# Patient Record
Sex: Male | Born: 1950
Health system: Southern US, Community
[De-identification: ages and names within clinical notes are randomized; demographics above are authoritative.]

## PROBLEM LIST (undated history)

## (undated) DIAGNOSIS — K922 Gastrointestinal hemorrhage, unspecified: Secondary | ICD-10-CM

## (undated) DIAGNOSIS — I4891 Unspecified atrial fibrillation: Secondary | ICD-10-CM

## (undated) DIAGNOSIS — R42 Dizziness and giddiness: Secondary | ICD-10-CM

## (undated) DIAGNOSIS — D62 Acute posthemorrhagic anemia: Secondary | ICD-10-CM

## (undated) DIAGNOSIS — E7800 Pure hypercholesterolemia, unspecified: Secondary | ICD-10-CM

## (undated) DIAGNOSIS — D239 Other benign neoplasm of skin, unspecified: Secondary | ICD-10-CM

## (undated) DIAGNOSIS — M069 Rheumatoid arthritis, unspecified: Secondary | ICD-10-CM

## (undated) DIAGNOSIS — K219 Gastro-esophageal reflux disease without esophagitis: Secondary | ICD-10-CM

## (undated) DIAGNOSIS — R55 Syncope and collapse: Secondary | ICD-10-CM

## (undated) DIAGNOSIS — K5730 Diverticulosis of large intestine without perforation or abscess without bleeding: Secondary | ICD-10-CM

## (undated) HISTORY — DX: Syncope and collapse: R55

## (undated) HISTORY — DX: Pure hypercholesterolemia, unspecified: E78.00

## (undated) HISTORY — PX: TONSILLECTOMY AND ADENOIDECTOMY: SHX28

## (undated) HISTORY — DX: Acute posthemorrhagic anemia: D62

## (undated) HISTORY — DX: Other benign neoplasm of skin, unspecified: D23.9

## (undated) HISTORY — PX: OTHER SURGICAL HISTORY: SHX169

## (undated) HISTORY — DX: Dizziness and giddiness: R42

## (undated) HISTORY — DX: Diverticulosis of large intestine without perforation or abscess without bleeding: K57.30

---
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
## 1998-04-11 ENCOUNTER — Encounter: Admission: RE | Admit: 1998-04-11 | Discharge: 1998-04-11 | Payer: Self-pay | Admitting: Family Medicine

## 1999-06-01 ENCOUNTER — Encounter: Admission: RE | Admit: 1999-06-01 | Discharge: 1999-06-01 | Payer: Self-pay | Admitting: Family Medicine

## 2000-07-04 ENCOUNTER — Encounter: Admission: RE | Admit: 2000-07-04 | Discharge: 2000-07-04 | Payer: Self-pay | Admitting: Family Medicine

## 2001-05-15 ENCOUNTER — Encounter: Admission: RE | Admit: 2001-05-15 | Discharge: 2001-05-15 | Payer: Self-pay | Admitting: Family Medicine

## 2001-10-09 ENCOUNTER — Encounter: Admission: RE | Admit: 2001-10-09 | Discharge: 2001-10-09 | Payer: Self-pay | Admitting: Family Medicine

## 2001-12-08 ENCOUNTER — Encounter: Admission: RE | Admit: 2001-12-08 | Discharge: 2001-12-08 | Payer: Self-pay | Admitting: Family Medicine

## 2001-12-13 ENCOUNTER — Encounter: Admission: RE | Admit: 2001-12-13 | Discharge: 2001-12-13 | Payer: Self-pay | Admitting: Otolaryngology

## 2001-12-13 ENCOUNTER — Encounter: Payer: Self-pay | Admitting: Otolaryngology

## 2002-12-24 ENCOUNTER — Encounter: Admission: RE | Admit: 2002-12-24 | Discharge: 2002-12-24 | Payer: Self-pay | Admitting: Family Medicine

## 2002-12-31 ENCOUNTER — Encounter: Admission: RE | Admit: 2002-12-31 | Discharge: 2002-12-31 | Payer: Self-pay | Admitting: Family Medicine

## 2004-02-07 ENCOUNTER — Ambulatory Visit: Payer: Self-pay | Admitting: Family Medicine

## 2004-04-27 ENCOUNTER — Ambulatory Visit: Payer: Self-pay | Admitting: Sports Medicine

## 2004-06-08 ENCOUNTER — Encounter (INDEPENDENT_AMBULATORY_CARE_PROVIDER_SITE_OTHER): Payer: Self-pay | Admitting: Specialist

## 2004-06-08 ENCOUNTER — Ambulatory Visit (HOSPITAL_COMMUNITY): Admission: RE | Admit: 2004-06-08 | Discharge: 2004-06-08 | Payer: Self-pay | Admitting: Gastroenterology

## 2007-06-21 ENCOUNTER — Ambulatory Visit: Payer: Self-pay | Admitting: Pulmonary Disease

## 2007-06-21 DIAGNOSIS — B2790 Infectious mononucleosis, unspecified without complication: Secondary | ICD-10-CM | POA: Insufficient documentation

## 2007-06-21 DIAGNOSIS — K5730 Diverticulosis of large intestine without perforation or abscess without bleeding: Secondary | ICD-10-CM | POA: Insufficient documentation

## 2007-06-21 DIAGNOSIS — D239 Other benign neoplasm of skin, unspecified: Secondary | ICD-10-CM | POA: Insufficient documentation

## 2007-06-21 DIAGNOSIS — K219 Gastro-esophageal reflux disease without esophagitis: Secondary | ICD-10-CM | POA: Insufficient documentation

## 2007-06-21 LAB — CONVERTED CEMR LAB
AST: 21 units/L (ref 0–37)
Albumin: 4.2 g/dL (ref 3.5–5.2)
Alkaline Phosphatase: 55 units/L (ref 39–117)
BUN: 12 mg/dL (ref 6–23)
Bacteria, UA: NEGATIVE
Basophils Relative: 0.7 % (ref 0.0–1.0)
Bilirubin Urine: NEGATIVE
CO2: 31 meq/L (ref 19–32)
Chloride: 104 meq/L (ref 96–112)
Creatinine, Ser: 1.1 mg/dL (ref 0.4–1.5)
Crystals: NEGATIVE
Eosinophils Relative: 2 % (ref 0.0–5.0)
HCT: 43.8 % (ref 39.0–52.0)
HDL: 34.4 mg/dL — ABNORMAL LOW (ref >39.0)
Hemoglobin: 14.9 g/dL (ref 13.0–17.0)
LDL Cholesterol: 138 mg/dL — ABNORMAL HIGH (ref 0–99)
Lymphocytes Relative: 21 % (ref 12.0–46.0)
Monocytes Absolute: 0.6 10*3/uL (ref 0.2–0.7)
Mucus, UA: NEGATIVE
Neutro Abs: 4.9 10*3/uL (ref 1.4–7.7)
Neutrophils Relative %: 68.5 % (ref 43.0–77.0)
Nitrite: NEGATIVE
Potassium: 4.2 meq/L (ref 3.5–5.1)
RDW: 14.4 % (ref 11.5–14.6)
Sodium: 140 meq/L (ref 135–145)
Specific Gravity, Urine: 1.02 (ref 1.000–1.03)
Total Bilirubin: 1 mg/dL (ref 0.3–1.2)
Total Protein: 6.4 g/dL (ref 6.0–8.3)
Urine Glucose: NEGATIVE mg/dL
Urobilinogen, UA: 0.2 (ref 0.0–1.0)
VLDL: 25 mg/dL (ref 0–40)
WBC: 7.2 10*3/uL (ref 4.5–10.5)

## 2007-10-10 ENCOUNTER — Telehealth (INDEPENDENT_AMBULATORY_CARE_PROVIDER_SITE_OTHER): Payer: Self-pay | Admitting: *Deleted

## 2007-10-11 ENCOUNTER — Ambulatory Visit: Payer: Self-pay | Admitting: Internal Medicine

## 2007-10-11 DIAGNOSIS — L259 Unspecified contact dermatitis, unspecified cause: Secondary | ICD-10-CM | POA: Insufficient documentation

## 2008-10-04 ENCOUNTER — Telehealth: Payer: Self-pay | Admitting: *Deleted

## 2008-10-04 ENCOUNTER — Telehealth: Payer: Self-pay | Admitting: Pulmonary Disease

## 2008-10-24 ENCOUNTER — Ambulatory Visit: Payer: Self-pay | Admitting: Pulmonary Disease

## 2008-10-24 DIAGNOSIS — E785 Hyperlipidemia, unspecified: Secondary | ICD-10-CM | POA: Insufficient documentation

## 2008-10-24 DIAGNOSIS — E7800 Pure hypercholesterolemia, unspecified: Secondary | ICD-10-CM

## 2008-10-26 LAB — CONVERTED CEMR LAB
ALT: 22 units/L (ref 0–53)
AST: 20 units/L (ref 0–37)
Albumin: 4 g/dL (ref 3.5–5.2)
Alkaline Phosphatase: 56 units/L (ref 39–117)
Basophils Absolute: 0 10*3/uL (ref 0.0–0.1)
Basophils Relative: 0.3 % (ref 0.0–3.0)
Calcium: 9.3 mg/dL (ref 8.4–10.5)
Eosinophils Relative: 2.2 % (ref 0.0–5.0)
GFR calc non Af Amer: 81.77 mL/min (ref >60)
HCT: 40.3 % (ref 39.0–52.0)
HDL: 39.5 mg/dL (ref >39.00)
Hemoglobin, Urine: NEGATIVE
Hemoglobin: 14.2 g/dL (ref 13.0–17.0)
Ketones, ur: NEGATIVE mg/dL
LDL Cholesterol: 111 mg/dL — ABNORMAL HIGH (ref 0–99)
Lymphocytes Relative: 24.9 % (ref 12.0–46.0)
Lymphs Abs: 1.6 10*3/uL (ref 0.7–4.0)
Monocytes Relative: 8 % (ref 3.0–12.0)
Neutro Abs: 4.1 10*3/uL (ref 1.4–7.7)
Potassium: 4.4 meq/L (ref 3.5–5.1)
RBC: 4.84 M/uL (ref 4.22–5.81)
RDW: 14.5 % (ref 11.5–14.6)
Sodium: 142 meq/L (ref 135–145)
Total CHOL/HDL Ratio: 4
Total Protein, Urine: NEGATIVE mg/dL
Urine Glucose: NEGATIVE mg/dL
VLDL: 19.2 mg/dL (ref 0.0–40.0)
WBC: 6.3 10*3/uL (ref 4.5–10.5)

## 2009-04-22 ENCOUNTER — Ambulatory Visit: Payer: Self-pay | Admitting: Pulmonary Disease

## 2009-04-22 DIAGNOSIS — J309 Allergic rhinitis, unspecified: Secondary | ICD-10-CM | POA: Insufficient documentation

## 2009-05-24 HISTORY — PX: LAPAROSCOPIC CHOLECYSTECTOMY: SUR755

## 2009-05-29 ENCOUNTER — Telehealth (INDEPENDENT_AMBULATORY_CARE_PROVIDER_SITE_OTHER): Payer: Self-pay | Admitting: *Deleted

## 2009-09-09 ENCOUNTER — Telehealth (INDEPENDENT_AMBULATORY_CARE_PROVIDER_SITE_OTHER): Payer: Self-pay | Admitting: *Deleted

## 2009-09-10 ENCOUNTER — Telehealth (INDEPENDENT_AMBULATORY_CARE_PROVIDER_SITE_OTHER): Payer: Self-pay | Admitting: *Deleted

## 2009-09-10 ENCOUNTER — Ambulatory Visit: Payer: Self-pay | Admitting: Internal Medicine

## 2009-09-15 LAB — CONVERTED CEMR LAB
Basophils Absolute: 0.1 10*3/uL (ref 0.0–0.1)
Bilirubin, Direct: 0.4 mg/dL — ABNORMAL HIGH (ref 0.0–0.3)
Calcium: 8.9 mg/dL (ref 8.4–10.5)
Creatinine, Ser: 1.1 mg/dL (ref 0.4–1.5)
Eosinophils Absolute: 0 10*3/uL (ref 0.0–0.7)
HCT: 42.2 % (ref 39.0–52.0)
Hemoglobin: 14.8 g/dL (ref 13.0–17.0)
Lymphs Abs: 0.7 10*3/uL (ref 0.7–4.0)
MCHC: 35 g/dL (ref 30.0–36.0)
MCV: 83.3 fL (ref 78.0–100.0)
Monocytes Absolute: 0.7 10*3/uL (ref 0.1–1.0)
Neutro Abs: 8.6 10*3/uL — ABNORMAL HIGH (ref 1.4–7.7)
Nitrite: POSITIVE
RDW: 15.7 % — ABNORMAL HIGH (ref 11.5–14.6)
Specific Gravity, Urine: >=1.03 (ref 1.000–1.030)
Total Bilirubin: 1.4 mg/dL — ABNORMAL HIGH (ref 0.3–1.2)
Total Protein, Urine: 30 mg/dL
Total Protein: 6.3 g/dL (ref 6.0–8.3)
Urine Glucose: NEGATIVE mg/dL
Urobilinogen, UA: 4 (ref 0.0–1.0)

## 2009-11-13 ENCOUNTER — Ambulatory Visit: Payer: Self-pay | Admitting: Pulmonary Disease

## 2009-11-15 LAB — CONVERTED CEMR LAB
ALT: 27 units/L (ref 0–53)
AST: 20 units/L (ref 0–37)
Albumin: 4.2 g/dL (ref 3.5–5.2)
BUN: 14 mg/dL (ref 6–23)
Basophils Relative: 0.8 % (ref 0.0–3.0)
Chloride: 105 meq/L (ref 96–112)
Cholesterol: 184 mg/dL (ref 0–200)
Creatinine, Ser: 1 mg/dL (ref 0.4–1.5)
Eosinophils Relative: 2 % (ref 0.0–5.0)
Glucose, Bld: 89 mg/dL (ref 70–99)
HDL: 43.4 mg/dL (ref >39.00)
LDL Cholesterol: 120 mg/dL — ABNORMAL HIGH (ref 0–99)
Lymphocytes Relative: 23.4 % (ref 12.0–46.0)
MCV: 84.4 fL (ref 78.0–100.0)
Monocytes Relative: 7.2 % (ref 3.0–12.0)
Neutrophils Relative %: 66.6 % (ref 43.0–77.0)
Platelets: 210 10*3/uL (ref 150.0–400.0)
RBC: 5.01 M/uL (ref 4.22–5.81)
Total Bilirubin: 0.9 mg/dL (ref 0.3–1.2)
Total CHOL/HDL Ratio: 4
Total Protein: 6.7 g/dL (ref 6.0–8.3)
Triglycerides: 104 mg/dL (ref 0.0–149.0)
VLDL: 20.8 mg/dL (ref 0.0–40.0)
WBC: 7.1 10*3/uL (ref 4.5–10.5)

## 2009-12-05 ENCOUNTER — Inpatient Hospital Stay (HOSPITAL_COMMUNITY): Admission: EM | Admit: 2009-12-05 | Discharge: 2009-12-09 | Payer: Self-pay | Admitting: Emergency Medicine

## 2009-12-07 ENCOUNTER — Encounter (INDEPENDENT_AMBULATORY_CARE_PROVIDER_SITE_OTHER): Payer: Self-pay

## 2009-12-11 ENCOUNTER — Encounter: Payer: Self-pay | Admitting: Pulmonary Disease

## 2010-02-12 ENCOUNTER — Encounter: Payer: Self-pay | Admitting: Family Medicine

## 2010-03-19 ENCOUNTER — Telehealth: Payer: Self-pay | Admitting: Pulmonary Disease

## 2010-03-20 ENCOUNTER — Ambulatory Visit: Payer: Self-pay | Admitting: Pulmonary Disease

## 2010-03-24 LAB — CONVERTED CEMR LAB
Basophils Relative: 0.6 % (ref 0.0–3.0)
CRP, High Sensitivity: 15.13 — ABNORMAL HIGH (ref 0.00–5.00)
Cyclic Citrullin Peptide Ab: 51.4 units — ABNORMAL HIGH (ref 0.0–5.0)
Eosinophils Absolute: 0.2 10*3/uL (ref 0.0–0.7)
Eosinophils Relative: 3.1 % (ref 0.0–5.0)
HCT: 39.1 % (ref 39.0–52.0)
Lymphs Abs: 1.4 10*3/uL (ref 0.7–4.0)
MCHC: 35 g/dL (ref 30.0–36.0)
MCV: 83.1 fL (ref 78.0–100.0)
Monocytes Absolute: 0.4 10*3/uL (ref 0.1–1.0)
Neutrophils Relative %: 69.5 % (ref 43.0–77.0)
Platelets: 226 10*3/uL (ref 150.0–400.0)
Sed Rate: 8 mm/hr (ref 0–22)
WBC: 6.9 10*3/uL (ref 4.5–10.5)

## 2010-03-25 ENCOUNTER — Encounter: Payer: Self-pay | Admitting: Pulmonary Disease

## 2010-03-30 ENCOUNTER — Encounter: Payer: Self-pay | Admitting: Family Medicine

## 2010-03-31 ENCOUNTER — Encounter: Payer: Self-pay | Admitting: Pulmonary Disease

## 2010-04-06 ENCOUNTER — Encounter: Payer: Self-pay | Admitting: Pulmonary Disease

## 2010-06-25 NOTE — Procedures (Signed)
Summary: Colonoscopy/[facility]  Colonoscopy/[facility]   Imported By: Sherian Rein 04/04/2010 11:06:16  _____________________________________________________________________  External Attachment:    Type:   Image     Comment:   External Document

## 2010-06-25 NOTE — Assessment & Plan Note (Signed)
Summary: add on for jaw and hand pain x 5 days/la   Primary Care Provider:  Dr. Alroy Dust  CC:  8month ROV & add-on for jaw & hand pain....  History of Present Illness: 60 y/o WM, husb of [employer], here for an add-on visit due to acute pain in jaw & hands...   ~  Jun10:  last seen for a new pt CPX in Jan09- EMR note reviewed... doing well overall & only recent prob was suprapubic discomfort ? UTI Rx'd w/ Cipro called in and resolved, but he didn't have dysuria, fever, hematuria, etc (we will check UA)... we discussed risk factors of +FamHx, Smoking, Hyperchol and the need to quit smoking and consider Statin Rx...   ~  November 13, 2009:  he has had a good year & is on no perscription meds... unfortunately he continues to smoke  ~1/2ppd and is not motivated to quit... we discussed options & offered Chantix Rx which he says he will try... CXR, EKG, & labs all look good...   ~  March 20, 2010:  Add-on after experiencing acute jaw pain 4d ago- hurt to chew etc... resolved spont & "moved to my right hand" w/ pain (the whole hand he says) and decr grip, then moved to left hand... notes swelling in MCPs & tried Tylenol & 4Ibuprofen- pain wax & waned... note> mother had RA, but he hasn't had much in the way of joint complaints prev... we discussed further eval w/ XRays of hands (neg), & blood work (Sed=8, CRP hi at 15, RA +low liter 24, Anti-CCP=51, ANA=neg  )... Rx w/ Dosepak & Mobic pending results & prob Rheum consultation...   He had acute cholecystitis 7/11 & [facility]> s/p lap chole by [name] w/ gallstones & purulent fluid around GB... saw DrNatt-Mann 9/11 for GI f/u and colonoscopy is sched 11/11... he says he quit smoking 6/11...    Current Problems:   CIGARETTE SMOKER (ICD-305.1) - started as teen, up to 1/2 ppd, quit for up to 50yrs, recently 1/2 ppd but he says he quit 6/11... no hx resp tract diseases...   ~  baseline CXRs showed clear, NAD.[name].  ~  CXR 6/11 remains clear,  WNL, NAD...  HYPERCHOLESTEROLEMIA, BORDERLINE (ICD-272.4) - currently on diet Rx alone.  ~  FLP 1/09 showed TChol 198, TG 127, HDL 34, LDL 138  ~  FLP 6/10 showed TChol 170, TG 96, HDL 40, LDL 111  ~  FLP 6/11 showed TChol 184, TG 104, HDL 43, LDL 120... rec low chol/ low fat diet.  DYSPEPSIA (ICD-536.8) - takes PRILOSEC OTC daily... no dysphagia, N/V, abd pain, etc... no prev endo etc... states he really needs it daily & has pain/ difficulty if he stops the Rx.  DIVERTICULOSIS OF COLON (ICD-562.10) - had colonoscopy 1/06 from DrMann= divertics, tiny polyp (path=norm mucosa), hems... f/u planned 11/11...  Hx of MONONUCLEOSIS (ICD-075) - in high school... had mono hepatitis... no known sequellae... he also had RMSF at age 74- pretty sick, recoved w/o sequellae.  ~  labs 1/09 showed normal LFTs  ~  labs 6/10 showed normal LFTs  PROBABLE RHEUMATOID ARTHRITIS >> see above  Hx of DYSPLASTIC NEVUS (ICD-216.9) - removed from base of neck/ upper back by DrHensel/ DrCrowe in 1993...  HEALTH MAINTENANCE:  on ASA 81mg  daily...  ~  GI:  colonoscopy 1/06 by [facility] w/ f/u due later this yr.  ~  GU: neg DRE & PSA here 1/09 & 6/11  ~  Immunizations:  age 17 now> smoker- given PNEUMOVAX 6/10;  given TETANUS shot 6/10; discussed yearly FLU vaccines.   Preventive Screening-Counseling & Management  Alcohol-Tobacco     Smoking Status: quit     Year Quit: 07/2009  Allergies: 1)  ! Penicillin  Comments:  Nurse/Medical Assistant: The patient's medications and allergies were reviewed with the patient and were updated in the Medication and Allergy Lists.  Past History:  Past Medical History: ALLERGIC RHINITIS (ICD-477.9) DERMATITIS (ICD-692.9) PHYSICAL EXAMINATION (ICD-V70.0) Hx of CIGARETTE SMOKER (ICD-305.1) HYPERCHOLESTEROLEMIA, BORDERLINE (ICD-272.4) DYSPEPSIA (ICD-536.8) DIVERTICULOSIS OF COLON (ICD-562.10) Hx of MONONUCLEOSIS (ICD-075) ARTHRITIS, ACUTE (ICD-716.90) Hx of DYSPLASTIC  NEVUS (ICD-216.9)  Family History: Reviewed history from 10/24/2008 and no changes required. Father alive age 86 w/ prostate ca, has pacer. Mother died age 79 w/ heart disease, vasc disease No siblings...  Social History: Reviewed history from 10/24/2008 and no changes required. Married, wife Larita Fife, [place] yrs 2 Children: one son w/ HBP, one son w/ IDDM +Smoker-1/2 ppd  Social Etoh Employ: [employer]... may retire soon Smoking Status:  quit  Review of Systems      See HPI  The patient denies anorexia, fever, weight loss, weight gain, vision loss, decreased hearing, hoarseness, chest pain, syncope, dyspnea on exertion, peripheral edema, prolonged cough, headaches, hemoptysis, abdominal pain, melena, hematochezia, severe indigestion/heartburn, hematuria, incontinence, muscle weakness, suspicious skin lesions, transient blindness, difficulty walking, depression, unusual weight change, abnormal bleeding, enlarged lymph nodes, and angioedema.   MS:  Complains of joint pain, joint swelling, loss of strength, and stiffness; denies joint redness, low back pain, mid back pain, muscle aches, cramps, and thoracic pain.  Vital Signs:  Patient profile:   60 year old male Height:      71 inches Weight:      240.25 pounds BMI:     33.63 O2 Sat:      98 % on Room air Temp:     97.6 degrees F oral Pulse rate:   70 / minute BP sitting:   122 / 82  (left arm) Cuff size:   regular  Vitals Entered By: Randell Loop CMA (March 20, 2010 11:17 AM)  O2 Sat at Rest %:  98 O2 Flow:  Room air CC: 51month ROV & add-on for jaw & hand pain... Is Patient Diabetic? No Pain Assessment Patient in pain? yes      Onset of pain  hand pain into wrists Comments meds updated today with pt   Physical Exam  Additional Exam:  WD, Overweight, 60 y/o WM in NAD... GENERAL:  Alert & oriented; pleasant & cooperative... HEENT:  /AT, EOM-wnl, PERRLA, Fundi-benign, EACs-clear, TMs-wnl, NOSE-clear, THROAT-clear &  wnl. NECK:  Supple w/ full ROM; no JVD; normal carotid impulses w/o bruits; no thyromegaly or nodules palpated; no lymphadenopathy. CHEST:  Clear to P & A; without wheezes/ rales/ or rhonchi. HEART:  Regular Rhythm; without murmurs/ rubs/ or gallops. ABDOMEN:  Soft & nontender; s/p lap chole, normal bowel sounds; no organomegaly or masses detected. (RECTAL:  Neg - prostate 2+ & nontender w/o nodules; stool hematest neg) EXT: deformity L great toe, sl swelling & tender MCPs, some IPs, & wrist; no varicose veins/ venous insuffic/ or edema. NEURO:  CN's intact; motor testing normal; sensory testing normal; gait normal & balance OK. DERM:  scar in upper back at base of neck toward R side, no new lesions noted...    X-ray Musculoskeletal  Procedure date:  03/20/2010  Findings:      RIGHT & LEFT HANDs - COMPLETE  Findings: Normal bony mineralization.  Normal alignment.  No acute or healing fracture, bony erosion, joint space abnormality, focal soft tissue swelling or abnormal soft tissue calcification is identified.   IMPRESSION: No acute bony abnormality or joint space abnormality.   Read By:  Oliver Hum,  M.D.   X-ray Musculoskeletal  Procedure date:  03/20/2010  Findings:      CBC Platelet w/Diff (CBCD)   White Cell Count          6.9 K/uL                    4.5-10.5   Red Cell Count            4.70 Mil/uL                 4.22-5.81   Hemoglobin                13.7 g/dL                   30.8-65.7   Hematocrit                39.1 %                      39.0-52.0   MCV                       83.1 fl                     78.0-100.0   Platelet Count            226.0 K/uL                  150.0-400.0   Neutrophil %              69.5 %                      43.0-77.0   Lymphocyte %              20.3 %                      12.0-46.0   Monocyte %                6.5 %                       3.0-12.0   Eosinophils%              3.1 %                       0.0-5.0   Basophils %                0.6 %                       0.0-3.0  Sed Rate (ESR)   Sed Rate                  8 mm/hr                     0-22  Full Range CRP (FCRP)   CRPH                 [H]  15.13 mg/L  0.00-5.00  Comments:      Rheumatoid (RA) Factor (40981)  Rheumatoid (RA) Factor                        [H]  24 IU/mL                    0-20  Anti Nuclear Antibody (ANA) Reflex (23900)  Anti Nuclear Antibody (ANA)                             NEG                         NEGATIVE  RPR Reflex to T.pallidum Ab, Total (23940)   RPR                       NON REAC                    NON REAC  Cyclic Citrullinated Peptide Ab,IgG (82255)  Cyclic Citrul Pep Ab, IgG                        [H]  51.4 U/mL                   0.0-5.0   Impression & Recommendations:  Problem # 1:  ARTHRITIS, ACUTE (ICD-716.90) Fairly sudden acute arthritic pain>  XRays neg, inflamm markers show incr CRP, norm sed, low titer RF, and +Anti-CCP all suggesting RA... rec Rx w/ Pred dosepak, Mobic Prn, and refer to Rheum for dis modifying Rx...  Orders: T-Hand Left 3 Views (73130TC) T-Hand Right 3 views (73130TC) TLB-CBC Platelet - w/Differential (85025-CBCD) TLB-Sedimentation Rate (ESR) (85652-ESR) T-Antinuclear Antib (ANA) [id number]) T-Rheumatoid Factor [phone]) T-RPR (Syphilis) [phone]) T- * Misc. Laboratory test [phone]) TLB-CRP-High Sensitivity (C-Reactive Protein) (86140-FCRP)  Problem # 2:  Hx of CIGARETTE SMOKER (ICD-305.1) Says he quit smoking 6/11>  congrats... The following medications were removed from the medication list:    Chantix Starting Month Pak 0.5 Mg X 11 & 1 Mg X 42 Tabs (Varenicline tartrate) ..... Use as directed...    Chantix Continuing Month Pak 1 Mg Tabs (Varenicline tartrate) ..... Use as directed...  Problem # 3:  HYPERCHOLESTEROLEMIA, BORDERLINE (ICD-272.4) We reviewed low chol, low fat diet...  Problem # 4:  ABDOMINAL PAIN, UNSPECIFIED (ICD-789.00) He is s/p lap  chole by [name] 7/11 & sched for colonoscopy 11/11 DrNat-Mann.  Problem # 5:  OTHER MEDICAL ISSUES AS NOTED>>>  Complete Medication List: 1)  Aspirin 81 Mg Tbec (Aspirin) .... Take 1 tab by mouth once daily.Marland KitchenMarland Kitchen 2)  Prilosec Otc 20 Mg Tbec (Omeprazole magnesium) .... Take  one 30-60 min before first meal of the day 3)  Multivitamins Tabs (Multiple vitamin) .... Take 1 tablet by mouth once a day 4)  Vitamin C 500 Mg Tabs (Ascorbic acid) .... Take 2 tablets by mouth once daily 5)  Ibuprofen 200 Mg Caps (Ibuprofen) .... As needed for pain 6)  Glucosamine 500 Mg Caps (Glucosamine sulfate) .... Take 2 capsules by mouth once daily 7)  Meloxicam 15 Mg Tabs (Meloxicam) .... Take 1 tab by mouth once daily w/ a meal for arthritis pain.Marland KitchenMarland Kitchen 8)  Prednisone (pak) 5 Mg Tabs (Prednisone) .... Take as directed til gone...  Patient Instructions: 1)  Today we updated your med list- see below.... 2)  I want you to take the Sterapred dosepak over the next week for the acute inflammation.Marland KitchenMarland Kitchen  3)  You may also use the Ibuprofen or the new MELOXICAM (Mobic) once dailt for arthritis pain.Marland KitchenMarland Kitchen 4)  You may use hot soaks etc as needed... 5)  Today we did a number of arthitis tests- XRays of your hands and blood work... please call the "phone tree" in a few days for your lab results.Marland KitchenMarland Kitchen 6)  If there is lab proof of rheumatoid disease we will send you to a specialist for disease modifying therapy... Prescriptions: PREDNISONE (PAK) 5 MG TABS (PREDNISONE) take as directed til gone...  #5mg  6d pack x 0   Entered and Authorized by:   Michele Mcalpine MD   Signed by:   Michele Mcalpine MD on 03/20/2010   Method used:   Print then Give to Patient   RxID:   [id number] MELOXICAM 15 MG TABS (MELOXICAM) take 1 tab by mouth once daily w/ a meal for arthritis pain...  #30 x 6   Entered and Authorized by:   Michele Mcalpine MD   Signed by:   Michele Mcalpine MD on 03/20/2010   Method used:   Print then Give to Patient   RxID:    [id number]

## 2010-06-25 NOTE — Miscellaneous (Signed)
Summary: Orders Update  Clinical Lists Changes  Orders: Added new Referral order of Rheumatology Referral (Rheumatology) - Signed 

## 2010-06-25 NOTE — Progress Notes (Signed)
Summary: fever  Phone Note Call from Patient   Caller: Patient Call For: nadel Summary of Call: pt have fever with no congestion or cough. last reading was 101 Initial call taken by: Rickard Patience,  September 09, 2009 3:43 PM  Follow-up for Phone Call        Spoke with pt.  He states that that he is "getting over stomach bug" x several days- had "severe pressure" after eating, denied having any fever, NVD.  He c/o fever today, highest reading was 101. Pt has been taking tylenol, "not sure if fever is down".  No appts available.  Would like recs per SN.  Please advise thanks Follow-up by: Vernie Murders,  September 09, 2009 3:58 PM  Additional Follow-up for Phone Call Additional follow up Details #1::        per TP---ov with TP this week if not improving---advance bland diet--clear liquids to start   small amounts and portions.  increase fluids as tolerated---ER for eval if worse .  thanks Randell Loop CMA  September 09, 2009 5:08 PM     Additional Follow-up for Phone Call Additional follow up Details #2::    Spoke with pt and notified of recs per TP.  Pt verbalized understanding. Follow-up by: Vernie Murders,  September 09, 2009 5:14 PM

## 2010-06-25 NOTE — Progress Notes (Signed)
Summary: cough/ congestion  Phone Note Call from Patient Call back at [phone]   Caller: Spouse Call For: nadel Summary of Call: bodyache fever 102 head congestion nausea pharmacy [facility] [place] Initial call taken by: Rickard Patience,  May 29, 2009 9:06 AM  Follow-up for Phone Call        symptoms started on "sunday, achy all over, no cough, head congestion, head feels tight, sneezing, fever up to 102.0 using tylenol but only helps for a short while and still has some fever with the tylenol.  pls advise Follow-up by: Tammy Davis CMA,  May 29, 2009 9:24 AM  Additional Follow-up for Phone Call Additional follow up Details #1::        per SN---zpak  #1  and use mucinex max 1200mg daily 1 by mouth two times a day with plenty of fluids and use nasal saline spray every 1-2 hours as needed while awake.  thanks Leigh Adkins CMA  May 29, 2009 10:00 AM   pt's wife aware of recs. z-pak sent to pharmacy Additional Follow-up by: Tammy Davis CMA,  May 29, 2009 10:05 AM    New/Updated Medications: ZITHROMAX Z-PAK 250 MG TABS (AZITHROMYCIN) take as directed Prescriptions: ZITHROMAX Z-PAK 250 MG TABS (AZITHROMYCIN) take as directed  #1 x 0   Entered by:   Tammy Davis CMA   Authorized by:   Scott M Nadel MD   Signed by:   Tammy Davis CMA on 05/29/2009   Method used:   Electronically to        [facility]  [address] (retail)       [address]       [place], [place]  [address]       Ph: [phone]       Fax: [phone]   RxID:   [id number]

## 2010-06-25 NOTE — Consult Note (Signed)
Summary: [facility]  [facility]   Imported By: Clydell Hakim 02/18/2010 11:05:29  _____________________________________________________________________  External Attachment:    Type:   Image     Comment:   External Document

## 2010-06-25 NOTE — Letter (Signed)
Summary: [facility]   Imported By: Sherian Rein 04/27/2010 08:27:56  _____________________________________________________________________  External Attachment:    Type:   Image     Comment:   External Document

## 2010-06-25 NOTE — Progress Notes (Signed)
Summary: jaw pain and swollen hands  Phone Note Call from Patient Call back at [phone]   Caller: Spouse-Lynn Call For: nadel Summary of Call: pt having jaw pain. hands hurting and swelling  Initial call taken by: Rickard Patience,  March 19, 2010 2:40 PM  Follow-up for Phone Call        Spoke with pt's spouse Larita Fife.  She states that pt had severe jaw pain 4 days ago, painful to chew or open up mouth x 2 days.  After jaw pain improved he started to have pain in his rt hand,and was unable to grip anything in this hand due to pain.  She states pain stopped a day later and now is in left hand.  She states that all he had tried is aleve with no relief.  No appts available.  Pls advise, thanks! Follow-up by: Vernie Murders,  March 19, 2010 3:38 PM  Additional Follow-up for Phone Call Additional follow up Details #1::        called and spoke with pts wife lynn and she is aware of appt for 11-28 at 11am.   Randell Loop CMA  March 19, 2010 5:16 PM

## 2010-06-25 NOTE — Miscellaneous (Signed)
Summary: [facility]  [facility]   Imported By: Lester Denton 05/14/2010 11:12:59  _____________________________________________________________________  External Attachment:    Type:   Image     Comment:   External Document

## 2010-06-25 NOTE — Assessment & Plan Note (Signed)
Summary: Primary svc/ fever and abd pain ? etiology with elevated lft's   Primary Provider/Referring Provider:  Dr. Alroy Dust  CC:  Acute visit.  Pt c/o fever x 3 days- highest reading was 102.  States that prior to having the fever he was having abdominal pressure x several days- especially after eating.  Pt also c/o dark color urine x 2 days and slight burning on urination.  [name]  History of Present Illness: 19 yowm  with history of GERD, Hyperlipidemia, dyspepsia, smoker.   -- CPX... last seen for a new pt CPX in Jan09- EMR note reviewed... doing well overall & only recent prob was suprapubic discomfort ? UTI Rx'd w/ Cipro called in and resolved, but he didn't have dysuria, fever, hematuria, etc (we will check UA)... we discussed risk factors of +FamHx, Smoking, Hyperchol and the need to quit smoking and consider Statin Rx...  September 10, 2009 cc around 10 days diffuse  bilateral upper abd pain no worse left vs right initially also  lost appetite with  chills then by 7 days  some better except after eat >  recurrent " pressure " type pain then 2 days ago after [employer]  barbecue severe pain same area with shaking chill and temp to 102 assoc with discolored urine and mild dysuria.  Pt denies any significant sore throat, dysphagia, itching, sneezing,  nasal congestion or excess secretions,  sweats, unintended wt loss, pleuritic or exertional cp, hempoptysis, change in activity tolerance  orthopnea pnd or leg swelling.  No diarrhea or change in bm's.  no vomiting.  Current Medications (verified): 1)  Aspirin 81 Mg Tbec (Aspirin) .... Take 1 Tab By Mouth Once Daily.Marland KitchenMarland Kitchen 2)  Prilosec Otc 20 Mg  Tbec (Omeprazole Magnesium) .... Take 1 Tablet By Mouth Once A Day 3)  Multivitamins   Tabs (Multiple Vitamin) .... Take 1 Tablet By Mouth Once A Day 4)  Advil Cold/sinus 30-200 Mg Tabs (Pseudoephedrine-Ibuprofen) .... Per Package 5)  Tylenol Extra Strength 500 Mg Tabs (Acetaminophen) .... Per Bottle Instructions  As Needed  Allergies (verified): 1)  ! Penicillin  Past History:  Past Medical History: CIGARETTE SMOKER (ICD-305.1) - started as teen, up to 1/2 ppd, quit for up to 15yrs, currently 1/4 to 1/2 ppd... no hx resp tract diseases...   ~  CXR 1/09 showed clear, NAD.[name].  ~  CXR 6/10 showed = neg  HYPERCHOLESTEROLEMIA, BORDERLINE (ICD-272.4) - currently on diet Rx alone.  ~  FLP 1/09 showed TChol 198, TG 127, HDL 34, LDL 138   DYSPEPSIA (ICD-536.8) - takes PRILOSEC OTC daily... no dysphagia, N/V, abd pain, etc... no prev endo etc... states he really needs it daily & has pain/ difficulty if he stops the Rx.  DIVERTICULOSIS OF COLON (ICD-562.10) - had colonoscopy 1/06 from DrMann= divertics, tiny polyp (path=norm mucosa), hems... f/u planned 10 yrs.  Hx of MONONUCLEOSIS (ICD-075) - in high school... had mono hepatitis... no known sequellae... he also had RMSF at age 48- pretty sick, recoved w/o sequellae.  ~  labs 1/09 showed normal LFT's...  ~  labs 6/10 showed =  Hx of DYSPLASTIC NEVUS (ICD-216.9) - removed from base of neck/ upper back by DrHensel/ DrCrowe in 1993...  HEALTH MAINTENANCE:  on ASA 81mg  daily...  ~  GI:  colonoscopy 1/06 by Dr Loreta Ave  ~  GU: neg DRE & PSA here 1/09...  ~  Immunizations:  age 2 now> smoker- rec PNEUMOVAX; needs 61yr TETANUS shot; discussed yearly FLU vaccines.  Vital Signs:  Patient profile:   60 year old male Weight:      232.13 pounds O2 Sat:      95 % on Room air Temp:     97.8 degrees F oral Pulse rate:   88 / minute BP sitting:   118 / 70  (left arm)  Vitals Entered By: Vernie Murders (September 10, 2009 9:24 AM)  O2 Flow:  Room air  Physical Exam  Additional Exam:  wt 236 > 232 September 10, 2009 amb anxious wm non toxic  GENERAL:  Alert & oriented; pleasant & cooperative. HEENT:  [place]/AT,   [place]-benign, EACs-clear, TMs-wnl, NOSE-pale w/ clear discharge, THROAT-clear & wnl. NECK:  Supple w/ full ROM; no JVD; normal carotid impulses w/o bruits; no  thyromegaly or nodules palpated; no lymphadenopathy. CHEST:  Clear to P & A; without wheezes/ rales/ or rhonchi. HEART:  Regular Rhythm; without murmurs/ rubs/ or gallops. ABDOMEN:  Soft & nontender; normal bowel sounds; no organomegaly or masses detected. neg cvat  EXT: deformity L great toe, no arthritic changes; no varicose veins/ venous insuffic/ or edema.   White Cell Count          10.1 K/uL                   4.5-10.5   Red Cell Count            5.07 Mil/uL                 4.22-5.81   Hemoglobin                14.8 g/dL                   16.1-09.6   Hematocrit                42.2 %                      39.0-52.0   MCV                       83.3 fl                     78.0-100.0   MCHC                      35.0 g/dL                   04.5-40.9   RDW                  [H]  15.7 %                      11.5-14.6   Platelet Count            204.0 K/uL                  150.0-400.0   Neutrophil %         [H]  84.9 %                      43.0-77.0   Lymphocyte %         [L]  7.0 %                       12.0-46.0   Monocyte %  6.6 %                       3.0-12.0   Eosinophils%              0.2 %                       0.0-5.0   Basophils %               1.3 %                       0.0-3.0   Neutrophill Absolute [H]  8.6 K/uL                    1.4-7.7   Lymphocyte Absolute       0.7 K/uL                    0.7-4.0   Monocyte Absolute         0.7 K/uL                    0.1-1.0  Eosinophils, Absolute                             0.0 K/uL                    0.0-0.7   Basophils Absolute        0.1 K/uL                    0.0-0.1  Tests: (2) BMP (METABOL)   Sodium                    140 mEq/L                   135-145   Potassium                 4.2 mEq/L                   3.5-5.1   Chloride                  102 mEq/L                   96-112   Carbon Dioxide            30 mEq/L                    19-32   Glucose              [H]  111 mg/dL                   11-91   BUN                        8 mg/dL                     4-78   Creatinine                1.1 mg/dL                   2.9-5.6   Calcium  8.9 mg/dL                   5.4-09.8   GFR                       73.03 mL/min                >60  Tests: (3) Hepatic/Liver Function Panel (HEPATIC)   Total Bilirubin      [H]  1.4 mg/dL                   1.1-9.1   Direct Bilirubin     [H]  0.4 mg/dL                   4.7-8.2   Alkaline Phosphatase      105 U/L                     39-117   AST                  [H]  89 U/L                      0-37   ALT                  [H]  220 U/L                     0-53   Total Protein             6.3 g/dL                    9.5-6.2   Albumin                   3.8 g/dL                    1.3-0.8  Tests: (4) UDip w/Micro (URINE)   Color                     DK. ORANGE       RANGE:  Yellow;Lt. Yellow   Clarity                   CLEAR                       Clear   Specific Gravity          >=1.030                     1.000 - 1.030   Urine Ph                  5.5                         5.0-8.0   Protein                   30                          Negative   Urine Glucose             NEGATIVE                    Negative   Ketones  NEGATIVE                    Negative   Urine Bilirubin           SMALL                       Negative   Blood                     NEGATIVE                    Negative   Urobilinogen              4.0                         0.0 - 1.0   Leukocyte Esterace        NEGATIVE                    Negative   Nitrite                   POSITIVE                    Negative   Urine WBC                 0-2/hpf                     0-2/hpf   Urine RBC                 0-2/hpf                     0-2/hpf   Urine Mucus               Presence of                 None   Urine Epith               Rare(0-4/hpf)               Rare(0-4/hpf)   Urine Bacteria            Rare(<10/hpf)               None   Granular Casts            Presence of                  None   CXR  Procedure date:  09/10/2009  Findings:        Comparison: 10/24/2008   Findings: Normal cardiomediastinal silhouette.  Lungs clear. Minimal peribronchial thickening and the peribronchial and lower lung zone regions.  Bony thorax intact.  Minimal dextroscoliosis. Degenerative changes AC joints.   IMPRESSION: No acute chest findings.  Chronic bronchitic markings.    Impression & Recommendations:  Problem # 1:  ABDOMINAL PAIN, UNSPECIFIED (ICD-789.00) Increase lft's  with PT > OT and abn u/a but certainly not dx of uti or infection with nl wbc  - try cipro and close f/u with GI referral vs admit if conditon worsens at all.      in meantime rx pain with tramadol as needed and discouraged from using tylenol  Medications Added to Medication List This Visit: 1)  Prilosec Otc 20 Mg Tbec (Omeprazole magnesium) .... Take  one 30-60 min before first meal  of the day 2)  Tylenol Extra Strength 500 Mg Tabs (Acetaminophen) .... Per bottle instructions as needed 3)  Tramadol Hcl 50 Mg Tabs (Tramadol hcl) .... One to two by mouth every 4-6 hours as needed for pain 4)  Cipro 500 Mg Tabs (Ciprofloxacin hcl) .... Take one tablet po twice a day  Other Orders: T-2 View CXR (71020TC) Est. Patient Level IV (16109) TLB-CBC Platelet - w/Differential (85025-CBCD) TLB-BMP (Basic Metabolic Panel-BMET) (80048-METABOL) TLB-Hepatic/Liver Function Pnl (80076-HEPATIC) TLB-Udip ONLY (81003-UDIP)  Patient Instructions: 1)  Cipro 500 mg twice daily with water x days 2)  for pain tramadol 50 mg 1-2 every 4 hours 3)  lots of soup, avoid dairy products fresh vegetables and salads 4)  if condition worsens go to ER Prescriptions: CIPRO 500 MG  TABS (CIPROFLOXACIN HCL) Take one tablet po twice a day  #14 x 0   Entered and Authorized by:   Nyoka Cowden MD   Signed by:   Nyoka Cowden MD on 09/10/2009   Method used:   Electronically to        [facility] [address] [facility] (retail)       [address]       [place], [place]  [address]       Ph: [phone]       Fax: [phone]   RxID:   [id number] TRAMADOL HCL 50 MG  TABS (TRAMADOL HCL) One to two by mouth every 4-6 hours as needed for pain  #40 x 0   Entered and Authorized by:   Nyoka Cowden MD   Signed by:   Nyoka Cowden MD on 09/10/2009   Method used:   Electronically to        [facility] [address] [facility] (retail)       [address]       [place], [place]  [address]       Ph: [phone]       Fax: [phone]   RxID:   [id number]

## 2010-06-25 NOTE — Progress Notes (Signed)
Summary: sick  Phone Note Call from Patient Call back at [facility] Phone [phone]   Caller: Patient Call For: wert Reason for Call: Talk to Nurse Summary of Call: just fever of 102. he was told yesterday that he could call this morning and they will work him in.  Initial call taken by: Valinda Hoar,  September 10, 2009 8:08 AM  Follow-up for Phone Call        called and spoke with pt.  pt state he woke up this morning with a fever of 102 and requests to be seen.  Pt denied NVD or cough or congestion.  pt scheduled to see MW today at 9:40am.  Arman Filter LPN  September 10, 2009 8:35 AM

## 2010-06-25 NOTE — Assessment & Plan Note (Signed)
Summary: physical ///kp   Primary Care Provider:  Dr. Alroy Dust  CC:  Yearly CPX....  History of Present Illness: 60 y/o WM, husb of [employer], here for CPX...    ~  Jun10:  last seen for a new pt CPX in Jan09- EMR note reviewed... doing well overall & only recent prob was suprapubic discomfort ? UTI Rx'd w/ Cipro called in and resolved, but he didn't have dysuria, fever, hematuria, etc (we will check UA)... we discussed risk factors of +FamHx, Smoking, Hyperchol and the need to quit smoking and consider Statin Rx...   ~  November 13, 2009:  he has had a good year & is on no perscription meds... unfortunately he continues to smoke  ~1/2ppd and is not motivated to quit... we discussed options & offered Chantix Rx which he says he will try... CXR, EKG, & labs all look good...    Current Problems:   CIGARETTE SMOKER (ICD-305.1) - started as teen, up to 1/2 ppd, quit for up to 83yrs, currently 1/2 ppd... no hx resp tract diseases...   ~  baseline CXRs showed clear, NAD.[name].  ~  CXR 6/11 remains clear, WNL, NAD...  HYPERCHOLESTEROLEMIA, BORDERLINE (ICD-272.4) - currently on diet Rx alone.  ~  FLP 1/09 showed TChol 198, TG 127, HDL 34, LDL 138  ~  FLP 6/10 showed TChol 170, TG 96, HDL 40, LDL 111  ~  FLP 6/11 showed TChol 184, TG 104, HDL 43, LDL 120  DYSPEPSIA (ICD-536.8) - takes PRILOSEC OTC daily... no dysphagia, N/V, abd pain, etc... no prev endo etc... states he really needs it daily & has pain/ difficulty if he stops the Rx.  DIVERTICULOSIS OF COLON (ICD-562.10) - had colonoscopy 1/06 from DrMann= divertics, tiny polyp (path=norm mucosa), hems... f/u planned 10 yrs.  Hx of MONONUCLEOSIS (ICD-075) - in high school... had mono hepatitis... no known sequellae... he also had RMSF at age 89- pretty sick, recoved w/o sequellae.  ~  labs 1/09 showed normal LFT's...  ~  labs 6/10 showed =  Hx of DYSPLASTIC NEVUS (ICD-216.9) - removed from base of neck/ upper back by DrHensel/ DrCrowe in  1993...  HEALTH MAINTENANCE:  on ASA 81mg  daily...  ~  GI:  colonoscopy 1/06 by DrMann  ~  GU: neg DRE & PSA here 1/09...  ~  Immunizations:  age 6 now> smoker- given PNEUMOVAX 6/10;  given TETANUS shot 6/10; discussed yearly FLU vaccines.   Preventive Screening-Counseling & Management  Alcohol-Tobacco     Smoking Status: current     Packs/Day: 1/4 ppd  Allergies: 1)  ! Penicillin  Comments:  Nurse/Medical Assistant: The patient's medications and allergies were reviewed with the patient and were updated in the Medication and Allergy Lists.  Past History:  Past Medical History: ALLERGIC RHINITIS (ICD-477.9) DERMATITIS (ICD-692.9) PHYSICAL EXAMINATION (ICD-V70.0) CIGARETTE SMOKER (ICD-305.1) HYPERCHOLESTEROLEMIA, BORDERLINE (ICD-272.4) DYSPEPSIA (ICD-536.8) DIVERTICULOSIS OF COLON (ICD-562.10) Hx of MONONUCLEOSIS (ICD-075) Hx of DYSPLASTIC NEVUS (ICD-216.9)  Past Surgical History: S/P T & A as a child L Foot injury w/ part amput great toe at age 23 Dysplastic Nevus excised from upper back in 03/93  Family History: Reviewed history from 10/24/2008 and no changes required. Father alive age 64 w/ prostate ca, has pacer. Mother died age 7 w/ heart disease, vasc disease No siblings...  Social History: Reviewed history from 10/24/2008 and no changes required. Married, wife Larita Fife, [place] yrs 2 Children: one son w/ HBP, one son w/ IDDM +Smoker-1/2 ppd  Social Etoh Employ: [employer].Marland KitchenMarland Kitchen  may retire soonPacks/Day:  1/4 ppd  Review of Systems  The patient denies fever, chills, sweats, anorexia, fatigue, weakness, malaise, weight loss, sleep disorder, blurring, diplopia, eye irritation, eye discharge, vision loss, eye pain, photophobia, earache, ear discharge, tinnitus, decreased hearing, nasal congestion, nosebleeds, sore throat, hoarseness, chest pain, palpitations, syncope, dyspnea on exertion, orthopnea, PND, peripheral edema, cough, dyspnea at rest, excessive sputum, hemoptysis,  wheezing, pleurisy, nausea, vomiting, diarrhea, constipation, change in bowel habits, abdominal pain, melena, hematochezia, jaundice, gas/bloating, indigestion/heartburn, dysphagia, odynophagia, dysuria, hematuria, urinary frequency, urinary hesitancy, nocturia, incontinence, back pain, joint pain, joint swelling, muscle cramps, muscle weakness, stiffness, arthritis, sciatica, restless legs, leg pain at night, leg pain with exertion, rash, itching, dryness, suspicious lesions, paralysis, paresthesias, seizures, tremors, vertigo, transient blindness, frequent falls, frequent headaches, difficulty walking, depression, anxiety, memory loss, confusion, cold intolerance, heat intolerance, polydipsia, polyphagia, polyuria, unusual weight change, abnormal bruising, bleeding, enlarged lymph nodes, urticaria, allergic rash, hay fever, and recurrent infections.    Vital Signs:  Patient profile:   60 year old male Height:      71 inches Weight:      232 pounds BMI:     32.47 O2 Sat:      94 % on Room air Temp:     97.1 degrees F oral Pulse rate:   62 / minute BP sitting:   110 / 78  (left arm) Cuff size:   large  Vitals Entered By: Randell Loop CMA (November 13, 2009 10:28 AM)  O2 Sat at Rest %:  94 O2 Flow:  Room air CC: Yearly CPX... Is Patient Diabetic? No Pain Assessment Patient in pain? no      Comments meds updated today with pt   Physical Exam  Additional Exam:  WD, Overweight, 60 y/o WM in NAD... GENERAL:  Alert & oriented; pleasant & cooperative... HEENT:  [place]/AT, EOM-wnl, PERRLA, Fundi-benign, EACs-clear, TMs-wnl, NOSE-clear, THROAT-clear & wnl. NECK:  Supple w/ full ROM; no JVD; normal carotid impulses w/o bruits; no thyromegaly or nodules palpated; no lymphadenopathy. CHEST:  Clear to P & A; without wheezes/ rales/ or rhonchi. HEART:  Regular Rhythm; without murmurs/ rubs/ or gallops. ABDOMEN:  Soft & nontender; normal bowel sounds; no organomegaly or masses detected. RECTAL:  Neg -  prostate 2+ & nontender w/o nodules; stool hematest neg. EXT: deformity L great toe, no arthritic changes; no varicose veins/ venous insuffic/ or edema. NEURO:  CN's intact; motor testing normal; sensory testing normal; gait normal & balance OK. DERM:  scar in upper back at base of neck toward R side, no new lesions noted...    CXR  Procedure date:  11/13/2009  Findings:      CHEST - 2 VIEW Comparison: Chest x-ray of 09/10/2009   Findings: The lungs are clear.  Mediastinal contours are stable. The heart is within upper limits of normal.  No acute bony abnormality is seen.   IMPRESSION: Stable chest x-ray.  No active lung disease.   Read By:  Juline Patch,  M.D.   EKG  Procedure date:  11/13/2009  Findings:      Normal sinus rhythm with rate of:  60/min... Tracing is WNL, NAD...  SN   MISC. Report  Procedure date:  11/13/2009  Findings:      BMP (METABOL)   Sodium                    141 mEq/L  135-145   Potassium                 4.6 mEq/L                   3.5-5.1   Chloride                  105 mEq/L                   96-112   Carbon Dioxide            28 mEq/L                    19-32   Glucose                   89 mg/dL                    25-42   BUN                       14 mg/dL                    7-06   Creatinine                1.0 mg/dL                   2.3-7.6   Calcium                   9.4 mg/dL                   2.8-31.5   GFR                       85.40 mL/min                >60  Hepatic/Liver Function Panel (HEPATIC)   Total Bilirubin           0.9 mg/dL                   1.7-6.1   Direct Bilirubin          0.2 mg/dL                   6.0-7.3   Alkaline Phosphatase      56 U/L                      39-117   AST                       20 U/L                      0-37   ALT                       27 U/L                      0-53   Total Protein             6.7 g/dL                    7.1-0.6   Albumin                   4.2 g/dL  3.5-5.2  CBC Platelet w/Diff (CBCD)   White Cell Count          7.1 K/uL                    4.5-10.5   Red Cell Count            5.01 Mil/uL                 4.22-5.81   Hemoglobin                14.8 g/dL                   28.4-13.2   Hematocrit                42.2 %                      39.0-52.0   MCV                       84.4 fl                     78.0-100.0   Platelet Count            210.0 K/uL                  150.0-400.0   Neutrophil %              66.6 %                      43.0-77.0   Lymphocyte %              23.4 %                      12.0-46.0   Monocyte %                7.2 %                       3.0-12.0   Eosinophils%              2.0 %                       0.0-5.0   Basophils %               0.8 %                       0.0-3.0  Comments:      Lipid Panel (LIPID)   Cholesterol               184 mg/dL                   4-401   Triglycerides             104.0 mg/dL                 0.2-725.3   HDL                       66.44 mg/dL                 >03.47   LDL Cholesterol      [H]  425 mg/dL  0-99   TSH (TSH)   FastTSH                   1.78 uIU/mL                 0.35-5.50  Prostate Specific Antigen (PSA)   PSA-Hyb                   0.72 ng/mL                  0.10-4.00   Impression & Recommendations:  Problem # 1:  CIGARETTE SMOKER (ICD-305.1) This is of primary import> must quit smoking... discussed Chanti Rx. His updated medication list for this problem includes:    Chantix Starting Month Pak 0.5 Mg X 11 & 1 Mg X 42 Tabs (Varenicline tartrate) ..... Use as directed...    Chantix Continuing Month Pak 1 Mg Tabs (Varenicline tartrate) ..... Use as directed...  Problem # 2:  HYPERCHOLESTEROLEMIA, BORDERLINE (ICD-272.4) He has sl incr LDL chol & we decided to continue diet + exercise, work on weight reduction... he doesn't want meds.  Problem # 3:  DYSPEPSIA (ICD-536.8) Continue Prilosec OTC daily... we discussed refer to GI id  any symptoms develope...  Problem # 4:  OTHER MEDICAL PROBLEMS AS NOTED>>>  Complete Medication List: 1)  Aspirin 81 Mg Tbec (Aspirin) .... Take 1 tab by mouth once daily.Marland KitchenMarland Kitchen 2)  Prilosec Otc 20 Mg Tbec (Omeprazole magnesium) .... Take  one 30-60 min before first meal of the day 3)  Multivitamins Tabs (Multiple vitamin) .... Take 1 tablet by mouth once a day 4)  Vitamin C 500 Mg Tabs (Ascorbic acid) .... Take 2 tablets by mouth once daily 5)  Chantix Starting Month Pak 0.5 Mg X 11 & 1 Mg X 42 Tabs (Varenicline tartrate) .... Use as directed... 6)  Chantix Continuing Month Pak 1 Mg Tabs (Varenicline tartrate) .... Use as directed...  Other Orders: EKG w/ Interpretation (93000) T-2 View CXR (71020TC) TLB-BMP (Basic Metabolic Panel-BMET) (80048-METABOL) TLB-Hepatic/Liver Function Pnl (80076-HEPATIC) TLB-CBC Platelet - w/Differential (85025-CBCD) TLB-Lipid Panel (80061-LIPID) TLB-TSH (Thyroid Stimulating Hormone) (84443-TSH) TLB-PSA (Prostate Specific Antigen) (84153-PSA)  Patient Instructions: 1)  Today we updated your med list- see below.... 2)  We wrote a new perscription for the CHANTIX to aide in smoking cessation.Marland KitchenMarland Kitchen 3)  Today we did your follow up CXR, EKG, & FASTING blood work... please call the "phone tree" in a few days for your lab results.Marland KitchenMarland Kitchen 4)  Call for any questions.Marland KitchenMarland Kitchen 5)  Let's work on weight reduction.Marland KitchenMarland Kitchen 6)  Please schedule a follow-up appointment in 1 year. Prescriptions: CHANTIX CONTINUING MONTH PAK 1 MG TABS (VARENICLINE TARTRATE) use as directed...  #1 pack x 5   Entered and Authorized by:   Michele Mcalpine MD   Signed by:   Michele Mcalpine MD on 11/13/2009   Method used:   Print then Give to Patient   RxID:   [id number] CHANTIX STARTING MONTH PAK 0.5 MG X 11 & 1 MG X 42 TABS (VARENICLINE TARTRATE) use as directed...  #1 pack x 1   Entered and Authorized by:   Michele Mcalpine MD   Signed by:   Michele Mcalpine MD on 11/13/2009   Method used:   Print then Give to  Patient   RxID:   [id number]      Card[facility]Perfect ECG  ID: [id number] Patient: William West DOB: 03-Oct-1950 Age: 60 Years Old Sex: Male Race: White Physician: Dorothy Polhemus  Technician: Randell Loop CMA Height: 71 Weight: 232 Status: Unconfirmed Past Medical History:  CIGARETTE SMOKER (ICD-305.1) - started as teen, up to 1/2 ppd, quit for up to 17yrs, currently 1/4 to 1/2 ppd... no hx resp tract diseases...   ~  CXR 1/09 showed clear, NAD.[name].  ~  CXR 6/10 showed = neg  HYPERCHOLESTEROLEMIA, BORDERLINE (ICD-272.4) - currently on diet Rx alone.  ~  FLP 1/09 showed TChol 198, TG 127, HDL 34, LDL 138   DYSPEPSIA (ICD-536.8) - takes PRILOSEC OTC daily... no dysphagia, N/V, abd pain, etc... no prev endo etc... states he really needs it daily & has pain/ difficulty if he stops the Rx.  DIVERTICULOSIS OF COLON (ICD-562.10) - had colonoscopy 1/06 from DrMann= divertics, tiny polyp (path=norm mucosa), hems... f/u planned 10 yrs.  Hx of MONONUCLEOSIS (ICD-075) - in high school... had mono hepatitis... no known sequellae... he also had RMSF at age 38- pretty sick, recoved w/o sequellae.  ~  labs 1/09 showed normal LFT's...  ~  labs 6/10 showed =  Hx of DYSPLASTIC NEVUS (ICD-216.9) - removed from base of neck/ upper back by DrHensel/ DrCrowe in 1993...  HEALTH MAINTENANCE:  on ASA 81mg  daily...  ~  GI:  colonoscopy 1/06 by Dr Loreta Ave  ~  GU: neg DRE & PSA here 1/09...  ~  Immunizations:  age 74 now> smoker- rec PNEUMOVAX; needs 27yr TETANUS shot; discussed yearly FLU vaccines. Recorded: 11/13/2009 10:47 AM P/PR: 102 ms / 158 ms - Heart rate (maximum exercise) QRS: 92 QT/QTc/QTd: 390 ms / 388 ms / 42 ms - Heart rate (maximum exercise)  P/QRS/T axis: 23 deg / 54 deg / 22 deg - Heart rate (maximum exercise)  Heartrate: 59 bpm  Interpretation:  Normal sinus rhythm with rate of:  60/min... Tracing is WNL, NAD...  SN

## 2010-06-25 NOTE — Consult Note (Signed)
Summary: Technical sales engineer  [facility]   Imported By: Lennie Odor 04/09/2010 15:14:47  _____________________________________________________________________  External Attachment:    Type:   Image     Comment:   External Document

## 2010-06-25 NOTE — Miscellaneous (Signed)
Summary: biopsy report  Clinical Lists Changes  Observations: Added new observation of COLONOSCOPY: Pathology:  Hyperplastic polyp. Rectum     (03/25/2010 17:32)      Colonoscopy  Procedure date:  03/25/2010  Findings:      Pathology:  Hyperplastic polyp. Rectum       Colonoscopy  Procedure date:  03/25/2010  Findings:      Pathology:  Hyperplastic polyp. Rectum

## 2010-06-25 NOTE — Letter (Signed)
Summary: [facility]   Imported By: Lennie Odor 12/23/2009 12:15:05  _____________________________________________________________________  External Attachment:    Type:   Image     Comment:   External Document

## 2010-08-08 LAB — CBC
HCT: 32.1 % — ABNORMAL LOW (ref 39.0–52.0)
HCT: 33.6 % — ABNORMAL LOW (ref 39.0–52.0)
HCT: 41 % (ref 39.0–52.0)
Hemoglobin: 11.3 g/dL — ABNORMAL LOW (ref 13.0–17.0)
Hemoglobin: 14.3 g/dL (ref 13.0–17.0)
MCH: 29.1 pg (ref 26.0–34.0)
MCV: 84.4 fL (ref 78.0–100.0)
Platelets: 190 10*3/uL (ref 150–400)
RBC: 3.98 MIL/uL — ABNORMAL LOW (ref 4.22–5.81)
RBC: 4.83 MIL/uL (ref 4.22–5.81)
RDW: 15.6 % — ABNORMAL HIGH (ref 11.5–15.5)
WBC: 15.4 10*3/uL — ABNORMAL HIGH (ref 4.0–10.5)

## 2010-08-08 LAB — COMPREHENSIVE METABOLIC PANEL
ALT: 25 U/L (ref 0–53)
AST: 31 U/L (ref 0–37)
Alkaline Phosphatase: 59 U/L (ref 39–117)
CO2: 22 mEq/L (ref 19–32)
Chloride: 106 mEq/L (ref 96–112)
GFR calc Af Amer: >60 mL/min (ref >60)
GFR calc non Af Amer: >60 mL/min (ref >60)
Glucose, Bld: 150 mg/dL — ABNORMAL HIGH (ref 70–99)
Sodium: 139 mEq/L (ref 135–145)
Total Bilirubin: 0.8 mg/dL (ref 0.3–1.2)

## 2010-08-08 LAB — URINALYSIS, ROUTINE W REFLEX MICROSCOPIC
Nitrite: NEGATIVE
Specific Gravity, Urine: 1.031 — ABNORMAL HIGH (ref 1.005–1.030)
pH: 6.5 (ref 5.0–8.0)

## 2010-08-08 LAB — BASIC METABOLIC PANEL
GFR calc non Af Amer: >60 mL/min (ref >60)
Potassium: 3.8 mEq/L (ref 3.5–5.1)
Sodium: 138 mEq/L (ref 135–145)

## 2010-08-08 LAB — HEPATIC FUNCTION PANEL
ALT: 26 U/L (ref 0–53)
AST: 24 U/L (ref 0–37)
Albumin: 2.8 g/dL — ABNORMAL LOW (ref 3.5–5.2)
Alkaline Phosphatase: 52 U/L (ref 39–117)
Bilirubin, Direct: 0.4 mg/dL — ABNORMAL HIGH (ref 0.0–0.3)
Indirect Bilirubin: 0.5 mg/dL (ref 0.3–0.9)
Total Bilirubin: 0.9 mg/dL (ref 0.3–1.2)
Total Bilirubin: 1.3 mg/dL — ABNORMAL HIGH (ref 0.3–1.2)
Total Protein: 5.7 g/dL — ABNORMAL LOW (ref 6.0–8.3)

## 2010-08-08 LAB — LIPASE, BLOOD: Lipase: 32 U/L (ref 11–59)

## 2010-08-08 LAB — DIFFERENTIAL
Lymphocytes Relative: 11 % — ABNORMAL LOW (ref 12–46)
Lymphs Abs: 1.1 10*3/uL (ref 0.7–4.0)
Neutrophils Relative %: 84 % — ABNORMAL HIGH (ref 43–77)

## 2010-10-09 NOTE — Op Note (Signed)
William West, William West              ACCOUNT NO.:  [id number]   MEDICAL RECORD NO.:  [id number]          PATIENT TYPE:  AMB   LOCATION:  ENDO                         FACILITY:  [facility]   PHYSICIAN:  Anselmo Rod, M.D.  DATE OF BIRTH:  July 04, 1950   DATE OF PROCEDURE:  06/08/2004  DATE OF DISCHARGE:                                 OPERATIVE REPORT   PROCEDURE PERFORMED:  Colonoscopy with cold biopsies times one.   ENDOSCOPIST:  Charna Elizabeth, M.D.   INSTRUMENT USED:  Olympus video colonoscope.   INDICATIONS FOR PROCEDURE:  A 60 year old white male undergoing screening  colonoscopy.  The patient has a history of occasional bright red blood per  rectum.   PREPROCEDURE PREPARATION:  Informed consent was procured from the patient.  The patient was fasted for eight hours prior to the procedure and prepped  with a bottle of magnesium citrate and a gallon of GoLYTELY the night prior  to the procedure.   PREPROCEDURE PHYSICAL:  The patient had stable vital signs.  Neck supple.  Chest clear to auscultation.  S1 and S2 regular.  Abdomen soft with normal  bowel sounds.   DESCRIPTION OF PROCEDURE:  The patient was placed in left lateral decubitus  position and sedated with 80 mg of Demerol and 8 mg of Versed in slow  incremental doses.  Once the patient was adequately sedated and maintained  on low flow oxygen and continuous cardiac monitoring, the Olympus video  colonoscope was advanced from the rectum to the cecum.  The appendicular  orifice and ileocecal valve were clearly visualized and photographed.  Small  internal hemorrhoids were seen on retroflexion.  A small sessile polyp was  biopsied from the rectosigmoid colon. There were scattered sigmoid  diverticuli. The rest of the exam was unremarkable.  The patient tolerated  the procedure well without complication.   IMPRESSION:  1.  Small internal hemorrhoid.  2.  Small sessile polyp biopsied from rectosigmoid colon (cold biopsy times     one).  3.  Otherwise normal colonoscopy up to the cecum.  4.  Scattered sigmoid diverticuolsis.   RECOMMENDATIONS:  1.  Await pathology results.  2.  Avoid all nonsteroidals including aspirin for the next two weeks.  3.  Repeat colonoscopy depending on pathology results.  4.  Outpatient followup as need arises in the future.      Jyot   JNM/MEDQ  D:  06/08/2004  T:  06/08/2004  Job:  [id number]   cc:   William A. Leveda Anna, M.D.  Fax: [phone]

## 2010-11-23 ENCOUNTER — Ambulatory Visit (INDEPENDENT_AMBULATORY_CARE_PROVIDER_SITE_OTHER): Payer: BC Managed Care – PPO | Admitting: Adult Health

## 2010-11-23 ENCOUNTER — Encounter: Payer: Self-pay | Admitting: *Deleted

## 2010-11-23 ENCOUNTER — Other Ambulatory Visit: Payer: Self-pay | Admitting: *Deleted

## 2010-11-23 VITALS — BP 108/72 | HR 77 | Temp 97.9°F | Ht 71.5 in | Wt 235.6 lb

## 2010-11-23 DIAGNOSIS — J019 Acute sinusitis, unspecified: Secondary | ICD-10-CM

## 2010-11-23 DIAGNOSIS — J40 Bronchitis, not specified as acute or chronic: Secondary | ICD-10-CM

## 2010-11-23 MED ORDER — AZITHROMYCIN 250 MG PO TABS: ORAL_TABLET | ORAL | Status: AC

## 2010-11-23 MED ORDER — HYDROCODONE-HOMATROPINE 5-1.5 MG/5ML PO SYRP: 5.0000 mL | ORAL_SOLUTION | Freq: Four times a day (QID) | ORAL | Status: AC | PRN

## 2010-11-23 MED ORDER — HYDROCODONE-HOMATROPINE 5-1.5 MG/5ML PO SYRP: 5.0000 mL | ORAL_SOLUTION | Freq: Four times a day (QID) | ORAL | Status: DC | PRN

## 2010-11-23 MED ORDER — ALBUTEROL SULFATE (2.5 MG/3ML) 0.083% IN NEBU
2.5000 mg | INHALATION_SOLUTION | Freq: Once | RESPIRATORY_TRACT | Status: AC
  Administered 2010-11-23: 2.5 mg via RESPIRATORY_TRACT

## 2010-11-23 NOTE — Progress Notes (Signed)
Addended by: Gweneth Dimitri D on: 11/23/2010 04:44 PM   Modules accepted: Orders

## 2010-11-23 NOTE — Progress Notes (Signed)
  Subjective:    Patient ID: William West, male    DOB: 05-21-1951, 60 y.o.   MRN: [id number]  HPI 60 yo male with known hx of AR , Rheumatoid Arthritis  11/23/2010 Acute OV  Pt presents for an acute office visit. Complains wheezing, sinus pressure/congestion and prod cough with yellow mucus, HA, low grade temp x 6 days. OTC not working. Recent trip to [place] 2 weeks ago. Had friends stay over last week that had URI symptoms. Congestion and drainage work at night.    Review of Systems Constitutional:   No  weight loss, night sweats,  Fevers, chills, fatigue, or  lassitude.  HEENT:   No headaches,  Difficulty swallowing,  Tooth/dental problems, or  Sore throat,               + sneezing, itching,   nasal congestion, post nasal drip,   CV:  No chest pain,  Orthopnea, PND, swelling in lower extremities, anasarca, dizziness, palpitations, syncope.   GI  No heartburn, indigestion, abdominal pain, nausea, vomiting, diarrhea, change in bowel habits, loss of appetite, bloody stools.   Resp:  No coughing up of blood.     No chest wall deformity  Skin: no rash or lesions.  GU: no dysuria, change in color of urine, no urgency or frequency.  No flank pain, no hematuria   MS:  No joint pain or swelling.  No decreased range of motion.  No back pain.  Psych:  No change in mood or affect. No depression or anxiety.  No memory loss.         Objective:   Physical Exam GEN: A/Ox3; pleasant , NAD, well nourished   HEENT:  [place]/AT,  EACs-clear, TMs-wnl, NOSE-clear drainage, max tenderness,  THROAT-clear, no lesions, no postnasal drip or exudate noted.   NECK:  Supple w/ fair ROM; no JVD; normal carotid impulses w/o bruits; no thyromegaly or nodules palpated; no lymphadenopathy.  RESP  Clear  P & A; w/o, wheezes/ rales/ or rhonchi.no accessory muscle use, no dullness to percussion  CARD:  RRR, no m/r/g  , no peripheral edema, pulses intact, no cyanosis or clubbing.  GI:   Soft & nt; nml bowel  sounds; no organomegaly or masses detected.  Musco: Warm bil, no deformities or joint swelling noted.   Neuro: alert, no focal deficits noted.    Skin: Warm, no lesions or rashes          Assessment & Plan:

## 2010-11-23 NOTE — Assessment & Plan Note (Addendum)
URI/sinusitis/bronchitis Albuterol neb given in office  Plan:   Zpack take as directed.  Mucinex DM Twice daily  As needed  Cough/congestion  Fluids and rest Saline nasal rinses As needed   Hydromet 1-2 tsp every 4-6 hr As needed  Cough/congestion  Please contact office for sooner follow up if symptoms do not improve or worsen or seek emergency care  Follow up for [facility] for physical -first available.

## 2010-11-23 NOTE — Patient Instructions (Signed)
Zpack take as directed.  Mucinex DM Twice daily  As needed  Cough/congestion  Fluids and rest Saline nasal rinses As needed   Hydromet 1-2 tsp every 4-6 hr As needed  Cough/congestion  Please contact office for sooner follow up if symptoms do not improve or worsen or seek emergency care  Follow up for [facility] for physical -first available.

## 2011-01-06 ENCOUNTER — Encounter: Payer: Self-pay | Admitting: Pulmonary Disease

## 2011-01-06 ENCOUNTER — Ambulatory Visit (INDEPENDENT_AMBULATORY_CARE_PROVIDER_SITE_OTHER): Payer: BC Managed Care – PPO | Admitting: Pulmonary Disease

## 2011-01-06 ENCOUNTER — Other Ambulatory Visit (INDEPENDENT_AMBULATORY_CARE_PROVIDER_SITE_OTHER): Payer: BC Managed Care – PPO

## 2011-01-06 VITALS — BP 124/88 | HR 65 | Temp 97.3°F | Ht 71.0 in | Wt 236.6 lb

## 2011-01-06 DIAGNOSIS — Z0000 Encounter for general adult medical examination without abnormal findings: Secondary | ICD-10-CM

## 2011-01-06 DIAGNOSIS — K5730 Diverticulosis of large intestine without perforation or abscess without bleeding: Secondary | ICD-10-CM

## 2011-01-06 DIAGNOSIS — K3189 Other diseases of stomach and duodenum: Secondary | ICD-10-CM

## 2011-01-06 DIAGNOSIS — R109 Unspecified abdominal pain: Secondary | ICD-10-CM

## 2011-01-06 DIAGNOSIS — E785 Hyperlipidemia, unspecified: Secondary | ICD-10-CM

## 2011-01-06 DIAGNOSIS — M069 Rheumatoid arthritis, unspecified: Secondary | ICD-10-CM | POA: Insufficient documentation

## 2011-01-06 DIAGNOSIS — R1013 Epigastric pain: Secondary | ICD-10-CM

## 2011-01-06 LAB — BASIC METABOLIC PANEL
CO2: 28 mEq/L (ref 19–32)
Calcium: 9.3 mg/dL (ref 8.4–10.5)
Chloride: 107 mEq/L (ref 96–112)
Glucose, Bld: 93 mg/dL (ref 70–99)
Potassium: 4.2 mEq/L (ref 3.5–5.1)
Sodium: 142 mEq/L (ref 135–145)

## 2011-01-06 LAB — LIPID PANEL
HDL: 43 mg/dL (ref >39.00)
Total CHOL/HDL Ratio: 4

## 2011-01-06 LAB — CBC WITH DIFFERENTIAL/PLATELET
Basophils Relative: 0.6 % (ref 0.0–3.0)
Eosinophils Relative: 1.6 % (ref 0.0–5.0)
HCT: 41.5 % (ref 39.0–52.0)
Hemoglobin: 14.1 g/dL (ref 13.0–17.0)
Lymphs Abs: 1.2 10*3/uL (ref 0.7–4.0)
MCV: 87.9 fl (ref 78.0–100.0)
Monocytes Absolute: 0.4 10*3/uL (ref 0.1–1.0)
Neutrophils Relative %: 67.9 % (ref 43.0–77.0)
RBC: 4.72 Mil/uL (ref 4.22–5.81)
WBC: 5.5 10*3/uL (ref 4.5–10.5)

## 2011-01-06 LAB — HEPATIC FUNCTION PANEL: Total Bilirubin: 1.1 mg/dL (ref 0.3–1.2)

## 2011-01-06 LAB — PSA: PSA: 0.7 ng/mL (ref 0.10–4.00)

## 2011-01-06 NOTE — Progress Notes (Signed)
Subjective:    Patient ID: William West, male    DOB: March 16, 1951, 60 y.o.   MRN: [id number]  HPI 60 y/o WM, husb of William West, here for a follow up visit & CPX...  ~  November 13, 2009:  he has had a good year & is on no perscription meds... unfortunately he continues to smoke ~1/2ppd and is not motivated to quit... we discussed options & offered Chantix Rx which he says he will try... CXR, EKG, & labs all look good...  ~  March 20, 2010:  Add-on after experiencing acute jaw pain 4d ago- hurt to chew etc... resolved spont & "moved to my right hand" w/ pain (the whole hand he says) and decr grip, then moved to left hand... notes swelling in MCPs & tried Tylenol & 4Ibuprofen- pain wax & waned... note> mother had RA, but he hasn't had much in the way of joint complaints prev... we discussed further eval w/ XRays of hands (neg), & blood work (Sed=8, CRP hi at 15, RA +low liter 24, Anti-CCP=51, ANA=neg  )... Rx w/ Dosepak & Mobic pending results & prob Rheum consultation...   He had acute cholecystitis 7/11 & presented to [facility]> s/p lap chole by [name] w/ gallstones & purulent fluid around GB... saw DrNatt-Mann 9/11 for GI f/u and colonoscopy is sched 11/11... he says he quit smoking 6/11...  ~  January 06, 2011:  14mo ROV & he is under the care of DrBeeker for Rheum w/ RA on MTX 6tabs/wk & occas needs Pred taper> doing well overall;  He had a bout of sinusitis 7/12 treated w/ ZPak, Mucinex, Hydromet & improved;  Now c/o vague intermittent right flank area discomfort x 2wks (no n/v, no ch in BMs, etc);  He reports f/u colonoscopy by [facility] 11/11 & it was OK by his report, told to f/u in 53yrs;  Meds reviewed, labs OK, see below >>   Current Problems:   CIGARETTE SMOKER (ICD-305.1) - started as teen, up to 1/2 ppd, quit for up to 20yrs, recently 1/2 ppd but he says he quit 6/11 & has remained quit!  No hx resp tract diseases...  ~  baseline CXRs showed clear, NAD.[name]. ~  CXR 7/11 showed  essent clear lungs, +gallstone seen in right abd...  HYPERCHOLESTEROLEMIA, BORDERLINE (ICD-272.4) - currently on diet Rx alone. ~  FLP 1/09 showed TChol 198, TG 127, HDL 34, LDL 138 ~  FLP 6/10 showed TChol 170, TG 96, HDL 40, LDL 111 ~  FLP 6/11 showed TChol 184, TG 104, HDL 43, LDL 120... rec low chol/ low fat diet. ~  FLP 8/12 (wt=237#) showed TChol 183, TG 97, HDL 43, LDL 121... Not really on diet "I just eat"  DYSPEPSIA (ICD-536.8) - takes PRILOSEC OTC daily... no dysphagia, N/V, abd pain, etc... no prev endo etc... states he really needs it daily & has pain/ difficulty if he stops the Rx. ~  S/p lap chole 7/11 by [name] for cholecystitis & stones...  DIVERTICULOSIS OF COLON (ICD-562.10)  ~  He had colonoscopy 1/06 from DrMann= divertics, tiny polyp (path=norm mucosa), hems... ~  F/u colon 11/11 by DrNat-Mann w/ divertics, hems, sm rectal polyps= fragments of benign mucosa only...  Hx of MONONUCLEOSIS (ICD-075) - in high school... had mono hepatitis... no known sequellae... he also had RMSF at age 14- pretty sick, recovered w/o sequellae. ~  labs have consistently shown normal LFTs x during his cholecystitis presentation 7/11...  RHEUMATOID ARTHRITIS >> Diagnosed w/ sero-pos RA  10/11 & referred to Rheum, [name]- on MTX currently 6/wk & occas Pred taper...  Hx of DYSPLASTIC NEVUS (ICD-216.9) - removed from base of neck/ upper back by DrHensel/ DrCrowe in 1993...  HEALTH MAINTENANCE:  on ASA 81mg  daily... ~  GI:  colonoscopy 1/06 by [facility] w/ f/u due later this yr. ~  GU: neg DRE & PSA here 1/09 & 6/11 ~  Immunizations:  age 76 now> smoker- given PNEUMOVAX 6/10;  given TETANUS shot 6/10; discussed yearly FLU vaccines.   Current Medications, Allergies, Past Medical History, Past Surgical History, Family History, and Social History were reviewed in [employer] record.   Past Surgical History  Procedure Date  . Tonsillectomy and adenoidectomy as a child   . L foot injury age 4    w/ part amputation great toe  . Dysplastic nevus 07/1991    excised from upper back    Outpatient Encounter Prescriptions as of 01/06/2011  Medication Sig Dispense Refill  . acetaminophen (TYLENOL ARTHRITIS PAIN) 650 MG CR tablet Take 650 mg by mouth every 8 (eight) hours as needed.        . Ascorbic Acid (VITAMIN C) 500 MG tablet Take 2 tablets by mouth once daily       . aspirin 81 MG tablet Take 81 mg by mouth daily.        . folic acid (FOLVITE) 800 MCG tablet Take 800 mcg by mouth daily.        [name] ibuprofen (ADVIL,MOTRIN) 200 MG tablet Take 800 mg by mouth. As needed for pain      . methotrexate (RHEUMATREX) 2.5 MG tablet 6 tablets by mouth every Monday       . multivitamin (THERAGRAN) per tablet Take 1 tablet by mouth daily.        [name] omega-3 fish oil (MAXEPA) 1000 MG CAPS capsule Take 1 capsule by mouth daily.        [name] omeprazole (PRILOSEC) 20 MG capsule Take one 30-60 minutes before first meal of the day       . Pseudoephedrine-Ibuprofen 30-200 MG TABS As needed       . DISCONTD: Glucosamine Sulfate 500 MG CAPS Take 2 capsules by mouth daily.          Allergies  Allergen Reactions  . Penicillins     REACTION: rash    Review of Systems        See HPI - all other systems neg except as noted...        The patient denies anorexia, fever, weight loss, weight gain, vision loss, decreased hearing, hoarseness, chest pain, syncope, dyspnea on exertion, peripheral edema, prolonged cough, headaches, hemoptysis, abdominal pain, melena, hematochezia, severe indigestion/heartburn, hematuria, incontinence, muscle weakness, suspicious skin lesions, transient blindness, difficulty walking, depression, unusual weight change, abnormal bleeding, enlarged lymph nodes, and angioedema.   MS:  Complains of joint pain, joint swelling, loss of strength, and stiffness (all improved on MTX); denies joint redness, low back pain, mid back pain, muscle aches, cramps, and thoracic  pain.   Objective:   Physical Exam     WD, Overweight, 60 y/o WM in NAD... GENERAL:  Alert & oriented; pleasant & cooperative... HEENT:  [place]/AT, EOM-wnl, PERRLA, Fundi-benign, EACs-clear, TMs-wnl, NOSE-clear, THROAT-clear & wnl. NECK:  Supple w/ full ROM; no JVD; normal carotid impulses w/o bruits; no thyromegaly or nodules palpated; no lymphadenopathy. CHEST:  Clear to P & A; without wheezes/ rales/ or rhonchi. HEART:  Regular Rhythm; without murmurs/ rubs/ or  gallops. ABDOMEN:  Soft & nontender; s/p lap chole, normal bowel sounds; no organomegaly or masses detected. (RECTAL:  Neg - prostate 2+ & nontender w/o nodules; stool hematest neg) EXT: deformity L great toe, sl swelling & tender MCPs, some IPs, & wrist; no varicose veins/ venous insuffic/ or edema. NEURO:  CN's intact; motor testing normal; sensory testing normal; gait normal & balance OK. DERM:  scar in upper back at base of neck toward R side, no new lesions noted...   Assessment & Plan:   CPX>  He quit smoking 6/11;  EKG w/ NSR, NSSTTWA, NAD;  Labs look good x persistent LDL ~120 on diet Rx...  CHOL>  As noted w/ LDL ~120 range needs better diet, wt reduction or consider low dose statin rx, he will decide...  Dyspepsia>  On Prilosec daily...  Divertics/ sm polyps>  follwed by DrNat-Mann & up to date on colon screening etc...  Sero-positive RA>  Followed by [name] & stable on MTX & occas rounds of Pred.,..  Other medical problems as noted.Marland KitchenMarland Kitchen

## 2011-01-06 NOTE — Patient Instructions (Signed)
Today we updated your med list in EPIC...  Today we did your follow up fasting blood work...    Please call the [facility]REE in a few days for your results...    Dial [id number] & when prompted enter your patient number followed by the # symbol...    Your patient number is:  [id number]#  For the discomfort/ pain in your right side>    Try to avoid the aggrevating activities...    Apply heat> icey hot, ben-gay, heating pad, etc...    Let us know if the situation persists or worsens so we can initiate further investigation...  Call for any questions.Marland KitchenMarland Kitchen

## 2011-01-08 ENCOUNTER — Encounter: Payer: Self-pay | Admitting: Pulmonary Disease

## 2011-04-19 ENCOUNTER — Ambulatory Visit (INDEPENDENT_AMBULATORY_CARE_PROVIDER_SITE_OTHER): Payer: BC Managed Care – PPO | Admitting: General Surgery

## 2011-04-19 ENCOUNTER — Encounter (INDEPENDENT_AMBULATORY_CARE_PROVIDER_SITE_OTHER): Payer: Self-pay | Admitting: General Surgery

## 2011-04-19 DIAGNOSIS — R1011 Right upper quadrant pain: Secondary | ICD-10-CM

## 2011-04-19 NOTE — Progress Notes (Signed)
Subjective:     Patient ID: William West, male   DOB: 10/04/1950, 60 y.o.   MRN: [id number]  HPI 89 yom with history of lap chole in 2011 who presents with several months of constant, dull ruq pain. This occurred after doing heavy lifting.  It is worse with activity and better with rest.  It never completely goes away.  He does not notice a bulge at the site.  He has also been diagnosed with RA and is on MTX since our last visit.  He is having no trouble eating or with bowel movements.  He denies nausea or vomiting.  Review of Systems  Constitutional: Negative for fever, chills and unexpected weight change.  HENT: Negative for hearing loss, congestion, sore throat, trouble swallowing and voice change.   Eyes: Negative for visual disturbance.  Respiratory: Negative for cough and wheezing.   Cardiovascular: Positive for palpitations. Negative for chest pain and leg swelling.  Gastrointestinal: Positive for abdominal pain. Negative for nausea, vomiting, diarrhea, constipation, blood in stool, abdominal distention, anal bleeding and rectal pain.  Genitourinary: Negative for hematuria and difficulty urinating.  Musculoskeletal: Positive for arthralgias.  Skin: Negative for rash and wound.  Neurological: Negative for seizures, syncope, weakness and headaches.  Hematological: Negative for adenopathy. Does not bruise/bleed easily.  Psychiatric/Behavioral: Negative for confusion.       Objective:   Physical Exam  Constitutional: He appears well-developed and well-nourished.  Abdominal: Soft. He exhibits no distension and no mass. There is tenderness (mildly tender ruq what feels like his lower costal margin, I do not palpate a definite hernia at his port sites. ). There is no rebound.         Assessment:    s/p lap chole with ruq pain    Plan:     He states lfts were checked recently and I will need to follow this up. This may be musculoskeletal in nature.  I don't think on exam he has  a hernia at his five mm port sites but it is possible. Due to his body habitus, we discussed a ct scan to rule out hernia or other source that we would need to address surgically.  This has not resolved with rest so reasonable to proceed with ct scan.  I will call with his results.

## 2011-04-20 ENCOUNTER — Telehealth (INDEPENDENT_AMBULATORY_CARE_PROVIDER_SITE_OTHER): Payer: Self-pay

## 2011-04-20 NOTE — Telephone Encounter (Signed)
Pt aware of CT appt on 04-21-11 @9 :15/ [facility]

## 2011-04-20 NOTE — Telephone Encounter (Signed)
Called pt with CT appt/ [facility]

## 2011-04-21 ENCOUNTER — Telehealth (INDEPENDENT_AMBULATORY_CARE_PROVIDER_SITE_OTHER): Payer: Self-pay

## 2011-04-21 ENCOUNTER — Ambulatory Visit
Admission: RE | Admit: 2011-04-21 | Discharge: 2011-04-21 | Disposition: A | Discharge status: Home / Self Care | Payer: BC Managed Care – PPO | Source: Ambulatory Visit | Attending: General Surgery | Admitting: General Surgery

## 2011-04-21 MED ORDER — IOHEXOL 300 MG/ML  SOLN
125.0000 mL | Freq: Once | INTRAMUSCULAR | Status: AC | PRN
  Administered 2011-04-21: 125 mL via INTRAVENOUS

## 2011-04-21 NOTE — Telephone Encounter (Signed)
Called pt to notify him that his CT was normal per Dr Dwain Sarna [facility]

## 2011-05-10 ENCOUNTER — Telehealth: Payer: Self-pay | Admitting: Pulmonary Disease

## 2011-05-10 MED ORDER — LEVOFLOXACIN 750 MG PO TABS: 750.0000 mg | ORAL_TABLET | Freq: Every day | ORAL | Status: DC

## 2011-05-10 MED ORDER — LEVOFLOXACIN 750 MG PO TABS: 750.0000 mg | ORAL_TABLET | Freq: Every day | ORAL | Status: AC

## 2011-05-10 NOTE — Telephone Encounter (Signed)
I spoke with pt and he c/o fever of 100.1, cough w/ green phlem, chest and nasal congestion, body aches, sore throat, and feels fatigue x Friday. Pt has been taking mucinex DM BID, saline nasal rinse, liquid tylenol, vitamin c, and tussionex cough syrup. Pt is also currently on a 12 day prednisone taper for his arthritis and is currently on the 5th day. Pt is due to take his methotrexate today and is wanting to know she he not take it since he is sick. Please advise recs for pt Dr. Kriste Basque, thanks  Allergies  Allergen Reactions  . Penicillins     REACTION: rash

## 2011-05-10 NOTE — Telephone Encounter (Signed)
I spoke with spouse and she states pt still has a fever of 102 and took first dose of Levaquin this mornings. I advised spouse it will take a day or 2 for the Levaquin to kick in. I advised her continue with SN recs and if pt is still running a fever tomorrow to call us back. She voiced her understanding

## 2011-05-10 NOTE — Telephone Encounter (Signed)
Per SN---hold the methotrexate until temp is gone---rest, fluids, tylenol, cont the mucinex and call in levaquin 750mg   #7  1 daily until gone.  Called and spoke with pt and he is aware of SN recs.

## 2012-01-12 ENCOUNTER — Encounter: Payer: Self-pay | Admitting: Pulmonary Disease

## 2012-01-12 ENCOUNTER — Ambulatory Visit (INDEPENDENT_AMBULATORY_CARE_PROVIDER_SITE_OTHER): Payer: BC Managed Care – PPO | Admitting: Pulmonary Disease

## 2012-01-12 ENCOUNTER — Ambulatory Visit (INDEPENDENT_AMBULATORY_CARE_PROVIDER_SITE_OTHER)
Admission: RE | Admit: 2012-01-12 | Discharge: 2012-01-12 | Disposition: A | Discharge status: Home / Self Care | Payer: BC Managed Care – PPO | Source: Ambulatory Visit | Attending: Pulmonary Disease | Admitting: Pulmonary Disease

## 2012-01-12 ENCOUNTER — Other Ambulatory Visit (INDEPENDENT_AMBULATORY_CARE_PROVIDER_SITE_OTHER): Payer: BC Managed Care – PPO

## 2012-01-12 VITALS — BP 116/80 | HR 64 | Temp 97.2°F | Ht 71.0 in | Wt 234.8 lb

## 2012-01-12 DIAGNOSIS — Z0000 Encounter for general adult medical examination without abnormal findings: Secondary | ICD-10-CM

## 2012-01-12 DIAGNOSIS — K3189 Other diseases of stomach and duodenum: Secondary | ICD-10-CM

## 2012-01-12 DIAGNOSIS — E785 Hyperlipidemia, unspecified: Secondary | ICD-10-CM

## 2012-01-12 DIAGNOSIS — K5730 Diverticulosis of large intestine without perforation or abscess without bleeding: Secondary | ICD-10-CM

## 2012-01-12 DIAGNOSIS — R1013 Epigastric pain: Secondary | ICD-10-CM

## 2012-01-12 LAB — BASIC METABOLIC PANEL
BUN: 14 mg/dL (ref 6–23)
Chloride: 104 mEq/L (ref 96–112)
Creatinine, Ser: 1 mg/dL (ref 0.4–1.5)
GFR: 81.81 mL/min (ref >60.00)
Glucose, Bld: 90 mg/dL (ref 70–99)
Potassium: 4.2 mEq/L (ref 3.5–5.1)

## 2012-01-12 LAB — CBC WITH DIFFERENTIAL/PLATELET
Basophils Relative: 0.6 % (ref 0.0–3.0)
Eosinophils Relative: 1.4 % (ref 0.0–5.0)
Monocytes Relative: 10.8 % (ref 3.0–12.0)
Neutrophils Relative %: 62 % (ref 43.0–77.0)
Platelets: 224 10*3/uL (ref 150.0–400.0)
RBC: 4.83 Mil/uL (ref 4.22–5.81)
WBC: 5 10*3/uL (ref 4.5–10.5)

## 2012-01-12 LAB — HEPATIC FUNCTION PANEL
ALT: 26 U/L (ref 0–53)
AST: 24 U/L (ref 0–37)
Albumin: 4.3 g/dL (ref 3.5–5.2)
Total Bilirubin: 1.5 mg/dL — ABNORMAL HIGH (ref 0.3–1.2)

## 2012-01-12 LAB — LIPID PANEL
Cholesterol: 176 mg/dL (ref 0–200)
LDL Cholesterol: 120 mg/dL — ABNORMAL HIGH (ref 0–99)
Triglycerides: 78 mg/dL (ref 0.0–149.0)
VLDL: 15.6 mg/dL (ref 0.0–40.0)

## 2012-01-12 LAB — TSH: TSH: 1.81 u[IU]/mL (ref 0.35–5.50)

## 2012-01-12 MED ORDER — LORAZEPAM 1 MG PO TABS: ORAL_TABLET | ORAL | Status: DC

## 2012-01-12 NOTE — Patient Instructions (Addendum)
Today we updated your med list in our EPIC system...    Continue your current medications the same...  We decided to add LORAZEPAM 1mg - take 1/2 to 1 tab as needed up to three times daily for palpitations, racing, or at bedtime for insomnia.  Today we did your f/u CXR, EKG, & FASTING blood work...    We will call you w/ the results when avail...  Stay as active as possible, & work on weight reduction...  Remember: No caffeine, No pseudophed-like meds, etc...  Call for any questions...  Let's continue our routine yearly follow up visits but call any time for [problems.Marland KitchenMarland Kitchen

## 2012-01-12 NOTE — Progress Notes (Signed)
Subjective:    Patient ID: William West, male    DOB: [birth year], 61 y.o.   MRN: [id number]  HPI 61 y/o WM, husb of William West, here for a follow up visit & CPX...  ~  November 13, 2009:  he has had a good year & is on no perscription meds... unfortunately he continues to smoke ~1/2ppd and is not motivated to quit... we discussed options & offered Chantix Rx which he says he will try... CXR, EKG, & labs all look good...  ~  March 20, 2010:  Add-on after experiencing acute jaw pain 4d ago- hurt to chew etc... resolved spont & "moved to my right hand" w/ pain (the whole hand he says) and decr grip, then moved to left hand... notes swelling in MCPs & tried Tylenol & 4Ibuprofen- pain wax & waned... note> mother had RA, but he hasn't had much in the way of joint complaints prev... we discussed further eval w/ XRays of hands (neg), & blood work (Sed=8, CRP hi at 15, RA +low liter 24, Anti-CCP=51, ANA=neg  )... Rx w/ Dosepak & Mobic pending results & prob Rheum consultation...   He had acute cholecystitis 7/11 & presented to [facility]> s/p lap chole by William West w/ gallstones & purulent fluid around GB... saw William West 9/11 for GI f/u and colonoscopy is sched 11/11... he says he quit smoking 6/11...  ~  January 06, 2011:  61mo ROV & he is under the care of William West for Rheum w/ RA on MTX 6tabs/wk & occas needs Pred taper> doing well overall;  He had a bout of sinusitis 7/12 treated w/ ZPak, Mucinex, Hydromet & improved;  Now c/o vague intermittent right flank area discomfort x 2wks (no n/v, no ch in BMs, etc);  He reports f/u colonoscopy by William West 11/11 & it was OK by his report, told to f/u in 37yrs;  Meds reviewed, labs OK, see below >>  ~  January 12, 2012:  Yearly ROV & CPX>  William West's CC is insomnia (hard to fall asleep & stay asleep) but he tried relatives Lorazepam w/ good result & wonders if this would be ok for him to take... He also c/o intermittent palpitations, racing, & this was worse after  recent bee sting; we discussed catecholamine excess 7 avoiding caffeine, nicotine, pseudophed, etc; he used some Benedryl & notes improvement w/ dietary adjustment he says...      Ex-smoker> he quit in 2011 & has remained off cigs; breathing is good he says, no issues; exerc w/ yard work, splits wood, etc...    Chol> on diet alone & wt=235# (unchanged); FLP is fair w/ TChol 176, TG 78, HDL 41, LDL 120; we reviewed diet & wt reduction strategies...    Dyspepsia> on Prilosec 20mg /d & this helps to control his symptoms; he saw William West 11/12 at [facility] for RUQ pain after lifting- CT was neg & the pain was felt to be musculoskeletal pain; s/p lap chole 7/11 for stones...    Divertics> followed by William West for GI; colon 2011 was neg & f/u should be 7-75yrs...    RA> followed by William West & recently started on ENBREL & still on the MTX, Advil, tylenol, etc...    Hx dysplastic nevus, fair skinned/ red hair> advised to get Derm eval at least yearly... We reviewed prob list, meds, xrays and labs> see below>> CXR 8/13 showed normal heart size, clear lungs, low lung vols & sl elev of right hemidiaph, s/p GB... EKG 8/13 showed NSR, rate62, wnl,  NAD.Marland KitchenMarland Kitchen LABS 8/13:  FLP- ok x LDL=120 on diet alone;  Chems- wnl;  CBC- wnl;  TSH=1.81;  PSA=0.62    Problem List:     CIGARETTE SMOKER (ICD-305.1) - started as teen, up to 1/2 ppd, quit for up to 43yrs, recently 1/2 ppd but he says he quit 6/11 & has remained quit!  No hx resp tract diseases...  ~  baseline CXRs showed clear, NAD.[name]. ~  CXR 7/11 showed essent clear lungs, +gallstone seen in right abd... ~  CXR 8/13 showed normal heart size, clear lungs, low lung vols & sl elev of right hemidiaph, s/p GB...  HYPERCHOLESTEROLEMIA, BORDERLINE (ICD-272.4) - currently on diet Rx alone. ~  FLP 1/09 showed TChol 198, TG 127, HDL 34, LDL 138 ~  FLP 6/10 showed TChol 170, TG 96, HDL 40, LDL 111 ~  FLP 6/11 showed TChol 184, TG 104, HDL 43, LDL 120... rec low chol/ low fat  diet. ~  FLP 8/12 (wt=237#) showed TChol 183, TG 97, HDL 43, LDL 121... Not really on diet "I just eat" ~  FLP 8/13 (wt=235#) showed TChol 176, TG 78, HDL 41, LDL 120  DYSPEPSIA (ICD-536.8) - takes PRILOSEC OTC daily... no dysphagia, N/V, abd pain, etc... no prev endo etc... states he really needs it daily & has pain/ difficulty if he stops the Rx. ~  S/p lap chole 7/11 by William West for cholecystitis & stones...  DIVERTICULOSIS OF COLON (ICD-562.10)  ~  He had colonoscopy 1/06 from William West= divertics, tiny polyp (path=norm mucosa), hems... ~  F/u colon 11/11 by William West w/ divertics, hems, sm rectal polyps= fragments of benign mucosa only...  Hx of MONONUCLEOSIS (ICD-075) - in high school... had mono hepatitis... no known sequellae... he also had RMSF at age 70- pretty sick, recovered w/o sequellae. ~  labs have consistently shown normal LFTs x during his cholecystitis presentation 7/11...  RHEUMATOID ARTHRITIS >> Diagnosed w/ sero-pos RA 10/11 & referred to Rheum, William West- on MTX currently 6/wk & occas Pred taper... ~  8/13:  He tells me that William West started ENBREL about 6 weeks ago 7 he is already feeling better (we do not have notes from Rheum).  Hx of DYSPLASTIC NEVUS (ICD-216.9) - removed from base of neck/ upper back by William West/ William West in 1993... ~  8/13:  He is reminded to check w/ Derm at least yearly due to his fair complexion, red hair, sun exposure, etc...  HEALTH MAINTENANCE:  on ASA 81mg  daily... ~  GI:  colonoscopy 1/06 by [facility] w/ f/u due later this yr. ~  GU: neg DRE & PSA here 1/09 & 6/11 ~  Immunizations:  age 67 now> smoker- given PNEUMOVAX 6/10;  given TETANUS shot 6/10; discussed yearly FLU vaccines.   Current Medications, Allergies, Past Medical History, Past Surgical History, Family History, and Social History were reviewed in [employer] record.   Past Surgical History  Procedure Date  . Tonsillectomy and adenoidectomy as a child   . L foot injury age 31    w/ part amputation great toe  . Dysplastic nevus 07/1991    excised from upper back  . Cholecystectomy     laparoscopic    Outpatient Encounter Prescriptions as of 01/12/2012  Medication Sig Dispense Refill  . acetaminophen (TYLENOL ARTHRITIS PAIN) 650 MG CR tablet Take 650 mg by mouth every 8 (eight) hours as needed.        . Ascorbic Acid (VITAMIN C) 500 MG tablet Take 2 tablets by mouth once  daily       . aspirin 81 MG tablet Take 81 mg by mouth daily.        . folic acid (FOLVITE) 800 MCG tablet Take 800 mcg by mouth daily.        [name] ibuprofen (ADVIL,MOTRIN) 200 MG tablet Take 800 mg by mouth. As needed for pain      . methotrexate (RHEUMATREX) 2.5 MG tablet 6 tablets by mouth every Monday       . multivitamin (THERAGRAN) per tablet Take 1 tablet by mouth daily.        [name] omega-3 fish oil (MAXEPA) 1000 MG CAPS capsule Take 1 capsule by mouth daily.        [name] omeprazole (PRILOSEC) 20 MG capsule Take one 30-60 minutes before first meal of the day       . Pseudoephedrine-Ibuprofen 30-200 MG TABS As needed         Allergies  Allergen Reactions  . Penicillins     REACTION: rash    Review of Systems        See HPI - all other systems neg except as noted...        The patient denies anorexia, fever, weight loss, weight gain, vision loss, decreased hearing, hoarseness, chest pain, syncope, dyspnea on exertion, peripheral edema, prolonged cough, headaches, hemoptysis, abdominal pain, melena, hematochezia, severe indigestion/heartburn, hematuria, incontinence, muscle weakness, suspicious skin lesions, transient blindness, difficulty walking, depression, unusual weight change, abnormal bleeding, enlarged lymph nodes, and angioedema.   MS:  Complains of joint pain, joint swelling, loss of strength, and stiffness (all improved on MTX); denies joint redness, low back pain, mid back pain, muscle aches, cramps, and thoracic pain.   Objective:   Physical Exam     WD,  Overweight, 61 y/o WM in NAD... GENERAL:  Alert & oriented; pleasant & cooperative... HEENT:  [place]/AT, EOM-wnl, PERRLA, Fundi-benign, EACs-clear, TMs-wnl, NOSE-clear, THROAT-clear & wnl. NECK:  Supple w/ full ROM; no JVD; normal carotid impulses w/o bruits; no thyromegaly or nodules palpated; no lymphadenopathy. CHEST:  Clear to P & A; without wheezes/ rales/ or rhonchi. HEART:  Regular Rhythm; without murmurs/ rubs/ or gallops. ABDOMEN:  Soft & nontender; s/p lap chole, normal bowel sounds; no organomegaly or masses detected. (RECTAL:  Neg - prostate 2+ & nontender w/o nodules; stool hematest neg) EXT: deformity L great toe, sl swelling & tender MCPs, some IPs, & wrist; no varicose veins/ venous insuffic/ or edema. NEURO:  CN's intact; motor testing normal; sensory testing normal; gait normal & balance OK. DERM:  scar in upper back at base of neck toward R side, no new lesions noted...  RADIOLOGY DATA:  Reviewed in the EPIC EMR & discussed w/ the patient...  LABORATORY DATA:  Reviewed in the EPIC EMR & discussed w/ the patient...   Assessment & Plan:   CPX>  He quit smoking 6/11;  EKG w/ NSR, NSSTTWA, NAD;  Labs look good x persistent LDL ~120 on diet Rx...  CHOL>  As noted w/ LDL ~120 range needs better diet, wt reduction or consider low dose statin rx, he will decide...  Dyspepsia>  On Prilosec daily...  Divertics/ sm polyps>  follwed by William West & up to date on colon screening etc...  Sero-positive RA>  Followed by William West & ENBREL recently started...  Other medical problems as noted...   Patient's Medications  New Prescriptions   LORAZEPAM (ATIVAN) 1 MG TABLET    Take 1/2 to 1 tablet by mouth three times daily  as needed  Previous Medications   ASCORBIC ACID (VITAMIN C) 500 MG TABLET    Take 2 tablets by mouth once daily    ASPIRIN 81 MG TABLET    Take 81 mg by mouth daily.     ETANERCEPT (ENBREL) 50 MG/ML INJECTION    Inject 50 mg into the skin once a week. On mondays    FOLIC ACID (FOLVITE) 800 MCG TABLET    Take 800 mcg by mouth daily.     IBUPROFEN (ADVIL,MOTRIN) 200 MG TABLET    Take 800 mg by mouth. As needed for pain   METHOTREXATE (RHEUMATREX) 2.5 MG TABLET    6 tablets by mouth every Monday    OMEPRAZOLE (PRILOSEC) 20 MG CAPSULE    Take one 30-60 minutes before first meal of the day    PSEUDOEPHEDRINE-IBUPROFEN 30-200 MG TABS    As needed   Modified Medications   No medications on file  Discontinued Medications   ACETAMINOPHEN (TYLENOL ARTHRITIS PAIN) 650 MG CR TABLET    Take 650 mg by mouth every 8 (eight) hours as needed.     MULTIVITAMIN (THERAGRAN) PER TABLET    Take 1 tablet by mouth daily.     OMEGA-3 FISH OIL (MAXEPA) 1000 MG CAPS CAPSULE    Take 1 capsule by mouth daily.

## 2012-01-13 ENCOUNTER — Telehealth: Payer: Self-pay | Admitting: Pulmonary Disease

## 2012-01-13 NOTE — Telephone Encounter (Signed)
Notes Recorded by Michele Mcalpine, MD on 01/13/2012 at 8:45 AM Please notify patient>  FLP not quite at goal on diet alone w/ LDL=120; needs better low chol, low fat diet... Chems, CBC, Thyroid, PSA> All WNL... Notes Recorded by Michele Mcalpine, MD on 01/13/2012 at 8:44 AM Please notify patient>  CXR is essentially neg- NAD...  I spoke with patient about results and he verbalized understanding and had no questions

## 2012-01-14 ENCOUNTER — Encounter: Payer: Self-pay | Admitting: Pulmonary Disease

## 2012-03-26 ENCOUNTER — Emergency Department (HOSPITAL_COMMUNITY)
Admission: EM | Admit: 2012-03-26 | Discharge: 2012-03-26 | Disposition: A | Discharge status: Home / Self Care | Payer: BC Managed Care – PPO | Attending: Emergency Medicine | Admitting: Emergency Medicine

## 2012-03-26 ENCOUNTER — Other Ambulatory Visit: Payer: Self-pay

## 2012-03-26 ENCOUNTER — Encounter (HOSPITAL_COMMUNITY): Payer: Self-pay | Admitting: Emergency Medicine

## 2012-03-26 DIAGNOSIS — Z7982 Long term (current) use of aspirin: Secondary | ICD-10-CM | POA: Insufficient documentation

## 2012-03-26 DIAGNOSIS — Z8739 Personal history of other diseases of the musculoskeletal system and connective tissue: Secondary | ICD-10-CM | POA: Insufficient documentation

## 2012-03-26 DIAGNOSIS — Z87891 Personal history of nicotine dependence: Secondary | ICD-10-CM | POA: Insufficient documentation

## 2012-03-26 DIAGNOSIS — Z79899 Other long term (current) drug therapy: Secondary | ICD-10-CM | POA: Insufficient documentation

## 2012-03-26 DIAGNOSIS — I4891 Unspecified atrial fibrillation: Secondary | ICD-10-CM

## 2012-03-26 DIAGNOSIS — Z8719 Personal history of other diseases of the digestive system: Secondary | ICD-10-CM | POA: Insufficient documentation

## 2012-03-26 DIAGNOSIS — Z8619 Personal history of other infectious and parasitic diseases: Secondary | ICD-10-CM | POA: Insufficient documentation

## 2012-03-26 DIAGNOSIS — E7800 Pure hypercholesterolemia, unspecified: Secondary | ICD-10-CM | POA: Insufficient documentation

## 2012-03-26 DIAGNOSIS — F17200 Nicotine dependence, unspecified, uncomplicated: Secondary | ICD-10-CM | POA: Insufficient documentation

## 2012-03-26 DIAGNOSIS — R002 Palpitations: Secondary | ICD-10-CM | POA: Insufficient documentation

## 2012-03-26 LAB — BASIC METABOLIC PANEL
Calcium: 9.7 mg/dL (ref 8.4–10.5)
GFR calc non Af Amer: 74 mL/min — ABNORMAL LOW (ref >90)
Glucose, Bld: 98 mg/dL (ref 70–99)
Potassium: 3.9 mEq/L (ref 3.5–5.1)
Sodium: 141 mEq/L (ref 135–145)

## 2012-03-26 LAB — TROPONIN I: Troponin I: <0.3 ng/mL (ref <0.30)

## 2012-03-26 LAB — CBC
MCH: 30.3 pg (ref 26.0–34.0)
MCHC: 34.4 g/dL (ref 30.0–36.0)
Platelets: 204 10*3/uL (ref 150–400)
RBC: 5.15 MIL/uL (ref 4.22–5.81)
RDW: 15.1 % (ref 11.5–15.5)

## 2012-03-26 MED ORDER — ONDANSETRON HCL 4 MG/2ML IJ SOLN: 4.0000 mg | Freq: Once | INTRAMUSCULAR | Status: DC

## 2012-03-26 MED ORDER — DILTIAZEM HCL 25 MG/5ML IV SOLN: 20.0000 mg | Freq: Once | INTRAVENOUS | Status: DC

## 2012-03-26 MED ORDER — DILTIAZEM HCL ER COATED BEADS 120 MG PO CP24: 120.0000 mg | ORAL_CAPSULE | Freq: Every day | ORAL | Status: DC

## 2012-03-26 NOTE — ED Provider Notes (Signed)
History     CSN: [id number]  Arrival date & time 03/26/12  0400   First MD Initiated Contact with Patient 03/26/12 0411      Chief Complaint  Patient presents with  . Irregular Heart Beat    (Consider location/radiation/quality/duration/timing/severity/associated sxs/prior treatment) HPI  William West is a 61 y.o. male who presents to the Emergency Department complaining of irregular fast heart beat that began around 0245 this morning. He got up to go to the bathroom x 3,each time voiding a large amount of urine. He noticed his heart rate was faster than usual and irregular. He has had similar episodes over the last two years lasting a few minutes to as long as 4 hours. He has consulted his PCP. He has discontinued all caffeine. He denies shortness of breath, weakness, nausea, chest pain.His last known episodes was in July. He has not seen cardiology. He is not on anticoagulation. He does take an aspirin 81 mg daily.  PCP Dr. Lennon Alstrom   Past Medical History  Diagnosis Date  . Allergic rhinitis   . Dermatitis   . Tobacco use disorder   . Hypercholesterolemia   . Diverticulosis of colon   . Mononucleosis   . Acute arthritis   . Dysplastic nevus     Past Surgical History  Procedure Date  . Tonsillectomy and adenoidectomy as a child  . L foot injury age 50    w/ part amputation great toe  . Dysplastic nevus 07/1991    excised from upper back  . Cholecystectomy     laparoscopic    Family History  Problem Relation Age of Onset  . Prostate cancer Father   . Other Father     pacemaker  . Heart disease Mother   . Other Mother     vascular disease    History  Substance Use Topics  . Smoking status: Former Smoker -- 0.5 packs/day for 45 years    Types: Cigarettes    Quit date: 11/13/2009  . Smokeless tobacco: Not on file  . Alcohol Use: Yes     Comment: social      Review of Systems  Constitutional: Negative for fever.       10 Systems reviewed and are  negative for acute change except as noted in the HPI.  HENT: Negative for congestion.   Eyes: Negative for discharge and redness.  Respiratory: Negative for cough and shortness of breath.   Cardiovascular: Positive for palpitations. Negative for chest pain.  Gastrointestinal: Negative for vomiting and abdominal pain.  Musculoskeletal: Negative for back pain.  Skin: Negative for rash.  Neurological: Negative for syncope, numbness and headaches.  Psychiatric/Behavioral:       No behavior change.    Allergies  Penicillins  Home Medications   Current Outpatient Rx  Name  Route  Sig  Dispense  Refill  . VITAMIN C 500 MG PO TABS      Take 2 tablets by mouth once daily          . ASPIRIN 81 MG PO TABS   Oral   Take 81 mg by mouth daily.           [name] ETANERCEPT 50 MG/ML [place] SOLN   Subcutaneous   Inject 50 mg into the skin once a week. On mondays         . FOLIC ACID 800 MCG PO TABS   Oral   Take 800 mcg by mouth daily.           [name]  IBUPROFEN 200 MG PO TABS   Oral   Take 800 mg by mouth. As needed for pain         . LORAZEPAM 1 MG PO TABS      Take 1/2 to 1 tablet by mouth three times daily as needed   90 tablet   5   . METHOTREXATE 2.5 MG PO TABS      6 tablets by mouth every Monday          . OMEPRAZOLE 20 MG PO CPDR      Take one 30-60 minutes before first meal of the day          . PSEUDOEPHEDRINE-IBUPROFEN 30-200 MG PO TABS      As needed            BP 142/91  Pulse 71  Temp 97.8 F (36.6 C) (Oral)  Resp 16  Ht 6' (1.829 m)  Wt 230 lb (104.327 kg)  BMI 31.19 kg/m2  SpO2 96%  Physical Exam  Nursing note and vitals reviewed. Constitutional: He appears well-developed and well-nourished.       Awake, alert, nontoxic appearance.  HENT:  Head: Atraumatic.  Eyes: Right eye exhibits no discharge. Left eye exhibits no discharge.  Neck: Neck supple.  Cardiovascular:       Rapid irregular rhythm  Pulmonary/Chest: Effort normal and breath  sounds normal. He exhibits no tenderness.  Abdominal: Soft. Bowel sounds are normal. There is no tenderness. There is no rebound.  Musculoskeletal: He exhibits no tenderness.       Baseline ROM, no obvious new focal weakness.  Neurological:       Mental status and motor strength appears baseline for patient and situation.  Skin: No rash noted.  Psychiatric: He has a normal mood and affect.    ED Course  Procedures (including critical care time) Results for orders placed during the hospital encounter of 03/26/12  CBC      Component Value Range   WBC 7.0  4.0 - 10.5 K/uL   RBC 5.15  4.22 - 5.81 MIL/uL   Hemoglobin 15.6  13.0 - 17.0 g/dL   HCT 16.1  09.6 - 04.5 %   MCV 88.0  78.0 - 100.0 fL   MCH 30.3  26.0 - 34.0 pg   MCHC 34.4  30.0 - 36.0 g/dL   RDW 40.9  81.1 - 91.4 %   Platelets 204  150 - 400 K/uL  BASIC METABOLIC PANEL      Component Value Range   Sodium 141  135 - 145 mEq/L   Potassium 3.9  3.5 - 5.1 mEq/L   Chloride 106  96 - 112 mEq/L   CO2 27  19 - 32 mEq/L   Glucose, Bld 98  70 - 99 mg/dL   BUN 16  6 - 23 mg/dL   Creatinine, Ser 7.82  0.50 - 1.35 mg/dL   Calcium 9.7  8.4 - 95.6 mg/dL   GFR calc non Af Amer 74 (*) >90 mL/min   GFR calc Af Amer 86 (*) >90 mL/min  TROPONIN I      Component Value Range   Troponin I <0.30  <0.30 ng/mL     Date: 03/26/2012   0402  Rate: 157  Rhythm: atrial fibrillation and with a rapid ventricular response  QRS Axis: normal  Intervals: atrial fib  ST/T Wave abnormalities: normal  Conduction Disutrbances:atrial fib  Narrative Interpretation:   Old EKG Reviewed: changes noted c/w 01/12/12 atrial fibrillation  is new   #2  Patient converted spontaneously.   Date: 03/26/2012   0418   Rate: 64  Rhythm: normal sinus rhythm  QRS Axis: normal  Intervals: normal  ST/T Wave abnormalities: normal  Conduction Disutrbances:none  Narrative Interpretation:   Old EKG Reviewed: changes noted Now in NSR same as EKG 01/12/2012   MDM    Patient with episodes of atrial fibrillation over the course of the last two years. Converted to NSR without intervention. Will initiate low dose Cardizem. Referral to cardiology.Labs are unremarkable. Dx testing d/w pt and family.  Questions answered.  Verb understanding, agreeable to d/c home with outpt f/u.Pt stable in ED with no significant deterioration in condition.The patient appears reasonably screened and/or stabilized for discharge and I doubt any other medical condition or other [facility] requiring further screening, evaluation, or treatment in the ED at this time prior to discharge.  MDM Reviewed: nursing note and vitals Interpretation: labs and ECG            Nicoletta Dress. Colon Branch, MD 03/26/12 9147

## 2012-03-26 NOTE — ED Notes (Signed)
Patient complaining of irregular heart beat. States he feels as if heart is racing, reports history of this happening before. Reports started at approximately 0245.

## 2012-03-26 NOTE — ED Notes (Signed)
Patient's heart rate initially read 170 on monitor. After obtaining IV access and blood work, patient's heart rate then converted and read from 62-71 on monitor. Dr. Colon Branch notified and second EKG performed.

## 2012-03-27 ENCOUNTER — Telehealth: Payer: Self-pay | Admitting: Pulmonary Disease

## 2012-03-27 DIAGNOSIS — I4891 Unspecified atrial fibrillation: Secondary | ICD-10-CM

## 2012-03-27 NOTE — Telephone Encounter (Signed)
[id number] x1 

## 2012-03-27 NOTE — Telephone Encounter (Signed)
Pt states AP was going to send all of his ED records to [facility] for follow-up. He has never been seen by a cardiologist in the past and wants recs on this from SN. Pls advise.

## 2012-03-27 NOTE — Telephone Encounter (Signed)
Spoke with pt and notified of recs per SN Pt verbalized understanding Referral was sent to [facility]

## 2012-03-27 NOTE — Telephone Encounter (Signed)
Per SN---YES----needs cardiology eval either at [facility] cardiology ( Dr. Ladona Ridgel, Graciela Husbands, Allred) or [facility] --either one is fine.  thanks

## 2012-04-25 ENCOUNTER — Ambulatory Visit (INDEPENDENT_AMBULATORY_CARE_PROVIDER_SITE_OTHER): Payer: BC Managed Care – PPO | Admitting: Internal Medicine

## 2012-04-25 ENCOUNTER — Encounter: Payer: Self-pay | Admitting: Internal Medicine

## 2012-04-25 VITALS — BP 115/72 | HR 78 | Ht 71.5 in | Wt 231.1 lb

## 2012-04-25 DIAGNOSIS — I4891 Unspecified atrial fibrillation: Secondary | ICD-10-CM | POA: Insufficient documentation

## 2012-04-25 MED ORDER — FLECAINIDE ACETATE 100 MG PO TABS: ORAL_TABLET | ORAL | Status: DC

## 2012-04-25 MED ORDER — DILTIAZEM HCL ER COATED BEADS 120 MG PO CP24: 120.0000 mg | ORAL_CAPSULE | Freq: Every day | ORAL | Status: DC

## 2012-04-25 NOTE — Assessment & Plan Note (Signed)
The patient's atrial fibrillation is fairly mild. His symptoms are infrequent. My inclination however is that his spells will increase in frequency and severity. To this end I recommended that he start flecainide in conjunction with his calcium channel blocker. Flecainide will be taken on an as-needed basis and I've instructed him on this.

## 2012-04-25 NOTE — Progress Notes (Signed)
HPI [name] is referred today for evaluation of paroxysmal atrial fibrillation. He was seen in the [facility] emergency room several months ago.  At that time his atrial fibrillation spontaneously terminated. Since then he has one episode approximately once a month. Sometimes a stop on her own, and sometimes it requires medical attention. With his atrial fibrillation he has palpitations and minimal shortness of breath. He also notes pressure his chest which is mild. Allergies  Allergen Reactions  . Caffeine   . Penicillins     REACTION: rash  . Pseudoephedrine-Ibuprofen      Current Outpatient Prescriptions  Medication Sig Dispense Refill  . Ascorbic Acid (VITAMIN C) 500 MG tablet Take 2 tablets by mouth once daily       . aspirin 81 MG tablet Take 81 mg by mouth daily.        [name] diltiazem (CARDIZEM CD) 120 MG 24 hr capsule Take 1 capsule (120 mg total) by mouth daily.  90 capsule  3  . etanercept (ENBREL) 50 MG/ML injection Inject 50 mg into the skin once a week. On mondays      . folic acid (FOLVITE) 800 MCG tablet Take 800 mcg by mouth daily.        [name] ibuprofen (ADVIL,MOTRIN) 200 MG tablet Take 800 mg by mouth. As needed for pain      . LORazepam (ATIVAN) 1 MG tablet Take 1/2 to 1 tablet by mouth three times daily as needed  90 tablet  5  . methotrexate (RHEUMATREX) 2.5 MG tablet Take 2.5 mg by mouth once a week. 4 tablets by mouth every Monday      . Multiple Vitamin (MULTIVITAMIN) tablet Take 1 tablet by mouth daily.      [name] omeprazole (PRILOSEC) 20 MG capsule Take one 30-60 minutes before first meal of the day       . [DISCONTINUED] diltiazem (CARDIZEM CD) 120 MG 24 hr capsule Take 1 capsule (120 mg total) by mouth daily.  30 capsule  3  . [DISCONTINUED] diltiazem (CARDIZEM CD) 120 MG 24 hr capsule Take 1 capsule (120 mg total) by mouth daily.  90 capsule  3  . flecainide (TAMBOCOR) 100 MG tablet Take as directed  60 tablet  1     Past Medical History  Diagnosis Date  .  Allergic rhinitis   . Dermatitis   . Tobacco use disorder   . Hypercholesterolemia   . Diverticulosis of colon   . Mononucleosis   . Acute arthritis   . Dysplastic nevus     ROS:   All systems reviewed and negative except as noted in the HPI.   Past Surgical History  Procedure Date  . Tonsillectomy and adenoidectomy as a child  . L foot injury age 30    w/ part amputation great toe  . Dysplastic nevus 07/1991    excised from upper back  . Cholecystectomy     laparoscopic     Family History  Problem Relation Age of Onset  . Prostate cancer Father   . Other Father     pacemaker  . Heart disease Mother   . Other Mother     vascular disease     History   Social History  . Marital Status: Married    Spouse Name: William West x 38 yrs    Number of Children: 2  . Years of Education: N/A   Occupational History  . [employer]    Social History Main Topics  .  Smoking status: Former Smoker -- 0.5 packs/day for 45 years    Types: Cigarettes    Quit date: 11/13/2009  . Smokeless tobacco: Not on file  . Alcohol Use: Yes     Comment: social  . Drug Use: No  . Sexually Active: Not on file   Other Topics Concern  . Not on file   Social History Narrative   One son with HBPOne son with [facility] return soon     BP 115/72  Pulse 78  Ht 5' 11.5" (1.816 m)  Wt 231 lb 1.9 oz (104.835 kg)  BMI 31.79 kg/m2  Physical Exam:  Well appearing middle-aged man, NAD HEENT: Unremarkable Neck:  No JVD, no thyromegally Lungs:  Clear with no wheezes, rales, or rhonchi. HEART:  Regular rate rhythm, no murmurs, no rubs, no clicks Abd:  soft, positive bowel sounds, no organomegally, no rebound, no guarding Ext:  2 plus pulses, no edema, no cyanosis, no clubbing Skin:  No rashes no nodules Neuro:  CN II through XII intact, motor grossly intact  EKG Normal sinus rhythm with normal axis and intervals  Assess/Plan:

## 2012-04-25 NOTE — Patient Instructions (Signed)
Your physician recommends that you schedule a follow-up appointment in: 4 months with Dr Ladona Ridgel   Your physician has recommended you make the following change in your medication:  1) Take Flecainide 100mg  as directed--- if your heart is out of rhythm for 30 min take 1 100mg  Flecainide and wait 2 hours if still out take an additional tablet, if still out in 2 more hours call you MD

## 2012-07-08 ENCOUNTER — Other Ambulatory Visit: Payer: Self-pay

## 2012-07-25 ENCOUNTER — Ambulatory Visit: Payer: BC Managed Care – PPO | Admitting: Cardiovascular Disease

## 2012-08-17 ENCOUNTER — Telehealth: Payer: Self-pay | Admitting: *Deleted

## 2012-08-17 ENCOUNTER — Emergency Department (HOSPITAL_COMMUNITY)
Admission: EM | Admit: 2012-08-17 | Discharge: 2012-08-17 | Disposition: A | Discharge status: Home / Self Care | Payer: BC Managed Care – PPO | Attending: Emergency Medicine | Admitting: Emergency Medicine

## 2012-08-17 ENCOUNTER — Emergency Department (HOSPITAL_COMMUNITY): Payer: BC Managed Care – PPO

## 2012-08-17 ENCOUNTER — Encounter (HOSPITAL_COMMUNITY): Payer: Self-pay

## 2012-08-17 DIAGNOSIS — Z8709 Personal history of other diseases of the respiratory system: Secondary | ICD-10-CM | POA: Insufficient documentation

## 2012-08-17 DIAGNOSIS — E7800 Pure hypercholesterolemia, unspecified: Secondary | ICD-10-CM | POA: Insufficient documentation

## 2012-08-17 DIAGNOSIS — M069 Rheumatoid arthritis, unspecified: Secondary | ICD-10-CM | POA: Insufficient documentation

## 2012-08-17 DIAGNOSIS — Z7982 Long term (current) use of aspirin: Secondary | ICD-10-CM | POA: Insufficient documentation

## 2012-08-17 DIAGNOSIS — Z79899 Other long term (current) drug therapy: Secondary | ICD-10-CM | POA: Insufficient documentation

## 2012-08-17 DIAGNOSIS — I4892 Unspecified atrial flutter: Secondary | ICD-10-CM

## 2012-08-17 DIAGNOSIS — Z872 Personal history of diseases of the skin and subcutaneous tissue: Secondary | ICD-10-CM | POA: Insufficient documentation

## 2012-08-17 DIAGNOSIS — M129 Arthropathy, unspecified: Secondary | ICD-10-CM | POA: Insufficient documentation

## 2012-08-17 DIAGNOSIS — Z8719 Personal history of other diseases of the digestive system: Secondary | ICD-10-CM | POA: Insufficient documentation

## 2012-08-17 DIAGNOSIS — Z8619 Personal history of other infectious and parasitic diseases: Secondary | ICD-10-CM | POA: Insufficient documentation

## 2012-08-17 DIAGNOSIS — Z87891 Personal history of nicotine dependence: Secondary | ICD-10-CM | POA: Insufficient documentation

## 2012-08-17 DIAGNOSIS — Z8679 Personal history of other diseases of the circulatory system: Secondary | ICD-10-CM | POA: Insufficient documentation

## 2012-08-17 HISTORY — DX: Unspecified atrial fibrillation: I48.91

## 2012-08-17 HISTORY — DX: Rheumatoid arthritis, unspecified: M06.9

## 2012-08-17 LAB — CBC
Platelets: 190 10*3/uL (ref 150–400)
RBC: 5.18 MIL/uL (ref 4.22–5.81)
RDW: 14.9 % (ref 11.5–15.5)
WBC: 6.9 10*3/uL (ref 4.0–10.5)

## 2012-08-17 LAB — URINALYSIS, ROUTINE W REFLEX MICROSCOPIC
Bilirubin Urine: NEGATIVE
Glucose, UA: NEGATIVE mg/dL
Hgb urine dipstick: NEGATIVE
Protein, ur: NEGATIVE mg/dL

## 2012-08-17 LAB — COMPREHENSIVE METABOLIC PANEL
ALT: 24 U/L (ref 0–53)
AST: 22 U/L (ref 0–37)
Albumin: 4.1 g/dL (ref 3.5–5.2)
Alkaline Phosphatase: 62 U/L (ref 39–117)
CO2: 27 mEq/L (ref 19–32)
Chloride: 105 mEq/L (ref 96–112)
GFR calc non Af Amer: 76 mL/min — ABNORMAL LOW (ref >90)
Potassium: 3.8 mEq/L (ref 3.5–5.1)
Sodium: 139 mEq/L (ref 135–145)
Total Bilirubin: 0.7 mg/dL (ref 0.3–1.2)

## 2012-08-17 LAB — TROPONIN I: Troponin I: <0.3 ng/mL (ref <0.30)

## 2012-08-17 MED ORDER — ASPIRIN 81 MG PO CHEW
81.0000 mg | CHEWABLE_TABLET | Freq: Once | ORAL | Status: AC
  Administered 2012-08-17: 81 mg via ORAL

## 2012-08-17 MED ORDER — SODIUM CHLORIDE 0.9 % IV SOLN
20.0000 mL | INTRAVENOUS | Status: DC
  Administered 2012-08-17: 20 mL via INTRAVENOUS

## 2012-08-17 MED ORDER — SODIUM CHLORIDE 0.9 % IV SOLN
Freq: Once | INTRAVENOUS | Status: AC
  Administered 2012-08-17: 500 mL via INTRAVENOUS

## 2012-08-17 MED ORDER — DILTIAZEM HCL 100 MG IV SOLR
5.0000 mg/h | Freq: Once | INTRAVENOUS | Status: AC
  Administered 2012-08-17: 10 mg/h via INTRAVENOUS
  Filled 2012-08-17: qty 100

## 2012-08-17 MED ORDER — ASPIRIN 81 MG PO CHEW
324.0000 mg | CHEWABLE_TABLET | Freq: Once | ORAL | Status: DC
  Filled 2012-08-17: qty 1

## 2012-08-17 MED ORDER — METOPROLOL TARTRATE 1 MG/ML IV SOLN
5.0000 mg | Freq: Once | INTRAVENOUS | Status: AC
  Administered 2012-08-17: 5 mg via INTRAVENOUS
  Filled 2012-08-17: qty 5

## 2012-08-17 NOTE — ED Notes (Signed)
Pt returned to NSR and Effie Shy, MD was notified. Dr. Effie Shy verbally ordered to stop Cardizem drip to see how pt responded.

## 2012-08-17 NOTE — Telephone Encounter (Signed)
Pt walked into office to advise he is not feeling well and has listened to MD Ladona Ridgel advice to take his Flecainide if his heart rate is out of rhythm even after second dose was taken, however pt advised he was on the way to the ER and remembered we are open at 8, reviewed pt chart and noted d/c instructions from MD GT on last OV 04-25-12 as follows:  Your physician recommends that you schedule a follow-up appointment in: 4 months with Dr Ladona Ridgel  Your physician has recommended you make the following change in your medication:  1) Take Flecainide 100mg  as directed--- if your heart is out of rhythm for 30 min take 1 100mg  Flecainide and wait 2 hours if still out take an additional tablet, if still out in 2 more hours call you MD    Pt noted he is weak and pulse is still 136 at this time, based on pt current sxs, and fact that MD Tenny Craw is not currently in the office per clinic does not start until 10am and time pt came in as 7:58am,and the fact that his heart rate has not slowed down with flecainide  this nurse advised for pt to be evaluated in ED, pt agreed, walked pt to ED front desk, gave information collected to staff, was directed to registration desk where pt was checked in and nurse brought out wheelchair to take pt to triage room, pt advised he is ok for me to leave him in the ED nurse care.

## 2012-08-17 NOTE — ED Notes (Signed)
Pt reports that his "heart started beating irregular since 12 midnight", woke him from sleep", denies any sob or chest.  Has taken his flecainide x3 doses with no relief.

## 2012-08-17 NOTE — ED Provider Notes (Signed)
History  This chart was scribed for Flint Melter, MD by Shari Heritage, ED Scribe. The patient was seen in room APA18/APA18. Patient's care was started at 0811.   CSN: [id number]  Arrival date & time 08/17/12  0805   First MD Initiated Contact with Patient 08/17/12 [id number]      Chief Complaint  Patient presents with  . Irregular Heart Beat     The history is provided by the patient. No language interpreter was used.    HPI Comments: MAZEN MARCIN is a 62 y.o. male with history of atrial fibrillation who presents to the Emergency Department complaining of persistent palpitations for the past 12 hours. There is associated urinary frequency. He states that he has taken three doses of flecainide with no relief. Patient denies dizziness, headaches, cough, chest pain, shortness of breath, leg swelling, leg pain or appetite changes. Patient reports that he eliminated caffeine from his diet a few years ago. 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 He hasn't seen his PCP since the fall for his yearly physical. Patient's medical history also includes arthritis, diverticulosis and hypercholesterolemia.  PCP - Alroy Dust Cardiologist - Ladona Ridgel   Past Medical History  Diagnosis Date  . Allergic rhinitis   . Dermatitis   . Tobacco use disorder   . Hypercholesterolemia   . Diverticulosis of colon   . Mononucleosis   . Acute arthritis   . Dysplastic nevus   . A-fib   . RA (rheumatoid arthritis)     Past Surgical History  Procedure Laterality Date  . Tonsillectomy and adenoidectomy  as a child  . L foot injury  age 19    w/ part amputation great toe  . Dysplastic nevus  07/1991    excised from upper back  . Cholecystectomy      laparoscopic    Family History  Problem Relation Age of Onset  . Prostate cancer Father   . Other Father     pacemaker  . Heart disease Mother   . Other Mother     vascular disease    History  Substance  Use Topics  . Smoking status: Former Smoker -- 0.50 packs/day for 45 years    Types: Cigarettes    Quit date: 11/13/2009  . Smokeless tobacco: Not on file  . Alcohol Use: Yes     Comment: social      Review of Systems A complete 10 system review of systems was obtained and all systems are negative except as noted in the HPI and PMH.   Allergies  Caffeine; Penicillins; and Pseudoephedrine-ibuprofen  Home Medications   Current Outpatient Rx  Name  Route  Sig  Dispense  Refill  . Ascorbic Acid (VITAMIN C) 500 MG tablet   Oral   Take 500 mg by mouth daily. Take 2 tablets by mouth once daily         . aspirin 81 MG tablet   Oral   Take 81 mg by mouth daily.           [name] diltiazem (CARDIZEM CD) 120 MG 24 hr capsule   Oral   Take 1 capsule (120 mg total) by mouth daily.   90 capsule   3   . etanercept (ENBREL) 50 MG/ML injection   Subcutaneous   Inject 50 mg into the skin once a week. On mondays         . flecainide (TAMBOCOR)  100 MG tablet      Take as directed   60 tablet   1   . folic acid (FOLVITE) 800 MCG tablet   Oral   Take 800 mcg by mouth daily.           [name] ibuprofen (ADVIL,MOTRIN) 200 MG tablet   Oral   Take 800 mg by mouth. As needed for pain         . LORazepam (ATIVAN) 1 MG tablet      Take 1/2 to 1 tablet by mouth three times daily as needed   90 tablet   5   . methotrexate (RHEUMATREX) 2.5 MG tablet   Oral   Take 2.5 mg by mouth once a week. 4 tablets by mouth every Monday         . Multiple Vitamin (MULTIVITAMIN) tablet   Oral   Take 1 tablet by mouth daily.         [name] omeprazole (PRILOSEC) 20 MG capsule      Take one 30-60 minutes before first meal of the day            Triage Vitals: BP 124/100  Pulse 130  Temp(Src) 97.8 F (36.6 C) (Oral)  Resp 20  SpO2 96%  Physical Exam  Nursing note and vitals reviewed. Constitutional: He is oriented to person, place, and time. He appears well-developed and well-nourished.   HENT:  Head: Normocephalic and atraumatic.  Right Ear: External ear normal.  Left Ear: External ear normal.  Eyes: Conjunctivae and EOM are normal. Pupils are equal, round, and reactive to light.  Neck: Normal range of motion and phonation normal. Neck supple.  Cardiovascular: Normal rate, normal heart sounds and intact distal pulses.  An irregularly irregular rhythm present.  No murmur heard. Pulmonary/Chest: Effort normal and breath sounds normal. He exhibits no bony tenderness.  Abdominal: Soft. Normal appearance. There is no tenderness.  Musculoskeletal: Normal range of motion.  Neurological: He is alert and oriented to person, place, and time. He has normal strength. No cranial nerve deficit or sensory deficit. He exhibits normal muscle tone. Coordination normal.  Skin: Skin is warm, dry and intact.  Psychiatric: He has a normal mood and affect. His behavior is normal. Judgment and thought content normal.    ED Course  Procedures (including critical care time) DIAGNOSTIC STUDIES: Oxygen Saturation is 96% on room air, adequate by my interpretation.    COORDINATION OF CARE: 8:26 AM- Patient informed of current plan for treatment and evaluation and agrees with plan at this time.   8:44 AM- Repeat BP reads 125/90. Have authorized lopressor 5 mg.  10:18 AM- Patient given 500 cc bolus but HR not improved, will give Cardizem bolus, 10 mg, and 10 mg every hour.  10:33 AM- Patient is improved. HR now 88-90.  11:00 AM - Consultation with his cardiologist in [place] to determine the appropriate facility for treatment.  11:44 AM- Spoke with Dr. Ladona Ridgel who will have his office call patient this afternoon to arrange follow up care. Will update patient on current plant and prepare for discharge.   Date: 03/10/2012- 08:13  Rate: 1311  Rhythm: Atrial Flutter with 2:1 block  QRS Axis: normal  PR and QT Intervals: QT prolonged  ST/T Wave abnormalities: nonspecific ST/T changes  PR and  QRS Conduction Disutrbances:QTc prolonged  Narrative Interpretation:   Old EKG Reviewed: rate faster, atrial flutter, new; QT is longer- since11/3/13    Date: 03/10/2012  Rate: 93  Rhythm: atrial  flutter  QRS Axis: normal  PR and QT Intervals: QT and QTC, are normal  ST/T Wave abnormalities: nonspecific ST changes  PR and QRS Conduction Disutrbances:QT, and QTC are normal  Narrative Interpretation:   Old EKG Reviewed: changes noted- rate, from earlier today, is improved    Date: 03/10/2012  Rate: 67  Rhythm: normal sinus rhythm  QRS Axis: normal  PR and QT Intervals: normal  ST/T Wave abnormalities: normal  PR and QRS Conduction Disutrbances:none  Narrative Interpretation:   Old EKG Reviewed: changes noted- resumption of normal sinus rhythm, from atrial flutter with RVR    Filed Vitals:   08/17/12 0857 08/17/12 0929 08/17/12 0956 08/17/12 1028  BP: 114/92 120/84 111/89 119/77  Pulse: 124 122 124 90  Temp:      TempSrc:      Resp: 18 20  18   SpO2: 97% 99% 98% 96%    CRITICAL CARE Performed by: Mancel Bale, MD  Total critical care time:55 minutes  Critical care time was exclusive of separately billable procedures and treating other patients.  Critical care was necessary to treat or prevent imminent or life-threatening deterioration.  Critical care was time spent personally by me on the following activities: development of treatment plan with patient and/or surrogate as well as nursing, discussions with consultants, evaluation of patient's response to treatment, examination of patient, obtaining history from patient or surrogate, ordering and performing treatments and interventions, ordering and review of laboratory studies, ordering and review of radiographic studies, pulse oximetry and re-evaluation of patient's condition.   Medications  0.9 %  sodium chloride infusion (0 mLs Intravenous Stopped 08/17/12 1214)  metoprolol (LOPRESSOR) injection 5 mg (5 mg  Intravenous Given 08/17/12 0849)  aspirin chewable tablet 81 mg (81 mg Oral Given 08/17/12 0846)  0.9 %  sodium chloride infusion ( Intravenous Stopped 08/17/12 0933)  diltiazem (CARDIZEM) 100 mg in dextrose 5 % 100 mL infusion (0 mg/hr Intravenous Stopped 08/17/12 1104)    Labs Reviewed  COMPREHENSIVE METABOLIC PANEL - Abnormal; Notable for the following:    Glucose, Bld 102 (*)    GFR calc non Af Amer 76 (*)    GFR calc Af Amer 89 (*)    All other components within normal limits  CBC  URINALYSIS, ROUTINE W REFLEX MICROSCOPIC  TROPONIN I    Dg Chest Portable 1 View  08/17/2012  *RADIOLOGY REPORT*  Clinical Data: Irregular heart beat  PORTABLE CHEST - 1 VIEW  Comparison: 01/12/2012  Findings: Cardiomediastinal silhouette is stable.  No acute infiltrate or pleural effusion.  No pulmonary edema.  The bony thorax is unremarkable.  IMPRESSION: No active disease.  No significant change.   Original Report Authenticated By: Natasha Mead, M.D.    Nursing Notes Reviewed/ Care Coordinated Applicable Imaging Reviewed Interpretation of Laboratory Data incorporated into ED treatment  1. Atrial flutter with rapid ventricular response       MDM  Atrial flutter with RVR, treated and improved in the ED. He was converted to normal sinus rhythm, chemically. Doubt ACS, PE, or pneumonia. Doubt metabolic instability, serious bacterial infection or impending vascular collapse; the patient is stable for discharge.   Plan: Home Medications- usual; Home Treatments- rest; Recommended follow up- EP cards asap      I personally performed the services described in this documentation, which was scribed in my presence. The recorded information has been reviewed and is accurate.    Flint Melter, MD 08/17/12 1500

## 2012-08-18 ENCOUNTER — Telehealth: Payer: Self-pay | Admitting: Internal Medicine

## 2012-08-18 NOTE — Telephone Encounter (Signed)
William West made patient an appointment for 08/21/12 at 10:30 in [place]

## 2012-08-18 NOTE — Telephone Encounter (Signed)
New problem   Pt was seen in ED yesterday for Afib,medication wasn't working. Pt was told that someone from office would call him to let him know what he need to do. Please call pt concerning this matter.

## 2012-08-21 ENCOUNTER — Ambulatory Visit (INDEPENDENT_AMBULATORY_CARE_PROVIDER_SITE_OTHER): Payer: BC Managed Care – PPO | Admitting: Internal Medicine

## 2012-08-21 ENCOUNTER — Encounter: Payer: Self-pay | Admitting: Internal Medicine

## 2012-08-21 VITALS — BP 128/88 | HR 68 | Ht 72.0 in | Wt 234.0 lb

## 2012-08-21 DIAGNOSIS — I4891 Unspecified atrial fibrillation: Secondary | ICD-10-CM

## 2012-08-21 NOTE — Assessment & Plan Note (Signed)
His paroxysmal atrial fibrillation appears to be increasing slightly in frequency and duration. Today we discussed the treatment options. I've recommended that he take an additional flecainide tablet when he goes into atrial fibrillation for a total of 200 mg. Also I have asked the patient take an additional diltiazem when he was in atrial fibrillation. He is instructed to start taking flecainide 100 mg twice daily if he requires more than 400 mg of flecainide in a week's time. If he does require daily flecainide, he will undergo exercise stress testing. I'll see the patient back in several months.

## 2012-08-21 NOTE — Patient Instructions (Addendum)
Your physician recommends that you schedule a follow-up appointment in: 6 months  Your physician has recommended you make the following change in your medication:    YOU MAY TAKE EXTRA DOSE OF DILTIAZEM AND FLECAINIDE IF YOU EXPERIENCE ATRIAL FIBRILLATION.  IF THIS DOES NOT WORK, YOU MAY TAKE AN ADDITIONAL FLECAINIDE

## 2012-08-21 NOTE — Progress Notes (Signed)
HPI William West returns today for followup. He is a very pleasant middle-age man with paroxysmal atrial fibrillation, and as needed flecainide therapy. He did well until several weeks ago. Since then he has had several episodes of atrial fibrillation. Typically these will last for an hour or 2, but one episode lasted approximately 10 hours. The patient has been on as needed flecainide. He denies chest pain or shortness of breath. When he goes into atrial fibrillation, he feels weak but does not have any of the above symptoms. No peripheral edema. Allergies  Allergen Reactions  . Caffeine   . Penicillins     REACTION: rash  . Pseudoephedrine-Ibuprofen      Current Outpatient Prescriptions  Medication Sig Dispense Refill  . Ascorbic Acid (VITAMIN C) 500 MG tablet Take 500 mg by mouth daily. Take 2 tablets by mouth once daily      . aspirin 81 MG tablet Take 81 mg by mouth daily.        [name] diltiazem (CARDIZEM CD) 120 MG 24 hr capsule Take 1 capsule (120 mg total) by mouth daily.  90 capsule  3  . etanercept (ENBREL) 50 MG/ML injection Inject 50 mg into the skin once a week. On mondays      . flecainide (TAMBOCOR) 100 MG tablet Take as directed  60 tablet  1  . folic acid (FOLVITE) 800 MCG tablet Take 800 mcg by mouth daily.        [name] ibuprofen (ADVIL,MOTRIN) 200 MG tablet Take 800 mg by mouth. As needed for pain      . LORazepam (ATIVAN) 1 MG tablet Take 1/2 to 1 tablet by mouth three times daily as needed  90 tablet  5  . methotrexate (RHEUMATREX) 2.5 MG tablet Take 2.5 mg by mouth once a week. 4 tablets by mouth every Monday      . Multiple Vitamin (MULTIVITAMIN) tablet Take 1 tablet by mouth daily.      [name] omeprazole (PRILOSEC) 20 MG capsule Take one 30-60 minutes before first meal of the day        No current facility-administered medications for this visit.     Past Medical History  Diagnosis Date  . Allergic rhinitis   . Dermatitis   . Tobacco use disorder   . Hypercholesterolemia    . Diverticulosis of colon   . Mononucleosis   . Acute arthritis   . Dysplastic nevus   . A-fib   . RA (rheumatoid arthritis)     ROS:   All systems reviewed and negative except as noted in the HPI.   Past Surgical History  Procedure Laterality Date  . Tonsillectomy and adenoidectomy  as a child  . L foot injury  age 13    w/ part amputation great toe  . Dysplastic nevus  07/1991    excised from upper back  . Cholecystectomy      laparoscopic     Family History  Problem Relation Age of Onset  . Prostate cancer Father   . Other Father     pacemaker  . Heart disease Mother   . Other Mother     vascular disease     History   Social History  . Marital Status: Married    Spouse Name: Larita Fife x 38 yrs    Number of Children: 2  . Years of Education: N/A   Occupational History  . [employer]    Social History Main Topics  . Smoking status: Former  Smoker -- 0.50 packs/day for 45 years    Types: Cigarettes    Quit date: 11/13/2009  . Smokeless tobacco: Not on file  . Alcohol Use: Yes     Comment: social  . Drug Use: No  . Sexually Active: No   Other Topics Concern  . Not on file   Social History Narrative   One son with HBP   One son with IDDM      May return soon     BP 128/88  Pulse 68  Ht 6' (1.829 m)  Wt 234 lb (106.142 kg)  BMI 31.73 kg/m2  SpO2 95%  Physical Exam:  Well appearing 62 year old man,NAD HEENT: Unremarkable Neck:  No JVD, no thyromegally Lungs:  Clear with no wheezes, rales, or rhonchi. HEART:  Regular rate rhythm, no murmurs, no rubs, no clicks Abd:  soft, positive bowel sounds, no organomegally, no rebound, no guarding Ext:  2 plus pulses, no edema, no cyanosis, no clubbing Skin:  No rashes no nodules Neuro:  CN II through XII intact, motor grossly intact  Assess/Plan:

## 2012-09-18 ENCOUNTER — Ambulatory Visit: Payer: BC Managed Care – PPO | Admitting: Internal Medicine

## 2012-10-30 ENCOUNTER — Telehealth: Payer: Self-pay | Admitting: Pulmonary Disease

## 2012-10-30 MED ORDER — LEVOFLOXACIN 500 MG PO TABS: 500.0000 mg | ORAL_TABLET | Freq: Every day | ORAL | Status: DC

## 2012-10-30 MED ORDER — METHYLPREDNISOLONE 4 MG PO KIT: PACK | ORAL | Status: DC

## 2012-10-30 NOTE — Telephone Encounter (Signed)
I spoke with pt. He c/o cough w/ green phlem, nasal congestion, fever up to 100.0, PND, runny nose x wed but worsened x friday night. He is taking mucinex 1 tab BID w. Plenty of fluids and using a cool mist vaporizer at night. He can't take antihistamines d/t his AFIB. Pt is requesting further recs. Please advise SN thanks Last OV 01/12/12 No pending appt.  Allergies  Allergen Reactions  . Caffeine   . Penicillins     REACTION: rash  . Pseudoephedrine-Ibuprofen

## 2012-10-30 NOTE — Telephone Encounter (Signed)
Per SN---  levaquin 500 mg  #7  1 daily Medrol dosepak  #1   Increase the mucinex 2 po bid Increase fluids Nasal saline spray every 1-2 hours while awake.

## 2012-10-30 NOTE — Telephone Encounter (Signed)
Pt is aware of recs. rx has been sent. Nothing further was needed

## 2012-11-15 ENCOUNTER — Telehealth: Payer: Self-pay | Admitting: Internal Medicine

## 2012-11-15 ENCOUNTER — Telehealth: Payer: Self-pay | Admitting: *Deleted

## 2012-11-15 NOTE — Telephone Encounter (Signed)
Discussed with Dr Ladona Ridgel, patient is going to take Diltiazem 120mg  now and if still out of rhythm at 4pm will take an additional Flecainide 100mg .  I have spoken with the patient.  The question is what is this doesn't help and he is still out of rhythm at 6pm tonight.  What should he do then.  I told him I would discuss with Dr Ladona Ridgel and call him back.

## 2012-11-15 NOTE — Telephone Encounter (Signed)
Follow up    Patient is out of town in [place]. Went to [employer] department heart rate is  161 & [facility]  167  Patient purchase a brand new blood pressure machine that shows heart rate 164  Previous blood pressure was  100/80. No er visit.    @ 10 am took two flecainide, and one diltiazem   B/p now 119/89. Heart rate  136.    1. Should additional medication be taken  Or go to nearest emergency room.

## 2012-11-15 NOTE — Telephone Encounter (Signed)
Pt & wife call today b/c pt went out of rhythm around 12:45 this am. At 1 am he took a Diltiazem & aspirin At 8:30am he took another diltiazem  [facility]  ([place]                           Checked  bp  119/89   HR 164 Walked over to fire department                                  bp  110/85   HR  161  At 10:15 pt took 2 flecainide                                    Wife states he is breathing hard. Pt states he is a little short of breath but " I'm feeling pretty good"  Pt would like to know if he should go ahead and take more flecainide?     bp    120/86  HR   134 Will take to Dr. Ladona Ridgel for review  Mylo Red RN

## 2012-11-15 NOTE — Telephone Encounter (Signed)
Patient has converted to NSR and is going to continue his medications as prescribed

## 2012-11-15 NOTE — Telephone Encounter (Signed)
Recieved a phone call from Dennis Bast to advise DR GT the following:   Patient is out of town. Pt took 120 mg of diltiazem and is also taking Flecainide as needed. Needs a plan for patient if he does not go back into rhythm.  Unable to discuss with DR GT due to running late for company meeting. KL made aware via telephone

## 2012-11-15 NOTE — Telephone Encounter (Signed)
New Prob     States pt is currently in afib and would like to speak to nurse regarding this. Please call.

## 2013-01-12 ENCOUNTER — Ambulatory Visit (INDEPENDENT_AMBULATORY_CARE_PROVIDER_SITE_OTHER): Payer: BC Managed Care – PPO | Admitting: Pulmonary Disease

## 2013-01-12 ENCOUNTER — Other Ambulatory Visit (INDEPENDENT_AMBULATORY_CARE_PROVIDER_SITE_OTHER): Payer: BC Managed Care – PPO

## 2013-01-12 ENCOUNTER — Other Ambulatory Visit: Payer: Self-pay | Admitting: Pulmonary Disease

## 2013-01-12 ENCOUNTER — Encounter: Payer: Self-pay | Admitting: Pulmonary Disease

## 2013-01-12 VITALS — BP 120/78 | HR 58 | Temp 98.3°F | Ht 72.0 in | Wt 234.0 lb

## 2013-01-12 DIAGNOSIS — E785 Hyperlipidemia, unspecified: Secondary | ICD-10-CM

## 2013-01-12 DIAGNOSIS — I4891 Unspecified atrial fibrillation: Secondary | ICD-10-CM

## 2013-01-12 DIAGNOSIS — D239 Other benign neoplasm of skin, unspecified: Secondary | ICD-10-CM

## 2013-01-12 DIAGNOSIS — M069 Rheumatoid arthritis, unspecified: Secondary | ICD-10-CM

## 2013-01-12 DIAGNOSIS — Z0000 Encounter for general adult medical examination without abnormal findings: Secondary | ICD-10-CM

## 2013-01-12 DIAGNOSIS — K5730 Diverticulosis of large intestine without perforation or abscess without bleeding: Secondary | ICD-10-CM

## 2013-01-12 DIAGNOSIS — K3189 Other diseases of stomach and duodenum: Secondary | ICD-10-CM

## 2013-01-12 LAB — LIPID PANEL
LDL Cholesterol: 109 mg/dL — ABNORMAL HIGH (ref 0–99)
Total CHOL/HDL Ratio: 4
VLDL: 15.6 mg/dL (ref 0.0–40.0)

## 2013-01-12 LAB — CBC WITH DIFFERENTIAL/PLATELET
Eosinophils Relative: 2.4 % (ref 0.0–5.0)
HCT: 42.5 % (ref 39.0–52.0)
Hemoglobin: 14.6 g/dL (ref 13.0–17.0)
Lymphs Abs: 1.6 10*3/uL (ref 0.7–4.0)
MCV: 87.6 fl (ref 78.0–100.0)
Monocytes Absolute: 0.6 10*3/uL (ref 0.1–1.0)
Neutro Abs: 4 10*3/uL (ref 1.4–7.7)
Platelets: 215 10*3/uL (ref 150.0–400.0)
RDW: 15.9 % — ABNORMAL HIGH (ref 11.5–14.6)
WBC: 6.4 10*3/uL (ref 4.5–10.5)

## 2013-01-12 LAB — TSH: TSH: 1.9 u[IU]/mL (ref 0.35–5.50)

## 2013-01-12 LAB — BASIC METABOLIC PANEL
GFR: 78.78 mL/min (ref >60.00)
Potassium: 4.8 mEq/L (ref 3.5–5.1)
Sodium: 140 mEq/L (ref 135–145)

## 2013-01-12 LAB — HEPATIC FUNCTION PANEL
AST: 20 U/L (ref 0–37)
Alkaline Phosphatase: 53 U/L (ref 39–117)
Total Bilirubin: 0.9 mg/dL (ref 0.3–1.2)

## 2013-01-12 MED ORDER — LORAZEPAM 1 MG PO TABS: ORAL_TABLET | ORAL | Status: DC

## 2013-01-12 NOTE — Patient Instructions (Addendum)
Today we updated your med list in our EPIC system...    Continue your current medications the same...  Today we did your folow up FASTING blood work...    We will contact you w/ the results when available...   Let's work on diet & exercise...    The goal is to lose 15-20 lbs!  Call for any questions...  Let's plan a follow up visit in 96yr, sooner if needed for problems.Marland KitchenMarland Kitchen

## 2013-01-12 NOTE — Progress Notes (Addendum)
Subjective:    Patient ID: William West, male    DOB: 22-Sep-1950, 62 y.o.   MRN: [id number]  HPI 62 y/o WM, husb of William West, here for a follow up visit & CPX...  ~  January 06, 2011:  67mo ROV & he is under the care of [name] for Rheum w/ RA on MTX 6tabs/wk & occas needs Pred taper> doing well overall;  He had a bout of sinusitis 7/12 treated w/ ZPak, Mucinex, Hydromet & improved;  Now c/o vague intermittent right flank area discomfort x 2wks (no n/v, no ch in BMs, etc);  He reports f/u colonoscopy by [facility] 11/11 & it was OK by his report, told to f/u in 53yrs;  Meds reviewed, labs OK, see below >>  ~  January 12, 2012:  Yearly ROV & CPX>  William West's CC is insomnia (hard to fall asleep & stay asleep) but he tried relatives Lorazepam w/ good result & wonders if this would be ok for him to take... He also c/o intermittent palpitations, racing, & this was worse after recent bee sting; we discussed catecholamine excess 7 avoiding caffeine, nicotine, pseudophed, etc; he used some Benedryl & notes improvement w/ dietary adjustment he says...      Ex-smoker> he quit in 2011 & has remained off cigs; breathing is good he says, no issues; exerc w/ yard work, splits wood, etc...    Chol> on diet alone & wt=235# (unchanged); FLP is fair w/ TChol 176, TG 78, HDL 41, LDL 120; we reviewed diet & wt reduction strategies...    Dyspepsia> on Prilosec 20mg /d & this helps to control his symptoms; he saw [name] 11/12 at [facility] for RUQ pain after lifting- CT was neg & the pain was felt to be musculoskeletal pain; s/p lap chole 7/11 for stones...    Divertics> followed by [facility] for GI; colon 2011 was neg & f/u should be 7-12yrs...    RA> followed by [name] & recently started on ENBREL & still on the MTX, Advil, tylenol, etc...    Hx dysplastic nevus, fair skinned/ red hair> advised to get Derm eval at least yearly... We reviewed prob list, meds, xrays and labs> see below>> CXR 8/13 showed normal heart  size, clear lungs, low lung vols & sl elev of right hemidiaph, s/p GB... EKG 8/13 showed NSR, rate62, wnl, NAD... LABS 8/13:  FLP- ok x LDL=120 on diet alone;  Chems- wnl;  CBC- wnl;  TSH=1.81;  PSA=0.62   ~  January 12, 2013:  Yearly ROV & CPX... William West developed palpit & went to the ER 11/13 w/ AFib, converted w/ CCB; established w/ [name] who treated him w/ Cardizem & Flecainide (prn dosing); he has had infreq episodes & last saw [name] 3/14- note reviewed... He is also followed by [name] for RA on Enbrel (50mg  sq weekly) & MTX 2.5mg - 4tabs/wk, Folic 1mg /d; doing very well by all accounts, we discussed diet, exercise, wt reduction... We reviewed the following medical problems during today's office visit >>     Ex-smoker> he quit in 2011 & has remained off cigs; breathing is good he says, no issues; exerc w/ yard work, splits wood, etc...    PAF> on Diltiazem120 & Flecainide prn per [name]; see above...    Chol> on diet alone & wt=234# (unchanged); FLP is improved w/ TChol 166, TG 78, HDL 41, LDL 109; we reviewed diet & wt reduction strategies...    Dyspepsia> on Prilosec 20mg /d & this helps to control his symptoms; he  saw [name] 11/12 at [facility] for RUQ pain after lifting- CT was neg & the pain was felt to be musculoskeletal pain; s/p lap chole 7/11 for stones...    Divertics> followed by [facility] for GI; colon 2011 was neg & f/u should be 7-43yrs...    RA> followed by [name] on Enbrel & MTX (currently 4/wk)- this has really helped & doing very well on Rx...    Hx dysplastic nevus, fair skinned/ red hair> advised to get Derm eval at least yearly & he promises to do so... We reviewed prob list, meds, xrays and labs> see below for updates >>  LABS 8/14:  FLP- at goals on diet x LDL=109;  Chems- wnl;  CBC- wnl;  TSH=1.90;  PSA=0.67...          Problem List:     Ex-CIGARETTE SMOKER (ICD-305.1) - started as teen, up to 1/2 ppd, quit for up to 48yrs, recently 1/2 ppd but he says he quit 6/11  & has remained quit!  No hx resp tract diseases...  ~  baseline CXRs showed clear, NAD.[name]. ~  CXR 7/11 showed essent clear lungs, +gallstone seen in right abd... ~  CXR 8/13 showed normal heart size, clear lungs, low lung vols & sl elev of right hemidiaph, s/p GB...  HYPERCHOLESTEROLEMIA, BORDERLINE (ICD-272.4) - currently on diet Rx alone. ~  FLP 1/09 showed TChol 198, TG 127, HDL 34, LDL 138 ~  FLP 6/10 showed TChol 170, TG 96, HDL 40, LDL 111 ~  FLP 6/11 showed TChol 184, TG 104, HDL 43, LDL 120... rec low chol/ low fat diet. ~  FLP 8/12 (wt=237#) showed TChol 183, TG 97, HDL 43, LDL 121... Not really on diet "I just eat" ~  FLP 8/13 (wt=235#) showed TChol 176, TG 78, HDL 41, LDL 120 ~  FLP 8/14 on diet alone (wt=234#) showed TChol 166, TG 78, HDL 41, LDL 109  DYSPEPSIA (ICD-536.8) - takes PRILOSEC OTC daily... no dysphagia, N/V, abd pain, etc... no prev endo etc... states he really needs it daily & has pain/ difficulty if he stops the Rx. ~  S/p lap chole 7/11 by [name] for cholecystitis & stones...  DIVERTICULOSIS OF COLON (ICD-562.10)  ~  He had colonoscopy 1/06 from DrMann= divertics, tiny polyp (path=norm mucosa), hems... ~  F/u colon 11/11 by DrNat-Mann w/ divertics, hems, sm rectal polyps= fragments of benign mucosa only...  Hx of MONONUCLEOSIS (ICD-075) - in high school... had mono hepatitis... no known sequellae... he also had RMSF at age 89- pretty sick, recovered w/o sequellae. ~  labs have consistently shown normal LFTs x during his cholecystitis presentation 7/11...  RHEUMATOID ARTHRITIS >> Diagnosed w/ sero-pos RA 10/11 & referred to Rheum, [name]- on MTX currently 6/wk & occas Pred taper... ~  8/13:  He tells me that [name] started ENBREL about 6 weeks ago 7 he is already feeling better (we do not have notes from Rheum). ~  8/14:  followed by [name] on Enbrel & MTX (currently 4/wk)- this has really helped & doing very well on Rx...  Hx of DYSPLASTIC NEVUS  (ICD-216.9) - removed from base of neck/ upper back by DrHensel/ DrCrowe in 1993... ~  8/13:  He is reminded to check w/ Derm at least yearly due to his fair complexion, red hair, sun exposure, etc... ~  8/14:  He has not yet followed up w/ derm & promises to do so soon...  HEALTH MAINTENANCE:  on ASA 81mg  daily... ~  GI:  colonoscopy 1/06 &  11/11 by [facility] as above... ~  GU: neg DRE & PSA here remains wnl... ~  Immunizations:  age 32 now> exsmoker- given PNEUMOVAX 6/10;  given TETANUS shot 6/10; discussed yearly FLU vaccines.   Current Medications, Allergies, Past Medical History, Past Surgical History, Family History, and Social History were reviewed in [employer] record.   Past Surgical History  Procedure Laterality Date  . Tonsillectomy and adenoidectomy  as a child  . L foot injury  age 28    w/ part amputation great toe  . Dysplastic nevus  07/1991    excised from upper back  . Cholecystectomy      laparoscopic    Outpatient Encounter Prescriptions as of 01/12/2013  Medication Sig Dispense Refill  . Ascorbic Acid (VITAMIN C) 500 MG tablet Take 500 mg by mouth daily. Take 2 tablets by mouth once daily      . aspirin 81 MG tablet Take 81 mg by mouth daily.        [name] diltiazem (CARDIZEM CD) 120 MG 24 hr capsule Take 1 capsule (120 mg total) by mouth daily.  90 capsule  3  . etanercept (ENBREL) 50 MG/ML injection Inject 50 mg into the skin once a week. On mondays      . flecainide (TAMBOCOR) 100 MG tablet Take as directed  60 tablet  1  . folic acid (FOLVITE) 800 MCG tablet Take 800 mcg by mouth daily.        [name] ibuprofen (ADVIL,MOTRIN) 200 MG tablet Take 800 mg by mouth. As needed for pain      . LORazepam (ATIVAN) 1 MG tablet Take 1/2 to 1 tablet by mouth three times daily as needed  90 tablet  5  . methotrexate (RHEUMATREX) 2.5 MG tablet Take 2.5 mg by mouth once a week. 4 tablets by mouth every Monday      . Multiple Vitamin (MULTIVITAMIN) tablet Take 1  tablet by mouth daily.      [name] omeprazole (PRILOSEC) 20 MG capsule Take one 30-60 minutes before first meal of the day       . [DISCONTINUED] levofloxacin (LEVAQUIN) 500 MG tablet Take 1 tablet (500 mg total) by mouth daily.  7 tablet  0  . [DISCONTINUED] methylPREDNISolone (MEDROL, PAK,) 4 MG tablet Take as directed  21 tablet  0   No facility-administered encounter medications on file as of 01/12/2013.    Allergies  Allergen Reactions  . Caffeine   . Penicillins     REACTION: rash  . Pseudoephedrine-Ibuprofen     Review of Systems        See HPI - all other systems neg except as noted...        The patient denies anorexia, fever, weight loss, weight gain, vision loss, decreased hearing, hoarseness, chest pain, syncope, dyspnea on exertion, peripheral edema, prolonged cough, headaches, hemoptysis, abdominal pain, melena, hematochezia, severe indigestion/heartburn, hematuria, incontinence, muscle weakness, suspicious skin lesions, transient blindness, difficulty walking, depression, unusual weight change, abnormal bleeding, enlarged lymph nodes, and angioedema.   MS:  Complains of joint pain, joint swelling, loss of strength, and stiffness (all improved on MTX); denies joint redness, low back pain, mid back pain, muscle aches, cramps, and thoracic pain.   Objective:   Physical Exam     WD, Overweight, 62 y/o WM in NAD... GENERAL:  Alert & oriented; pleasant & cooperative... HEENT:  [place]/AT, EOM-wnl, PERRLA, Fundi-benign, EACs-clear, TMs-wnl, NOSE-clear, THROAT-clear & wnl. NECK:  Supple w/ full ROM; no JVD;  normal carotid impulses w/o bruits; no thyromegaly or nodules palpated; no lymphadenopathy. CHEST:  Clear to P & A; without wheezes/ rales/ or rhonchi. HEART:  Regular Rhythm; without murmurs/ rubs/ or gallops. ABDOMEN:  Soft & nontender; s/p lap chole, normal bowel sounds; no organomegaly or masses detected. (RECTAL:  Neg - prostate 2+ & nontender w/o nodules; stool hematest  neg) EXT: deformity L great toe, sl swelling & tender MCPs, some IPs, & wrist; no varicose veins/ venous insuffic/ or edema. NEURO:  CN's intact; motor testing normal; sensory testing normal; gait normal & balance OK. DERM:  scar in upper back at base of neck toward R side, no new lesions noted...  RADIOLOGY DATA:  Reviewed in the EPIC EMR & discussed w/ the patient...  LABORATORY DATA:  Reviewed in the EPIC EMR & discussed w/ the patient...   Assessment & Plan:    CPX>  He quit smoking 6/11;  Baseline EKG w/ NSR, NSSTTWA, NAD;  Baseline CXR = NAD; Labs look good as well...  PAF>  New prob 11/13- followed by [name] on Cardizem & Flecainide prn...  CHOL>  As noted w/ LDL down to 109 - needs better diet, wt reduction or consider low dose statin rx, he will decide...  Dyspepsia>  On Prilosec daily...  Divertics/ sm polyps>  follwed by DrNat-Mann & up to date on colon screening etc...  Sero-positive RA>  Followed by [name] on ENBREL & MTX/ Folic...  Other medical problems as noted...   Patient's Medications  New Prescriptions   No medications on file  Previous Medications   ASCORBIC ACID (VITAMIN C) 500 MG TABLET    Take 500 mg by mouth daily. Take 2 tablets by mouth once daily   ASPIRIN 81 MG TABLET    Take 81 mg by mouth daily.     DILTIAZEM (CARDIZEM CD) 120 MG 24 HR CAPSULE    Take 1 capsule (120 mg total) by mouth daily.   ETANERCEPT (ENBREL) 50 MG/ML INJECTION    Inject 50 mg into the skin once a week. On mondays   FLECAINIDE (TAMBOCOR) 100 MG TABLET    Take as directed   FOLIC ACID (FOLVITE) 800 MCG TABLET    Take 800 mcg by mouth daily.     IBUPROFEN (ADVIL,MOTRIN) 200 MG TABLET    Take 800 mg by mouth. As needed for pain   METHOTREXATE (RHEUMATREX) 2.5 MG TABLET    Take 2.5 mg by mouth once a week. 4 tablets by mouth every Monday   MULTIPLE VITAMIN (MULTIVITAMIN) TABLET    Take 1 tablet by mouth daily.   OMEPRAZOLE (PRILOSEC) 20 MG CAPSULE    Take one 30-60 minutes  before first meal of the day   Modified Medications   Modified Medication Previous Medication   LORAZEPAM (ATIVAN) 1 MG TABLET LORazepam (ATIVAN) 1 MG tablet      Take 1/2 to 1 tablet by mouth three times daily as needed    Take 1/2 to 1 tablet by mouth three times daily as needed  Discontinued Medications   LEVOFLOXACIN (LEVAQUIN) 500 MG TABLET    Take 1 tablet (500 mg total) by mouth daily.   METHYLPREDNISOLONE (MEDROL, PAK,) 4 MG TABLET    Take as directed

## 2013-01-29 ENCOUNTER — Telehealth: Payer: Self-pay | Admitting: Pulmonary Disease

## 2013-01-29 DIAGNOSIS — D239 Other benign neoplasm of skin, unspecified: Secondary | ICD-10-CM

## 2013-01-29 NOTE — Telephone Encounter (Signed)
ATC patient, no answer LMOMTCB 

## 2013-01-30 NOTE — Telephone Encounter (Signed)
Per SN---  Refer to [facility] dermatology for eval.  thanks

## 2013-01-30 NOTE — Telephone Encounter (Signed)
Called, spoke with pt - Pt states SN mentioned during cpx that he should think about maybe seeing a dermatologist for follow up d/t having a fair complexion and having a spot removed from neck in the past.  He would like to proceed with seeing a dermatologist now.  He would like to know who SN recs.  He hasn't seen a dermatologist in 25 years.  Dr. Kriste Basque, pls advise.  Thank you.   ** Please leave detailed msg when calling pt back if he doesn't answer per his request.  Thank you.

## 2013-01-30 NOTE — Telephone Encounter (Signed)
ATC patient to inform order has been placed for dermatology referral. No answer, left detailed message on machine as requested. Nothing further at this time

## 2013-02-19 ENCOUNTER — Ambulatory Visit (INDEPENDENT_AMBULATORY_CARE_PROVIDER_SITE_OTHER): Payer: BC Managed Care – PPO | Admitting: Internal Medicine

## 2013-02-19 ENCOUNTER — Encounter: Payer: Self-pay | Admitting: Internal Medicine

## 2013-02-19 VITALS — BP 130/79 | HR 68 | Ht 72.0 in | Wt 236.0 lb

## 2013-02-19 DIAGNOSIS — I4891 Unspecified atrial fibrillation: Secondary | ICD-10-CM

## 2013-02-19 NOTE — Progress Notes (Signed)
HPI [name] returns today for followup. He is a pleasant 62 yo man with PAF, HTN, and arthritis. In the interim he has done well utilizing a strategy of a pill in the pocket for control of his atrial fibrillation. He denies chest pain or sob. He has had rare palpitations and no hospitalizations for atrial fibrillation. He has questions regarding the optimal dosing of his flecainide.  Allergies  Allergen Reactions  . Caffeine   . Penicillins     REACTION: rash  . Pseudoephedrine-Ibuprofen      Current Outpatient Prescriptions  Medication Sig Dispense Refill  . Ascorbic Acid (VITAMIN C) 500 MG tablet Take 500 mg by mouth daily. Take 2 tablets by mouth once daily      . aspirin 81 MG tablet Take 81 mg by mouth daily.        [name] diltiazem (CARDIZEM CD) 120 MG 24 hr capsule Take 1 capsule (120 mg total) by mouth daily.  90 capsule  3  . etanercept (ENBREL) 50 MG/ML injection Inject 50 mg into the skin once a week. On mondays      . flecainide (TAMBOCOR) 100 MG tablet Take as directed  60 tablet  1  . folic acid (FOLVITE) 800 MCG tablet Take 800 mcg by mouth daily.        [name] ibuprofen (ADVIL,MOTRIN) 200 MG tablet Take 800 mg by mouth. As needed for pain      . LORazepam (ATIVAN) 1 MG tablet Take 1/2 to 1 tablet by mouth three times daily as needed  90 tablet  5  . methotrexate (RHEUMATREX) 2.5 MG tablet Take 2.5 mg by mouth once a week. 4 tablets by mouth every Monday      . Multiple Vitamin (MULTIVITAMIN) tablet Take 1 tablet by mouth daily.      [name] omeprazole (PRILOSEC) 20 MG capsule Take one 30-60 minutes before first meal of the day        No current facility-administered medications for this visit.     Past Medical History  Diagnosis Date  . Allergic rhinitis   . Dermatitis   . Tobacco use disorder   . Hypercholesterolemia   . Diverticulosis of colon   . Mononucleosis   . Acute arthritis   . Dysplastic nevus   . A-fib   . RA (rheumatoid arthritis)     ROS:   All systems  reviewed and negative except as noted in the HPI.   Past Surgical History  Procedure Laterality Date  . Tonsillectomy and adenoidectomy  as a child  . L foot injury  age 31    w/ part amputation great toe  . Dysplastic nevus  07/1991    excised from upper back  . Cholecystectomy      laparoscopic     Family History  Problem Relation Age of Onset  . Prostate cancer Father   . Other Father     pacemaker  . Heart disease Mother   . Other Mother     vascular disease     History   Social History  . Marital Status: Married    Spouse Name: William West x 38 yrs    Number of Children: 2  . Years of Education: N/A   Occupational History  . [employer]    Social History Main Topics  . Smoking status: Former Smoker -- 0.50 packs/day for 45 years    Types: Cigarettes    Quit date: 11/13/2009  . Smokeless tobacco: Not on  file  . Alcohol Use: Yes     Comment: social  . Drug Use: No  . Sexual Activity: No   Other Topics Concern  . Not on file   Social History Narrative   One son with HBP   One son with IDDM      May return soon     BP 130/79  Pulse 68  Ht 6' (1.829 m)  Wt 236 lb (107.049 kg)  BMI 32 kg/m2  Physical Exam:  Well appearing middle aged man, NAD HEENT: Unremarkable Neck:  6 cm JVD, no thyromegally Back:  No CVA tenderness Lungs:  Clear with no wheezes, rales, or rhonchi HEART:  Regular rate rhythm, no murmurs, no rubs, no clicks Abd:  soft, positive bowel sounds, no organomegally, no rebound, no guarding Ext:  2 plus pulses, no edema, no cyanosis, no clubbing Skin:  No rashes no nodules Neuro:  CN II through XII intact, motor grossly intact  EKG - nsr   DEVICE  Normal device function.  See PaceArt for details.   Assess/Plan:

## 2013-02-19 NOTE — Patient Instructions (Addendum)
Your physician recommends that you schedule a follow-up appointment in: ONE YEAR WITH DR GT  Your physician has recommended you make the following change in your medication:   1) TAKE 2 FLECAINIDE TABLETS IF YOUR HEART IS NOTED OUT OF RHYTHM, IF YOUR HEART IS STILL OUT OF RHYTHM IN ONE HOUR TAKE ONE DILTIAZEM 120MG  CAPSULE, IF YOUR HEART IS STILL OUT OF RHYTHM IN TWO HOURS TAKE ONE FLECAINIDE TABLET, DO NOT EXCEED 300MG  OF FLECAINIDE IN A 24 HOUR PERIOD

## 2013-02-19 NOTE — Assessment & Plan Note (Signed)
The patient is maintaining NSR using a pill in the pocket approach with flecainide. He may take up to 300 mg daily in divided doses. He may ultimately require daily flecainide but I do not think at this time he has enough atrial fib to warrant this daily dosing.

## 2013-02-26 ENCOUNTER — Other Ambulatory Visit: Payer: Self-pay | Admitting: Dermatology

## 2013-03-29 ENCOUNTER — Other Ambulatory Visit: Payer: Self-pay

## 2013-04-09 ENCOUNTER — Other Ambulatory Visit: Payer: Self-pay | Admitting: Internal Medicine

## 2013-10-31 ENCOUNTER — Emergency Department (HOSPITAL_COMMUNITY)
Admission: EM | Admit: 2013-10-31 | Discharge: 2013-11-01 | Disposition: A | Discharge status: Home / Self Care | Payer: BC Managed Care – PPO | Attending: Emergency Medicine | Admitting: Emergency Medicine

## 2013-10-31 ENCOUNTER — Telehealth: Payer: Self-pay | Admitting: Pulmonary Disease

## 2013-10-31 ENCOUNTER — Encounter (HOSPITAL_COMMUNITY): Payer: Self-pay | Admitting: Emergency Medicine

## 2013-10-31 DIAGNOSIS — R197 Diarrhea, unspecified: Secondary | ICD-10-CM

## 2013-10-31 DIAGNOSIS — Z8709 Personal history of other diseases of the respiratory system: Secondary | ICD-10-CM | POA: Insufficient documentation

## 2013-10-31 DIAGNOSIS — Z8719 Personal history of other diseases of the digestive system: Secondary | ICD-10-CM | POA: Insufficient documentation

## 2013-10-31 DIAGNOSIS — Z862 Personal history of diseases of the blood and blood-forming organs and certain disorders involving the immune mechanism: Secondary | ICD-10-CM | POA: Insufficient documentation

## 2013-10-31 DIAGNOSIS — R109 Unspecified abdominal pain: Secondary | ICD-10-CM | POA: Insufficient documentation

## 2013-10-31 DIAGNOSIS — Z880 Allergy status to penicillin: Secondary | ICD-10-CM | POA: Insufficient documentation

## 2013-10-31 DIAGNOSIS — Z8619 Personal history of other infectious and parasitic diseases: Secondary | ICD-10-CM | POA: Insufficient documentation

## 2013-10-31 DIAGNOSIS — Z79899 Other long term (current) drug therapy: Secondary | ICD-10-CM | POA: Insufficient documentation

## 2013-10-31 DIAGNOSIS — Z8639 Personal history of other endocrine, nutritional and metabolic disease: Secondary | ICD-10-CM | POA: Insufficient documentation

## 2013-10-31 DIAGNOSIS — R631 Polydipsia: Secondary | ICD-10-CM | POA: Insufficient documentation

## 2013-10-31 DIAGNOSIS — Z7982 Long term (current) use of aspirin: Secondary | ICD-10-CM | POA: Insufficient documentation

## 2013-10-31 DIAGNOSIS — I499 Cardiac arrhythmia, unspecified: Secondary | ICD-10-CM | POA: Insufficient documentation

## 2013-10-31 DIAGNOSIS — Z872 Personal history of diseases of the skin and subcutaneous tissue: Secondary | ICD-10-CM | POA: Insufficient documentation

## 2013-10-31 DIAGNOSIS — Z87891 Personal history of nicotine dependence: Secondary | ICD-10-CM | POA: Insufficient documentation

## 2013-10-31 DIAGNOSIS — M069 Rheumatoid arthritis, unspecified: Secondary | ICD-10-CM | POA: Insufficient documentation

## 2013-10-31 DIAGNOSIS — I4891 Unspecified atrial fibrillation: Secondary | ICD-10-CM | POA: Insufficient documentation

## 2013-10-31 DIAGNOSIS — R6883 Chills (without fever): Secondary | ICD-10-CM | POA: Insufficient documentation

## 2013-10-31 LAB — CBC WITH DIFFERENTIAL/PLATELET
BASOS PCT: 0 % (ref 0–1)
Basophils Absolute: 0 10*3/uL (ref 0.0–0.1)
EOS ABS: 0.1 10*3/uL (ref 0.0–0.7)
EOS PCT: 1 % (ref 0–5)
HEMATOCRIT: 43.1 % (ref 39.0–52.0)
HEMOGLOBIN: 15.6 g/dL (ref 13.0–17.0)
Lymphocytes Relative: 8 % — ABNORMAL LOW (ref 12–46)
Lymphs Abs: 0.9 10*3/uL (ref 0.7–4.0)
MCH: 30.9 pg (ref 26.0–34.0)
MCHC: 36.2 g/dL — AB (ref 30.0–36.0)
MCV: 85.3 fL (ref 78.0–100.0)
MONO ABS: 0.8 10*3/uL (ref 0.1–1.0)
MONOS PCT: 7 % (ref 3–12)
Neutro Abs: 9.6 10*3/uL — ABNORMAL HIGH (ref 1.7–7.7)
Neutrophils Relative %: 84 % — ABNORMAL HIGH (ref 43–77)
Platelets: 193 10*3/uL (ref 150–400)
RBC: 5.05 MIL/uL (ref 4.22–5.81)
RDW: 15.4 % (ref 11.5–15.5)
WBC: 11.3 10*3/uL — ABNORMAL HIGH (ref 4.0–10.5)

## 2013-10-31 LAB — COMPREHENSIVE METABOLIC PANEL
ALK PHOS: 57 U/L (ref 39–117)
ALT: 28 U/L (ref 0–53)
AST: 31 U/L (ref 0–37)
Albumin: 3.9 g/dL (ref 3.5–5.2)
BUN: 16 mg/dL (ref 6–23)
CALCIUM: 8.8 mg/dL (ref 8.4–10.5)
CO2: 20 meq/L (ref 19–32)
Chloride: 102 mEq/L (ref 96–112)
Creatinine, Ser: 1.03 mg/dL (ref 0.50–1.35)
GFR calc Af Amer: 88 mL/min — ABNORMAL LOW (ref >90)
GFR calc non Af Amer: 76 mL/min — ABNORMAL LOW (ref >90)
Glucose, Bld: 125 mg/dL — ABNORMAL HIGH (ref 70–99)
POTASSIUM: 3.8 meq/L (ref 3.7–5.3)
SODIUM: 138 meq/L (ref 137–147)
TOTAL PROTEIN: 6.9 g/dL (ref 6.0–8.3)
Total Bilirubin: 0.7 mg/dL (ref 0.3–1.2)

## 2013-10-31 LAB — I-STAT CG4 LACTIC ACID, ED: Lactic Acid, Venous: 1.44 mmol/L (ref 0.5–2.2)

## 2013-10-31 MED ORDER — CIPROFLOXACIN HCL 500 MG PO TABS: 500.0000 mg | ORAL_TABLET | Freq: Two times a day (BID) | ORAL | Status: DC

## 2013-10-31 MED ORDER — SODIUM CHLORIDE 0.9 % IV BOLUS (SEPSIS)
1000.0000 mL | Freq: Once | INTRAVENOUS | Status: AC
  Administered 2013-10-31: 1000 mL via INTRAVENOUS

## 2013-10-31 MED ORDER — LOPERAMIDE HCL 2 MG PO CAPS
2.0000 mg | ORAL_CAPSULE | Freq: Once | ORAL | Status: AC
  Administered 2013-10-31: 2 mg via ORAL
  Filled 2013-10-31: qty 1

## 2013-10-31 MED ORDER — METRONIDAZOLE 500 MG PO TABS: 500.0000 mg | ORAL_TABLET | Freq: Two times a day (BID) | ORAL | Status: DC

## 2013-10-31 NOTE — ED Provider Notes (Signed)
CSN: [id number]     Arrival date & time 10/31/13  2115 History   This chart was scribed for Sharyon Cable, MD by Martinique Peace, ED Scribe. The patient was seen in [place]. The patient's care was started at 10:01 PM.    Chief Complaint  Patient presents with  . Diarrhea      Patient is a 63 y.o. male presenting with diarrhea. The history is provided by the patient. No language interpreter was used.  Diarrhea Associated symptoms: abdominal pain and chills   Associated symptoms: no fever and no vomiting   HPI Comments: William West is a 63 y.o. male who presents to the Emergency Department complaining of acute diarrhea since Monday afternoon and states that he has lost 5-6 lbs in this time. He reports associated polydipsia, chills and an episode of arrhythmia this morning that lasted about 2-3 hours (he has h/o paroxysmal afib).  He also states that he has had no appetite change but everything that he consumes goes right through him. His wife reports him being lethargic since Monday as well. Pt says that he is experiencing bowel movements every 1 or 2 hrs daily and each episode is always diarrhea. His wife goes on to state that he did travel this [place] to the [place] with her and some of his other relatives. He reports drinking only tap water, denies taking any antibiotics recently, any fever or vomiting. [name] He states he has history of food poisoning but states it isn't similar to this current experience. Dr. Leeanne Deed is his PCP but pt states that he is out of town.   Past Medical History  Diagnosis Date  . Allergic rhinitis   . Dermatitis   . Tobacco use disorder   . Hypercholesterolemia   . Diverticulosis of colon   . Mononucleosis   . Acute arthritis   . Dysplastic nevus   . A-fib   . RA (rheumatoid arthritis)    Past Surgical History  Procedure Laterality Date  . Tonsillectomy and adenoidectomy  as a child  . L foot injury  age 32    w/ part amputation great toe  .  Dysplastic nevus  07/1991    excised from upper back  . Cholecystectomy      laparoscopic   Family History  Problem Relation Age of Onset  . Prostate cancer Father   . Other Father     pacemaker  . Heart disease Mother   . Other Mother     vascular disease   History  Substance Use Topics  . Smoking status: Former Smoker -- 0.50 packs/day for 45 years    Types: Cigarettes    Quit date: 11/13/2009  . Smokeless tobacco: Not on file  . Alcohol Use: Yes     Comment: social    Review of Systems  Constitutional: Positive for chills and activity change (less active, lethargic as wife describes). Negative for fever and appetite change.  Cardiovascular:       Arrythmia.    Gastrointestinal: Positive for abdominal pain and diarrhea. Negative for vomiting and blood in stool.  Endocrine: Positive for polydipsia.  All other systems reviewed and are negative.     Allergies  Caffeine; Penicillins; and Pseudoephedrine-ibuprofen  Home Medications   Prior to Admission medications   Medication Sig Start Date End Date Taking? Authorizing Provider  Ascorbic Acid (VITAMIN C) 500 MG tablet Take 500 mg by mouth every morning. once daily with rose hips   Yes Historical Provider,  MD  aspirin EC 81 MG tablet Take 81 mg by mouth every morning.   Yes Historical Provider, MD  diltiazem (DILACOR XR) 120 MG 24 hr capsule Take 120 mg by mouth every morning.   Yes Historical Provider, MD  etanercept (ENBREL) 50 MG/ML injection Inject 50 mg into the skin once a week. On mondays   Yes Historical Provider, MD  flecainide (TAMBOCOR) 100 MG tablet Take 100 mg by mouth daily as needed (for heart rhythm).   Yes Historical Provider, MD  folic acid (FOLVITE) 308 MCG tablet Take 800 mcg by mouth daily.     Yes Historical Provider, MD  LORazepam (ATIVAN) 1 MG tablet Take 1 mg by mouth daily as needed for anxiety.   Yes Historical Provider, MD  methotrexate (RHEUMATREX) 2.5 MG tablet Take 2.5 mg by mouth once a  week. 4 tablets by mouth every Monday   Yes Historical Provider, MD  omeprazole (PRILOSEC) 20 MG capsule Take 20 mg by mouth daily.    Yes Historical Provider, MD  ibuprofen (ADVIL,MOTRIN) 200 MG tablet Take 800 mg by mouth. As needed for pain    Historical Provider, MD   Triage Vitals: BP 119/82  Pulse 91  Temp(Src) 97.7 F (36.5 C) (Oral)  Resp 20  Ht 6' (1.829 m)  Wt 223 lb 4.8 oz (101.288 kg)  BMI 30.28 kg/m2  SpO2 98% Physical Exam CONSTITUTIONAL: Well developed/well nourished HEAD: Normocephalic/atraumatic EYES: EOMI/PERRL, no icterus ENMT: Mucous membranes moist NECK: supple no meningeal signs SPINE:entire spine nontender CV: S1/S2 noted, no murmurs/rubs/gallops noted LUNGS: Lungs are clear to auscultation bilaterally, no apparent distress ABDOMEN: soft, nontender, no rebound or guarding GU:no cva tenderness NEURO: Pt is awake/alert, moves all extremitiesx4 EXTREMITIES: pulses normal, full ROM SKIN: warm, color normal PSYCH: no abnormalities of mood noted   ED Course  Procedures  DIAGNOSTIC STUDIES: Oxygen Saturation is 98% on room air, normal by my interpretation.    COORDINATION OF CARE: 10:09 PM- Treatment plan was discussed with patient who verbalizes understanding and agrees.   11:40 PM Pt improved He is in no distress Denies abd pain He is immunosuppressed due to h/o RA and his meds (methotrexate/enbrel) and has had no sick contacts.  Will treat with cipro/flagyll empirically while cultures are pending.  Pt feels comfortable with this plan as he is unable to see PCP until next week We discussed strict return precautions   Labs Review Labs Reviewed  COMPREHENSIVE METABOLIC PANEL - Abnormal; Notable for the following:    Glucose, Bld 125 (*)    GFR calc non Af Amer 76 (*)    GFR calc Af Amer 88 (*)    All other components within normal limits  CBC WITH DIFFERENTIAL - Abnormal; Notable for the following:    WBC 11.3 (*)    MCHC 36.2 (*)    Neutrophils  Relative % 84 (*)    Neutro Abs 9.6 (*)    Lymphocytes Relative 8 (*)    All other components within normal limits  CLOSTRIDIUM DIFFICILE BY PCR  STOOL CULTURE  I-STAT CG4 LACTIC ACID, ED     Medications  sodium chloride 0.9 % bolus 1,000 mL (1,000 mLs Intravenous New Bag/Given 10/31/13 2240)    MDM   Final diagnoses:  Diarrhea    Nursing notes including past medical history and social history reviewed and considered in documentation Labs/vital reviewed and considered   I personally performed the services described in this documentation, which was scribed in my presence. The recorded  information has been reviewed and is accurate.      Sharyon Cable, MD 10/31/13 [phone]

## 2013-10-31 NOTE — Telephone Encounter (Signed)
Nothing to offer over the phone and needs to go to UC or ER as may need IV fluids

## 2013-10-31 NOTE — Telephone Encounter (Signed)
Called and spoke with pts wife and she stated that the pt has been having diarrhea that started on Monday.  Pt has not taken anything otc to help with this.  She stated that the pt is on enbrel for RA and pt stated that he does not have any other symptoms other than fatigue and she stated that he cannot stand for long periods of time due to being  Weak feeling.  Pt has kept down some water and gatorade and yogurt.  pts wife is aware that SN is out of the office until next week.  Will forward to MW for further recs.  Please advise. Thanks  Allergies  Allergen Reactions  . Caffeine   . Penicillins     REACTION: rash  . Pseudoephedrine-Ibuprofen      Current Outpatient Prescriptions on File Prior to Visit  Medication Sig Dispense Refill  . Ascorbic Acid (VITAMIN C) 500 MG tablet Take 500 mg by mouth daily. Take 2 tablets by mouth once daily      . aspirin 81 MG tablet Take 81 mg by mouth daily.        [name] diltiazem (CARDIZEM CD) 120 MG 24 hr capsule TAKE 1 CAPSULE (120 MG TOTAL) BY MOUTH DAILY.  90 capsule  3  . etanercept (ENBREL) 50 MG/ML injection Inject 50 mg into the skin once a week. On mondays      . flecainide (TAMBOCOR) 100 MG tablet Take as directed  60 tablet  1  . folic acid (FOLVITE) 505 MCG tablet Take 800 mcg by mouth daily.        [name] ibuprofen (ADVIL,MOTRIN) 200 MG tablet Take 800 mg by mouth. As needed for pain      . LORazepam (ATIVAN) 1 MG tablet Take 1/2 to 1 tablet by mouth three times daily as needed  90 tablet  5  . methotrexate (RHEUMATREX) 2.5 MG tablet Take 2.5 mg by mouth once a week. 4 tablets by mouth every Monday      . Multiple Vitamin (MULTIVITAMIN) tablet Take 1 tablet by mouth daily.      [name] omeprazole (PRILOSEC) 20 MG capsule Take one 30-60 minutes before first meal of the day        No current facility-administered medications on file prior to visit.

## 2013-10-31 NOTE — ED Notes (Signed)
Pt c/o severe diarrhea since Monday, states that he has lost 6 lbs since, pt's wife states pt is lethargic at times, pt thinks he may have had some Afib earlier today

## 2013-10-31 NOTE — Telephone Encounter (Signed)
Spoke with patient-he is aware that MW suggest he go to [facility] [address] or ER as he may need IV fluids. Nothing more needed at this time.

## 2013-11-01 LAB — CLOSTRIDIUM DIFFICILE BY PCR: Toxigenic C. Difficile by PCR: NEGATIVE

## 2013-11-04 LAB — STOOL CULTURE

## 2014-01-14 ENCOUNTER — Other Ambulatory Visit (INDEPENDENT_AMBULATORY_CARE_PROVIDER_SITE_OTHER): Payer: BC Managed Care – PPO

## 2014-01-14 ENCOUNTER — Ambulatory Visit (INDEPENDENT_AMBULATORY_CARE_PROVIDER_SITE_OTHER): Payer: BC Managed Care – PPO | Admitting: Pulmonary Disease

## 2014-01-14 ENCOUNTER — Encounter: Payer: Self-pay | Admitting: Pulmonary Disease

## 2014-01-14 ENCOUNTER — Ambulatory Visit (INDEPENDENT_AMBULATORY_CARE_PROVIDER_SITE_OTHER)
Admission: RE | Admit: 2014-01-14 | Discharge: 2014-01-14 | Disposition: A | Discharge status: Home / Self Care | Payer: BC Managed Care – PPO | Source: Ambulatory Visit | Attending: Pulmonary Disease | Admitting: Pulmonary Disease

## 2014-01-14 VITALS — BP 128/74 | HR 64 | Temp 97.8°F | Ht 72.0 in | Wt 230.2 lb

## 2014-01-14 DIAGNOSIS — K3189 Other diseases of stomach and duodenum: Secondary | ICD-10-CM

## 2014-01-14 DIAGNOSIS — Z0000 Encounter for general adult medical examination without abnormal findings: Secondary | ICD-10-CM

## 2014-01-14 DIAGNOSIS — K5730 Diverticulosis of large intestine without perforation or abscess without bleeding: Secondary | ICD-10-CM

## 2014-01-14 DIAGNOSIS — I480 Paroxysmal atrial fibrillation: Secondary | ICD-10-CM

## 2014-01-14 DIAGNOSIS — I4891 Unspecified atrial fibrillation: Secondary | ICD-10-CM

## 2014-01-14 DIAGNOSIS — E785 Hyperlipidemia, unspecified: Secondary | ICD-10-CM

## 2014-01-14 DIAGNOSIS — M069 Rheumatoid arthritis, unspecified: Secondary | ICD-10-CM

## 2014-01-14 DIAGNOSIS — R1013 Epigastric pain: Secondary | ICD-10-CM

## 2014-01-14 DIAGNOSIS — D239 Other benign neoplasm of skin, unspecified: Secondary | ICD-10-CM

## 2014-01-14 LAB — LIPID PANEL
CHOL/HDL RATIO: 4
Cholesterol: 161 mg/dL (ref 0–200)
HDL: 37.2 mg/dL — AB (ref >39.00)
LDL Cholesterol: 100 mg/dL — ABNORMAL HIGH (ref 0–99)
NonHDL: 123.8
TRIGLYCERIDES: 117 mg/dL (ref 0.0–149.0)
VLDL: 23.4 mg/dL (ref 0.0–40.0)

## 2014-01-14 LAB — TSH: TSH: 1.86 u[IU]/mL (ref 0.35–4.50)

## 2014-01-14 LAB — PSA: PSA: 0.87 ng/mL (ref 0.10–4.00)

## 2014-01-14 MED ORDER — LORAZEPAM 1 MG PO TABS: 1.0000 mg | ORAL_TABLET | Freq: Every day | ORAL | Status: DC | PRN

## 2014-01-14 MED ORDER — AZITHROMYCIN 250 MG PO TABS: ORAL_TABLET | ORAL | Status: DC

## 2014-01-14 NOTE — Patient Instructions (Addendum)
Today we updated your med list in our EPIC system...    Continue your current medications the same...  Today we checked your follow up CXR & supplemental FASTING blood work...    We will contact you w/ the results when available...   We wrote for a ZPak to use as needed for resp infections...  Call for any questions...  Let's plan a follow up visit in 10yr, sooner if needed for problems.Marland KitchenMarland Kitchen

## 2014-01-14 NOTE — Progress Notes (Signed)
Subjective:    Patient ID: William West, male    DOB: [birth year], 63 y.o.   MRN: [id number]  HPI 63 y/o WM, husb of William West, here for a follow up visit & CPX... ~  SEE PREV EPIC NOTES FOR OLDER DATA >>   ~  January 12, 2012:  Yearly ROV & CPX>  [name]'s CC is insomnia (hard to fall asleep & stay asleep) but he tried relatives Lorazepam w/ good result & wonders if this would be ok for him to take... He also c/o intermittent palpitations, racing, & this was worse after recent bee sting; we discussed catecholamine excess 7 avoiding caffeine, nicotine, pseudophed, etc; he used some Benedryl & notes improvement w/ dietary adjustment he says...      Ex-smoker> he quit in 2011 & has remained off cigs; breathing is good he says, no issues; exerc w/ yard work, splits wood, etc...    Chol> on diet alone & wt=235# (unchanged); FLP is fair w/ TChol 176, TG 78, HDL 41, LDL 120; we reviewed diet & wt reduction strategies...    Dyspepsia> on Prilosec 20mg /d & this helps to control his symptoms; he saw [name] 11/12 at [facility] for RUQ pain after lifting- CT was neg & the pain was felt to be musculoskeletal pain; s/p lap chole 7/11 for stones...    Divertics> followed by [facility] for GI; colon 2011 was neg & f/u should be 7-79yrs...    RA> followed by [facility] & recently started on ENBREL & still on the MTX, Advil, tylenol, etc...    Hx dysplastic nevus, fair skinned/ red hair> advised to get Derm eval at least yearly... We reviewed prob list, meds, xrays and labs> see below>>  CXR 8/13 showed normal heart size, clear lungs, low lung vols & sl elev of right hemidiaph, s/p GB...  EKG 8/13 showed NSR, rate62, wnl, NAD...  LABS 8/13:  FLP- ok x LDL=120 on diet alone;  Chems- wnl;  CBC- wnl;  TSH=1.81;  PSA=0.62   ~  January 12, 2013:  Yearly ROV & CPX... William West developed palpit & went to the ER 11/13 w/ AFib, converted w/ CCB; established w/ [name] who treated him w/ Cardizem & Flecainide (prn dosing); he  has had infreq episodes & last saw [name] 3/14- note reviewed... He is also followed by [facility] Salen for RA on Enbrel (50mg  sq weekly) & MTX 2.5mg - 4tabs/wk, Folic 1mg /d; doing very well by all accounts, we discussed diet, exercise, wt reduction... We reviewed the following medical problems during today's office visit >>     Ex-smoker> he quit in 2011 & has remained off cigs; breathing is good he says, no issues; exerc w/ yard work, splits wood, etc...    PAF> on Diltiazem120 & Flecainide prn per [name]; see above...    Chol> on diet alone & wt=234# (unchanged); FLP is improved w/ TChol 166, TG 78, HDL 41, LDL 109; we reviewed diet & wt reduction strategies...    Dyspepsia> on Prilosec 20mg /d & this helps to control his symptoms; he saw [name] 11/12 at [facility] for RUQ pain after lifting- CT was neg & the pain was felt to be musculoskeletal pain; s/p lap chole 7/11 for stones...    Divertics> followed by [facility] for GI; colon 2011 was neg & f/u should be 7-51yrs...    RA> followed by [facility] Sal[facility] on Enbrel & MTX (currently 4/wk)- this has really helped & doing very well on Rx...    Hx dysplastic nevus, fair skinned/ red hair>  advised to get Derm eval at least yearly & he promises to do so... We reviewed prob list, meds, xrays and labs> see below for updates >>   LABS 8/14:  FLP- at goals on diet x LDL=109;  Chems- wnl;  CBC- wnl;  TSH=1.90;  PSA=0.67...  ~  January 14, 2014:  Yearly ROV & CPX> William West reports a good yr overall; he continues to see [name] for RA on Enbrel & MTX every 3-53mo for labs and OVs (we do not have recent notes from them);  He notes sl cough w/ greenish sput in the AMs but no f/c/s, chest discomfort, or SOB; we discussed ZPak Mucinex, Fluids for prn use;  He also had a bout of diarrhea 6/15=> went to ER w/ neg stoll studies, treated empirically w/ Cipro/Flagyl & symptoms resolved...     Ex-smoker> he quit in 2011 & has remained off cigs; breathing is good he says, no issues; exerc w/  yard work, splits wood, etc; CXR is clear...    PAF> on Diltiazem120 & Flecainide prn per [name] "pill in the pocket" regimen (see below); he has used this med on only one occas during the last yr he says...    Chol> on diet alone & wt=230# (unchanged); FLP is stable w/ TChol 161, TG 117, HDL 37, LDL 100; we reviewed diet & wt reduction strategies...    Dyspepsia> on Prilosec 20mg /d & this helps to control his symptoms; he saw [name] 11/12 at [facility] for RUQ pain after lifting- CT was neg & the pain was felt to be musculoskeletal pain; s/p lap chole 7/11 for stones...    Divertics> followed by [facility] for GI; colon 2011 was neg & f/u should be 7-93yrs...    RA> followed by [facility] every 3-63mo on Enbrel & MTX (currently 4/wk)- this has really helped & doing very well on Rx; we do not have notes or labs from Rheum...    Hx dysplastic nevus, fair skinned/ red hair> advised to get Derm eval at least yearly & he promises to do so... We reviewed prob list, meds, xrays and labs> see below for updates >>   CXR 8/15 showed norm heart size, clear lungs, NAD...  LABS 6/15 via ER>  Chems- wnl;  CBC- wnl   LABS 8/15:  FLP- ok on diet alone;  TSH= 1.86;  PSA= 0.87           Problem List:     Ex-CIGARETTE SMOKER (ICD-305.1) - started as teen, up to 1/2 ppd, quit for up to 45yrs, recently 1/2 ppd but he says he quit 6/11 & has remained quit!  No hx resp tract diseases...  ~  baseline CXRs showed clear, NAD.[name]. ~  CXR 7/11 showed essent clear lungs, +gallstone seen in right abd... ~  CXR 8/13 showed normal heart size, clear lungs, low lung vols & sl elev of right hemidiaph, s/p GB...  HYPERCHOLESTEROLEMIA, BORDERLINE (ICD-272.4) - currently on diet Rx alone. ~  FLP 1/09 showed TChol 198, TG 127, HDL 34, LDL 138 ~  FLP 6/10 showed TChol 170, TG 96, HDL 40, LDL 111 ~  FLP 6/11 showed TChol 184, TG 104, HDL 43, LDL 120... rec low chol/ low fat diet. ~  FLP 8/12 (wt=237#) showed TChol 183, TG 97, HDL 43,  LDL 121... Not really on diet "I just eat" ~  FLP 8/13 (wt=235#) showed TChol 176, TG 78, HDL 41, LDL 120 ~  FLP 8/14 on diet alone (wt=234#) showed TChol 166, TG  78, HDL 41, LDL 109  DYSPEPSIA (ICD-536.8) - takes PRILOSEC OTC daily... no dysphagia, N/V, abd pain, etc... no prev endo etc... states he really needs it daily & has pain/ difficulty if he stops the Rx. ~  S/p lap chole 7/11 by [name] for cholecystitis & stones...  DIVERTICULOSIS OF COLON (ICD-562.10)  ~  He had colonoscopy 1/06 from DrMann= divertics, tiny polyp (path=norm mucosa), hems... ~  F/u colon 11/11 by DrNat-Mann w/ divertics, hems, sm rectal polyps= fragments of benign mucosa only...  Hx of MONONUCLEOSIS (ICD-075) - in high school... had mono hepatitis... no known sequellae... he also had RMSF at age 75- pretty sick, recovered w/o sequellae. ~  labs have consistently shown normal LFTs x during his cholecystitis presentation 7/11...  RHEUMATOID ARTHRITIS >> Diagnosed w/ sero-pos RA 10/11 & referred to Rheum, [name]- on MTX currently 6/wk & occas Pred taper... ~  8/13:  He tells me that [place] started ENBREL about 6 weeks ago 7 he is already feeling better (we do not have notes from Rheum). ~  8/14:  followed by [facility] on Enbrel & MTX (currently 4/wk)- this has really helped & doing very well on Rx...  Hx of DYSPLASTIC NEVUS (ICD-216.9) - removed from base of neck/ upper back by DrHensel/ DrCrowe in 1993... ~  8/13:  He is reminded to check w/ Derm at least yearly due to his fair complexion, red hair, sun exposure, etc... ~  8/14:  He has not yet followed up w/ derm & promises to do so soon...  HEALTH MAINTENANCE:  on ASA 81mg  daily... ~  GI:  colonoscopy 1/06 & 11/11 by DrMann as above... ~  GU: neg DRE & PSA here remains wnl... ~  Immunizations:  age 56 now> exsmoker- given PNEUMOVAX 6/10;  given TETANUS shot 6/10; discussed yearly FLU vaccines.   Current Medications, Allergies, Past Medical History,  Past Surgical History, Family History, and Social History were reviewed in [employer] record.   Past Surgical History  Procedure Laterality Date  . Tonsillectomy and adenoidectomy  as a child  . L foot injury  age 74    w/ part amputation great toe  . Dysplastic nevus  07/1991    excised from upper back  . Cholecystectomy      laparoscopic    Outpatient Encounter Prescriptions as of 01/14/2014  Medication Sig  . Ascorbic Acid (VITAMIN C) 500 MG tablet Take 500 mg by mouth every morning. once daily with rose hips  . aspirin EC 81 MG tablet Take 81 mg by mouth every morning.  . ciprofloxacin (CIPRO) 500 MG tablet Take 1 tablet (500 mg total) by mouth 2 (two) times daily. One po bid x 7 days  . diltiazem (DILACOR XR) 120 MG 24 hr capsule Take 120 mg by mouth every morning.  . etanercept (ENBREL) 50 MG/ML injection Inject 50 mg into the skin once a week. On mondays  . flecainide (TAMBOCOR) 100 MG tablet Take 100 mg by mouth daily as needed (for heart rhythm).  . folic acid (FOLVITE) 440 MCG tablet Take 800 mcg by mouth daily.    [name] ibuprofen (ADVIL,MOTRIN) 200 MG tablet Take 800 mg by mouth. As needed for pain  . LORazepam (ATIVAN) 1 MG tablet Take 1 mg by mouth daily as needed for anxiety.  . methotrexate (RHEUMATREX) 2.5 MG tablet Take 2.5 mg by mouth once a week. 4 tablets by mouth every Monday  . metroNIDAZOLE (FLAGYL) 500 MG tablet Take 1 tablet (500  mg total) by mouth 2 (two) times daily. One po bid x 7 days  . omeprazole (PRILOSEC) 20 MG capsule Take 20 mg by mouth daily.     Allergies  Allergen Reactions  . Caffeine   . Penicillins     REACTION: rash  . Pseudoephedrine-Ibuprofen     Review of Systems        See HPI - all other systems neg except as noted...        The patient denies anorexia, fever, weight loss, weight gain, vision loss, decreased hearing, hoarseness, chest pain, syncope, dyspnea on exertion, peripheral edema, prolonged cough,  headaches, hemoptysis, abdominal pain, melena, hematochezia, severe indigestion/heartburn, hematuria, incontinence, muscle weakness, suspicious skin lesions, transient blindness, difficulty walking, depression, unusual weight change, abnormal bleeding, enlarged lymph nodes, and angioedema.   MS:  Complains of joint pain, joint swelling, loss of strength, and stiffness (all improved on MTX); denies joint redness, low back pain, mid back pain, muscle aches, cramps, and thoracic pain.   Objective:   Physical Exam     WD, Overweight, 63 y/o WM in NAD... GENERAL:  Alert & oriented; pleasant & cooperative... HEENT:  [place]/AT, EOM-wnl, PERRLA, Fundi-benign, EACs-clear, TMs-wnl, NOSE-clear, THROAT-clear & wnl. NECK:  Supple w/ full ROM; no JVD; normal carotid impulses w/o bruits; no thyromegaly or nodules palpated; no lymphadenopathy. CHEST:  Clear to P & A; without wheezes/ rales/ or rhonchi. HEART:  Regular Rhythm; without murmurs/ rubs/ or gallops. ABDOMEN:  Soft & nontender; s/p lap chole, normal bowel sounds; no organomegaly or masses detected. (RECTAL:  Neg - prostate 2+ & nontender w/o nodules; stool hematest neg) EXT: deformity L great toe, sl swelling & tender MCPs, some IPs, & wrist; no varicose veins/ venous insuffic/ or edema. NEURO:  CN's intact; motor testing normal; sensory testing normal; gait normal & balance OK. DERM:  scar in upper back at base of neck toward R side, no new lesions noted...  RADIOLOGY DATA:  Reviewed in the EPIC EMR & discussed w/ the patient...  LABORATORY DATA:  Reviewed in the EPIC EMR & discussed w/ the patient...   Assessment & Plan:    CPX>  He quit smoking 6/11;  Baseline EKG w/ NSR, NSSTTWA, NAD;  Baseline CXR = NAD; Labs look good as well...  PAF>  New prob 11/13- followed by [name] on Cardizem & Flecainide prn...  CHOL>  As noted w/ LDL down to 109 - needs better diet, wt reduction or consider low dose statin rx, he will decide...  Dyspepsia>  On  Prilosec daily...  Divertics/ sm polyps>  follwed by DrNat-Mann & up to date on colon screening etc...  Sero-positive RA>  Followed by [facility] Salen on ENBREL & MTX/ Folic...  Other medical problems as noted...   Patient's Medications  New Prescriptions   AZITHROMYCIN (ZITHROMAX) 250 MG TABLET    Take as directed  Previous Medications   ASCORBIC ACID (VITAMIN C) 500 MG TABLET    Take 500 mg by mouth every morning. once daily with rose hips   ASPIRIN EC 81 MG TABLET    Take 81 mg by mouth every morning.   DILTIAZEM (DILACOR XR) 120 MG 24 HR CAPSULE    Take 120 mg by mouth every morning.   ETANERCEPT (ENBREL) 50 MG/ML INJECTION    Inject 50 mg into the skin once a week. On mondays   FLECAINIDE (TAMBOCOR) 100 MG TABLET    Take 100 mg by mouth daily as needed (for heart rhythm).   FOLIC  ACID (FOLVITE) 800 MCG TABLET    Take 800 mcg by mouth daily.     IBUPROFEN (ADVIL,MOTRIN) 200 MG TABLET    Take 800 mg by mouth. As needed for pain   METHOTREXATE (RHEUMATREX) 2.5 MG TABLET    Take 2.5 mg by mouth once a week. 4 tablets by mouth every Monday   OMEPRAZOLE (PRILOSEC) 20 MG CAPSULE    Take 20 mg by mouth daily.   Modified Medications   Modified Medication Previous Medication   LORAZEPAM (ATIVAN) 1 MG TABLET LORazepam (ATIVAN) 1 MG tablet      Take 1 tablet (1 mg total) by mouth daily as needed for anxiety.    Take 1 mg by mouth daily as needed for anxiety.  Discontinued Medications   CIPROFLOXACIN (CIPRO) 500 MG TABLET    Take 1 tablet (500 mg total) by mouth 2 (two) times daily. One po bid x 7 days   METRONIDAZOLE (FLAGYL) 500 MG TABLET    Take 1 tablet (500 mg total) by mouth 2 (two) times daily. One po bid x 7 days

## 2014-03-04 ENCOUNTER — Telehealth: Payer: Self-pay | Admitting: Pulmonary Disease

## 2014-03-04 NOTE — Telephone Encounter (Signed)
Called and spoke with pt and he is aware of appt with SN on Tuesday at 2:30.  Pt c/o some abd discomfort since last Thursday. Pt stated that he is not really having painful urination but there is some pressure there and when he coughs he can feel  That pressure as well.

## 2014-03-05 ENCOUNTER — Encounter: Payer: Self-pay | Admitting: Pulmonary Disease

## 2014-03-05 ENCOUNTER — Ambulatory Visit (INDEPENDENT_AMBULATORY_CARE_PROVIDER_SITE_OTHER): Payer: BC Managed Care – PPO | Admitting: Pulmonary Disease

## 2014-03-05 ENCOUNTER — Other Ambulatory Visit (INDEPENDENT_AMBULATORY_CARE_PROVIDER_SITE_OTHER): Payer: BC Managed Care – PPO

## 2014-03-05 VITALS — BP 118/88 | HR 71 | Temp 98.2°F | Ht 72.0 in | Wt 228.8 lb

## 2014-03-05 DIAGNOSIS — M069 Rheumatoid arthritis, unspecified: Secondary | ICD-10-CM

## 2014-03-05 DIAGNOSIS — R399 Unspecified symptoms and signs involving the genitourinary system: Secondary | ICD-10-CM

## 2014-03-05 DIAGNOSIS — K5730 Diverticulosis of large intestine without perforation or abscess without bleeding: Secondary | ICD-10-CM

## 2014-03-05 DIAGNOSIS — K219 Gastro-esophageal reflux disease without esophagitis: Secondary | ICD-10-CM

## 2014-03-05 DIAGNOSIS — I480 Paroxysmal atrial fibrillation: Secondary | ICD-10-CM

## 2014-03-05 LAB — URINALYSIS
Bilirubin Urine: NEGATIVE
HGB URINE DIPSTICK: NEGATIVE
KETONES UR: NEGATIVE
Leukocytes, UA: NEGATIVE
Nitrite: NEGATIVE
Specific Gravity, Urine: >=1.03 — AB (ref 1.000–1.030)
Total Protein, Urine: NEGATIVE
URINE GLUCOSE: NEGATIVE
UROBILINOGEN UA: 0.2 (ref 0.0–1.0)
pH: 5.5 (ref 5.0–8.0)

## 2014-03-05 LAB — BASIC METABOLIC PANEL
BUN: 14 mg/dL (ref 6–23)
CALCIUM: 9.5 mg/dL (ref 8.4–10.5)
CO2: 24 meq/L (ref 19–32)
Chloride: 108 mEq/L (ref 96–112)
Creatinine, Ser: 1.1 mg/dL (ref 0.4–1.5)
GFR: 69.74 mL/min (ref >60.00)
GLUCOSE: 91 mg/dL (ref 70–99)
Potassium: 4.2 mEq/L (ref 3.5–5.1)
SODIUM: 141 meq/L (ref 135–145)

## 2014-03-05 LAB — CBC WITH DIFFERENTIAL/PLATELET
BASOS ABS: 0 10*3/uL (ref 0.0–0.1)
Basophils Relative: 0.4 % (ref 0.0–3.0)
Eosinophils Absolute: 0.1 10*3/uL (ref 0.0–0.7)
Eosinophils Relative: 1.5 % (ref 0.0–5.0)
HCT: 41.5 % (ref 39.0–52.0)
Hemoglobin: 13.9 g/dL (ref 13.0–17.0)
LYMPHS ABS: 1.7 10*3/uL (ref 0.7–4.0)
LYMPHS PCT: 21.5 % (ref 12.0–46.0)
MCHC: 33.6 g/dL (ref 30.0–36.0)
MCV: 88.4 fl (ref 78.0–100.0)
Monocytes Absolute: 0.8 10*3/uL (ref 0.1–1.0)
Monocytes Relative: 10.7 % (ref 3.0–12.0)
NEUTROS ABS: 5.1 10*3/uL (ref 1.4–7.7)
Neutrophils Relative %: 65.9 % (ref 43.0–77.0)
Platelets: 238 10*3/uL (ref 150.0–400.0)
RBC: 4.7 Mil/uL (ref 4.22–5.81)
RDW: 15.1 % (ref 11.5–15.5)
WBC: 7.7 10*3/uL (ref 4.0–10.5)

## 2014-03-05 LAB — SEDIMENTATION RATE: Sed Rate: 17 mm/hr (ref 0–22)

## 2014-03-05 NOTE — Patient Instructions (Signed)
Today we updated your med list in our EPIC system...    Continue your current medications the same...  We decided to check some blood work & a urinalysis to look for signs of infection... In the meanwhile try cranberry juice 1-2 glasses per day 7 lots of water... We will contact you w/ the results when available...   Call for any questions.Marland KitchenMarland Kitchen

## 2014-03-05 NOTE — Progress Notes (Signed)
Subjective:    Patient ID: William West, male    DOB: [birth year], 63 y.o.   MRN: [id number]  HPI 63 y/o WM, husb of William West, here for a follow up visit & CPX... ~  SEE PREV EPIC NOTES FOR OLDER DATA >>   ~  January 12, 2012:  Yearly ROV & CPX>  William West's CC is insomnia (hard to fall asleep & stay asleep) but he tried relatives William West w/ good result & wonders if this would be ok for him to take... He also c/o intermittent palpitations, racing, & this was worse after recent bee sting; we discussed catecholamine excess 7 avoiding caffeine, nicotine, pseudophed, etc; he used some Benedryl & notes improvement w/ dietary adjustment he says...      Ex-smoker> he quit in 2011 & has remained off cigs; breathing is good he says, no issues; exerc w/ yard work, splits wood, etc...    Chol> on diet alone & wt=235# (unchanged); FLP is fair w/ TChol 176, TG 78, HDL 41, LDL 120; we reviewed diet & wt reduction strategies...    Dyspepsia> on Prilosec 20mg /d & this helps to control his symptoms; he saw William West 11/12 at [facility] for RUQ pain after lifting- CT was neg & the pain was felt to be musculoskeletal pain; s/p lap chole 7/11 for stones...    Divertics> followed by [facility] for GI; colon 2011 was neg & f/u should be 7-79yrs...    RA> followed by William West & recently started on ENBREL & still on the MTX, Advil, tylenol, etc...    Hx dysplastic nevus, fair skinned/ red hair> advised to get Derm eval at least yearly... We reviewed prob list, meds, xrays and labs> see below>>  CXR 8/13 showed normal heart size, clear lungs, low lung vols & sl elev of right hemidiaph, s/p GB...  EKG 8/13 showed NSR, rate62, wnl, NAD...  LABS 8/13:  FLP- ok x LDL=120 on diet alone;  Chems- wnl;  CBC- wnl;  TSH=1.81;  PSA=0.62   ~  January 12, 2013:  Yearly ROV & CPX... William West developed palpit & went to the ER 11/13 w/ AFib, converted w/ CCB; established w/ William West who treated him w/ Cardizem & Flecainide (prn dosing); he  has had infreq episodes & last saw William West 3/14- note reviewed... He is also followed by William West for RA on Enbrel (50mg  sq weekly) & MTX 2.5mg - 4tabs/wk, Folic 1mg /d; doing very well by all accounts, we discussed diet, exercise, wt reduction... We reviewed the following medical problems during today's office visit >>     Ex-smoker> he quit in 2011 & has remained off cigs; breathing is good he says, no issues; exerc w/ yard work, splits wood, etc...    PAF> on Diltiazem120 & Flecainide prn per William West; see above...    Chol> on diet alone & wt=234# (unchanged); FLP is improved w/ TChol 166, TG 78, HDL 41, LDL 109; we reviewed diet & wt reduction strategies...    Dyspepsia> on Prilosec 20mg /d & this helps to control his symptoms; he saw William West 11/12 at [facility] for RUQ pain after lifting- CT was neg & the pain was felt to be musculoskeletal pain; s/p lap chole 7/11 for stones...    Divertics> followed by William West for GI; colon 2011 was neg & f/u should be 7-51yrs...    RA> followed by William West on Enbrel & MTX (currently 4/wk)- this has really helped & doing very well on Rx...    Hx dysplastic nevus, fair skinned/ red hair>  advised to get Derm eval at least yearly & he promises to do so... We reviewed prob list, meds, xrays and labs> see below for updates >>   LABS 8/14:  FLP- at goals on diet x LDL=109;  Chems- wnl;  CBC- wnl;  TSH=1.90;  PSA=0.67...  ~  January 14, 2014:  Yearly ROV & CPX> William West reports a good yr overall; he continues to see William West for RA on Enbrel & MTX every 3-33mo for labs and OVs (we do not have recent notes from them);  He notes sl cough w/ greenish sput in the AMs but no f/c/s, chest discomfort, or SOB; we discussed ZPak Mucinex, Fluids for prn use;  He also had a bout of diarrhea 6/15=> went to ER w/ neg stoll studies, treated empirically w/ Cipro/Flagyl & symptoms resolved...     Ex-smoker> he quit in 2011 & has remained off cigs; breathing is good he says, no issues; exerc w/  yard work, splits wood, etc; CXR is clear...    PAF> on Diltiazem120 & Flecainide prn per William West "pill in the pocket" regimen (see below); he has used this med on only one occas during the last yr he says...    Chol> on diet alone & wt=230# (unchanged); FLP is stable w/ TChol 161, TG 117, HDL 37, LDL 100; we reviewed diet & wt reduction strategies...    Dyspepsia> on Prilosec 20mg /d & this helps to control his symptoms; he saw William West 11/12 at [facility] for RUQ pain after lifting- CT was neg & the pain was felt to be musculoskeletal pain; s/p lap chole 7/11 for stones...    Divertics> followed by [facility] for GI; colon 2011 was neg & f/u should be 7-56yrs...    RA> followed by William West every 3-11mo on Enbrel & MTX (currently 4/wk)- this has really helped & doing very well on Rx; we do not have notes or labs from Rheum...    Hx dysplastic nevus, fair skinned/ red hair> advised to get Derm eval at least yearly & he promises to do so... We reviewed prob list, meds, xrays and labs> see below for updates >>   CXR 8/15 showed norm heart size, clear lungs, NAD...  LABS 6/15 via ER>  Chems- wnl;  CBC- wnl   LABS 8/15:  FLP- ok on diet alone;  TSH= 1.86;  PSA= 0.87   ~  March 05, 2014:  6wk ROV & add-on appt requested for 5d hx pain, tenderness, discomfort in suprapubic area but denies dysuria, hematuria, foul smelling urine, etc;  He notes "pressure" but no burning, no flankl or back pain, etc and he notes urine stream appears ok w/ good force;  Initially he felt chilled, shakey, but didn't take temp- he notes that tylenol helped;  States he had similar episode 1-2 yrs ago & symptoms resolved w/ meds called in for him at that time...  Exam is essentially neg- except min tender over bladder, neg CVAT, prostate is 2+ and normal, stool heme neg... We decided to check UA (clear), and Labs (all wnl- chems, CBC, Sed); and cover empirically w/ Fluids and cranberry juice, ok to continue Tylenol, we are holding  antibiotics until C&S returns... We reviewed prob list, meds, xrays and labs> see below for updates >>   LABS 10/15:  Chems- wnl w/ Cr=1.1;  CBC- wnl w/ Hg=13.9 & WBC=7.7;  Sed=17;  UA- clear w/ C&S pending...          Problem List:     Ex-CIGARETTE  SMOKER (ICD-305.1) - started as teen, up to 1/2 ppd, quit for up to 9yrs, recently 1/2 ppd but he says he quit 6/11 & has remained quit!  No hx resp tract diseases...  ~  baseline CXRs showed clear, NAD.[name]. ~  CXR 7/11 showed essent clear lungs, +gallstone seen in right abd... ~  CXR 8/13 showed normal heart size, clear lungs, low lung vols & sl elev of right hemidiaph, s/p GB...  HYPERCHOLESTEROLEMIA, BORDERLINE (ICD-272.4) - currently on diet Rx alone. ~  FLP 1/09 showed TChol 198, TG 127, HDL 34, LDL 138 ~  FLP 6/10 showed TChol 170, TG 96, HDL 40, LDL 111 ~  FLP 6/11 showed TChol 184, TG 104, HDL 43, LDL 120... rec low chol/ low fat diet. ~  FLP 8/12 (wt=237#) showed TChol 183, TG 97, HDL 43, LDL 121... Not really on diet "I just eat" ~  FLP 8/13 (wt=235#) showed TChol 176, TG 78, HDL 41, LDL 120 ~  FLP 8/14 on diet alone (wt=234#) showed TChol 166, TG 78, HDL 41, LDL 109  DYSPEPSIA (ICD-536.8) - takes PRILOSEC OTC daily... no dysphagia, N/V, abd pain, etc... no prev endo etc... states he really needs it daily & has pain/ difficulty if he stops the Rx. ~  S/p lap chole 7/11 by William West for cholecystitis & stones...  DIVERTICULOSIS OF COLON (ICD-562.10)  ~  He had colonoscopy 1/06 from DrMann= divertics, tiny polyp (path=norm mucosa), hems... ~  F/u colon 11/11 by DrNat-Mann w/ divertics, hems, sm rectal polyps= fragments of benign mucosa only...  Hx of MONONUCLEOSIS (ICD-075) - in high school... had mono hepatitis... no known sequellae... he also had RMSF at age 80- pretty sick, recovered w/o sequellae. ~  labs have consistently shown normal LFTs x during his cholecystitis presentation 7/11...  RHEUMATOID ARTHRITIS >> Diagnosed w/  sero-pos RA 10/11 & referred to Rheum, William West- on MTX currently 6/wk & occas Pred taper... ~  8/13:  He tells me that [place] started ENBREL about 6 weeks ago 7 he is already feeling better (we do not have notes from Rheum). ~  8/14:  followed by William West on Enbrel & MTX (currently 4/wk)- this has really helped & doing very well on Rx...  Hx of DYSPLASTIC NEVUS (ICD-216.9) - removed from base of neck/ upper back by DrHensel/ DrCrowe in 1993... ~  8/13:  He is reminded to check w/ Derm at least yearly due to his fair complexion, red hair, sun exposure, etc... ~  8/14:  He has not yet followed up w/ derm & promises to do so soon...  HEALTH MAINTENANCE:  on ASA 81mg  daily... ~  GI:  colonoscopy 1/06 & 11/11 by DrMann as above... ~  GU: neg DRE & PSA here remains wnl... ~  Immunizations:  age 44 now> exsmoker- given PNEUMOVAX 6/10;  given TETANUS shot 6/10; discussed yearly FLU vaccines.   Current Medications, Allergies, Past Medical History, Past Surgical History, Family History, and Social History were reviewed in [employer] record.   Past Surgical History  Procedure Laterality Date  . Tonsillectomy and adenoidectomy  as a child  . L foot injury  age 79    w/ part amputation great toe  . Dysplastic nevus  07/1991    excised from upper back  . Cholecystectomy      laparoscopic    Outpatient Encounter Prescriptions as of 03/05/2014  Medication Sig  . Ascorbic Acid (VITAMIN C) 500 MG tablet Take 500 mg by mouth every morning.  once daily with rose hips  . aspirin EC 81 MG tablet Take 81 mg by mouth every morning.  . diltiazem (DILACOR XR) 120 MG 24 hr capsule Take 120 mg by mouth every morning.  . etanercept (ENBREL) 50 MG/ML injection Inject 50 mg into the skin once a week. On mondays  . flecainide (TAMBOCOR) 100 MG tablet Take 100 mg by mouth daily as needed (for heart rhythm).  . folic acid (FOLVITE) 147 MCG tablet Take 800 mcg by mouth daily.    [name]  ibuprofen (ADVIL,MOTRIN) 200 MG tablet Take 800 mg by mouth. As needed for pain  . William West (ATIVAN) 1 MG tablet Take 1 tablet (1 mg total) by mouth daily as needed for anxiety.  . methotrexate (RHEUMATREX) 2.5 MG tablet Take 2.5 mg by mouth once a week. 4 tablets by mouth every Monday  . omeprazole (PRILOSEC) 20 MG capsule Take 20 mg by mouth daily.   [name] azithromycin (ZITHROMAX) 250 MG tablet Take as directed    Allergies  Allergen Reactions  . Caffeine   . Penicillins     REACTION: rash  . Pseudoephedrine-Ibuprofen     Review of Systems        See HPI - all other systems neg except as noted...        The patient denies anorexia, fever, weight loss, weight gain, vision loss, decreased hearing, hoarseness, chest pain, syncope, dyspnea on exertion, peripheral edema, prolonged cough, headaches, hemoptysis, abdominal pain, melena, hematochezia, severe indigestion/heartburn, hematuria, incontinence, muscle weakness, suspicious skin lesions, transient blindness, difficulty walking, depression, unusual weight change, abnormal bleeding, enlarged lymph nodes, and angioedema.   MS:  Complains of joint pain, joint swelling, loss of strength, and stiffness (all improved on MTX); denies joint redness, low back pain, mid back pain, muscle aches, cramps, and thoracic pain.   Objective:   Physical Exam     WD, Overweight, 63 y/o WM in NAD... GENERAL:  Alert & oriented; pleasant & cooperative... HEENT:  [place]/AT, EOM-wnl, PERRLA, Fundi-benign, EACs-clear, TMs-wnl, NOSE-clear, THROAT-clear & wnl. NECK:  Supple w/ full ROM; no JVD; normal carotid impulses w/o bruits; no thyromegaly or nodules palpated; no lymphadenopathy. CHEST:  Clear to P & A; without wheezes/ rales/ or rhonchi. HEART:  Regular Rhythm; without murmurs/ rubs/ or gallops. ABDOMEN:  Soft & only min tender over bladder; s/p lap chole, normal bowel sounds; no organomegaly or masses detected. RECTAL:  Neg - prostate 2+ & nontender w/o  nodules; stool hematest neg EXT: deformity L great toe, sl swelling & tender MCPs, some IPs, & wrist; no varicose veins/ venous insuffic/ or edema. NEURO:  CN's intact; motor testing normal; sensory testing normal; gait normal & balance OK. DERM:  scar in upper back at base of neck toward R side, no new lesions noted...  RADIOLOGY DATA:  Reviewed in the EPIC EMR & discussed w/ the patient...  LABORATORY DATA:  Reviewed in the EPIC EMR & discussed w/ the patient...   Assessment & Plan:    Urinary symptoms>> hx is somewhat vague & seems minor; min tender over bladder otherw norm exam; Labs & UA are clear/wnl; Rec Fluids, Cranberry juice, tylenol prn, & observe...  PAF>  New prob 11/13- followed by William West on Cardizem & Flecainide prn...  CHOL>  As noted w/ LDL down to 109 - needs better diet, wt reduction or consider low dose statin rx, he will decide...  Dyspepsia>  On Prilosec daily...  Divertics/ sm polyps>  follwed by DrNat-Mann & up to date  on colon screening etc...  Sero-positive RA>  Followed by William West on ENBREL & MTX/ Folic...  Other medical problems as noted... CPX>  He quit smoking 6/11;  Baseline EKG w/ NSR, NSSTTWA, NAD;  Baseline CXR = NAD; Labs look good as well...    Patient's Medications  New Prescriptions   No medications on file  Previous Medications   ASCORBIC ACID (VITAMIN C) 500 MG TABLET    Take 500 mg by mouth every morning. once daily with rose hips   ASPIRIN EC 81 MG TABLET    Take 81 mg by mouth every morning.   AZITHROMYCIN (ZITHROMAX) 250 MG TABLET    Take as directed   DILTIAZEM (DILACOR XR) 120 MG 24 HR CAPSULE    Take 120 mg by mouth every morning.   ETANERCEPT (ENBREL) 50 MG/ML INJECTION    Inject 50 mg into the skin once a week. On mondays   FLECAINIDE (TAMBOCOR) 100 MG TABLET    Take 100 mg by mouth daily as needed (for heart rhythm).   FOLIC ACID (FOLVITE) 562 MCG TABLET    Take 800 mcg by mouth daily.     IBUPROFEN (ADVIL,MOTRIN) 200 MG  TABLET    Take 800 mg by mouth. As needed for pain   William West (ATIVAN) 1 MG TABLET    Take 1 tablet (1 mg total) by mouth daily as needed for anxiety.   METHOTREXATE (RHEUMATREX) 2.5 MG TABLET    Take 2.5 mg by mouth once a week. 4 tablets by mouth every Monday   OMEPRAZOLE (PRILOSEC) 20 MG CAPSULE    Take 20 mg by mouth daily.   Modified Medications   No medications on file  Discontinued Medications   No medications on file

## 2014-03-06 LAB — URINE CULTURE
Colony Count: NO GROWTH
Organism ID, Bacteria: NO GROWTH

## 2014-04-04 ENCOUNTER — Ambulatory Visit (INDEPENDENT_AMBULATORY_CARE_PROVIDER_SITE_OTHER): Payer: BC Managed Care – PPO | Admitting: Internal Medicine

## 2014-04-04 ENCOUNTER — Encounter: Payer: Self-pay | Admitting: Internal Medicine

## 2014-04-04 DIAGNOSIS — I482 Chronic atrial fibrillation, unspecified: Secondary | ICD-10-CM

## 2014-04-04 MED ORDER — FLECAINIDE ACETATE 100 MG PO TABS: 100.0000 mg | ORAL_TABLET | Freq: Every day | ORAL | Status: DC | PRN

## 2014-04-04 NOTE — Patient Instructions (Signed)
Your physician wants you to follow-up in: 1 year with Dr. Knox Saliva will receive a reminder letter in the mail two months in advance. If you don't receive a letter, please call our office to schedule the follow-up appointment.  Your physician recommends that you continue on your current medications as directed. Please refer to the Current Medication list given to you today.  I have refilled your flecainide  Thank you for choosing [facility]!!

## 2014-04-04 NOTE — Assessment & Plan Note (Signed)
He will continue to remain active, eat a low fat diet, but will hold off on any statin therapy.

## 2014-04-04 NOTE — Assessment & Plan Note (Signed)
He is doing very well with only 5-6 episodes of atrial fib in the last year. He does not have HTN and stroke risk is very low. He will continue his current meds. I will see him back in a year.

## 2014-04-04 NOTE — Progress Notes (Signed)
HPI William West returns today for followup. He is a pleasant 63 yo man with PAF, HTN, and arthritis. In the interim he has done well utilizing a strategy of a pill in the pocket for control of his atrial fibrillation. He denies chest pain or sob. He has had rare palpitations and no hospitalizations for atrial fibrillation. He has not taken his flecainide as his atrial fib resolves within and hour or two with diltiazem.  Allergies  Allergen Reactions  . Caffeine   . Penicillins     REACTION: rash  . Pseudoephedrine-Ibuprofen      Current Outpatient Prescriptions  Medication Sig Dispense Refill  . Ascorbic Acid (VITAMIN C) 500 MG tablet Take 500 mg by mouth every morning. once daily with rose hips    . aspirin EC 81 MG tablet Take 81 mg by mouth every morning.    . diltiazem (DILACOR XR) 120 MG 24 hr capsule Take 120 mg by mouth every morning.    . etanercept (ENBREL) 50 MG/ML injection Inject 50 mg into the skin once a week. On mondays    . folic acid (FOLVITE) 826 MCG tablet Take 800 mcg by mouth daily.      [name] ibuprofen (ADVIL,MOTRIN) 200 MG tablet Take 800 mg by mouth. As needed for pain    . LORazepam (ATIVAN) 1 MG tablet Take 1 tablet (1 mg total) by mouth daily as needed for anxiety. 30 tablet 5  . methotrexate (RHEUMATREX) 2.5 MG tablet Take 2.5 mg by mouth once a week. 4 tablets by mouth every Monday    . omeprazole (PRILOSEC) 20 MG capsule Take 20 mg by mouth daily.     . flecainide (TAMBOCOR) 100 MG tablet Take 100 mg by mouth daily as needed (for heart rhythm).     No current facility-administered medications for this visit.     Past Medical History  Diagnosis Date  . Allergic rhinitis   . Dermatitis   . Tobacco use disorder   . Hypercholesterolemia   . Diverticulosis of colon   . Mononucleosis   . Acute arthritis   . Dysplastic nevus   . A-fib   . RA (rheumatoid arthritis)     ROS:   All systems reviewed and negative except as noted in the HPI.   Past  Surgical History  Procedure Laterality Date  . Tonsillectomy and adenoidectomy  as a child  . L foot injury  age 54    w/ part amputation great toe  . Dysplastic nevus  07/1991    excised from upper back  . Cholecystectomy      laparoscopic     Family History  Problem Relation Age of Onset  . Prostate cancer Father   . Other Father     pacemaker  . Heart disease Mother   . Other Mother     vascular disease     History   Social History  . Marital Status: Married    Spouse Name: Jeani Hawking x 38 yrs    Number of Children: 2  . Years of Education: N/A   Occupational History  . [employer]    Social History Main Topics  . Smoking status: Former Smoker -- 0.50 packs/day for 45 years    Types: Cigarettes    Quit date: 11/13/2009  . Smokeless tobacco: Not on file  . Alcohol Use: Yes     Comment: social  . Drug Use: No  . Sexual Activity: No   Other Topics Concern  .  Not on file   Social History Narrative   One son with HBP   One son with IDDM      May return soon     BP 120/82 mmHg  Pulse 75  Ht 6' (1.829 m)  Wt 226 lb (102.513 kg)  BMI 30.64 kg/m2  Physical Exam:  Well appearing middle aged man, NAD HEENT: Unremarkable Neck:  6 cm JVD, no thyromegally Back:  No CVA tenderness Lungs:  Clear with no wheezes, rales, or rhonchi HEART:  Regular rate rhythm, no murmurs, no rubs, no clicks Abd:  soft, positive bowel sounds, no organomegally, no rebound, no guarding Ext:  2 plus pulses, no edema, no cyanosis, no clubbing Skin:  No rashes no nodules Neuro:  CN II through XII intact, motor grossly intact  EKG - nsr    Assess/Plan:

## 2014-04-08 ENCOUNTER — Other Ambulatory Visit: Payer: Self-pay | Admitting: Internal Medicine

## 2014-04-11 ENCOUNTER — Other Ambulatory Visit: Payer: Self-pay | Admitting: Internal Medicine

## 2014-07-02 ENCOUNTER — Ambulatory Visit (INDEPENDENT_AMBULATORY_CARE_PROVIDER_SITE_OTHER): Payer: BLUE CROSS/BLUE SHIELD | Admitting: Pulmonary Disease

## 2014-07-02 ENCOUNTER — Encounter: Payer: Self-pay | Admitting: Pulmonary Disease

## 2014-07-02 ENCOUNTER — Other Ambulatory Visit (INDEPENDENT_AMBULATORY_CARE_PROVIDER_SITE_OTHER): Payer: BLUE CROSS/BLUE SHIELD

## 2014-07-02 VITALS — BP 116/76 | HR 82 | Temp 98.0°F | Ht 72.0 in | Wt 223.1 lb

## 2014-07-02 DIAGNOSIS — R112 Nausea with vomiting, unspecified: Secondary | ICD-10-CM

## 2014-07-02 DIAGNOSIS — R1013 Epigastric pain: Secondary | ICD-10-CM

## 2014-07-02 DIAGNOSIS — K2100 Gastro-esophageal reflux disease with esophagitis, without bleeding: Secondary | ICD-10-CM

## 2014-07-02 DIAGNOSIS — M069 Rheumatoid arthritis, unspecified: Secondary | ICD-10-CM

## 2014-07-02 LAB — CBC WITH DIFFERENTIAL/PLATELET
BASOS PCT: 0.4 % (ref 0.0–3.0)
Basophils Absolute: 0 10*3/uL (ref 0.0–0.1)
EOS PCT: 0.1 % (ref 0.0–5.0)
Eosinophils Absolute: 0 10*3/uL (ref 0.0–0.7)
HEMATOCRIT: 43.8 % (ref 39.0–52.0)
HEMOGLOBIN: 15.4 g/dL (ref 13.0–17.0)
LYMPHS ABS: 0.6 10*3/uL — AB (ref 0.7–4.0)
LYMPHS PCT: 6.2 % — AB (ref 12.0–46.0)
MCHC: 35.1 g/dL (ref 30.0–36.0)
MCV: 86.1 fl (ref 78.0–100.0)
Monocytes Absolute: 0.7 10*3/uL (ref 0.1–1.0)
Monocytes Relative: 7.2 % (ref 3.0–12.0)
NEUTROS ABS: 8.4 10*3/uL — AB (ref 1.4–7.7)
Neutrophils Relative %: 86.1 % — ABNORMAL HIGH (ref 43.0–77.0)
Platelets: 206 10*3/uL (ref 150.0–400.0)
RBC: 5.08 Mil/uL (ref 4.22–5.81)
RDW: 14.7 % (ref 11.5–15.5)
WBC: 9.8 10*3/uL (ref 4.0–10.5)

## 2014-07-02 LAB — BASIC METABOLIC PANEL
BUN: 14 mg/dL (ref 6–23)
CO2: 26 meq/L (ref 19–32)
Calcium: 9.4 mg/dL (ref 8.4–10.5)
Chloride: 105 mEq/L (ref 96–112)
Creatinine, Ser: 1.08 mg/dL (ref 0.40–1.50)
GFR: 73.4 mL/min (ref >60.00)
GLUCOSE: 101 mg/dL — AB (ref 70–99)
POTASSIUM: 4.7 meq/L (ref 3.5–5.1)
Sodium: 137 mEq/L (ref 135–145)

## 2014-07-02 LAB — HEPATIC FUNCTION PANEL
ALBUMIN: 4.2 g/dL (ref 3.5–5.2)
ALT: 248 U/L — ABNORMAL HIGH (ref 0–53)
AST: 120 U/L — AB (ref 0–37)
Alkaline Phosphatase: 103 U/L (ref 39–117)
BILIRUBIN DIRECT: 1 mg/dL — AB (ref 0.0–0.3)
BILIRUBIN TOTAL: 2.6 mg/dL — AB (ref 0.2–1.2)
Total Protein: 6.4 g/dL (ref 6.0–8.3)

## 2014-07-02 LAB — SEDIMENTATION RATE: Sed Rate: 5 mm/hr (ref 0–22)

## 2014-07-02 LAB — AMYLASE: AMYLASE: 50 U/L (ref 27–131)

## 2014-07-02 LAB — LIPASE: LIPASE: 36 U/L (ref 11.0–59.0)

## 2014-07-02 MED ORDER — ESOMEPRAZOLE MAGNESIUM 40 MG PO CPDR: 40.0000 mg | DELAYED_RELEASE_CAPSULE | Freq: Two times a day (BID) | ORAL | Status: DC

## 2014-07-02 NOTE — Patient Instructions (Signed)
Today we updated your med list in our EPIC system...     We decided to change your Prilosec to Latimer 40mg  tabs- take one tab twice daily, 30 min before breakfast & dinner...  Today we did some follow up blood work... We will arrange for a repeat Abd Ultrasound exam...    We will contact you w/ the results when available...   We will also arrange for a GI appt ASAP to consider an Endoscopy (a look inside your esophagus & stomach for diagnostic purposes & to see if you need a dilatation...  Let's plan a follow up visit in about one month- or call me if your symptoms are resolved to let me know how you are doing.Marland KitchenMarland Kitchen

## 2014-07-02 NOTE — Progress Notes (Addendum)
Subjective:    Patient ID: William West, male    DOB: [birth year], 64 y.o.   MRN: [id number]  HPI 64 y/o WM, husb of William West, here for a follow up visit & CPX... ~  SEE PREV EPIC NOTES FOR OLDER DATA >>   ~  January 12, 2012:  Yearly ROV & CPX>  William West's CC is insomnia (hard to fall asleep & stay asleep) but he tried relatives Lorazepam w/ good result & wonders if this would be ok for him to take... He also c/o intermittent palpitations, racing, & this was worse after recent bee sting; we discussed catecholamine excess 7 avoiding caffeine, nicotine, pseudophed, etc; he used some Benedryl & notes improvement w/ dietary adjustment he says...      Ex-smoker> he quit in 2011 & has remained off cigs; breathing is good he says, no issues; exerc w/ yard work, splits wood, etc...    Chol> on diet alone & wt=235# (unchanged); FLP is fair w/ TChol 176, TG 78, HDL 41, LDL 120; we reviewed diet & wt reduction strategies...    Dyspepsia> on Prilosec 20mg /d & this helps to control his symptoms; he saw [name] 11/12 at [facility] for RUQ pain after lifting- CT was neg & the pain was felt to be musculoskeletal pain; s/p lap chole 7/11 for stones...    Divertics> followed by [facility] for GI; colon 2011 was neg & f/u should be 7-79yrs...    RA> followed by [facility] Sal[facility] & recently started on ENBREL & still on the MTX, Advil, tylenol, etc...    Hx dysplastic nevus, fair skinned/ red hair> advised to get Derm eval at least yearly... We reviewed prob list, meds, xrays and labs> see below>>  CXR 8/13 showed normal heart size, clear lungs, low lung vols & sl elev of right hemidiaph, s/p GB...  EKG 8/13 showed NSR, rate62, wnl, NAD...  LABS 8/13:  FLP- ok x LDL=120 on diet alone;  Chems- wnl;  CBC- wnl;  TSH=1.81;  PSA=0.62   ~  January 12, 2013:  Yearly ROV & CPX... William West developed palpit & went to the ER 11/13 w/ AFib, converted w/ CCB; established w/ [name] who treated him w/ Cardizem & Flecainide (prn dosing); he  has had infreq episodes & last saw [name] 3/14- note reviewed... He is also followed by Gle[facility] Salen for RA on Enbrel (50mg  sq weekly) & MTX 2.5mg - 4tabs/wk, Folic 1mg /d; doing very well by all accounts, we discussed diet, exercise, wt reduction... We reviewed the following medical problems during today's office visit >>     Ex-smoker> he quit in 2011 & has remained off cigs; breathing is good he says, no issues; exerc w/ yard work, splits wood, etc...    PAF> on Diltiazem120 & Flecainide prn per [name]; see above...    Chol> on diet alone & wt=234# (unchanged); FLP is improved w/ TChol 166, TG 78, HDL 41, LDL 109; we reviewed diet & wt reduction strategies...    Dyspepsia> on Prilosec 20mg /d & this helps to control his symptoms; he saw [name] 11/12 at [facility] for RUQ pain after lifting- CT was neg & the pain was felt to be musculoskeletal pain; s/p lap chole 7/11 for stones...    Divertics> followed by [facility] for GI; colon 2011 was neg & f/u should be 7-51yrs...    RA> followed by [facility] Salen on Enbrel & MTX (currently 4/wk)- this has really helped & doing very well on Rx...    Hx dysplastic nevus, fair skinned/ red hair>  advised to get Derm eval at least yearly & he promises to do so... We reviewed prob list, meds, xrays and labs> see below for updates >>   LABS 8/14:  FLP- at goals on diet x LDL=109;  Chems- wnl;  CBC- wnl;  TSH=1.90;  PSA=0.67...  ~  January 14, 2014:  Yearly ROV & CPX> William West reports a good yr overall; he continues to see [name] for RA on Enbrel & MTX every 3-61mo for labs and OVs (we do not have recent notes from them);  He notes sl cough w/ greenish sput in the AMs but no f/c/s, chest discomfort, or SOB; we discussed ZPak Mucinex, Fluids for prn use;  He also had a bout of diarrhea 6/15=> went to ER w/ neg stoll studies, treated empirically w/ Cipro/Flagyl & symptoms resolved...     Ex-smoker> he quit in 2011 & has remained off cigs; breathing is good he says, no issues; exerc w/  yard work, splits wood, etc; CXR is clear...    PAF> on Diltiazem120 & Flecainide prn per [name] "pill in the pocket" regimen (see below); he has used this med on only one occas during the last yr he says...    Chol> on diet alone & wt=230# (unchanged); FLP is stable w/ TChol 161, TG 117, HDL 37, LDL 100; we reviewed diet & wt reduction strategies...    Dyspepsia> on Prilosec 20mg /d & this helps to control his symptoms; he saw [name] 11/12 at [facility] for RUQ pain after lifting- CT was neg & the pain was felt to be musculoskeletal pain; s/p lap chole 7/11 for stones...    Divertics> followed by [facility] for GI; colon 2011 was neg & f/u should be 7-75yrs...    RA> followed by [facility] Salen every 3-55mo on Enbrel & MTX (currently 4/wk)- this has really helped & doing very well on Rx; we do not have notes or labs from Rheum...    Hx dysplastic nevus, fair skinned/ red hair> advised to get Derm eval at least yearly & he promises to do so... We reviewed prob list, meds, xrays and labs> see below for updates >>   CXR 8/15 showed norm heart size, clear lungs, NAD...  LABS 6/15 via ER>  Chems- wnl;  CBC- wnl   LABS 8/15:  FLP- ok on diet alone;  TSH= 1.86;  PSA= 0.87   ~  March 05, 2014:  6wk ROV & add-on appt requested for 5d hx pain, tenderness, discomfort in suprapubic area but denies dysuria, hematuria, foul smelling urine, etc;  He notes "pressure" but no burning, no flankl or back pain, etc and he notes urine stream appears ok w/ good force;  Initially he felt chilled, shakey, but didn't take temp- he notes that tylenol helped;  States he had similar episode 1-2 yrs ago & symptoms resolved w/ meds called in for him at that time...  Exam is essentially neg- except min tender over bladder, neg CVAT, prostate is 2+ and normal, stool heme neg... We decided to check UA (clear), and Labs (all wnl- chems, CBC, Sed); and cover empirically w/ Fluids and cranberry juice, ok to continue Tylenol, we are holding  antibiotics until C&S returns... We reviewed prob list, meds, xrays and labs> see below for updates >>   LABS 10/15:  Chems- wnl w/ Cr=1.1;  CBC- wnl w/ Hg=13.9 & WBC=7.7;  Sed=17;  UA- clear w/ C&S pending...   ~  July 02, 2014:  30mo ROV & add-on appt requested for Abd  Pain> states this is going on for several months and seems progressive- now getting "attacks" 3 times per week; states the discomfort is similar to prev GB attacks (he had GB removed by [name] in 2011); c/o lots of indigestion- notes some nausea & vomits on occas then feels better; says appetite is fair, eats ok w/o swallowing issues, then 30 mon later "it sits on my stomach"; he has noted white stool yest & states his urine has been darker; he has been on OTC Omeprazole20 w/o help he says... NOTE> he had recheck by [name] for abd pain 70yr after GB surg- they did CT Abd and it was NEG (prev cholecystectomy, sm hepatic dome cyst, diverticulosis, NAD).Marland KitchenMarland Kitchen    He has hx PAF & is followed by [name]> last seen 11/15 & said to be doing well on the "pill in the pocket"strategy for control; rare palpit, no CP/ SOB; on ASA81, Diltiazem120mg  Qam and Flecainide 100mg  as needed for AFib (hasn't needed)...    He is followed by [facility] Salen for Rheum Q3-66mo> hx RA on Enbrel & MTX => we do not have notes from him... We reviewed prob list, meds, xrays and labs> see below for updates >>   LABS 2/16:  Chems- wnl x GOT=120 GPT=248 (these were prev wnl);  AlkPhos=103, Amylase=50 (27-131) & Lipase=36 (11-59);  CBC- wnl;  Sed=5...   Abd Ultrasound 2/16 showed absent GB, ducts ok, poor vis of liver & pancreas, elarged spleen, kidneys ok, no other signif abn noted... PLAN>> Hx progressive abd discomfort w/ "attacks" of pain & some n/v; Labs w/ abn GOT/GPT but norm AlkPhos, Amylase/Lipase, and Sed=5; Sonar w/ splenomegaly, poor vis of liver & pancreas; we discussed Rx w/ stronger PPI for now- Nexium40 bid and refer to GI for further evaluation (?CT Abd  and EGD?) and rx...  ADDENDUM>> Pt called 07/22/14 w/ update- now on Enbrel & off MTX, LFTs are normal, appetite is good & RA under control, on Nexium40 before dinner 7 this is helping he says...         Problem List:     Ex-CIGARETTE SMOKER (ICD-305.1) - started as teen, up to 1/2 ppd, quit for up to 3yrs, recently 1/2 ppd but he says he quit 6/11 & has remained quit!  No hx resp tract diseases...  ~  baseline CXRs showed clear, NAD.[name]. ~  CXR 7/11 showed essent clear lungs, +gallstone seen in right abd... ~  CXR 8/13 showed normal heart size, clear lungs, low lung vols & sl elev of right hemidiaph, s/p GB... ~  CXR 8/15 showed norm heart size, clear lungs, NAD.  PAF >> managed by [name] for Cards- first diagnosed 11/13 when he presented to ER w/ palpit; placed on CCB and converted to NSR... ~  He remains stable on Diltiazem 120 Qam and Flecainide "pill in the pocket" strategy (100mg  prn palpit)... ~  EKG 11/15 showed NSR, rate75, NAD...   HYPERCHOLESTEROLEMIA, BORDERLINE (ICD-272.4) - currently on diet Rx alone. ~  FLP 1/09 showed TChol 198, TG 127, HDL 34, LDL 138 ~  FLP 6/10 showed TChol 170, TG 96, HDL 40, LDL 111 ~  FLP 6/11 showed TChol 184, TG 104, HDL 43, LDL 120... rec low chol/ low fat diet. ~  FLP 8/12 (wt=237#) showed TChol 183, TG 97, HDL 43, LDL 121... Not really on diet "I just eat" ~  FLP 8/13 (wt=235#) showed TChol 176, TG 78, HDL 41, LDL 120 ~  FLP 8/14 on diet alone (wt=234#) showed TChol 166,  TG 78, HDL 41, LDL 109 ~  FLP 8/15 on diet alone (wt=230#) showed TChol 161, TG 117, HDL 37, LDL 100  DYSPEPSIA (ICD-536.8) - takes PRILOSEC OTC daily... no dysphagia, N/V, abd pain, etc... no prev endo etc... states he really needs it daily & has pain/ difficulty if he stops the Rx. ~  S/p lap chole 7/11 by [name] for cholecystitis & stones... ~  2012: he had recurrent abd pain & saw [name] who did a CT Abd&Pelvis- NEG, NAD (prev cholecystectomy, sm hepatic dome cyst,  diverticulosis, NAD); pain felt to be musculoskeletal...  DIVERTICULOSIS OF COLON (ICD-562.10)  ~  He had colonoscopy 1/06 from DrMann= divertics, tiny polyp (path=norm mucosa), hems... ~  F/u colon 11/11 by DrNat-Mann w/ divertics, hems, sm rectal polyps= fragments of benign mucosa only...  ABDOMINAL PAIN & ELEVATED HEPATOCELLULAR ENZYMES >>  ~  2/16: he presented w/ several month hx abd discomfort and seems progressive- now getting "attacks" 3 times per week; states the discomfort is similar to prev GB attacks (he had GB removed by [name] in 2011); c/o lots of indigestion- notes some nausea & vomits on occas then feels better; says appetite is fair, eats ok w/o swallowing issues, then 30 mon later "it sits on my stomach"; he has noted white stool yest & states his urine has been darker; he has been on OTC Omeprazole20 w/o help he says... NOTE> he had recheck by [name] for abd pain 22yr after GB surg- they did CT Abd and it was NEG (prev cholecystectomy, sm hepatic dome cyst, diverticulosis, NAD).Marland KitchenMarland Kitchen We placed him on Nexium40Bid, and referred him to GI for further eval & rx... ~  LABS 2/16 revealed> Chems- wnl x GOT=120 GPT=248 (these were prev wnl); AlkPhos=103, Amylase=50 (27-131) & Lipase=36 (11-59); CBC- wnl;  Sed=5...  ~  Abd Ultrasound 2/16 showed absent GB, ducts ok, poor vis of liver & pancreas, elarged spleen, kidneys ok, no other signif abn noted.[name]  Hx of MONONUCLEOSIS (ICD-075) - in high school... had mono hepatitis... no known sequellae... he also had RMSF at age 71- pretty sick, recovered w/o sequellae. ~  labs have consistently shown normal LFTs x during his cholecystitis presentation 7/11...  RHEUMATOID ARTHRITIS >> Diagnosed w/ sero-pos RA 10/11 & referred to Rheum, [name]- on MTX currently 6/wk & occas Pred taper... ~  8/13:  He tells me that William West started ENBREL about 6 weeks ago 7 he is already feeling better (we do not have notes from Rheum). ~  8/14:  followed by  [facility] Salen on Enbrel & MTX (currently 4/wk)- this has really helped & doing very well on Rx... ~  He continues to f/u w/ Rheum every 3-107mo for exam & labs; we do not have notes or labs from [facility] office...  Hx of DYSPLASTIC NEVUS (ICD-216.9) - removed from base of neck/ upper back by DrHensel/ DrCrowe in 1993... ~  8/13:  He is reminded to check w/ Derm at least yearly due to his fair complexion, red hair, sun exposure, etc... ~  8/14:  He has not yet followed up w/ derm & promises to do so soon...  HEALTH MAINTENANCE:  on ASA 81mg  daily... ~  GI:  colonoscopy 1/06 & 11/11 by DrMann as above... ~  GU: neg DRE & PSA here remains wnl... ~  Immunizations:  age 60 now> exsmoker- given PNEUMOVAX 6/10;  given TETANUS shot 6/10; discussed yearly FLU vaccines.   Current Medications, Allergies, Past Medical History, Past Surgical History, Family History, and Social History  were reviewed in [place] record.   Past Surgical History  Procedure Laterality Date  . Tonsillectomy and adenoidectomy  as a child  . L foot injury  age 62    w/ part amputation great toe  . Dysplastic nevus  07/1991    excised from upper back  . Cholecystectomy      laparoscopic    Outpatient Encounter Prescriptions as of 07/02/2014  Medication Sig  . Ascorbic Acid (VITAMIN C) 500 MG tablet Take 500 mg by mouth every morning. once daily with rose hips  . aspirin EC 81 MG tablet Take 81 mg by mouth every morning.  . diltiazem (CARDIZEM CD) 120 MG 24 hr capsule TAKE 1 CAPSULE (120 MG TOTAL) BY MOUTH DAILY.  [name] diltiazem (CARDIZEM CD) 120 MG 24 hr capsule TAKE 1 CAPSULE (120 MG TOTAL) BY MOUTH DAILY.  [name] diltiazem (DILACOR XR) 120 MG 24 hr capsule Take 120 mg by mouth every morning.  . etanercept (ENBREL) 50 MG/ML injection Inject 50 mg into the skin once a week. On mondays  . flecainide (TAMBOCOR) 100 MG tablet Take 1 tablet (100 mg total) by mouth daily as needed (for heart rhythm).  . folic  acid (FOLVITE) 616 MCG tablet Take 800 mcg by mouth daily.    [name] ibuprofen (ADVIL,MOTRIN) 200 MG tablet Take 800 mg by mouth. As needed for pain  . LORazepam (ATIVAN) 1 MG tablet Take 1 tablet (1 mg total) by mouth daily as needed for anxiety.  . methotrexate (RHEUMATREX) 2.5 MG tablet Take 2.5 mg by mouth once a week. 4 tablets by mouth every Monday  . omeprazole (PRILOSEC) 20 MG capsule Take 20 mg by mouth daily.     Allergies  Allergen Reactions  . Caffeine     Causes him to go into afib  . Penicillins     REACTION: rash  . Pseudoephedrine-Ibuprofen     Review of Systems        See HPI - all other systems neg except as noted... Presents 2/16 c/o abd discomfort, intermit n/v, white stoools and dark urine...       The patient denies anorexia, fever, weight loss, weight gain, vision loss, decreased hearing, hoarseness, chest pain, syncope, dyspnea on exertion, peripheral edema, prolonged cough, headaches, hemoptysis, melena, hematochezia, hematuria, incontinence, muscle weakness, suspicious skin lesions, transient blindness, difficulty walking, depression, unusual weight change, abnormal bleeding, enlarged lymph nodes, and angioedema.   Prev c/o omplains of joint pain, joint swelling, loss of strength, and stiffness (all improved on MTX); denies joint redness, low back pain, mid back pain, muscle aches, cramps, and thoracic pain.   Objective:   Physical Exam     WD, Overweight, 64 y/o WM in NAD... GENERAL:  Alert & oriented; pleasant & cooperative... HEENT:  [place]/AT, EOM-wnl, PERRLA, Fundi-benign, EACs-clear, TMs-wnl, NOSE-clear, THROAT-clear & wnl. NECK:  Supple w/ full ROM; no JVD; normal carotid impulses w/o bruits; no thyromegaly or nodules palpated; no lymphadenopathy. CHEST:  Clear to P & A; without wheezes/ rales/ or rhonchi. HEART:  Regular Rhythm; without murmurs/ rubs/ or gallops. ABDOMEN:  Soft & essent non-tender; s/p lap chole, normal bowel sounds; no organomegaly or masses  detected. RECTAL:  Neg - prostate 2+ & nontender w/o nodules; stool hematest neg EXT: deformity L great toe, sl swelling & tender MCPs, some IPs, & wrist; no varicose veins/ venous insuffic/ or edema. NEURO:  CN's intact; motor testing normal; sensory testing normal; gait normal & balance OK. DERM:  scar  in upper back at base of neck toward R side, no new lesions noted...  RADIOLOGY DATA:  Reviewed in the EPIC EMR & discussed w/ the patient...  LABORATORY DATA:  Reviewed in the EPIC EMR & discussed w/ the patient...   Assessment & Plan:    Progressive abd discomfort assoc w/ "attacks" of pain, indigestion, & intermit n/v; Labs w/ elev GOT/GPT; Sonar w/ splenomegaly but poor vis of liver/pancreas; placed on NEXIUM40Bid & referred to GI for further eval &rx...   PAF>  New prob 11/13- followed by [name] on Cardizem & Flecainide prn...  CHOL>  As noted w/ LDL down to 109 - needs better diet, wt reduction or consider low dose statin rx, he will decide...  Dyspepsia>  On Prilosec daily...  Divertics/ sm polyps>  follwed by DrNat-Mann & up to date on colon screening etc...  Urinary symptoms 10/15>> hx is somewhat vague & seems minor; min tender over bladder otherw norm exam; Labs & UA are clear/wnl; Rec Fluids, Cranberry juice, tylenol prn, & observe; symptoms resolved...  Sero-positive RA>  Followed by [facility] Salen on ENBREL & MTX/ Folic...  Other medical problems as noted... CPX>  He quit smoking 6/11;  Baseline EKG w/ NSR, NSSTTWA, NAD;  Baseline CXR = NAD; Labs look good as well...    Patient's Medications  New Prescriptions   ESOMEPRAZOLE (NEXIUM) 40 MG CAPSULE    Take 1 capsule (40 mg total) by mouth 2 (two) times daily before a meal.  Previous Medications   ASCORBIC ACID (VITAMIN C) 500 MG TABLET    Take 500 mg by mouth every morning. once daily with rose hips   ASPIRIN EC 81 MG TABLET    Take 81 mg by mouth every morning.   DILTIAZEM (CARDIZEM CD) 120 MG 24 HR CAPSULE    TAKE 1  CAPSULE (120 MG TOTAL) BY MOUTH DAILY.   ETANERCEPT (ENBREL) 50 MG/ML INJECTION    Inject 50 mg into the skin once a week. On mondays   FOLIC ACID (FOLVITE) 703 MCG TABLET    Take 800 mcg by mouth daily.     IBUPROFEN (ADVIL,MOTRIN) 200 MG TABLET    Take 800 mg by mouth. As needed for pain   LORAZEPAM (ATIVAN) 1 MG TABLET    Take 1 tablet (1 mg total) by mouth daily as needed for anxiety.   METHOTREXATE (RHEUMATREX) 2.5 MG TABLET    Take 2.5 mg by mouth once a week. 4 tablets by mouth every Monday  Modified Medications   No medications on file  Discontinued Medications   DILTIAZEM (CARDIZEM CD) 120 MG 24 HR CAPSULE    TAKE 1 CAPSULE (120 MG TOTAL) BY MOUTH DAILY.   DILTIAZEM (DILACOR XR) 120 MG 24 HR CAPSULE    Take 120 mg by mouth every morning.   FLECAINIDE (TAMBOCOR) 100 MG TABLET    Take 1 tablet (100 mg total) by mouth daily as needed (for heart rhythm).   OMEPRAZOLE (PRILOSEC) 20 MG CAPSULE    Take 20 mg by mouth daily.

## 2014-07-03 ENCOUNTER — Ambulatory Visit (HOSPITAL_COMMUNITY)
Admission: RE | Admit: 2014-07-03 | Discharge: 2014-07-03 | Disposition: A | Discharge status: Home / Self Care | Payer: BLUE CROSS/BLUE SHIELD | Source: Ambulatory Visit | Attending: Pulmonary Disease | Admitting: Pulmonary Disease

## 2014-07-03 DIAGNOSIS — R161 Splenomegaly, not elsewhere classified: Secondary | ICD-10-CM | POA: Diagnosis not present

## 2014-07-03 DIAGNOSIS — Z9049 Acquired absence of other specified parts of digestive tract: Secondary | ICD-10-CM | POA: Insufficient documentation

## 2014-07-03 DIAGNOSIS — R1013 Epigastric pain: Secondary | ICD-10-CM | POA: Insufficient documentation

## 2014-07-22 ENCOUNTER — Telehealth: Payer: Self-pay | Admitting: Pulmonary Disease

## 2014-07-22 NOTE — Telephone Encounter (Signed)
Called and spoke to pt. Pt stated he was calling to give update for SN. Pt stated he is now on Enbrel and methotrexate has been D/C. Pt stated his LFT's are normal, his appetite is doing well and his RA is under control. Pt stated he is now taking Nexium once per day before dinner and it has been helping.   Will forward to SN as FYI.

## 2014-08-29 ENCOUNTER — Telehealth: Payer: Self-pay | Admitting: Pulmonary Disease

## 2014-08-29 MED ORDER — ESOMEPRAZOLE MAGNESIUM 40 MG PO CPDR: 40.0000 mg | DELAYED_RELEASE_CAPSULE | Freq: Every day | ORAL | Status: DC

## 2014-08-29 NOTE — Telephone Encounter (Signed)
Per SN >> okay to refill #90.  Rx has been sent in. Pt's wife is aware that this has been done. Nothing further was needed.

## 2014-08-29 NOTE — Telephone Encounter (Signed)
Appt is needed per last OV (06/2014)- pt was supposed to be seen 07/2014 but appt was never made.  Pt states that he is only taking Nexium 1 capsule daily  Pt states that when his MD took him off Methotrexate his digestive issues improved -pt reports starting to take Nexium only once daily and it seems to work well at this dose.  Pt is requesting that we only refill it as a 90 day supply (#90) at 1 capsule daily.   Pt states that he followed up in 4 weeks as advised but did not schedule an OV- he did this through a phone call.   Maurice March, RN at 07/22/2014 12:05 PM     Status: Signed       Expand All Collapse All   Called and spoke to pt. Pt stated he was calling to give update for SN. Pt stated he is now on Enbrel and methotrexate has been D/C. Pt stated his LFT's are normal, his appetite is doing well and his RA is under control. Pt stated he is now taking Nexium once per day before dinner and it has been helping.   Will forward to SN as FYI.        Please advise Dr Lenna Gilford if okay to refill Nexium at this new dosing and also if patient needs an appt sooner than 1 year follow up in August 2016. Thanks.

## 2014-09-05 ENCOUNTER — Telehealth: Payer: Self-pay | Admitting: Pulmonary Disease

## 2014-09-05 MED ORDER — DOXYCYCLINE HYCLATE 100 MG PO TABS: 100.0000 mg | ORAL_TABLET | Freq: Two times a day (BID) | ORAL | Status: DC

## 2014-09-05 NOTE — Telephone Encounter (Signed)
Spoke with pt, found a deer tick on his lower back yesterday, wife removed the entire tick, put alcohol and neosporin on it.  Site has some mild itching, no other issues with this at this time.  Pt is concerned about Lyme Disease especially since he is on Embrel, and wants to know if he needs an abx.   [facility] [place].  Dr. Lenna Gilford please advise.  Thanks!  Allergies  Allergen Reactions  . Caffeine     Causes him to go into afib  . Penicillins     REACTION: rash  . Pseudoephedrine-Ibuprofen    Current Outpatient Prescriptions on File Prior to Visit  Medication Sig Dispense Refill  . Ascorbic Acid (VITAMIN C) 500 MG tablet Take 500 mg by mouth every morning. once daily with rose hips    . aspirin EC 81 MG tablet Take 81 mg by mouth every morning.    . diltiazem (CARDIZEM CD) 120 MG 24 hr capsule TAKE 1 CAPSULE (120 MG TOTAL) BY MOUTH DAILY. 90 capsule 3  . esomeprazole (NEXIUM) 40 MG capsule Take 1 capsule (40 mg total) by mouth daily. 90 capsule 3  . etanercept (ENBREL) 50 MG/ML injection Inject 50 mg into the skin once a week. On mondays    . folic acid (FOLVITE) 165 MCG tablet Take 800 mcg by mouth daily.      [name] ibuprofen (ADVIL,MOTRIN) 200 MG tablet Take 800 mg by mouth. As needed for pain    . LORazepam (ATIVAN) 1 MG tablet Take 1 tablet (1 mg total) by mouth daily as needed for anxiety. 30 tablet 5  . methotrexate (RHEUMATREX) 2.5 MG tablet Take 2.5 mg by mouth once a week. 4 tablets by mouth every Monday     No current facility-administered medications on file prior to visit.

## 2014-09-05 NOTE — Telephone Encounter (Signed)
Per SN >> Doxycycline 100mg  BID #20.  Pt's wife is aware of SN recommendation. Rx will be sent in. Nothing further was needed.

## 2014-11-28 ENCOUNTER — Telehealth: Payer: Self-pay | Admitting: Pulmonary Disease

## 2014-11-28 NOTE — Telephone Encounter (Signed)
Per SN >> ok to refill, #90 with 5 refills  Rx has been refilled per SN.

## 2014-11-28 NOTE — Telephone Encounter (Signed)
ATIVAN last refilled 01/14/14 #30 x 5 refills Please advise SN if okay to refill. thanks

## 2015-01-16 ENCOUNTER — Ambulatory Visit (INDEPENDENT_AMBULATORY_CARE_PROVIDER_SITE_OTHER): Payer: BLUE CROSS/BLUE SHIELD | Admitting: Pulmonary Disease

## 2015-01-16 ENCOUNTER — Ambulatory Visit (INDEPENDENT_AMBULATORY_CARE_PROVIDER_SITE_OTHER)
Admission: RE | Admit: 2015-01-16 | Discharge: 2015-01-16 | Disposition: A | Discharge status: Home / Self Care | Payer: BLUE CROSS/BLUE SHIELD | Source: Ambulatory Visit | Attending: Pulmonary Disease | Admitting: Pulmonary Disease

## 2015-01-16 ENCOUNTER — Other Ambulatory Visit (INDEPENDENT_AMBULATORY_CARE_PROVIDER_SITE_OTHER): Payer: BLUE CROSS/BLUE SHIELD

## 2015-01-16 ENCOUNTER — Encounter: Payer: Self-pay | Admitting: Pulmonary Disease

## 2015-01-16 VITALS — BP 118/82 | HR 62 | Temp 97.3°F | Wt 226.2 lb

## 2015-01-16 DIAGNOSIS — M069 Rheumatoid arthritis, unspecified: Secondary | ICD-10-CM

## 2015-01-16 DIAGNOSIS — K219 Gastro-esophageal reflux disease without esophagitis: Secondary | ICD-10-CM | POA: Diagnosis not present

## 2015-01-16 DIAGNOSIS — K5730 Diverticulosis of large intestine without perforation or abscess without bleeding: Secondary | ICD-10-CM

## 2015-01-16 DIAGNOSIS — I480 Paroxysmal atrial fibrillation: Secondary | ICD-10-CM

## 2015-01-16 DIAGNOSIS — E7800 Pure hypercholesterolemia, unspecified: Secondary | ICD-10-CM

## 2015-01-16 DIAGNOSIS — Z87891 Personal history of nicotine dependence: Secondary | ICD-10-CM

## 2015-01-16 DIAGNOSIS — D239 Other benign neoplasm of skin, unspecified: Secondary | ICD-10-CM

## 2015-01-16 LAB — CBC WITH DIFFERENTIAL/PLATELET
BASOS ABS: 0 10*3/uL (ref 0.0–0.1)
Basophils Relative: 0.5 % (ref 0.0–3.0)
EOS PCT: 2.9 % (ref 0.0–5.0)
Eosinophils Absolute: 0.2 10*3/uL (ref 0.0–0.7)
HCT: 43.4 % (ref 39.0–52.0)
Hemoglobin: 15.1 g/dL (ref 13.0–17.0)
LYMPHS ABS: 1.6 10*3/uL (ref 0.7–4.0)
Lymphocytes Relative: 24.2 % (ref 12.0–46.0)
MCHC: 34.8 g/dL (ref 30.0–36.0)
MCV: 86.4 fl (ref 78.0–100.0)
MONO ABS: 0.6 10*3/uL (ref 0.1–1.0)
MONOS PCT: 9.7 % (ref 3.0–12.0)
NEUTROS ABS: 4.1 10*3/uL (ref 1.4–7.7)
NEUTROS PCT: 62.7 % (ref 43.0–77.0)
PLATELETS: 216 10*3/uL (ref 150.0–400.0)
RBC: 5.02 Mil/uL (ref 4.22–5.81)
RDW: 14.3 % (ref 11.5–15.5)
WBC: 6.5 10*3/uL (ref 4.0–10.5)

## 2015-01-16 LAB — LIPID PANEL
CHOLESTEROL: 152 mg/dL (ref 0–200)
HDL: 41.2 mg/dL (ref >39.00)
LDL Cholesterol: 93 mg/dL (ref 0–99)
NONHDL: 110.42
Total CHOL/HDL Ratio: 4
Triglycerides: 88 mg/dL (ref 0.0–149.0)
VLDL: 17.6 mg/dL (ref 0.0–40.0)

## 2015-01-16 LAB — MAGNESIUM: Magnesium: 2.1 mg/dL (ref 1.5–2.5)

## 2015-01-16 LAB — BASIC METABOLIC PANEL
BUN: 15 mg/dL (ref 6–23)
CO2: 29 meq/L (ref 19–32)
Calcium: 9.4 mg/dL (ref 8.4–10.5)
Chloride: 104 mEq/L (ref 96–112)
Creatinine, Ser: 1.14 mg/dL (ref 0.40–1.50)
GFR: 68.84 mL/min (ref >60.00)
GLUCOSE: 100 mg/dL — AB (ref 70–99)
POTASSIUM: 4.6 meq/L (ref 3.5–5.1)
SODIUM: 139 meq/L (ref 135–145)

## 2015-01-16 LAB — HEPATIC FUNCTION PANEL
ALBUMIN: 4.2 g/dL (ref 3.5–5.2)
ALK PHOS: 61 U/L (ref 39–117)
ALT: 21 U/L (ref 0–53)
AST: 21 U/L (ref 0–37)
BILIRUBIN DIRECT: 0.2 mg/dL (ref 0.0–0.3)
TOTAL PROTEIN: 6.6 g/dL (ref 6.0–8.3)
Total Bilirubin: 0.9 mg/dL (ref 0.2–1.2)

## 2015-01-16 LAB — TSH: TSH: 1.63 u[IU]/mL (ref 0.35–4.50)

## 2015-01-16 LAB — PSA: PSA: 0.8 ng/mL (ref 0.10–4.00)

## 2015-01-16 NOTE — Progress Notes (Addendum)
Subjective:    Patient ID: William West, male    DOB: 1951-03-12, 64 y.o.   MRN: [id number]  HPI 64 y/o WM, husb of William West, here for a follow up visit & CPX... ~  SEE PREV EPIC NOTES FOR OLDER DATA >>    CXR 8/13 showed normal heart size, clear lungs, low lung vols & sl elev of right hemidiaph, s/p GB...  EKG 8/13 showed NSR, rate62, wnl, NAD...  LABS 8/13:  FLP- ok x LDL=120 on diet alone;  Chems- wnl;  CBC- wnl;  TSH=1.81;  PSA=0.62   LABS 8/14:  FLP- at goals on diet x LDL=109;  Chems- wnl;  CBC- wnl;  TSH=1.90;  PSA=0.67...  ~  January 14, 2014:  Yearly ROV & CPX> William West reports a good yr overall; he continues to see William West for RA on Enbrel & MTX every 3-461mofor labs and OVs (we do not have recent notes from them);  He notes sl cough w/ greenish sput in the AMs but no f/c/s, chest discomfort, or SOB; we discussed ZPak Mucinex, Fluids for prn use;  He also had a bout of diarrhea 6/15=> went to ER w/ neg stoll studies, treated empirically w/ Cipro/Flagyl & symptoms resolved...     Ex-smoker> he quit in 2011 & has remained off cigs; breathing is good he says, no issues; exerc w/ yard work, splits wood, etc; CXR is clear...    PAF> on Diltiazem120 & Flecainide prn per William West "pill in the pocket" regimen (see below); he has used this med on only one occas during the last yr he says...    Chol> on diet alone & wt=230# (unchanged); FLP is stable w/ TChol 161, TG 117, HDL 37, LDL 100; we reviewed diet & wt reduction strategies...    Dyspepsia> on Prilosec 281md & this helps to control his symptoms; he saw William [facility] 11/12 at [facility] for RUQ pain after lifting- CT was neg & the pain was felt to be musculoskeletal pain; s/p lap chole 7/11 for stones...    [facility]> [facility]; colon 2011 was neg & f/u should be 7-1086yr.    RA> followed by DrBGlean West 3-61mo27moEnbrel & MTX (currently 4/wk)- this has really helped & doing very well on Rx; we do not have notes or labs  from Rheum...    Hx dysplastic nevus, fair skinned/ red hair> advised to get Derm eval at least yearly & he promises to do so... We reviewed prob list, meds, xrays and labs> see below for updates >>   CXR 8/15 showed norm heart size, clear lungs, NAD...  LABS 6/15 via ER>  Chems- wnl;  CBC- wnl   LABS 8/15:  FLP- ok on diet alone;  TSH= 1.86;  PSA= 0.87   ~  March 05, 2014:  6wk ROV & add-on appt requested for 5d hx pain, tenderness, discomfort in suprapubic area but denies dysuria, hematuria, foul smelling urine, etc;  He notes "pressure" but no burning, no flankl or back pain, etc and he notes urine stream appears ok w/ good force;  Initially he felt chilled, shakey, but didn't take temp- he notes that tylenol helped;  States he had similar episode 1-2 yrs ago & symptoms resolved w/ meds called in for him at that time...  Exam is essentially neg- except min tender over bladder, neg CVAT, prostate is 2+ and normal, stool heme neg... We decided to check UA (clear), and Labs (all wnl- chems, CBC, Sed); and  cover empirically w/ Fluids and cranberry juice, ok to continue Tylenol, we are holding antibiotics until C&S returns... We reviewed prob list, meds, xrays and labs> see below for updates >>   LABS 10/15:  Chems- wnl w/ Cr=1.1;  CBC- wnl w/ Hg=13.9 & WBC=7.7;  Sed=17;  UA- clear w/ C&S pending...   ~  July 02, 2014:  58moROV & add-on appt requested for Abd Pain> states this is going on for several months and seems progressive- now getting "attacks" 3 times per week; states the discomfort is similar to prev GB attacks (he had GB removed by William West in 2011); c/o lots of indigestion- notes some nausea & vomits on occas then feels better; says appetite is fair, eats ok w/o swallowing issues, then 30 mon later "it sits on my stomach"; he has noted white stool yest & states his urine has been darker; he has been on OTC Omeprazole20 w/o help he says... NOTE> he had recheck by William West for abd  pain 145yrfter GB surg- they did CT Abd and it was NEG (prev cholecystectomy, sm hepatic dome cyst, diverticulosis, NAD)...William KitchenMarland West  He has hx PAF & is followed by William West> last seen 11/15 & said to be doing well on the "pill in the pocket"strategy for control; rare palpit, no CP/ SOB; on ASA81, Diltiazem12058mam and Flecainide 100m15m needed for AFib (hasn't needed)...    He is followed by [name] [id number]>[id number] on Enbrel & MTX => we do not have notes from him... We reviewed prob list, meds, xrays and labs> see below for updates >>   LABS 2/16:  Chems- wnl x GOT=120 GPT=248 (these were prev wnl);  AlkPhos=103, Amylase=50 (27-131) & Lipase=36 (11-59);  CBC- wnl;  Sed=5...   Abd Ultrasound 2/16 showed absent GB, ducts ok, poor vis of liver & pancreas, elarged spleen, kidneys ok, no other signif abn noted... PLAN>> Hx progressive abd discomfort w/ "attacks" of pain & some n/v; Labs w/ abn GOT/GPT but norm AlkPhos, Amylase/Lipase, and Sed=5; Sonar w/ splenomegaly, poor vis of liver & pancreas; we discussed Rx w/ stronger PPI for now- Nexium40 bid and refer to GI for further evaluation (?CT Abd and EGD?) and rx... ADDENDUM>> Pt called 07/22/14 w/ update- now on Enbrel & off MTX, LFTs are normal, appetite is good & RA under control, on Nexium40 before dinner & this is helping he says...  ~  January 16, 2015:  19mo R5mo William Leamanre for his CPX> prev symptoms resolved and LFTs ret to normal off MTX;  Now feeling well, breathing is good w/o cough/ sput/ SOB etc... We reviewed the following medical problems during today's office visit >>     Ex-smoker> he quit in 2011 & has remained off cigs; breathing is good he says, no issues; exerc w/ yard work, splits wood, etc; CXR is clear...    PAF> on Diltiazem120 & off Flecainide (prn per William West "pill in the pocket" regimen); he has used this med on only one occas during the last few yrs.    Chol> on diet alone & wt=226#; FLP is stable w/ TChol 152, TG 88,  HDL 41, LDL 93; we reviewed diet & wt reduction strategies...    Dyspepsia> on Nexium 40mg/d58mhis helps to control his symptoms; he saw [facility] 11/12 at [facility] for RUQ pain after lifting- CT was neg & the pain was felt to be musculoskeletal pain; s/p lap chole 7/11 for stones; GI symptoms 2/16  resolved off MTX... ...    Divertics> followed by [facility] for GI; colon 2011 was neg & f/u should be 7-35yr...    RA> followed by DGlean Salenevery 3-679mon Enbrel & off MTX now- doing very well on monotherapy; we do not have notes or labs from Rheum...    Hx dysplastic nevus, fair skinned/ red hair> advised to get Derm eval at least yearly & he promises to do so... We reviewed prob list, meds, xrays and labs> see below for updates >> up to date on Immuniz...  CXR 8/16 showed norm heart size, clear lungs, NAD...   LABS 8/16:  FLP- at goals on diet alone;  Chems- wnl;  CBC- wnl;  TSH=1.63;  PSA=0.80;  Mg=2.1 (wnl)...           Problem List:     Ex-CIGARETTE SMOKER (ICD-305.1) - started as teen, up to 1/2 ppd, quit for up to 5y48yrrecently 1/2 ppd but he says he quit 6/11 & has remained quit!  No hx resp tract diseases...  ~  baseline CXRs showed clear, NAD... William West  CXR 7/11 showed essent clear lungs, +gallstone seen in right abd... ~  CXR 8/13 showed normal heart size, clear lungs, low lung vols & sl elev of right hemidiaph, s/p GB... ~  CXR 8/15 showed norm heart size, clear lungs, NAD. ~  CXR 8/16 showed norm heart size, clear lungs, NAD  PAF >> managed by William West for Cards- first diagnosed 11/13 when he presented to ER w/ palpit; placed on CCB and converted to NSR... ~  He remains stable on Diltiazem 120 Qam and Flecainide "pill in the pocket" strategy (100m61mn palpit)... ~  EKG 11/15 showed NSR, rate75, NAD...   HYPERCHOLESTEROLEMIA, BORDERLINE (ICD-272.4) - currently on diet Rx alone. ~  FLP 1/09 showed TChol 198, TG 127, HDL 34, LDL 138 ~  FLP 6/10 showed TChol 170, TG 96, HDL 40, LDL 111 ~   FLP 6/11 showed TChol 184, TG 104, HDL 43, LDL 120... rec low chol/ low fat diet. ~  FLP 8/12 (wt=237#) showed TChol 183, TG 97, HDL 43, LDL 121... Not really on diet "I just eat" ~  FLP 8/13 (wt=235#) showed TChol 176, TG 78, HDL 41, LDL 120 ~  FLP 8/14 on diet alone (wt=234#) showed TChol 166, TG 78, HDL 41, LDL 109 ~  FLP 8/15 on diet alone (wt=230#) showed TChol 161, TG 117, HDL 37, LDL 100 ~  FLP 8/16 on diet alone (wt=226#) showed TChol 152, TG 88, HDL 41, LDL 93  DYSPEPSIA (ICD-536.8) - takes PRILOSEC OTC daily... no dysphagia, N/V, abd pain, etc... no prev endo etc... states he really needs it daily & has pain/ difficulty if he stops the Rx. ~  S/p lap chole 7/11 by William West for cholecystitis & stones... ~  2012: he had recurrent abd pain & saw William West who did a CT Abd&Pelvis- NEG, NAD (prev cholecystectomy, sm hepatic dome cyst, diverticulosis, NAD); pain felt to be musculoskeletal...  DIVERTICULOSIS OF COLON (ICD-562.10)  ~  He had colonoscopy 1/06 from DrMann= divertics, tiny polyp (path=norm mucosa), hems... ~  F/u colon 11/11 by DrNat-Mann w/ divertics, hems, sm rectal polyps= fragments of benign mucosa only...  ABDOMINAL PAIN & ELEVATED HEPATOCELLULAR ENZYMES >> resolved off MTX ~  2/16: he presented w/ several month hx abd discomfort and seems progressive- now getting "attacks" 3 times per week; states the discomfort is similar to prev GB attacks (he had GB removed by William West in 2011);  c/o lots of indigestion- notes some nausea & vomits on occas then feels better; says appetite is fair, eats ok w/o swallowing issues, then 30 mon later "it sits on my stomach"; he has noted white stool yest & states his urine has been darker; he has been on OTC Omeprazole20 w/o help he says... NOTE> he had recheck by William West for abd pain 20yrafter GB surg- they did CT Abd and it was NEG (prev cholecystectomy, sm hepatic dome cyst, diverticulosis, NAD)..William KitchenMarland KitchenWe placed him on Nexium40Bid, and  referred him to GI for further eval & rx... ~  LABS 2/16 revealed> Chems- wnl x GOT=120 GPT=248 (these were prev wnl); AlkPhos=103, Amylase=50 (27-131) & Lipase=36 (11-59); CBC- wnl;  Sed=5...  ~  Abd Ultrasound 2/16 showed absent GB, ducts ok, poor vis of liver & pancreas, elarged spleen, kidneys ok, no other signif abn noted.. ~  All symptoms resolved off MTX & LFTs ret to normal...  Hx of MONONUCLEOSIS (ICD-075) - in high school... had mono hepatitis... no known sequellae... he also had RMSF at age 64 pretty sick, recovered w/o sequellae. ~  labs have consistently shown normal LFTs x during his cholecystitis presentation 7/11...  RHEUMATOID ARTHRITIS >> Diagnosed w/ sero-pos RA 10/11 & referred to Rheum, William West- on MTX currently 6/wk & occas Pred taper... ~  8/13:  He tells me that DCamdenstarted ENBREL about 6 weeks ago 7 he is already feeling better (we do not have notes from Rheum). ~  8/14:  followed by DGlean Salenon Enbrel & MTX (currently 4/wk)- this has really helped & doing very well on Rx... ~  He continues to f/u w/ Rheum every 3-438moor exam & labs; [facility]... ~  Now on Enbrel alone, off MTX due to elev LFTs...  Hx of DYSPLASTIC NEVUS (ICD-216.9) - removed from base of neck/ upper back by DrHensel/ DrCrowe in 1993... ~  8/13:  He is reminded to check w/ Derm at least yearly due to his fair complexion, red hair, sun exposure, etc... ~  8/14:  He has not yet followed up w/ derm & promises to do so soon...  HEALTH MAINTENANCE:  on ASA 8138maily... ~  GI:  colonoscopy 1/06 & 11/11 by DrMann as above... ~  GU: neg DRE & PSA here remains wnl... ~  Immunizations:  age 54 61w> exsmoker- given PNEUMOVAX 6/10;  given TETANUS shot 6/10; discussed yearly FLU vaccines.   Current Medications, Allergies, Past Medical History, Past Surgical History, Family History, and Social History were reviewed in [employer].   Past Surgical History  Procedure Laterality Date  . Tonsillectomy and adenoidectomy  as a child  . L foot injury  age [age] w/ part amputation great toe  . Dysplastic nevus  07/1991    excised from upper back  . Cholecystectomy      laparoscopic    Outpatient Encounter Prescriptions as of 01/16/2015  Medication Sig  . Ascorbic Acid (VITAMIN C) 500 MG tablet Take 500 mg by mouth every morning. once daily with rose hips  . aspirin EC 81 MG tablet Take 81 mg by mouth every morning.  . diltiazem (CARDIZEM CD) 120 MG 24 hr capsule TAKE 1 CAPSULE (120 MG TOTAL) BY MOUTH DAILY.  . eMarland Kitchenomeprazole (NEXIUM) 40 MG capsule Take 1 capsule (40 mg total) by mouth daily.  . eMarland Kitchenanercept (ENBREL) 50 MG/ML injection Inject 50 mg into the skin once  a week. On mondays  . folic acid (FOLVITE) 332 MCG tablet Take 800 mcg by mouth daily.    William West ibuprofen (ADVIL,MOTRIN) 200 MG tablet Take 800 mg by mouth. As needed for pain  . LORazepam (ATIVAN) 1 MG tablet TAKE 1/2 TO 1 TABLET BY MOUTH THREE TIMES A DAY AS NEEDED  . doxycycline (VIBRA-TABS) 100 MG tablet Take 1 tablet (100 mg total) by mouth 2 (two) times daily. (Patient not taking: Reported on 01/16/2015)  . methotrexate (RHEUMATREX) 2.5 MG tablet Take 2.5 mg by mouth once a week. 4 tablets by mouth every Monday   No facility-administered encounter medications on file as of 01/16/2015.    Allergies  Allergen Reactions  . Caffeine     Causes him to go into afib  . Methotrexate Derivatives Nausea And Vomiting  . Penicillins     REACTION: rash  . Pseudoephedrine-Ibuprofen     Immunization History  Administered Date(s) Administered  . Influenza Split 02/22/2011, 03/14/2012, 03/16/2013, 02/08/2014  . Pneumococcal Polysaccharide-23 10/24/2008  . Td 09/21/2001, 10/24/2008  . Tdap 03/07/2014    Review of Systems        See HPI - all other systems neg except as noted... Presents 2/16 c/o abd discomfort, intermit n/v, white stoools and dark  urine...       The patient denies anorexia, fever, weight loss, weight gain, vision loss, decreased hearing, hoarseness, chest pain, syncope, dyspnea on exertion, peripheral edema, prolonged cough, headaches, hemoptysis, melena, hematochezia, hematuria, incontinence, muscle weakness, suspicious skin lesions, transient blindness, difficulty walking, depression, unusual weight change, abnormal bleeding, enlarged lymph nodes, and angioedema.   Prev c/o omplains of joint pain, joint swelling, loss of strength, and stiffness (all improved on MTX); denies joint redness, low back pain, mid back pain, muscle aches, cramps, and thoracic pain.   Objective:   Physical Exam     WD, Overweight, 64 y/o WM in NAD... GENERAL:  Alert & oriented; pleasant & cooperative... HEENT:  [place]/AT, EOM-wnl, PERRLA, Fundi-benign, EACs-clear, TMs-wnl, NOSE-clear, THROAT-clear & wnl. NECK:  Supple w/ full ROM; no JVD; normal carotid impulses w/o bruits; no thyromegaly or nodules palpated; no lymphadenopathy. CHEST:  Clear to P & A; without wheezes/ rales/ or rhonchi. HEART:  Regular Rhythm; without murmurs/ rubs/ or gallops. ABDOMEN:  Soft & non-tender; s/p lap chole, normal bowel sounds; no organomegaly or masses detected. RECTAL:  Neg - prostate 2+ & nontender w/o nodules; stool hematest neg EXT: deformity L great toe, sl swelling & tender MCPs, some IPs, & wrist; no varicose veins/ venous insuffic/ or edema. NEURO:  CN's intact; motor testing normal; sensory testing normal; gait normal & balance OK. DERM:  scar in upper back at base of neck toward R side, no new lesions noted...  RADIOLOGY DATA:  Reviewed in the EPIC EMR & discussed w/ the patient...  LABORATORY DATA:  Reviewed in the EPIC EMR & discussed w/ the patient...   Assessment & Plan:    PAF>  New prob 11/13- followed by William West on Cardizem & Flecainide prn...  CHOL>  As noted w/ LDL down to 93 - needs better diet, wt reduction or consider low dose statin  rx, he will decide...  Dyspepsia>  On Nexium daily...  Divertics/ sm polyps>  follwed by DrNat-Mann & up to date on colon screening etc...  Hx of abd discomfort assoc w/ "attacks" of pain, indigestion, & intermit n/v; Labs w/ elev GOT/GPT; Sonar w/ splenomegaly but poor vis of liver/pancreas; placed on NEXIUM40Bid & referred to  GI for further eval &rx => all symptoms resolved off MTX and LFTs ret ro normal...  Urinary symptoms 10/15>> hx is somewhat vague & seems minor; min tender over bladder otherw norm exam; Labs & UA are clear/wnl; Rec Fluids, Cranberry juice, tylenol prn, & observe; symptoms resolved...  Sero-positive RA>  Followed by Glean Salen on ENBREL & now off MTX...  Other medical problems as noted...  CPX>  He quit smoking 6/11;  Baseline EKG w/ NSR, NSSTTWA, NAD;  Baseline CXR = NAD; Labs look good as well...    Patient's Medications  New Prescriptions   No medications on file  Previous Medications   ASCORBIC ACID (VITAMIN C) 500 MG TABLET    Take 500 mg by mouth every morning. once daily with rose hips   ASPIRIN EC 81 MG TABLET    Take 81 mg by mouth every morning.   CHOLECALCIFEROL (VITAMIN D3) 2000 UNITS CHEW    Chew 1 tablet by mouth daily.   DILTIAZEM (CARDIZEM CD) 120 MG 24 HR CAPSULE    TAKE 1 CAPSULE (120 MG TOTAL) BY MOUTH DAILY.   DOXYCYCLINE (VIBRA-TABS) 100 MG TABLET    Take 1 tablet (100 mg total) by mouth 2 (two) times daily.   ESOMEPRAZOLE (NEXIUM) 40 MG CAPSULE    Take 1 capsule (40 mg total) by mouth daily.   ETANERCEPT (ENBREL) 50 MG/ML INJECTION    Inject 50 mg into the skin once a week. On mondays   FOLIC ACID (FOLVITE) 111 MCG TABLET    Take 800 mcg by mouth daily.     IBUPROFEN (ADVIL,MOTRIN) 200 MG TABLET    Take 800 mg by mouth. As needed for pain   LORAZEPAM (ATIVAN) 1 MG TABLET    TAKE 1/2 TO 1 TABLET BY MOUTH THREE TIMES A DAY AS NEEDED   MAGNESIUM 200 MG TABS    Take 1 tablet by mouth daily.   METHOTREXATE (RHEUMATREX) 2.5 MG TABLET    Take 2.5  mg by mouth once a week. 4 tablets by mouth every Monday  Modified Medications   No medications on file  Discontinued Medications   No medications on file

## 2015-01-16 NOTE — Patient Instructions (Signed)
Today we updated your med list in our EPIC system...    Continue your current medications the same...  Today we did your follow up blood work & CXR...    We will contact you w/ the results when available...   Call for any questions...  Let's plan a follow up visit in 78yr, sooner if needed for problems.Marland KitchenMarland Kitchen

## 2015-01-22 ENCOUNTER — Telehealth: Payer: Self-pay | Admitting: Pulmonary Disease

## 2015-01-22 DIAGNOSIS — K921 Melena: Secondary | ICD-10-CM

## 2015-01-22 NOTE — Telephone Encounter (Signed)
Pt cb, please cb at previous number listed °

## 2015-01-22 NOTE — Telephone Encounter (Signed)
Pt called and stated the he has been experiencing black/dark red firm stool x 2 days Pt denies stomach discomfort, cramps, n/v, or fever Pt denies changing diet or medications since last office visit on 01/17/2015 He is concerned about the color change and would like SN rec of what to do  Dr Lenna Gilford, please advise   Allergies  Allergen Reactions  . Caffeine     Causes him to go into afib  . Methotrexate Derivatives Nausea And Vomiting  . Penicillins     REACTION: rash  . Pseudoephedrine-Ibuprofen

## 2015-01-22 NOTE — Telephone Encounter (Signed)
Left message for pt to call back to discuss concerns.

## 2015-01-22 NOTE — Telephone Encounter (Signed)
Spoke to Dr Annamaria Boots in regards to pt's concerns since SN is out of office   Per CY, Orders were given for CBC w/ diff, BMET, PT/INR and stool cards x 3 Inform pt that if he has GI specialist he needs to contact their office about symptoms as well Inform pt to that if he starts to experience abdominal pain, n/v or fever to head to the nearest ER for eval  Called pt and informed of CY rec Pt stated that his GI specialist is Dr Collene Mares and he would call her first thing in the morning Pt stated that he is in [place] right now and can not do blood work until tomorrow morning Pt understand that if symptoms become worse he needs to head to the nearest ER  Will route the message to Dr Lenna Gilford for review to see if there are any further rec Dr Lenna Gilford, please advise

## 2015-01-23 ENCOUNTER — Telehealth: Payer: Self-pay | Admitting: Pulmonary Disease

## 2015-01-23 NOTE — Telephone Encounter (Signed)
Called to speak with pt. He is scheduled to see Dr. Collene Mares this AM at 10:30 for appt. Nothing further needed

## 2015-01-23 NOTE — Telephone Encounter (Signed)
Called spoke with pt. He was calling to let us know he is going to see Dr. Collene Mares today. Nothing further needed

## 2015-01-23 NOTE — Telephone Encounter (Signed)
[phone] or [phone], pt cb states he has collected a stool sample and wil bring it into the office to get things going instead of coming to get cards and prolonging the process

## 2015-01-24 ENCOUNTER — Observation Stay (HOSPITAL_COMMUNITY)
Admission: RE | Admit: 2015-01-24 | Discharge: 2015-01-26 | Disposition: A | Discharge status: Home / Self Care | Payer: BLUE CROSS/BLUE SHIELD | Source: Ambulatory Visit | Attending: Gastroenterology | Admitting: Gastroenterology

## 2015-01-24 ENCOUNTER — Encounter (HOSPITAL_COMMUNITY): Payer: Self-pay | Admitting: *Deleted

## 2015-01-24 ENCOUNTER — Encounter (HOSPITAL_COMMUNITY)
Admission: RE | Disposition: A | Discharge status: Home / Self Care | Payer: Self-pay | Source: Ambulatory Visit | Attending: Gastroenterology

## 2015-01-24 DIAGNOSIS — K922 Gastrointestinal hemorrhage, unspecified: Secondary | ICD-10-CM | POA: Diagnosis present

## 2015-01-24 DIAGNOSIS — D62 Acute posthemorrhagic anemia: Secondary | ICD-10-CM | POA: Diagnosis not present

## 2015-01-24 DIAGNOSIS — Z87891 Personal history of nicotine dependence: Secondary | ICD-10-CM | POA: Insufficient documentation

## 2015-01-24 DIAGNOSIS — K219 Gastro-esophageal reflux disease without esophagitis: Secondary | ICD-10-CM | POA: Insufficient documentation

## 2015-01-24 DIAGNOSIS — M069 Rheumatoid arthritis, unspecified: Secondary | ICD-10-CM | POA: Diagnosis not present

## 2015-01-24 DIAGNOSIS — I4891 Unspecified atrial fibrillation: Secondary | ICD-10-CM | POA: Insufficient documentation

## 2015-01-24 DIAGNOSIS — K5731 Diverticulosis of large intestine without perforation or abscess with bleeding: Secondary | ICD-10-CM | POA: Diagnosis not present

## 2015-01-24 DIAGNOSIS — Z79899 Other long term (current) drug therapy: Secondary | ICD-10-CM | POA: Insufficient documentation

## 2015-01-24 DIAGNOSIS — Z7982 Long term (current) use of aspirin: Secondary | ICD-10-CM | POA: Diagnosis not present

## 2015-01-24 DIAGNOSIS — K8050 Calculus of bile duct without cholangitis or cholecystitis without obstruction: Secondary | ICD-10-CM | POA: Insufficient documentation

## 2015-01-24 HISTORY — PX: GIVENS CAPSULE STUDY: SHX5432

## 2015-01-24 HISTORY — PX: COLONOSCOPY: SHX5424

## 2015-01-24 HISTORY — DX: Gastrointestinal hemorrhage, unspecified: K92.2

## 2015-01-24 HISTORY — DX: Gastro-esophageal reflux disease without esophagitis: K21.9

## 2015-01-24 LAB — CBC
HCT: 34.1 % — ABNORMAL LOW (ref 39.0–52.0)
Hemoglobin: 11.5 g/dL — ABNORMAL LOW (ref 13.0–17.0)
MCH: 29 pg (ref 26.0–34.0)
MCHC: 33.7 g/dL (ref 30.0–36.0)
MCV: 86.1 fL (ref 78.0–100.0)
PLATELETS: 208 10*3/uL (ref 150–400)
RBC: 3.96 MIL/uL — ABNORMAL LOW (ref 4.22–5.81)
RDW: 14.7 % (ref 11.5–15.5)
WBC: 6.3 10*3/uL (ref 4.0–10.5)

## 2015-01-24 SURGERY — IMAGING PROCEDURE, GI TRACT, INTRALUMINAL, VIA CAPSULE
Anesthesia: LOCAL

## 2015-01-24 MED ORDER — MAGNESIUM OXIDE 400 (241.3 MG) MG PO TABS
200.0000 mg | ORAL_TABLET | Freq: Every day | ORAL | Status: DC
Start: 2015-01-24 — End: 2015-01-24

## 2015-01-24 MED ORDER — DILTIAZEM HCL ER COATED BEADS 120 MG PO CP24
120.0000 mg | ORAL_CAPSULE | Freq: Every day | ORAL | Status: DC
  Filled 2015-01-24 (×2): qty 1

## 2015-01-24 MED ORDER — INFLUENZA VAC SPLIT QUAD 0.5 ML IM SUSY
0.5000 mL | PREFILLED_SYRINGE | INTRAMUSCULAR | Status: DC
Start: 2015-01-25 — End: 2015-01-26
  Filled 2015-01-24: qty 0.5

## 2015-01-24 MED ORDER — SODIUM CHLORIDE 0.9 % IV SOLN
INTRAVENOUS | Status: DC
  Administered 2015-01-24 – 2015-01-25 (×3): via INTRAVENOUS

## 2015-01-24 MED ORDER — PANTOPRAZOLE SODIUM 40 MG PO TBEC: 40.0000 mg | DELAYED_RELEASE_TABLET | Freq: Every day | ORAL | Status: DC

## 2015-01-24 MED ORDER — DILTIAZEM HCL ER COATED BEADS 120 MG PO CP24: 120.0000 mg | ORAL_CAPSULE | Freq: Every day | ORAL | Status: DC

## 2015-01-24 MED ORDER — PANTOPRAZOLE SODIUM 40 MG PO TBEC
40.0000 mg | DELAYED_RELEASE_TABLET | Freq: Every day | ORAL | Status: DC
  Administered 2015-01-24: 40 mg via ORAL
  Filled 2015-01-24 (×2): qty 1

## 2015-01-24 MED ORDER — LORAZEPAM 1 MG PO TABS: 1.0000 mg | ORAL_TABLET | Freq: Three times a day (TID) | ORAL | Status: DC | PRN

## 2015-01-24 MED ORDER — SODIUM CHLORIDE 0.9 % IV SOLN: INTRAVENOUS | Status: DC

## 2015-01-24 SURGICAL SUPPLY — 1 items: TOWEL COTTON PACK 4EA (MISCELLANEOUS) ×4 IMPLANT

## 2015-01-24 NOTE — H&P (Signed)
William West is an 64 y.o. male.   Chief Complaint: Melena with intermittent BRPBR. HPI: 64 year old white male, seen emergently in the office yesterday for a 4 day history of melena and some ?BRBPR, His hemoglobin was 15 gm/dl on 01/16/15 and dropped to 13.1 gm/dl yesterday. He had an EGD and a colonoscopy done today when multiple large gastric polyps were noted [but not removed] and scattered sigmoid diverticula were seen in the colon. No source of blood loss was identified. There was some fresh blood in the colon but no old or fresh heme in the stomach or proximal small bowel. He had a lot of rectal bleeding with the prep last night and therefore a capsule study was set up for him at [facility] today. As he had been having some rectal bleeding with the prep and it would take 12 hours for the capsule finish he was admitted for 24 hours observation. His Aspirin was held yesterday, He denies using any other NSAIDS.  Past Medical History  Diagnosis Date  . Allergic rhinitis   . Dermatitis   . Tobacco use disorder   . Hypercholesterolemia   . Diverticulosis of colon   . Mononucleosis   . Acute arthritis   . Dysplastic nevus   . A-fib   . RA (rheumatoid arthritis)   . Atrial fibrillation     history of a fib  . GERD (gastroesophageal reflux disease)   . GI bleeding 01/24/2015   Past Surgical History  Procedure Laterality Date  . Tonsillectomy and adenoidectomy  as a child  . L foot injury  age 49    w/ part amputation great toe  . Dysplastic nevus  07/1991    excised from upper back  . Cholecystectomy      laparoscopic  . Colonoscopy  01/24/2015  . Givens capsule study  01/24/2015   Family History  Problem Relation Age of Onset  . Prostate cancer Father   . Other Father     pacemaker  . Heart disease Mother   . Other Mother     vascular disease   Social History:  reports that he quit smoking about 5 years ago. His smoking use included Cigarettes. He has a 22.5 pack-year  smoking history. He has never used smokeless tobacco. He reports that he drinks alcohol. He reports that he does not use illicit drugs.  Allergies:  Allergies  Allergen Reactions  . Caffeine     Causes him to go into afib  . Flecainide Other (See Comments)    Caused heart to race too fast and had him in the ED  . Methotrexate Derivatives Nausea And Vomiting  . Penicillins     REACTION: rash  . Pseudoephedrine-Ibuprofen     afib   Medications Prior to Admission  Medication Sig Dispense Refill  . Ascorbic Acid (VITAMIN C) 500 MG tablet Take 500 mg by mouth every morning. once daily with rose hips    . aspirin EC 81 MG tablet Take 81 mg by mouth every morning.    . Cholecalciferol (VITAMIN D3) 2000 UNITS CHEW Chew 1 tablet by mouth daily.    [name] diltiazem (CARDIZEM CD) 120 MG 24 hr capsule TAKE 1 CAPSULE (120 MG TOTAL) BY MOUTH DAILY. 90 capsule 3  . esomeprazole (NEXIUM) 40 MG capsule Take 1 capsule (40 mg total) by mouth daily. (Patient taking differently: Take 40 mg by mouth daily. Around 3 pm) 90 capsule 3  . etanercept (ENBREL) 50 MG/ML injection  Inject 50 mg into the skin once a week. On mondays    . folic acid (FOLVITE) 836 MCG tablet Take 800 mcg by mouth daily.      [name] ibuprofen (ADVIL,MOTRIN) 200 MG tablet Take 800 mg by mouth daily as needed (arthritis).     . LORazepam (ATIVAN) 1 MG tablet TAKE 1/2 TO 1 TABLET BY MOUTH THREE TIMES A DAY AS NEEDED 90 tablet 5  . Magnesium 250 MG TABS Take 250 mg by mouth every other day.    . Multiple Vitamin (MULTIVITAMIN WITH MINERALS) TABS tablet Take 1 tablet by mouth daily.     Results for orders placed or performed during the hospital encounter of 01/24/15 (from the past 48 hour(s))  CBC     Status: Abnormal   Collection Time: 01/24/15  1:10 PM  Result Value Ref Range   WBC 6.3 4.0 - 10.5 K/uL   RBC 3.96 (L) 4.22 - 5.81 MIL/uL   Hemoglobin 11.5 (L) 13.0 - 17.0 g/dL   HCT 34.1 (L) 39.0 - 52.0 %   MCV 86.1 78.0 - 100.0 fL   MCH 29.0  26.0 - 34.0 pg   MCHC 33.7 30.0 - 36.0 g/dL   RDW 14.7 11.5 - 15.5 %   Platelets 208 150 - 400 K/uL   Review of Systems  Constitutional: Negative.   HENT: Negative.   Eyes: Negative.   Respiratory: Negative.   Cardiovascular: Negative.   Gastrointestinal: Positive for heartburn, nausea, blood in stool and melena. Negative for vomiting, abdominal pain, diarrhea and constipation.  Genitourinary: Negative.   Musculoskeletal: Positive for joint pain.  Skin: Negative.   Neurological: Negative.   Endo/Heme/Allergies: Negative.   Psychiatric/Behavioral: Negative.    Blood pressure 126/72, pulse 70, temperature 97.4 F (36.3 C), temperature source Oral, resp. rate 20, height 6' (1.829 m), weight 98.884 kg (218 lb), SpO2 100 %. Physical Exam  Constitutional: He is oriented to person, place, and time. He appears well-developed and well-nourished.  HENT:  Head: Normocephalic and atraumatic.  Eyes: Conjunctivae and EOM are normal. Pupils are equal, round, and reactive to light.  Neck: Normal range of motion. Neck supple.  Cardiovascular: Normal rate and regular rhythm.   Respiratory: Effort normal and breath sounds normal.  GI: Soft. Bowel sounds are normal.  Musculoskeletal: Normal range of motion.  Neurological: He is alert and oriented to person, place, and time.  Skin: Skin is warm and dry.  Psychiatric: He has a normal mood and affect. His behavior is normal. Judgment and thought content normal.   Assessment/Plan 1) Melena with BRBPR last night; Will admit to the hospital for 24 hours observation and complete his capsule study. Will check a CBC today. Dr. Hilarie Fredrickson is aware of thi s patient. He will read the capsule tomorrow and make further recommendations as needed. 2) Colonic diverticulosis.3) GERD on Nexium.  4) Rheumatoid arthritis: on Embrel.  5) History of atrial fibrillation on Cardiazem. William West 01/24/2015, 3:17 PM

## 2015-01-25 ENCOUNTER — Observation Stay (HOSPITAL_COMMUNITY): Payer: BLUE CROSS/BLUE SHIELD

## 2015-01-25 DIAGNOSIS — D62 Acute posthemorrhagic anemia: Secondary | ICD-10-CM | POA: Diagnosis not present

## 2015-01-25 DIAGNOSIS — K922 Gastrointestinal hemorrhage, unspecified: Secondary | ICD-10-CM | POA: Diagnosis not present

## 2015-01-25 DIAGNOSIS — K5791 Diverticulosis of intestine, part unspecified, without perforation or abscess with bleeding: Secondary | ICD-10-CM | POA: Diagnosis not present

## 2015-01-25 DIAGNOSIS — K8050 Calculus of bile duct without cholangitis or cholecystitis without obstruction: Secondary | ICD-10-CM | POA: Diagnosis not present

## 2015-01-25 DIAGNOSIS — K5731 Diverticulosis of large intestine without perforation or abscess with bleeding: Secondary | ICD-10-CM | POA: Diagnosis not present

## 2015-01-25 LAB — BASIC METABOLIC PANEL
Anion gap: 4 — ABNORMAL LOW (ref 5–15)
BUN: 7 mg/dL (ref 6–20)
CALCIUM: 8.1 mg/dL — AB (ref 8.9–10.3)
CO2: 27 mmol/L (ref 22–32)
CREATININE: 1.09 mg/dL (ref 0.61–1.24)
Chloride: 106 mmol/L (ref 101–111)
GFR calc Af Amer: >60 mL/min (ref >60)
GLUCOSE: 126 mg/dL — AB (ref 65–99)
Potassium: 3.4 mmol/L — ABNORMAL LOW (ref 3.5–5.1)
SODIUM: 137 mmol/L (ref 135–145)

## 2015-01-25 LAB — CBC
HEMATOCRIT: 30.1 % — AB (ref 39.0–52.0)
HEMOGLOBIN: 10.4 g/dL — AB (ref 13.0–17.0)
MCH: 30.1 pg (ref 26.0–34.0)
MCHC: 34.6 g/dL (ref 30.0–36.0)
MCV: 87 fL (ref 78.0–100.0)
Platelets: 169 10*3/uL (ref 150–400)
RBC: 3.46 MIL/uL — ABNORMAL LOW (ref 4.22–5.81)
RDW: 14.9 % (ref 11.5–15.5)
WBC: 5 10*3/uL (ref 4.0–10.5)

## 2015-01-25 LAB — PROTIME-INR
INR: 1.19 (ref 0.00–1.49)
Prothrombin Time: 15.3 seconds — ABNORMAL HIGH (ref 11.6–15.2)

## 2015-01-25 MED ORDER — IOHEXOL 300 MG/ML  SOLN
25.0000 mL | INTRAMUSCULAR | Status: AC
  Administered 2015-01-25 (×2): 25 mL via ORAL

## 2015-01-25 MED ORDER — LORAZEPAM 0.5 MG PO TABS: 0.5000 mg | ORAL_TABLET | Freq: Four times a day (QID) | ORAL | Status: DC | PRN

## 2015-01-25 MED ORDER — DILTIAZEM HCL ER COATED BEADS 120 MG PO CP24
120.0000 mg | ORAL_CAPSULE | Freq: Every day | ORAL | Status: DC
  Administered 2015-01-25 – 2015-01-26 (×2): 120 mg via ORAL
  Filled 2015-01-25 (×2): qty 1

## 2015-01-25 MED ORDER — IOHEXOL 300 MG/ML  SOLN
100.0000 mL | Freq: Once | INTRAMUSCULAR | Status: AC | PRN
  Administered 2015-01-25: 100 mL via INTRAVENOUS

## 2015-01-25 MED ORDER — PANTOPRAZOLE SODIUM 40 MG PO TBEC
40.0000 mg | DELAYED_RELEASE_TABLET | Freq: Two times a day (BID) | ORAL | Status: DC
  Administered 2015-01-25 – 2015-01-26 (×2): 40 mg via ORAL
  Filled 2015-01-25 (×2): qty 1

## 2015-01-25 MED ORDER — ADULT MULTIVITAMIN W/MINERALS CH
1.0000 | ORAL_TABLET | Freq: Every day | ORAL | Status: DC
  Administered 2015-01-25 – 2015-01-26 (×2): 1 via ORAL
  Filled 2015-01-25 (×2): qty 1

## 2015-01-25 NOTE — Progress Notes (Signed)
          Daily Rounding Note  01/25/2015, 9:16 AM    SUBJECTIVE:       No stools or bleeding, no weakness.   Never had nausea, nor abd pain.  No chest pain, no SOB  OBJECTIVE:         Vital signs in last 24 hours:    Temp:  [97.4 F (36.3 C)-97.8 F (36.6 C)] 97.8 F (36.6 C) (09/03 0457) Pulse Rate:  [68-70] 70 (09/03 0457) Resp:  [18-20] 18 (09/03 0457) BP: (108-126)/(57-109) 108/57 mmHg (09/03 0457) SpO2:  [97 %-100 %] 97 % (09/03 0457) Weight:  [218 lb (98.884 kg)] 218 lb (98.884 kg) (09/02 1058) Last BM Date: 01/24/15 Filed Weights   01/24/15 1058  Weight: 218 lb (98.884 kg)   General: looks well   Heart: RRR Chest: clear bil  Abdomen: soft, NT, ND, active BS  Extremities: no CCE Neuro/Psych:  Oriented x 3.  No limb weakness.  Standing, walking without difficulty.   Intake/Output from previous day: 09/02 0701 - 09/03 0700 In: 222 [P.O.:222] Out: 1625 [Urine:1625]  Intake/Output this shift: Total I/O In: 300 [P.O.:300] Out: -   Lab Results:  Recent Labs  01/24/15 1310 01/25/15 0550  WBC 6.3 5.0  HGB 11.5* 10.4*  HCT 34.1* 30.1*  PLT 208 169   BMET No results for input(s): NA, K, CL, CO2, GLUCOSE, BUN, CREATININE, CALCIUM in the last 72 hours. LFT No results for input(s): PROT, ALBUMIN, AST, ALT, ALKPHOS, BILITOT, BILIDIR, IBILI in the last 72 hours. PT/INR No results for input(s): LABPROT, INR in the last 72 hours. Hepatitis Panel No results for input(s): HEPBSAG, HCVAB, HEPAIGM, HEPBIGM in the last 72 hours.  Studies/Results: No results found.  ASSESMENT:   *  Melena (black/red stool) starting 8/28, BRBPR, hgb drop from 15 to 13.1 to 11.5  in 8 days. Colon /EGD at Dr Youlanda Mighty office 9/2: multiple largfe gastric polyps noted, not removed.  Scattered sigmoid diverticula.  Source of bleeding not identified.  Capsule  Endo completed this AM, to be read today No bleeding since the colonoscopy.   *   Anemia.  Hgb dropped another 1 gram in less than 24 hours. Some of this may  Be dilutional as on IVF at 100/hour and taking clears   *  Hx A  Fib. Only on 81 ASA  *  Rheum arthritis. q Monday Enbrel.   *  Splenomegaly, limited liver vis on ultrasound 06/2014.    *  Obesity.     PLAN   *  Keep inpt till Hgb stable.  *  Do not see any coags, will obtain now. CBC in AM.  *  Stop IVF *  Advance diet.  * give meds,  Diltiazem, PPI according to home schedule. Start some of his home meds, supplements but hold off on starting 81 ASA.     Azucena Freed  01/25/2015, 9:16 AM Pager: [phone]

## 2015-01-26 ENCOUNTER — Encounter (HOSPITAL_COMMUNITY): Payer: Self-pay | Admitting: Gastroenterology

## 2015-01-26 DIAGNOSIS — K5731 Diverticulosis of large intestine without perforation or abscess with bleeding: Secondary | ICD-10-CM | POA: Diagnosis not present

## 2015-01-26 DIAGNOSIS — K8050 Calculus of bile duct without cholangitis or cholecystitis without obstruction: Secondary | ICD-10-CM

## 2015-01-26 DIAGNOSIS — D62 Acute posthemorrhagic anemia: Secondary | ICD-10-CM

## 2015-01-26 DIAGNOSIS — K5791 Diverticulosis of intestine, part unspecified, without perforation or abscess with bleeding: Secondary | ICD-10-CM

## 2015-01-26 HISTORY — DX: Acute posthemorrhagic anemia: D62

## 2015-01-26 LAB — CBC
HEMATOCRIT: 34 % — AB (ref 39.0–52.0)
HEMOGLOBIN: 11.2 g/dL — AB (ref 13.0–17.0)
MCH: 28.9 pg (ref 26.0–34.0)
MCHC: 32.9 g/dL (ref 30.0–36.0)
MCV: 87.9 fL (ref 78.0–100.0)
Platelets: 200 10*3/uL (ref 150–400)
RBC: 3.87 MIL/uL — AB (ref 4.22–5.81)
RDW: 14.9 % (ref 11.5–15.5)
WBC: 5.5 10*3/uL (ref 4.0–10.5)

## 2015-01-26 NOTE — Progress Notes (Signed)
Daily Rounding Note  01/26/2015, 9:54 AM    SUBJECTIVE:       Feels well, loose and dark stools after oral contrast yesterday.  Formed and less dark, more brown stool this AM.  Fells great, no weakness or dizziness.   OBJECTIVE:         Vital signs in last 24 hours:    Temp:  [97.5 F (36.4 C)-98.5 F (36.9 C)] 98.5 F (36.9 C) (09/04 0600) Pulse Rate:  [62-64] 64 (09/04 0600) Resp:  [12-20] 12 (09/04 0600) BP: (100-118)/(58-71) 100/58 mmHg (09/04 0600) SpO2:  [94 %-100 %] 94 % (09/04 0600) Last BM Date: 01/25/15 Filed Weights   01/24/15 1058  Weight: 218 lb (98.884 kg)   General: looks well   Heart: RRR Chest: clear bil.   Abdomen: soft, NT, ND.3  Extremities: no CCE Neuro/Psych:  Pleasant, alert, oriented x 3. No tremor.    Intake/Output from previous day: 09/03 0701 - 09/04 0700 In: 740 [P.O.:740] Out: 2700 [Urine:2700]  Intake/Output this shift:    Lab Results:  Recent Labs  01/24/15 1310 01/25/15 0550 01/26/15 0849  WBC 6.3 5.0 5.5  HGB 11.5* 10.4* 11.2*  HCT 34.1* 30.1* 34.0*  PLT 208 169 200   BMET  Recent Labs  01/25/15 1352  NA 137  K 3.4*  CL 106  CO2 27  GLUCOSE 126*  BUN 7  CREATININE 1.09  CALCIUM 8.1*   LFT No results for input(s): PROT, ALBUMIN, AST, ALT, ALKPHOS, BILITOT, BILIDIR, IBILI in the last 72 hours. PT/INR  Recent Labs  01/25/15 1024  LABPROT 15.3*  INR 1.19   Hepatitis Panel No results for input(s): HEPBSAG, HCVAB, HEPAIGM, HEPBIGM in the last 72 hours.  Studies/Results: Ct Abdomen Pelvis W Contrast  01/25/2015   CLINICAL DATA:  64 year old male with lower GI bleed. Video capsule endoscopy yesterday.  EXAM: CT ABDOMEN AND PELVIS WITH CONTRAST  TECHNIQUE: Multidetector CT imaging of the abdomen and pelvis was performed using the standard protocol following bolus administration of intravenous contrast.  CONTRAST:  154mL OMNIPAQUE IOHEXOL 300 MG/ML  SOLN   COMPARISON:  07/03/2014 ultrasound and 04/21/2011 CT  FINDINGS: Lower chest:  Unremarkable  Hepatobiliary: The liver is unremarkable. The patient is status post cholecystectomy. Mild fullness of the intrahepatic biliary system is again noted. Two separate 3 mm calcifications within the distal CBD are compatible with choledocholithiasis. There is no evidence of CBD or pancreatic ductal dilatation.  Pancreas: Unremarkable  Spleen: Unremarkable  Adrenals/Urinary Tract: Bilateral renal cortical thinning is identified. There is no evidence of hydronephrosis or obstructing urinary calculi. The adrenal glands and bladder are unremarkable.  Stomach/Bowel: A video endoscopy capsule is identified within the cecum. No definite focal bowel wall thickening is noted. There is no evidence of bowel obstruction. The appendix is normal.  Vascular/Lymphatic: No enlarged lymph nodes or abdominal aortic aneurysm.  Reproductive: Mild prostate enlargement noted.  Other: No free fluid, abscess or pneumoperitoneum.  Musculoskeletal: No acute or suspicious abnormalities.  IMPRESSION: Video endoscopy capsule identified within the cecum. Colonic diverticulosis without evidence of diverticulitis. No other definite bowel abnormalities identified to suggest a cause for this patient's GI bleed.  Distal CBD choledocholithiasis. No CBD dilatation or significant change of mild intrahepatic biliary fullness.  Bilateral renal cortical thinning.   Electronically Signed   By: Margarette Canada M.D.   On: 01/25/2015 16:06    ASSESMENT:    * Melena (black/red stool) starting 8/28, BRBPR, hgb  drop from 15 to 13.1 to 11.5 in 8 days. Colon /EGD at Dr Youlanda Mighty office 9/2: multiple large gastric polyps noted, not removed. Scattered sigmoid diverticula. Source of bleeding not identified.  CapsuleEndo 9/2 - 9/3: dark stool noted for 2 hours in SB as well as right colon. No clear source for the bleeding. ?ileal reflux of melenic stool from the right colon?  ?  Stuttering low-volume diverticular hemorrhage.  CT scan 9/3: capsule in cecum, colonic diverticulosis. Mild fullness of the intrahepatic biliary system is again noted. Two separate 3 mm calcifications within the distal CBD are compatible with choledocholithiasis. There is no evidence of CBD or pancreatic ductal dilatation.  No bleeding since the colonoscopy.   * Anemia.Improved after stopping IVF yesterday.  * Hx A Fib. Only on 81 ASA  * Rheum arthritis. q Monday Enbrel.   * Splenomegaly, limited liver vis on ultrasound 06/2014.   *  Choledocholithiasis.   * Obesity.     PLAN   *  Discharge today.  Follow up with Dr Collene Mares and Dr Benson Norway (for CBD stone).     William West  01/26/2015, 9:54 AM Pager: [phone]

## 2015-01-26 NOTE — Discharge Summary (Signed)
Discharge Summary:  Name: William West MRN: [id number] DOB: [birth year] 64 y.o. PCP:  Noralee Space, MD  Date of Admission: 01/24/2015  8:28 AM Date of Discharge: 01/26/2015 Attending Physician: No att. providers found  Admitting Dignosis: Active Problems:   GI bleeding   Acute blood loss anemia   Discharge Diagnosis: Active Problems:   GI bleeding   Acute blood loss anemia   Previous Medical/Surgical history Past Medical History  Diagnosis Date  . Allergic rhinitis   . Dermatitis   . Tobacco use disorder   . Hypercholesterolemia   . Diverticulosis of colon   . Mononucleosis   . Acute arthritis   . Dysplastic nevus   . A-fib   . RA (rheumatoid arthritis)   . Atrial fibrillation     history of a fib  . GERD (gastroesophageal reflux disease)   . GI bleeding 01/24/2015   Past Surgical History  Procedure Laterality Date  . Tonsillectomy and adenoidectomy  as a child  . L foot injury  age 32    w/ part amputation great toe  . Dysplastic nevus  07/1991    excised from upper back  . Cholecystectomy      laparoscopic  . Colonoscopy  01/24/2015  . Givens capsule study  01/24/2015    Brief History: Patient evaluated by Dr. Collene Mares on 01/23/15 in her office with 4 day history of melena and possibly bright red bleeding per rectum. His hemoglobin had dropped from 15 g on 01/16/15 to 13.1 on 01/23/2015.  On 9/2 she performed upper endoscopy and colonoscopy. There were multiple large gastric polyps noted but not removed and scattered sigmoid diverticula. There was some fresh blood in the colon but no blood in the stomach or proximal small bowel. He did state that during the bowel prep the previous night that he had passed quite a bit of blood.  She did not see a source for the bleeding.  Patient was  admitted for observation following the endoscopic procedures. and to undergo capsule endoscopy as well as to monitor his hemoglobin  Hospital Course by problem list:  Hematochezia:   Dr. Hilarie Fredrickson, covering for Dr. Collene Mares, read the capsule endoscopy. He noted dark stool in the small bowel as well as right colon. Wasn't sure whether this was ileal reflux of bloody stool from the right colon before due to a stuttering, low-volume, diverticular bleed.  Sure that nothing else was contributing, the patient underwent CT scan on 9/3 which showed the capsule in the cecum, confirmed colonic diverticulosis. Incidentally observed was some persistent mild fullness of the intrahepatic biliary system, 2 separate 3 mm calcifications in the distal CBD, consistent with choledocholithiasis but no dilatation of either pancreatic or common bile ducts.  In the evening, following the CT scan, the patient passed black stools, by that morning the stools were brown but still deep in color. There was no obvious blood in the stool and he was felt stable for discharge.  Patient was tolerating solid foods. He had no symptoms referral pole to anemia such as shortness of breath, chest pain, palpitations, weakness, dizziness. He  was discharged to home in stable condition. Patient was advised to avoid his usual 81 mg aspirin and to avoid NSAIDs, he was using some Advil prior to arrival. He also was advised to contact Dr. Lorie Apley office next week and to arrange for follow-up labs and/or visit per her recommendations.  Anemia due to blood loss: As noted above the baseline hemoglobin was 15.1 on 8/25. It is dropped to 13.1 on 9/1. It dropped to 10.4 on 9/3.  This drop was felt probably due to hydration from IV fluids. The following day, of discharge, the hemoglobin had climbed back to 11.2.  Choledocholithiasis. According to the CT scan there are some small distal CBD calcifications consistent with choledocholithiasis but this is not  associated with any dilation of the CBD or pancreatic duct. Patient is status post cholecystectomy. He has no symptoms referable to this CT scan finding.  LFTs had previously been obtained in 01/16/15 and were normal.       Wt Readings from Last 1 Encounters:  01/24/15 218 lb (98.884 kg)    Discharge Medications:   Medication List    STOP taking these medications        aspirin EC 81 MG tablet     ibuprofen 200 MG tablet  Commonly known as:  ADVIL,MOTRIN      TAKE these medications        diltiazem 120 MG 24 hr capsule  Commonly known as:  CARDIZEM CD  TAKE 1 CAPSULE (120 MG TOTAL) BY MOUTH DAILY.     esomeprazole 40 MG capsule  Commonly known as:  NEXIUM  Take 1 capsule (40 mg total) by mouth daily.     etanercept 50 MG/ML injection  Commonly known as:  ENBREL  Inject 50 mg into the skin once a week. On mondays     folic acid 563 MCG tablet  Commonly known as:  FOLVITE  Take 800 mcg by mouth daily.     LORazepam 1 MG tablet  Commonly known as:  ATIVAN  TAKE 1/2 TO 1 TABLET BY MOUTH THREE TIMES A DAY AS NEEDED     Magnesium 250 MG Tabs  Take 250 mg by mouth every other day.     multivitamin with minerals Tabs tablet  Take 1 tablet by mouth daily.     vitamin C 500 MG tablet  Commonly known as:  ASCORBIC ACID  Take 500 mg by mouth every morning. once daily with rose hips     Vitamin D3 2000 UNITS Chew  Chew 1 tablet by mouth daily.        Consultations:  None  Procedures Performed:  Capsule endoscopy 01/25/2015: Dark stool noted for 2 hours in SB as well as right colon. No clear source for the bleeding. ?ileal reflux of melenic stool from the right colon? ? Stuttering low-volume diverticular hemorrhage  Radiologic imaging:  Ct Abdomen Pelvis W Contrast 01/25/2015   CLINICAL DATA:  64 year old male with lower GI bleed. Video capsule endoscopy yesterday.    FINDINGS: Lower chest:  Unremarkable  Hepatobiliary: The liver is unremarkable. The patient is status  post cholecystectomy. Mild fullness of the intrahepatic biliary system is again noted. Two separate 3 mm calcifications within the distal CBD are compatible with choledocholithiasis. There is no evidence of CBD or pancreatic ductal dilatation.  Pancreas: Unremarkable  Spleen: Unremarkable  Adrenals/Urinary Tract: Bilateral renal cortical thinning is identified. There is no evidence of hydronephrosis or obstructing urinary calculi. The adrenal glands and bladder are unremarkable.  Stomach/Bowel: A video endoscopy capsule is identified within the cecum. No definite focal bowel wall thickening is noted. There is no evidence of bowel obstruction. The appendix is normal.  Vascular/Lymphatic: No enlarged lymph nodes or abdominal aortic aneurysm.  Reproductive: Mild prostate enlargement noted.  Other: No free fluid, abscess or pneumoperitoneum.  Musculoskeletal: No acute or suspicious abnormalities.  IMPRESSION: Video endoscopy capsule identified within the cecum. Colonic diverticulosis without evidence of diverticulitis. No other definite bowel abnormalities identified to suggest a cause for this patient's GI bleed.  Distal CBD choledocholithiasis. No CBD dilatation or significant change of mild intrahepatic biliary fullness.  Bilateral renal cortical thinning.   Electronically Signed   By: Margarette Canada M.D.   On: 01/25/2015 16:06   Discharge Labs:  Results for orders placed or performed during the hospital encounter of 01/24/15 (from the past 24 hour(s))  Basic metabolic panel     Status: Abnormal   Collection Time: 01/25/15  1:52 PM  Result Value Ref Range   Sodium 137 135 - 145 mmol/L   Potassium 3.4 (L) 3.5 - 5.1 mmol/L   Chloride 106 101 - 111 mmol/L   CO2 27 22 - 32 mmol/L   Glucose, Bld 126 (H) 65 - 99 mg/dL   BUN 7 6 - 20 mg/dL   Creatinine, Ser 1.09 0.61 - 1.24 mg/dL   Calcium 8.1 (L) 8.9 - 10.3 mg/dL   GFR calc non Af Amer >60 >60 mL/min   GFR calc Af Amer >60 >60 mL/min   Anion gap 4 (L) 5 - 15    CBC     Status: Abnormal   Collection Time: 01/26/15  8:49 AM  Result Value Ref Range   WBC 5.5 4.0 - 10.5 K/uL   RBC 3.87 (L) 4.22 - 5.81 MIL/uL   Hemoglobin 11.2 (L) 13.0 - 17.0 g/dL   HCT 34.0 (L) 39.0 - 52.0 %   MCV 87.9 78.0 - 100.0 fL   MCH 28.9 26.0 - 34.0 pg   MCHC 32.9 30.0 - 36.0 g/dL   RDW 14.9 11.5 - 15.5 %   Platelets 200 150 - 400 K/uL    Disposition and follow-up:   Mr.Jalil J Keckler was discharged from  in stable, excellent condition.  Follow up Appointments: Patient advised to call Dr. Lorie Apley office to arrange follow-up appointments and/or labs per her recommendation.  Discharge Instructions    Diet general    Complete by:  As directed      Discharge patient    Complete by:  As directed      Discontinue IV    Complete by:  As directed      Discontinue cardiac monitoring    Complete by:  As directed      Increase activity slowly    Complete by:  As directed             Diet at Discharge: Regular  Time Spent on discharge:  30 minutes   Signed: Mliss Sax Santina Evans  [phone] 01/26/2015, 1:41 PM

## 2015-01-26 NOTE — Discharge Instructions (Signed)
Call Dr Lorie Apley office for any questions, concerns.   Return to emergency room for significant weakness, dizziness, breathing problems, ongoing bloody or black stools that do not clear after nest 2 days.  Do not take aspirin and any advil, ibuprofen, aleve etc.  Dr Collene Mares can let you know when it will be safe to resume these.  Ok to use tylenol (acetominophen).

## 2015-01-26 NOTE — Progress Notes (Signed)
Pt left in wc w wife

## 2015-01-26 NOTE — Progress Notes (Signed)
PIV removed-pt tol well. Tele d/c. Disch instr, f/u appt given to pt-pt states understanding.

## 2015-02-04 ENCOUNTER — Other Ambulatory Visit: Payer: Self-pay | Admitting: Gastroenterology

## 2015-02-06 ENCOUNTER — Encounter (HOSPITAL_COMMUNITY): Payer: Self-pay | Admitting: *Deleted

## 2015-02-18 ENCOUNTER — Ambulatory Visit (HOSPITAL_COMMUNITY): Payer: BLUE CROSS/BLUE SHIELD

## 2015-02-18 ENCOUNTER — Ambulatory Visit (HOSPITAL_COMMUNITY)
Admission: RE | Admit: 2015-02-18 | Discharge: 2015-02-18 | Disposition: A | Discharge status: Home / Self Care | Payer: BLUE CROSS/BLUE SHIELD | Source: Ambulatory Visit | Attending: Gastroenterology | Admitting: Gastroenterology

## 2015-02-18 ENCOUNTER — Ambulatory Visit (HOSPITAL_COMMUNITY): Payer: BLUE CROSS/BLUE SHIELD | Admitting: Anesthesiology

## 2015-02-18 ENCOUNTER — Encounter (HOSPITAL_COMMUNITY): Payer: Self-pay

## 2015-02-18 ENCOUNTER — Encounter (HOSPITAL_COMMUNITY)
Admission: RE | Disposition: A | Discharge status: Home / Self Care | Payer: Self-pay | Source: Ambulatory Visit | Attending: Gastroenterology

## 2015-02-18 DIAGNOSIS — M1990 Unspecified osteoarthritis, unspecified site: Secondary | ICD-10-CM | POA: Insufficient documentation

## 2015-02-18 DIAGNOSIS — K8050 Calculus of bile duct without cholangitis or cholecystitis without obstruction: Secondary | ICD-10-CM | POA: Insufficient documentation

## 2015-02-18 DIAGNOSIS — Z79899 Other long term (current) drug therapy: Secondary | ICD-10-CM | POA: Diagnosis not present

## 2015-02-18 DIAGNOSIS — K219 Gastro-esophageal reflux disease without esophagitis: Secondary | ICD-10-CM | POA: Diagnosis not present

## 2015-02-18 DIAGNOSIS — Z87891 Personal history of nicotine dependence: Secondary | ICD-10-CM | POA: Diagnosis not present

## 2015-02-18 DIAGNOSIS — M069 Rheumatoid arthritis, unspecified: Secondary | ICD-10-CM | POA: Diagnosis not present

## 2015-02-18 DIAGNOSIS — Z86010 Personal history of colonic polyps: Secondary | ICD-10-CM | POA: Insufficient documentation

## 2015-02-18 DIAGNOSIS — K5730 Diverticulosis of large intestine without perforation or abscess without bleeding: Secondary | ICD-10-CM | POA: Insufficient documentation

## 2015-02-18 DIAGNOSIS — I4891 Unspecified atrial fibrillation: Secondary | ICD-10-CM | POA: Diagnosis not present

## 2015-02-18 HISTORY — PX: ERCP: SHX5425

## 2015-02-18 SURGERY — ERCP, WITH INTERVENTION IF INDICATED
Anesthesia: General

## 2015-02-18 MED ORDER — LIDOCAINE HCL (CARDIAC) 20 MG/ML IV SOLN
INTRAVENOUS | Status: DC | PRN
  Administered 2015-02-18: 50 mg via INTRAVENOUS

## 2015-02-18 MED ORDER — MIDAZOLAM HCL 5 MG/5ML IJ SOLN
INTRAMUSCULAR | Status: DC | PRN
  Administered 2015-02-18: 2 mg via INTRAVENOUS

## 2015-02-18 MED ORDER — CIPROFLOXACIN IN D5W 400 MG/200ML IV SOLN
INTRAVENOUS | Status: AC
  Filled 2015-02-18: qty 200

## 2015-02-18 MED ORDER — ROCURONIUM BROMIDE 100 MG/10ML IV SOLN
INTRAVENOUS | Status: AC
  Filled 2015-02-18: qty 1

## 2015-02-18 MED ORDER — SODIUM CHLORIDE 0.9 % IV SOLN: INTRAVENOUS | Status: DC

## 2015-02-18 MED ORDER — INDOMETHACIN 50 MG RE SUPP
100.0000 mg | Freq: Once | RECTAL | Status: AC
  Administered 2015-02-18: 100 mg via RECTAL

## 2015-02-18 MED ORDER — INDOMETHACIN 50 MG RE SUPP
RECTAL | Status: AC
  Filled 2015-02-18: qty 2

## 2015-02-18 MED ORDER — ONDANSETRON HCL 4 MG/2ML IJ SOLN
INTRAMUSCULAR | Status: AC
  Filled 2015-02-18: qty 2

## 2015-02-18 MED ORDER — SODIUM CHLORIDE 0.9 % IV SOLN
INTRAVENOUS | Status: DC | PRN
  Administered 2015-02-18: 40 mL

## 2015-02-18 MED ORDER — LACTATED RINGERS IV SOLN
INTRAVENOUS | Status: DC
Start: 2015-02-18 — End: 2015-02-18
  Administered 2015-02-18: 1000 mL via INTRAVENOUS

## 2015-02-18 MED ORDER — LIDOCAINE HCL (CARDIAC) 20 MG/ML IV SOLN
INTRAVENOUS | Status: AC
  Filled 2015-02-18: qty 5

## 2015-02-18 MED ORDER — MIDAZOLAM HCL 2 MG/2ML IJ SOLN
INTRAMUSCULAR | Status: AC
  Filled 2015-02-18: qty 4

## 2015-02-18 MED ORDER — PROPOFOL 10 MG/ML IV BOLUS
INTRAVENOUS | Status: AC
  Filled 2015-02-18: qty 20

## 2015-02-18 MED ORDER — SUCCINYLCHOLINE CHLORIDE 20 MG/ML IJ SOLN
INTRAMUSCULAR | Status: DC | PRN
  Administered 2015-02-18: 100 mg via INTRAVENOUS

## 2015-02-18 MED ORDER — CIPROFLOXACIN IN D5W 400 MG/200ML IV SOLN
INTRAVENOUS | Status: DC | PRN
  Administered 2015-02-18: 400 mg via INTRAVENOUS

## 2015-02-18 MED ORDER — FENTANYL CITRATE (PF) 100 MCG/2ML IJ SOLN
INTRAMUSCULAR | Status: AC
  Filled 2015-02-18: qty 4

## 2015-02-18 MED ORDER — PROPOFOL 10 MG/ML IV BOLUS
INTRAVENOUS | Status: DC | PRN
  Administered 2015-02-18: 180 mg via INTRAVENOUS

## 2015-02-18 MED ORDER — FENTANYL CITRATE (PF) 100 MCG/2ML IJ SOLN
INTRAMUSCULAR | Status: DC | PRN
  Administered 2015-02-18 (×2): 50 ug via INTRAVENOUS

## 2015-02-18 NOTE — Anesthesia Preprocedure Evaluation (Signed)
Anesthesia Evaluation  Patient identified by MRN, date of birth, ID band Patient awake    Reviewed: Allergy & Precautions, NPO status , Patient's Chart, lab work & pertinent test results  Airway Mallampati: II  TM Distance: >3 FB Neck ROM: Full    Dental   Pulmonary former smoker,    breath sounds clear to auscultation       Cardiovascular + dysrhythmias Atrial Fibrillation  Rhythm:Regular Rate:Normal     Neuro/Psych    GI/Hepatic GERD  ,History noted. CE  GI history noted. CE   Endo/Other  negative endocrine ROS  Renal/GU      Musculoskeletal  (+) Arthritis , Rheumatoid disorders,    Abdominal   Peds  Hematology   Anesthesia Other Findings   Reproductive/Obstetrics                             Anesthesia Physical Anesthesia Plan  ASA: III  Anesthesia Plan: General   Post-op Pain Management:    Induction: Intravenous  Airway Management Planned: Oral ETT  Additional Equipment:   Intra-op Plan:   Post-operative Plan: Extubation in OR  Informed Consent: I have reviewed the patients History and Physical, chart, labs and discussed the procedure including the risks, benefits and alternatives for the proposed anesthesia with the patient or authorized representative who has indicated his/her understanding and acceptance.   Dental advisory given  Plan Discussed with: CRNA and Anesthesiologist  Anesthesia Plan Comments:         Anesthesia Quick Evaluation

## 2015-02-18 NOTE — Discharge Instructions (Signed)
Endoscopic Retrograde Cholangiopancreatography (ERCP), Care After °Refer to this sheet in the next few weeks. These instructions provide you with information on caring for yourself after your procedure. Your health care provider may also give you more specific instructions. Your treatment has been planned according to current medical practices, but problems sometimes occur. Call your health care provider if you have any problems or questions after your procedure.  °WHAT TO EXPECT AFTER THE PROCEDURE  °After your procedure, it is typical to feel:  °· Soreness in your throat.   °· Sick to your stomach (nauseous).   °· Bloated. °· Dizzy.   °· Fatigued. °HOME CARE INSTRUCTIONS °· Have a friend or family member stay with you for the first 24 hours after your procedure. °· Start taking your usual medicines and eating normally as soon as you feel well enough to do so or as directed by your health care provider. °SEEK MEDICAL CARE IF: °· You have abdominal pain.   °· You develop signs of infection, such as:   °¨ Chills.   °¨ Feeling unwell.   °SEEK IMMEDIATE MEDICAL CARE IF: °· You have difficulty swallowing. °· You have worsening throat, chest, or abdominal pain. °· You vomit. °· You have bloody or very black stools. °· You have a fever. °Document Released: 02/28/2013 Document Reviewed: 02/28/2013 °ExitCare® Patient Information ©2015 [employer]. This information is not intended to replace advice given to you by your health care provider. Make sure you discuss any questions you have with your health care provider. ° °

## 2015-02-18 NOTE — Op Note (Signed)
[facility] [place], [address]   ERCP PROCEDURE REPORT        EXAM DATE: Mar 13, 2015  PATIENT NAME:          William West, William West          MR #: [id number] BIRTHDATE:       [birth year]     VISIT #:     [phone] ATTENDING:     Carol Ada, MD     STATUS:     outpatient ASSISTANT:      Cletis [place], [place], [place], and [place], [place]   INDICATIONS:  The patient is a 64 yr old male here for an ERCP due to choledocholithiasis. PROCEDURE PERFORMED:     ERCP with removal of calculus/calculi  MEDICATIONS:     General anesthesia and post procedure Indomethacin 100 mg PR.  CONSENT: The patient understands the risks and benefits of the procedure and understands that these risks include, but are not limited to: sedation, allergic reaction, infection, perforation and/or bleeding. Alternative means of evaluation and treatment include, among others: physical exam, x-rays, and/or surgical intervention. The patient elects to proceed with this endoscopic procedure.  DESCRIPTION OF PROCEDURE: During intra-op preparation period all mechanical & medical equipment was checked for proper function. Hand hygiene and appropriate measures for infection prevention was taken. After the risks, benefits and alternatives of the procedure were thoroughly explained, Informed was verified, confirmed and timeout was successfully executed by the treatment team. With the patient in left semi-prone position, medications were administered intravenously.The    was passed from the mouth into the esophagus and further advanced from the esophagus into the stomach. From stomach scope was directed to the second portion of the duodenum. Major papilla was aligned with the duodenoscope. The scope position was confirmed fluoroscopically. Rest of the findings/therapeutics are given below. The scope was then completely withdrawn from the patient and the procedure completed. The pulse,  BP, and O2 saturation were monitored and documented by the physician and the nursing staff throughout the entire procedure. The patient was cared for as planned according to standard protocol. The patient was then discharged to recovery in stable condition and with appropriate post procedure care. Estimated blood loss is zero unless otherwise noted in this procedure report.  The ampulla was located the second portion of the duodenum and there was some scant drainage of bile.  The CBD was easily cannulated during the first attempt.  The guidewire was secured in the right intrahepatic ducts.  Contrast injection revealed a dilated CBD at 1 cm and dilated distal intrahepatic ducts.  A single radiolucency was found in the distal CBD.  An 8 mm sphincterotomy was created. The ampulla was flat and I did not feel comfortable with increasing the sphincterotomy.  The CBD was swept multiple times and two black stones as well as stone fragments were removed.  An occlusion cholangiogram was performed and there was no evidence of any retained stones.    ADVERSE EVENT:     No immediate. IMPRESSIONS:     1) Choledocholithiasis s/p successful stone extraction.  RECOMMENDATIONS:     1) Follow up with Dr. Collene Mares in 4 weeks.  REPEAT EXAM:   ___________________________________ Carol Ada, MD eSigned:  Carol Ada, MD 03-13-15 1:43 PM   cc:  CPT CODES: ICD9 CODES:

## 2015-02-18 NOTE — Anesthesia Postprocedure Evaluation (Signed)
  Anesthesia Post-op Note  Patient: William West  Procedure(s) Performed: Procedure(s): ENDOSCOPIC RETROGRADE CHOLANGIOPANCREATOGRAPHY (ERCP) (N/A)  Patient Location: PACU  Anesthesia Type:General  Level of Consciousness: awake  Airway and Oxygen Therapy: Patient Spontanous Breathing  Post-op Pain: mild  Post-op Assessment: Post-op Vital signs reviewed              Post-op Vital Signs: Reviewed  Last Vitals:  Filed Vitals:   02/18/15 1425  BP:   Pulse: 54  Temp:   Resp: 9    Complications: No apparent anesthesia complications

## 2015-02-18 NOTE — Anesthesia Procedure Notes (Signed)
Procedure Name: Intubation Date/Time: 02/18/2015 12:55 PM Performed by: Noralyn Pick D Pre-anesthesia Checklist: Patient identified, Emergency Drugs available, Suction available and Patient being monitored Patient Re-evaluated:Patient Re-evaluated prior to inductionOxygen Delivery Method: Circle System Utilized Preoxygenation: Pre-oxygenation with 100% oxygen Intubation Type: IV induction Ventilation: Mask ventilation without difficulty Laryngoscope Size: Mac and 4 Grade View: Grade II Tube type: Oral Tube size: 7.5 mm Number of attempts: 1 Airway Equipment and Method: Stylet and Oral airway Placement Confirmation: ETT inserted through vocal cords under direct vision,  positive ETCO2 and breath sounds checked- equal and bilateral Secured at: 22 cm Tube secured with: Tape Dental Injury: Teeth and Oropharynx as per pre-operative assessment

## 2015-02-18 NOTE — H&P (View-Only) (Signed)
Pt left in wc w wife

## 2015-02-18 NOTE — Transfer of Care (Signed)
Immediate Anesthesia Transfer of Care Note  Patient: William West  Procedure(s) Performed: Procedure(s): ENDOSCOPIC RETROGRADE CHOLANGIOPANCREATOGRAPHY (ERCP) (N/A)  Patient Location: PACU  Anesthesia Type:General  Level of Consciousness: awake, alert  and oriented  Airway & Oxygen Therapy: Patient Spontanous Breathing and Patient connected to face mask oxygen  Post-op Assessment: Report given to RN and Post -op Vital signs reviewed and stable  Post vital signs: Reviewed and stable  Last Vitals:  Filed Vitals:   02/18/15 1151  BP: 132/73  Pulse: 58  Temp: 36.8 C  Resp: 12    Complications: No apparent anesthesia complications

## 2015-02-18 NOTE — Interval H&P Note (Signed)
History and Physical Interval Note:  02/18/2015 12:42 PM  William West  has presented today for surgery, with the diagnosis of stone in CBD  The various methods of treatment have been discussed with the patient and family. After consideration of risks, benefits and other options for treatment, the patient has consented to  Procedure(s): ENDOSCOPIC RETROGRADE CHOLANGIOPANCREATOGRAPHY (ERCP) (N/A) as a surgical intervention .  The patient's history has been reviewed, patient examined, no change in status, stable for surgery.  I have reviewed the patient's chart and labs.  Questions were answered to the patient's satisfaction.     HUNG,PATRICK D

## 2015-02-20 ENCOUNTER — Encounter (HOSPITAL_COMMUNITY): Payer: Self-pay | Admitting: Gastroenterology

## 2015-03-07 ENCOUNTER — Ambulatory Visit: Payer: BLUE CROSS/BLUE SHIELD

## 2015-03-13 ENCOUNTER — Encounter: Payer: Self-pay | Admitting: Pulmonary Disease

## 2015-03-21 ENCOUNTER — Other Ambulatory Visit: Payer: Self-pay | Admitting: Internal Medicine

## 2015-04-07 ENCOUNTER — Encounter: Payer: Self-pay | Admitting: Internal Medicine

## 2015-04-07 ENCOUNTER — Ambulatory Visit (INDEPENDENT_AMBULATORY_CARE_PROVIDER_SITE_OTHER): Payer: BLUE CROSS/BLUE SHIELD | Admitting: Internal Medicine

## 2015-04-07 VITALS — BP 118/74 | HR 83 | Ht 71.5 in | Wt 228.7 lb

## 2015-04-07 DIAGNOSIS — E7800 Pure hypercholesterolemia, unspecified: Secondary | ICD-10-CM | POA: Diagnosis not present

## 2015-04-07 DIAGNOSIS — K5731 Diverticulosis of large intestine without perforation or abscess with bleeding: Secondary | ICD-10-CM | POA: Diagnosis not present

## 2015-04-07 DIAGNOSIS — I4891 Unspecified atrial fibrillation: Secondary | ICD-10-CM

## 2015-04-07 NOTE — Progress Notes (Signed)
HPI [name] returns today for followup. He is a pleasant 64 yo man with PAF, HTN, and arthritis. In the interim he has done well utilizing a strategy of a pill in the pocket for control of his atrial fibrillation. He denies chest pain or sob. He has had rare palpitations and no hospitalizations for atrial fibrillation. He has not taken his flecainide as his atrial fib resolves within and hour or two with diltiazem. Previously he had heart racing on Flecainide. Allergies  Allergen Reactions  . Caffeine Other (See Comments)    Causes him to go into afib  . Flecainide Other (See Comments)    Caused heart to race too fast and had him in the ED  . Methotrexate Derivatives Nausea And Vomiting    Increased liver enzymes  . Pseudoephedrine-Ibuprofen Other (See Comments)    " afib"   . Penicillins Rash    .Marland KitchenHas patient had a PCN reaction causing immediate rash, facial/tongue/throat swelling, SOB or lightheadedness with hypotension: No, YES TO RASH  Has patient had a PCN reaction causing severe rash involving mucus membranes or skin necrosis: No Has patient had a PCN reaction that required hospitalization No Has patient had a PCN reaction occurring within the last 10 years: No If all of the above answers are "NO", then may proceed with Cephalosporin use.     Current Outpatient Prescriptions  Medication Sig Dispense Refill  . Ascorbic Acid (VITAMIN C) 500 MG tablet Take 500 mg by mouth every morning. once daily with rose hips    . Cholecalciferol (VITAMIN D3) 2000 UNITS CHEW Chew 1 tablet by mouth daily.    [name] diltiazem (CARDIZEM CD) 120 MG 24 hr capsule TAKE 1 CAPSULE (120 MG TOTAL) BY MOUTH DAILY. 90 capsule 1  . esomeprazole (NEXIUM) 40 MG capsule Take 1 capsule (40 mg total) by mouth daily. (Patient taking differently: Take 40 mg by mouth daily. Around 3 pm) 90 capsule 3  . etanercept (ENBREL) 50 MG/ML injection Inject 50 mg into the skin once a week. On mondays    . folic acid (FOLVITE)  [id number] MCG tablet Take 800 mcg by mouth daily.      [name] LORazepam (ATIVAN) 1 MG tablet TAKE 1/2 TO 1 TABLET BY MOUTH THREE TIMES A DAY AS NEEDED (Patient taking differently: TAKE 1/2 TO 1 TABLET BY MOUTH THREE TIMES A DAY AS NEEDED FOR SLEEP) 90 tablet 5  . Magnesium 250 MG TABS Take 250 mg by mouth every other day.    . Multiple Vitamin (MULTIVITAMIN WITH MINERALS) TABS tablet Take 1 tablet by mouth daily.     No current facility-administered medications for this visit.     Past Medical History  Diagnosis Date  . Allergic rhinitis   . Dermatitis   . Tobacco use disorder   . Hypercholesterolemia   . Diverticulosis of colon   . Mononucleosis   . Acute arthritis   . Dysplastic nevus   . A-fib ([place])   . RA (rheumatoid arthritis) ([place])   . Atrial fibrillation ([place])     history of a fib  . GERD (gastroesophageal reflux disease)   . GI bleeding 01/24/2015    ROS:   All systems reviewed and negative except as noted in the HPI.   Past Surgical History  Procedure Laterality Date  . Tonsillectomy and adenoidectomy  as a child  . L foot injury  age 26    w/ part amputation great toe  . Dysplastic nevus  07/1991  excised from upper back  . Colonoscopy  01/24/2015    x 3 in past  . Givens capsule study  01/24/2015  . Givens capsule study N/A 01/24/2015    Procedure: GIVENS CAPSULE STUDY;  Surgeon: Juanita Craver, MD;  Location: [facility] ENDOSCOPY;  Service: Endoscopy;  Laterality: N/A;  . Cholecystectomy  2011    laparoscopic  . Ercp N/A 02/18/2015    Procedure: ENDOSCOPIC RETROGRADE CHOLANGIOPANCREATOGRAPHY (ERCP);  Surgeon: Carol Ada, MD;  Location: [facility] ENDOSCOPY;  Service: Endoscopy;  Laterality: N/A;     Family History  Problem Relation Age of Onset  . Prostate cancer Father   . Other Father     pacemaker  . Heart disease Mother   . Other Mother     vascular disease     Social History   Social History  . Marital Status: Married    Spouse Name: William West x 38 yrs  . Number of  Children: 2  . Years of Education: N/A   Occupational History  . [employer]    Social History Main Topics  . Smoking status: Former Smoker -- 0.50 packs/day for 45 years    Types: Cigarettes    Quit date: 11/13/2009  . Smokeless tobacco: Never Used  . Alcohol Use: No  . Drug Use: No  . Sexual Activity: No   Other Topics Concern  . Not on file   Social History Narrative   One son with HBP   One son with IDDM      May return soon     BP 118/74 mmHg  Pulse 83  Ht 5' 11.5" (1.816 m)  Wt 228 lb 11.2 oz (103.738 kg)  BMI 31.46 kg/m2  SpO2 96%  Physical Exam:  Well appearing middle aged man, NAD HEENT: Unremarkable Neck:  6 cm JVD, no thyromegally Back:  No CVA tenderness Lungs:  Clear with no wheezes, rales, or rhonchi HEART:  Regular rate rhythm, no murmurs, no rubs, no clicks Abd:  soft, positive bowel sounds, no organomegally, no rebound, no guarding Ext:  2 plus pulses, no edema, no cyanosis, no clubbing Skin:  No rashes no nodules Neuro:  CN II through XII intact, motor grossly intact  EKG - nsr    Assess/Plan:

## 2015-04-07 NOTE — Assessment & Plan Note (Signed)
He has gone 2 months since he had any bleeding. At this point he will undergo watchful waiting.

## 2015-04-07 NOTE — Patient Instructions (Signed)
Your physician wants you to follow-up in: 1 Year with Dr. Taylor. You will receive a reminder letter in the mail two months in advance. If you don't receive a letter, please call our office to schedule the follow-up appointment.  Your physician recommends that you continue on your current medications as directed. Please refer to the Current Medication list given to you today.  Thank you for choosing [facility]!   

## 2015-04-07 NOTE — Assessment & Plan Note (Signed)
He will maintain a low fat diet. 

## 2015-04-07 NOTE — Assessment & Plan Note (Signed)
His ventricular rate is well controlled. He will continue his current meds. He stopped his ASA because of diverticular bleeding.

## 2015-04-24 ENCOUNTER — Encounter: Payer: Self-pay | Admitting: Dermatology

## 2015-04-28 ENCOUNTER — Encounter: Payer: Self-pay | Admitting: Pulmonary Disease

## 2015-05-23 ENCOUNTER — Telehealth: Payer: Self-pay | Admitting: Pulmonary Disease

## 2015-05-23 NOTE — Telephone Encounter (Signed)
Patient says that someone called and left a message for him to call back. He says that he did not get a name, but they said they needed to update information in his chart.  Kathlee Nations - did you call this patient?

## 2015-05-27 NOTE — Telephone Encounter (Signed)
Sharyn Lull,  I didn't call this patient, and he does not have any charges pending in our work queues, so I don't think Chantel would have called him either.  Kathlee Nations

## 2015-05-27 NOTE — Telephone Encounter (Signed)
Not sure who tried to contact this patient. Patient is aware that we are not sure who tried to reach him. Nothing further needed.

## 2015-08-01 ENCOUNTER — Telehealth: Payer: Self-pay | Admitting: Pulmonary Disease

## 2015-08-01 DIAGNOSIS — R361 Hematospermia: Secondary | ICD-10-CM

## 2015-08-01 NOTE — Telephone Encounter (Signed)
[phone] calling back

## 2015-08-01 NOTE — Telephone Encounter (Signed)
°  Pt returning call and can e reached at home or on cell home # is [phone].Hillery Hunter

## 2015-08-01 NOTE — Telephone Encounter (Signed)
Referral has been placed. Called made pt aware. Nothing further needed

## 2015-08-01 NOTE — Telephone Encounter (Signed)
Patient states that he has noticed blood in his semen this week.  He says that his semen is a light pink color. No pain, no trouble with urination.  No trouble emptying bladder, no lumps.   Patient is requesting referral to Urologist to have this checked out.    Dr. Lenna Gilford, please advise

## 2015-08-01 NOTE — Telephone Encounter (Signed)
Per SN:  Ok to refer to Urology.   LMTCB for pt.

## 2015-09-03 ENCOUNTER — Ambulatory Visit (INDEPENDENT_AMBULATORY_CARE_PROVIDER_SITE_OTHER): Payer: BLUE CROSS/BLUE SHIELD | Admitting: Urology

## 2015-09-03 DIAGNOSIS — R361 Hematospermia: Secondary | ICD-10-CM

## 2015-09-12 ENCOUNTER — Other Ambulatory Visit: Payer: Self-pay | Admitting: Pulmonary Disease

## 2015-09-12 ENCOUNTER — Other Ambulatory Visit: Payer: Self-pay | Admitting: Internal Medicine

## 2015-09-15 ENCOUNTER — Other Ambulatory Visit: Payer: Self-pay | Admitting: Pulmonary Disease

## 2015-12-11 ENCOUNTER — Other Ambulatory Visit: Payer: Self-pay | Admitting: Pulmonary Disease

## 2015-12-24 ENCOUNTER — Other Ambulatory Visit (INDEPENDENT_AMBULATORY_CARE_PROVIDER_SITE_OTHER): Payer: BLUE CROSS/BLUE SHIELD

## 2015-12-24 ENCOUNTER — Ambulatory Visit (INDEPENDENT_AMBULATORY_CARE_PROVIDER_SITE_OTHER)
Admission: RE | Admit: 2015-12-24 | Discharge: 2015-12-24 | Disposition: A | Discharge status: Home / Self Care | Payer: BLUE CROSS/BLUE SHIELD | Source: Ambulatory Visit | Attending: Pulmonary Disease | Admitting: Pulmonary Disease

## 2015-12-24 ENCOUNTER — Ambulatory Visit (INDEPENDENT_AMBULATORY_CARE_PROVIDER_SITE_OTHER): Payer: BLUE CROSS/BLUE SHIELD | Admitting: Pulmonary Disease

## 2015-12-24 ENCOUNTER — Encounter: Payer: Self-pay | Admitting: Pulmonary Disease

## 2015-12-24 VITALS — BP 120/82 | HR 82 | Temp 98.2°F | Ht 72.0 in | Wt 227.1 lb

## 2015-12-24 DIAGNOSIS — I480 Paroxysmal atrial fibrillation: Secondary | ICD-10-CM

## 2015-12-24 DIAGNOSIS — Z87891 Personal history of nicotine dependence: Secondary | ICD-10-CM | POA: Diagnosis not present

## 2015-12-24 DIAGNOSIS — J209 Acute bronchitis, unspecified: Secondary | ICD-10-CM

## 2015-12-24 DIAGNOSIS — Z0000 Encounter for general adult medical examination without abnormal findings: Secondary | ICD-10-CM

## 2015-12-24 LAB — CBC WITH DIFFERENTIAL/PLATELET
Basophils Absolute: 0 10*3/uL (ref 0.0–0.1)
Basophils Relative: 0.4 % (ref 0.0–3.0)
EOS ABS: 0.1 10*3/uL (ref 0.0–0.7)
EOS PCT: 0.9 % (ref 0.0–5.0)
HCT: 38.1 % — ABNORMAL LOW (ref 39.0–52.0)
Hemoglobin: 13.1 g/dL (ref 13.0–17.0)
LYMPHS ABS: 1.7 10*3/uL (ref 0.7–4.0)
Lymphocytes Relative: 14.1 % (ref 12.0–46.0)
MCHC: 34.5 g/dL (ref 30.0–36.0)
MCV: 84.3 fl (ref 78.0–100.0)
MONO ABS: 1.4 10*3/uL — AB (ref 0.1–1.0)
Monocytes Relative: 11.8 % (ref 3.0–12.0)
NEUTROS PCT: 72.8 % (ref 43.0–77.0)
Neutro Abs: 8.7 10*3/uL — ABNORMAL HIGH (ref 1.4–7.7)
Platelets: 249 10*3/uL (ref 150.0–400.0)
RBC: 4.52 Mil/uL (ref 4.22–5.81)
RDW: 15 % (ref 11.5–15.5)
WBC: 11.9 10*3/uL — AB (ref 4.0–10.5)

## 2015-12-24 LAB — COMPREHENSIVE METABOLIC PANEL
ALK PHOS: 59 U/L (ref 39–117)
ALT: 14 U/L (ref 0–53)
AST: 16 U/L (ref 0–37)
Albumin: 4 g/dL (ref 3.5–5.2)
BUN: 14 mg/dL (ref 6–23)
CHLORIDE: 106 meq/L (ref 96–112)
CO2: 25 mEq/L (ref 19–32)
CREATININE: 1.07 mg/dL (ref 0.40–1.50)
Calcium: 9.4 mg/dL (ref 8.4–10.5)
GFR: 73.84 mL/min (ref >60.00)
GLUCOSE: 105 mg/dL — AB (ref 70–99)
POTASSIUM: 4.1 meq/L (ref 3.5–5.1)
SODIUM: 139 meq/L (ref 135–145)
TOTAL PROTEIN: 7.1 g/dL (ref 6.0–8.3)
Total Bilirubin: 0.8 mg/dL (ref 0.2–1.2)

## 2015-12-24 LAB — TSH: TSH: 1.93 u[IU]/mL (ref 0.35–4.50)

## 2015-12-24 MED ORDER — LEVOFLOXACIN 500 MG PO TABS: 500.0000 mg | ORAL_TABLET | Freq: Every day | ORAL | 0 refills | Status: DC

## 2015-12-24 MED ORDER — METHYLPREDNISOLONE ACETATE 80 MG/ML IJ SUSP
120.0000 mg | Freq: Once | INTRAMUSCULAR | Status: AC
  Administered 2015-12-24: 120 mg via INTRAMUSCULAR

## 2015-12-24 NOTE — Progress Notes (Signed)
Subjective:    Patient ID: William West, male    DOB: 1950/06/07, 65 y.o.   MRN: [id number]  HPI 65 y/o WM, husb of William West, here for a follow up visit & CPX... ~  SEE PREV EPIC NOTES FOR OLDER DATA >>    CXR 8/13 showed normal heart size, clear lungs, low lung vols & sl elev of right hemidiaph, s/p GB...  EKG 8/13 showed NSR, rate62, wnl, NAD...  LABS 8/13:  FLP- ok x LDL=120 on diet alone;  Chems- wnl;  CBC- wnl;  TSH=1.81;  PSA=0.62   LABS 8/14:  FLP- at goals on diet x LDL=109;  Chems- wnl;  CBC- wnl;  TSH=1.90;  PSA=0.67...  ~  January 14, 2014:  Yearly ROV & CPX> Leonides reports a good yr overall; he continues to see [name] for RA on Enbrel & MTX every 3-34mofor labs and OVs (we do not have recent notes from them);  He notes sl cough w/ greenish sput in the AMs but no f/c/s, chest discomfort, or SOB; we discussed ZPak Mucinex, Fluids for prn use;  He also had a bout of diarrhea 6/15=> went to ER w/ neg stoll studies, treated empirically w/ Cipro/Flagyl & symptoms resolved...     Ex-smoker> he quit in 2011 & has remained off cigs; breathing is good he says, no issues; exerc w/ yard work, splits wood, etc; CXR is clear...    PAF> on Diltiazem120 & Flecainide prn per [name] "pill in the pocket" regimen (see below); he has used this med on only one occas during the last yr he says...    Chol> on diet alone & wt=230# (unchanged); FLP is stable w/ TChol 161, TG 117, HDL 37, LDL 100; we reviewed diet & wt reduction strategies...    Dyspepsia> on Prilosec 261md & this helps to control his symptoms; he saw [name] 11/12 at [facility] for RUQ pain after lifting- CT was neg & the pain was felt to be musculoskeletal pain; s/p lap chole 7/11 for stones...    [facility] 2011 was neg & f/u should be 7-1044yr.    RA> followed by DrBGlean Salenery 3-68mo468moEnbrel & MTX (currently 4/wk)- this has really helped & doing very well on Rx; we do not have notes or labs  from Rheum...    Hx dysplastic nevus, fair skinned/ red hair> advised to get Derm eval at least yearly & he promises to do so... We reviewed prob list, meds, xrays and labs> see below for updates >>   CXR 8/15 showed norm heart size, clear lungs, NAD...  LABS 6/15 via ER>  Chems- wnl;  CBC- wnl   LABS 8/15:  FLP- ok on diet alone;  TSH= 1.86;  PSA= 0.87   ~  March 05, 2014:  6wk ROV & add-on appt requested for 5d hx pain, tenderness, discomfort in suprapubic area but denies dysuria, hematuria, foul smelling urine, etc;  He notes "pressure" but no burning, no flankl or back pain, etc and he notes urine stream appears ok w/ good force;  Initially he felt chilled, shakey, but didn't take temp- he notes that tylenol helped;  States he had similar episode 1-2 yrs ago & symptoms resolved w/ meds called in for him at that time...  Exam is essentially neg- except min tender over bladder, neg CVAT, prostate is 2+ and normal, stool heme neg... We decided to check UA (clear), and Labs (all wnl- chems, CBC, Sed); and  cover empirically w/ Fluids and cranberry juice, ok to continue Tylenol, we are holding antibiotics until C&S returns... We reviewed prob list, meds, xrays and labs> see below for updates >>   LABS 10/15:  Chems- wnl w/ Cr=1.1;  CBC- wnl w/ Hg=13.9 & WBC=7.7;  Sed=17;  UA- clear w/ C&S pending...   ~  July 02, 2014:  58moROV & add-on appt requested for Abd Pain> states this is going on for several months and seems progressive- now getting "attacks" 3 times per week; states the discomfort is similar to prev GB attacks (he had GB removed by [name] in 2011); c/o lots of indigestion- notes some nausea & vomits on occas then feels better; says appetite is fair, eats ok w/o swallowing issues, then 30 mon later "it sits on my stomach"; he has noted white stool yest & states his urine has been darker; he has been on OTC Omeprazole20 w/o help he says... NOTE> he had recheck by [name] for abd  pain 145yrfter GB surg- they did CT Abd and it was NEG (prev cholecystectomy, sm hepatic dome cyst, diverticulosis, NAD)...Marland KitchenMarland Kitchen  He has hx PAF & is followed by [name]> last seen 11/15 & said to be doing well on the "pill in the pocket"strategy for control; rare palpit, no CP/ SOB; on ASA81, Diltiazem12058mam and Flecainide 100m15m needed for AFib (hasn't needed)...    He is followed by [name] [id number]>619moRA on Enbrel & MTX => we do not have notes from him... We reviewed prob list, meds, xrays and labs> see below for updates >>   LABS 2/16:  Chems- wnl x GOT=120 GPT=248 (these were prev wnl);  AlkPhos=103, Amylase=50 (27-131) & Lipase=36 (11-59);  CBC- wnl;  Sed=5...   Abd Ultrasound 2/16 showed absent GB, ducts ok, poor vis of liver & pancreas, elarged spleen, kidneys ok, no other signif abn noted... PLAN>> Hx progressive abd discomfort w/ "attacks" of pain & some n/v; Labs w/ abn GOT/GPT but norm AlkPhos, Amylase/Lipase, and Sed=5; Sonar w/ splenomegaly, poor vis of liver & pancreas; we discussed Rx w/ stronger PPI for now- Nexium40 bid and refer to GI for further evaluation (?CT Abd and EGD?) and rx... ADDENDUM>> Pt called 07/22/14 w/ update- now on Enbrel & off MTX, LFTs are normal, appetite is good & RA under control, on Nexium40 before dinner & this is helping he says...  ~  January 16, 2015:  19mo R5mo Adonai Leamanre for his CPX> prev symptoms resolved and LFTs ret to normal off MTX;  Now feeling well, breathing is good w/o cough/ sput/ SOB etc... We reviewed the following medical problems during today's office visit >>     Ex-smoker> he quit in 2011 & has remained off cigs; breathing is good he says, no issues; exerc w/ yard work, splits wood, etc; CXR is clear...    PAF> on Diltiazem120 & off Flecainide (prn per [name] "pill in the pocket" regimen); he has used this med on only one occas during the last few yrs.    Chol> on diet alone & wt=226#; FLP is stable w/ TChol 152, TG 88,  HDL 41, LDL 93; we reviewed diet & wt reduction strategies...    Dyspepsia> on Nexium 40mg/d58mhis helps to control his symptoms; he saw [name] 11/12 at [facility] for RUQ pain after lifting- CT was neg & the pain was felt to be musculoskeletal pain; s/p lap chole 7/11 for stones; GI symptoms 2/16  resolved off MTX... ...    Divertics> followed by [facility] for [facility]; colon 2011 was neg & f/u should be 7-71yr...    RA> followed by DGlean Salenevery 3-628mon Enbrel & off MTX now- doing very well on monotherapy; we do not have notes or labs from Rheum...    Hx dysplastic nevus, fair skinned/ red hair> advised to get Derm eval at least yearly & he promises to do so... We reviewed prob list, meds, xrays and labs> see below for updates >> up to date on Immuniz...  CXR 8/16 showed norm heart size, clear lungs, NAD...   LABS 8/16:  FLP- at goals on diet alone;  Chems- wnl;  CBC- wnl;  TSH=1.63;  PSA=0.80;  Mg=2.1 (wnl)...   ~  December 24, 2015:  1y34yrV & add-on appt requested for URI symptoms> RanBerlyns a CPX sched for 32mo832mom now but called for this add-on appt due to a URI-- c/o sore throat, sinus drainage, cough, green sput, feeling bad, HA, ?temp 99/ +chills/ no sweats, grad onset over 1wk after exposure to grandkids... Meds include:  CardizemCD120, Nexium40, Ativan1mg 53m, and ENBREL50/wk per [name];  He is allergic to PCN... EXAM shows Afeb, VSS, O2sat=96% on RA;  HEENT- sl red, no exud, no adenopathy;  Chest- clear w/o w/r/r;  Heart- RR w/o m/r/g;  Abd- soft, nontender, neg;  Ext- neg w/o c/c/e;  Nuero- intact w/o focal abn...  CXR 12/24/15> norm heart size, calcif in Ao arch, clear lungs w/ sl prom interstitial markings in LLL, NAD...   LABS 12/24/15>  Chems- wnl, BS=105;  CBC- ok w/ Hg=13.1, WBC=11.9 w/ 73%segs;  TSH=1.93 IMP/PLAN>>  URI, acute bronchitis-- we discussed Rx w/ Depo120, Levaquin500 x7d, Mucinex600-2Bid + fluids, rec OTC Align/ Activia yogurt etc...           Problem List:      Ex-CIGARETTE SMOKER (ICD-305.1) - started as teen, up to 1/2 ppd, quit for up to 44yrs,34yrently 1/2 ppd but he says he quit 6/11 & has remained quit!  No hx resp tract diseases...  ~  baseline CXRs showed clear, NAD... ~  Marland KitchenXR 7/11 showed essent clear lungs, +gallstone seen in right abd... ~  CXR 8/13 showed normal heart size, clear lungs, low lung vols & sl elev of right hemidiaph, s/p GB... ~  CXR 8/15 showed norm heart size, clear lungs, NAD. ~  CXR 8/16 showed norm heart size, clear lungs, NAD  PAF >> managed by [name] for Cards- first diagnosed 11/13 when he presented to ER w/ palpit; placed on CCB and converted to NSR... ~  He remains stable on Diltiazem 120 Qam and Flecainide "pill in the pocket" strategy (100mg p107malpit)... ~  EKG 11/15 showed NSR, rate75, NAD...   HYPERCHOLESTEROLEMIA, BORDERLINE (ICD-272.4) - currently on diet Rx alone. ~  FLP 1/09 showed TChol 198, TG 127, HDL 34, LDL 138 ~  FLP 6/10 showed TChol 170, TG 96, HDL 40, LDL 111 ~  FLP 6/11 showed TChol 184, TG 104, HDL 43, LDL 120... rec low chol/ low fat diet. ~  FLP 8/12 (wt=237#) showed TChol 183, TG 97, HDL 43, LDL 121... Not really on diet "I just eat" ~  FLP 8/13 (wt=235#) showed TChol 176, TG 78, HDL 41, LDL 120 ~  FLP 8/14 on diet alone (wt=234#) showed TChol 166, TG 78, HDL 41, LDL 109 ~  FLP 8/15 on diet alone (wt=230#) showed TChol 161, TG 117, HDL 37, LDL 100 ~  FLP  8/16 on diet alone (wt=226#) showed TChol 152, TG 88, HDL 41, LDL 93  DYSPEPSIA (ICD-536.8) - takes PRILOSEC OTC daily... no dysphagia, N/V, abd pain, etc... no prev endo etc... states he really needs it daily & has pain/ difficulty if he stops the Rx. ~  S/p lap chole 7/11 by [name] for cholecystitis & stones... ~  2012: he had recurrent abd pain & saw [name] who did a CT Abd&Pelvis- NEG, NAD (prev cholecystectomy, sm hepatic dome cyst, diverticulosis, NAD); pain felt to be musculoskeletal...  DIVERTICULOSIS OF COLON  (ICD-562.10)  ~  He had colonoscopy 1/06 from DrMann= divertics, tiny polyp (path=norm mucosa), hems... ~  F/u colon 11/11 by DrNat-Mann w/ divertics, hems, sm rectal polyps= fragments of benign mucosa only...  ABDOMINAL PAIN & ELEVATED HEPATOCELLULAR ENZYMES >> resolved off MTX ~  2/16: he presented w/ several month hx abd discomfort and seems progressive- now getting "attacks" 3 times per week; states the discomfort is similar to prev GB attacks (he had GB removed by [name] in 2011); c/o lots of indigestion- notes some nausea & vomits on occas then feels better; says appetite is fair, eats ok w/o swallowing issues, then 30 mon later "it sits on my stomach"; he has noted white stool yest & states his urine has been darker; he has been on OTC Omeprazole20 w/o help he says... NOTE> he had recheck by [name] for abd pain 80yrafter GB surg- they did CT Abd and it was NEG (prev cholecystectomy, sm hepatic dome cyst, diverticulosis, NAD)..Marland KitchenMarland KitchenWe placed him on Nexium40Bid, and referred him to GI for further eval & rx... ~  LABS 2/16 revealed> Chems- wnl x GOT=120 GPT=248 (these were prev wnl); AlkPhos=103, Amylase=50 (27-131) & Lipase=36 (11-59); CBC- wnl;  Sed=5...  ~  Abd Ultrasound 2/16 showed absent GB, ducts ok, poor vis of liver & pancreas, elarged spleen, kidneys ok, no other signif abn noted.. ~  All symptoms resolved off MTX & LFTs ret to normal...  Hx of MONONUCLEOSIS (ICD-075) - in high school... had mono hepatitis... no known sequellae... he also had RMSF at age 65 pretty sick, recovered w/o sequellae. ~  labs have consistently shown normal LFTs x during his cholecystitis presentation 7/11...  RHEUMATOID ARTHRITIS >> Diagnosed w/ sero-pos RA 10/11 & referred to Rheum, [name]- on MTX currently 6/wk & occas Pred taper... ~  8/13:  He tells me that DDixonstarted ENBREL about 6 weeks ago 7 he is already feeling better (we do not have notes from Rheum). ~  8/14:  followed by DGlean Salen on Enbrel & MTX (currently 4/wk)- this has really helped & doing very well on Rx... ~  He continues to f/u w/ Rheum every 3-461moor exam & labs; [facility]... ~  Now on Enbrel alone, off MTX due to elev LFTs...  Hx of DYSPLASTIC NEVUS (ICD-216.9) - removed from base of neck/ upper back by DrHensel/ DrCrowe in 1993... ~  8/13:  He is reminded to check w/ Derm at least yearly due to his fair complexion, red hair, sun exposure, etc... ~  8/14:  He has not yet followed up w/ derm & promises to do so soon...  HEALTH MAINTENANCE:  on ASA 8168maily... ~  GI:  colonoscopy 1/06 & 11/11 by DrMann as above... ~  GU: neg DRE & PSA here remains wnl... ~  Immunizations:  age 24 30w> exsmoker- given PNEUMOVAX 6/10;  given TETANUS shot 6/10; discussed yearly FLU  vaccines.   Current Medications, Allergies, Past Medical History, Past Surgical History, Family History, and Social History were reviewed in [employer] record.   Past Surgical History:  Procedure Laterality Date  . CHOLECYSTECTOMY  2011   laparoscopic  . COLONOSCOPY  01/24/2015   x 3 in past  . dysplastic nevus  07/1991   excised from upper back  . ERCP N/A 02/18/2015   Procedure: ENDOSCOPIC RETROGRADE CHOLANGIOPANCREATOGRAPHY (ERCP);  Surgeon: Carol Ada, MD;  Location: [facility];  Service: Endoscopy;  Laterality: N/A;  . GIVENS CAPSULE STUDY  01/24/2015  . GIVENS CAPSULE STUDY N/A 01/24/2015   Procedure: GIVENS CAPSULE STUDY;  Surgeon: Juanita Craver, MD;  Location: [facility];  Service: Endoscopy;  Laterality: N/A;  . L foot injury  age 56   w/ part amputation great toe  . TONSILLECTOMY AND ADENOIDECTOMY  as a child    Outpatient Encounter Prescriptions as of 12/24/2015  Medication Sig Dispense Refill  . Ascorbic Acid (VITAMIN C) 500 MG tablet Take 500 mg by mouth every morning. once daily with rose hips    . Cholecalciferol (VITAMIN D3) 2000 UNITS CHEW Chew 1 tablet by  mouth daily.    [name] diltiazem (CARDIZEM CD) 120 MG 24 hr capsule TAKE 1 CAPSULE (120 MG TOTAL) BY MOUTH DAILY. 90 capsule 3  . esomeprazole (NEXIUM) 40 MG capsule TAKE 1 CAPSULE (40 MG TOTAL) BY MOUTH DAILY. 90 capsule 0  . etanercept (ENBREL) 50 MG/ML injection Inject 50 mg into the skin once a week. On mondays    . folic acid (FOLVITE) 025 MCG tablet Take 800 mcg by mouth daily.      [name] LORazepam (ATIVAN) 1 MG tablet TAKE 1/2 TO 1 TABLET 3 TIMES DAILY AS NEEDED 90 tablet 1  . Magnesium 250 MG TABS Take 250 mg by mouth every other day.    . Multiple Vitamin (MULTIVITAMIN WITH MINERALS) TABS tablet Take 1 tablet by mouth daily.    [name] levofloxacin (LEVAQUIN) 500 MG tablet Take 1 tablet (500 mg total) by mouth daily. 7 tablet 0  . [EXPIRED] methylPREDNISolone acetate (DEPO-MEDROL) injection 120 mg      No facility-administered encounter medications on file as of 12/24/2015.     Allergies  Allergen Reactions  . Caffeine Other (See Comments)    Causes him to go into afib  . Flecainide Other (See Comments)    Caused heart to race too fast and had him in the ED  . Methotrexate Derivatives Nausea And Vomiting    Increased liver enzymes  . Pseudoephedrine-Ibuprofen Other (See Comments)    " afib"   . Penicillins Rash    .Marland KitchenHas patient had a PCN reaction causing immediate rash, facial/tongue/throat swelling, SOB or lightheadedness with hypotension: No, YES TO RASH  Has patient had a PCN reaction causing severe rash involving mucus membranes or skin necrosis: No Has patient had a PCN reaction that required hospitalization No Has patient had a PCN reaction occurring within the last 10 years: No If all of the above answers are "NO", then may proceed with Cephalosporin use.    Immunization History  Administered Date(s) Administered  . Influenza Split 02/22/2011, 03/14/2012, 03/16/2013, 02/08/2014, 02/22/2015  . Pneumococcal Polysaccharide-23 10/24/2008  . Td 09/21/2001, 10/24/2008  . Tdap  03/07/2014    Review of Systems        See HPI - all other systems neg except as noted... Presents 2/16 c/o abd discomfort, intermit n/v, white stoools and dark urine.Marland KitchenMarland Kitchen  The patient denies anorexia, fever, weight loss, weight gain, vision loss, decreased hearing, hoarseness, chest pain, syncope, dyspnea on exertion, peripheral edema, prolonged cough, headaches, hemoptysis, melena, hematochezia, hematuria, incontinence, muscle weakness, suspicious skin lesions, transient blindness, difficulty walking, depression, unusual weight change, abnormal bleeding, enlarged lymph nodes, and angioedema.   Prev c/o omplains of joint pain, joint swelling, loss of strength, and stiffness (all improved on MTX); denies joint redness, low back pain, mid back pain, muscle aches, cramps, and thoracic pain.   Objective:   Physical Exam     WD, Overweight, 65 y/o WM in NAD... GENERAL:  Alert & oriented; pleasant & cooperative... HEENT:  [place]/AT, EOM-wnl, PERRLA, Fundi-benign, EACs-clear, TMs-wnl, NOSE-clear, THROAT-clear & wnl. NECK:  Supple w/ full ROM; no JVD; normal carotid impulses w/o bruits; no thyromegaly or nodules palpated; no lymphadenopathy. CHEST:  Clear to P & A; without wheezes/ rales/ or rhonchi. HEART:  Regular Rhythm; without murmurs/ rubs/ or gallops. ABDOMEN:  Soft & non-tender; s/p lap chole, normal bowel sounds; no organomegaly or masses detected. RECTAL:  Neg - prostate 2+ & nontender w/o nodules; stool hematest neg EXT: deformity L great toe, sl swelling & tender MCPs, some IPs, & wrist; no varicose veins/ venous insuffic/ or edema. NEURO:  CN's intact; motor testing normal; sensory testing normal; gait normal & balance OK. DERM:  scar in upper back at base of neck toward R side, no new lesions noted...  RADIOLOGY DATA:  Reviewed in the EPIC EMR & discussed w/ the patient...  LABORATORY DATA:  Reviewed in the EPIC EMR & discussed w/ the patient...   Assessment & Plan:    12/24/15>  URI/ Bronchitis-- Rx w/ Depo, Levaquin, Mucinex, align...   PAF>  New prob 11/13- followed by [name] on Cardizem & Flecainide prn...  CHOL>  As noted w/ LDL down to 93 - needs better diet, wt reduction or consider low dose statin rx, he will decide...  Dyspepsia>  On Nexium daily...  Divertics/ sm polyps>  follwed by DrNat-Mann & up to date on colon screening etc...  Hx of abd discomfort assoc w/ "attacks" of pain, indigestion, & intermit n/v; Labs w/ elev GOT/GPT; Sonar w/ splenomegaly but poor vis of liver/pancreas; placed on NEXIUM40Bid & referred to GI for further eval &rx => all symptoms resolved off MTX and LFTs ret ro normal...  Urinary symptoms 10/15>> hx is somewhat vague & seems minor; min tender over bladder otherw norm exam; Labs & UA are clear/wnl; Rec Fluids, Cranberry juice, tylenol prn, & observe; symptoms resolved...  Sero-positive RA>  Followed by Glean Salen on ENBREL & now off MTX...  Other medical problems as noted...  CPX>  He quit smoking 6/11;  Baseline EKG w/ NSR, NSSTTWA, NAD;  Baseline CXR = NAD; Labs look good as well...    Patient's Medications  New Prescriptions   LEVOFLOXACIN (LEVAQUIN) 500 MG TABLET    Take 1 tablet (500 mg total) by mouth daily.  Previous Medications   ASCORBIC ACID (VITAMIN C) 500 MG TABLET    Take 500 mg by mouth every morning. once daily with rose hips   CHOLECALCIFEROL (VITAMIN D3) 2000 UNITS CHEW    Chew 1 tablet by mouth daily.   DILTIAZEM (CARDIZEM CD) 120 MG 24 HR CAPSULE    TAKE 1 CAPSULE (120 MG TOTAL) BY MOUTH DAILY.   ESOMEPRAZOLE (NEXIUM) 40 MG CAPSULE    TAKE 1 CAPSULE (40 MG TOTAL) BY MOUTH DAILY.   ETANERCEPT (ENBREL) 50 MG/ML INJECTION    Inject  50 mg into the skin once a week. On mondays   FOLIC ACID (FOLVITE) 390 MCG TABLET    Take 800 mcg by mouth daily.     LORAZEPAM (ATIVAN) 1 MG TABLET    TAKE 1/2 TO 1 TABLET 3 TIMES DAILY AS NEEDED   MAGNESIUM 250 MG TABS    Take 250 mg by mouth every other day.   MULTIPLE  VITAMIN (MULTIVITAMIN WITH MINERALS) TABS TABLET    Take 1 tablet by mouth daily.  Modified Medications   No medications on file  Discontinued Medications   No medications on file

## 2015-12-24 NOTE — Patient Instructions (Signed)
Today we updated your med list in our EPIC system...    Continue your current medications the same...  Today we checked a follow up CXR & some blood work...    We will contact you w/ the results when available...   We decided to treat your Acute bronchitic infection w/ LEVAQUIN 500mg  tabs- take one tab daily til gone...    We also gave you a Depo shot today for the bronchial inflammation...    You may also use the OTC MUCINEX 600mg  tabs= 1to2 tabs twice daily w/ fluids for the cough & thick phlegm...  I also recommend taking a probiotic like ALIGN daily & one ACTIVIA Yogurt daily while you are on the broad spectrum antibiotics like Levaquin...  Call for any questions or if I can be of service in any way.Marland KitchenMarland Kitchen

## 2016-01-01 ENCOUNTER — Telehealth: Payer: Self-pay | Admitting: *Deleted

## 2016-01-01 MED ORDER — LORAZEPAM 1 MG PO TABS: ORAL_TABLET | ORAL | 5 refills | Status: DC

## 2016-01-01 NOTE — Telephone Encounter (Signed)
refll has been called to the pharmacy.

## 2016-01-01 NOTE — Telephone Encounter (Signed)
Lorazepam last refilled 09/15/15 #90 x1 refill 1/2-1 tab po tid prn  Please advise Dr.Nadel thanks

## 2016-01-15 ENCOUNTER — Ambulatory Visit: Payer: BLUE CROSS/BLUE SHIELD | Admitting: Pulmonary Disease

## 2016-01-16 ENCOUNTER — Ambulatory Visit: Payer: BLUE CROSS/BLUE SHIELD | Admitting: Pulmonary Disease

## 2016-01-21 ENCOUNTER — Other Ambulatory Visit (INDEPENDENT_AMBULATORY_CARE_PROVIDER_SITE_OTHER): Payer: BLUE CROSS/BLUE SHIELD

## 2016-01-21 ENCOUNTER — Encounter: Payer: Self-pay | Admitting: Pulmonary Disease

## 2016-01-21 ENCOUNTER — Ambulatory Visit (INDEPENDENT_AMBULATORY_CARE_PROVIDER_SITE_OTHER)
Admission: RE | Admit: 2016-01-21 | Discharge: 2016-01-21 | Disposition: A | Discharge status: Home / Self Care | Payer: BLUE CROSS/BLUE SHIELD | Source: Ambulatory Visit | Attending: Pulmonary Disease | Admitting: Pulmonary Disease

## 2016-01-21 ENCOUNTER — Ambulatory Visit (INDEPENDENT_AMBULATORY_CARE_PROVIDER_SITE_OTHER): Payer: BLUE CROSS/BLUE SHIELD | Admitting: Pulmonary Disease

## 2016-01-21 VITALS — BP 126/82 | HR 66 | Temp 97.3°F | Ht 72.0 in | Wt 220.1 lb

## 2016-01-21 DIAGNOSIS — K219 Gastro-esophageal reflux disease without esophagitis: Secondary | ICD-10-CM

## 2016-01-21 DIAGNOSIS — Z0000 Encounter for general adult medical examination without abnormal findings: Secondary | ICD-10-CM

## 2016-01-21 DIAGNOSIS — M069 Rheumatoid arthritis, unspecified: Secondary | ICD-10-CM

## 2016-01-21 DIAGNOSIS — I480 Paroxysmal atrial fibrillation: Secondary | ICD-10-CM

## 2016-01-21 DIAGNOSIS — K5730 Diverticulosis of large intestine without perforation or abscess without bleeding: Secondary | ICD-10-CM | POA: Diagnosis not present

## 2016-01-21 DIAGNOSIS — E7800 Pure hypercholesterolemia, unspecified: Secondary | ICD-10-CM

## 2016-01-21 DIAGNOSIS — Z87891 Personal history of nicotine dependence: Secondary | ICD-10-CM

## 2016-01-21 DIAGNOSIS — Z23 Encounter for immunization: Secondary | ICD-10-CM

## 2016-01-21 LAB — LIPID PANEL
CHOL/HDL RATIO: 3
Cholesterol: 141 mg/dL (ref 0–200)
HDL: 49.1 mg/dL (ref >39.00)
LDL CALC: 75 mg/dL (ref 0–99)
NonHDL: 91.7
TRIGLYCERIDES: 84 mg/dL (ref 0.0–149.0)
VLDL: 16.8 mg/dL (ref 0.0–40.0)

## 2016-01-21 LAB — PSA: PSA: 2.15 ng/mL (ref 0.10–4.00)

## 2016-01-21 NOTE — Patient Instructions (Signed)
Today we updated your med list in our EPIC system...    Continue your current medications the same...  Today we did a follow up CXR as suggested by the radiologist; and we checked your Lipid profile & PSA...    We will contact you w/ the results when available...   Keep up the great work w/ diet & exercise...  Call for any questions...  Let's plan a follow up visit in 84yr, sooner if needed for problems.Marland KitchenMarland Kitchen

## 2016-01-21 NOTE — Progress Notes (Signed)
Subjective:    Patient ID: William West, male    DOB: 1950/06/07, 65 y.o.   MRN: [id number]  HPI 65 y/o WM, husb of William West, here for a follow up visit & CPX... ~  SEE PREV EPIC NOTES FOR OLDER DATA >>    CXR 8/13 showed normal heart size, clear lungs, low lung vols & sl elev of right hemidiaph, s/p GB...  EKG 8/13 showed NSR, rate62, wnl, NAD...  LABS 8/13:  FLP- ok x LDL=120 on diet alone;  Chems- wnl;  CBC- wnl;  TSH=1.81;  PSA=0.62   LABS 8/14:  FLP- at goals on diet x LDL=109;  Chems- wnl;  CBC- wnl;  TSH=1.90;  PSA=0.67...  ~  January 14, 2014:  Yearly ROV & CPX> William West reports a good yr overall; he continues to see [name] for RA on Enbrel & MTX every 3-34mofor labs and OVs (we do not have recent notes from them);  He notes sl cough w/ greenish sput in the AMs but no f/c/s, chest discomfort, or SOB; we discussed ZPak Mucinex, Fluids for prn use;  He also had a bout of diarrhea 6/15=> went to ER w/ neg stoll studies, treated empirically w/ Cipro/Flagyl & symptoms resolved...     Ex-smoker> he quit in 2011 & has remained off cigs; breathing is good he says, no issues; exerc w/ yard work, splits wood, etc; CXR is clear...    PAF> on Diltiazem120 & Flecainide prn per [name] "pill in the pocket" regimen (see below); he has used this med on only one occas during the last yr he says...    Chol> on diet alone & wt=230# (unchanged); FLP is stable w/ TChol 161, TG 117, HDL 37, LDL 100; we reviewed diet & wt reduction strategies...    Dyspepsia> on Prilosec 261md & this helps to control his symptoms; he saw [name] [facility]12 at [facility] for RUQ [facility] after lifting- CT was neg & the pain was felt to be musculoskeletal pain; s/p lap chole 7/11 for stones...    [facility] colon 2011 was neg & f/u should be 7-1044yr.    RA> followed by DrBGlean Salenery 3-68mo468moEnbrel & MTX (currently 4/wk)- this has really helped & doing very well on Rx; we do not have notes or labs  from Rheum...    Hx dysplastic nevus, fair skinned/ red hair> advised to get Derm eval at least yearly & he promises to do so... We reviewed prob list, meds, xrays and labs> see below for updates >>   CXR 8/15 showed norm heart size, clear lungs, NAD...  LABS 6/15 via ER>  Chems- wnl;  CBC- wnl   LABS 8/15:  FLP- ok on diet alone;  TSH= 1.86;  PSA= 0.87   ~  March 05, 2014:  6wk ROV & add-on appt requested for 5d hx pain, tenderness, discomfort in suprapubic area but denies dysuria, hematuria, foul smelling urine, etc;  He notes "pressure" but no burning, no flankl or back pain, etc and he notes urine stream appears ok w/ good force;  Initially he felt chilled, shakey, but didn't take temp- he notes that tylenol helped;  States he had similar episode 1-2 yrs ago & symptoms resolved w/ meds called in for him at that time...  Exam is essentially neg- except min tender over bladder, neg CVAT, prostate is 2+ and normal, stool heme neg... We decided to check UA (clear), and Labs (all wnl- chems, CBC, Sed); and  cover empirically w/ Fluids and cranberry juice, ok to continue Tylenol, we are holding antibiotics until C&S returns... We reviewed prob list, meds, xrays and labs> see below for updates >>   LABS 10/15:  Chems- wnl w/ Cr=1.1;  CBC- wnl w/ Hg=13.9 & WBC=7.7;  Sed=17;  UA- clear w/ C&S pending...   ~  July 02, 2014:  58moROV & add-on appt requested for Abd Pain> states this is going on for several months and seems progressive- now getting "attacks" 3 times per week; states the discomfort is similar to prev GB attacks (he had GB removed by [name] in 2011); c/o lots of indigestion- notes some nausea & vomits on occas then feels better; says appetite is fair, eats ok w/o swallowing issues, then 30 mon later "it sits on my stomach"; he has noted white stool yest & states his urine has been darker; he has been on OTC Omeprazole20 w/o help he says... NOTE> he had recheck by [name] for abd  pain 145yrfter GB surg- they did CT Abd and it was NEG (prev cholecystectomy, sm hepatic dome cyst, diverticulosis, NAD)...Marland KitchenMarland Kitchen  He has hx PAF & is followed by [name]> last seen 11/15 & said to be doing well on the "pill in the pocket"strategy for control; rare palpit, no CP/ SOB; on ASA81, Diltiazem12058mam and Flecainide 100m15m needed for AFib (hasn't needed)...    He is followed by [name] Q3-19mo>619moRA on Enbrel & MTX => we do not have notes from him... We reviewed prob list, meds, xrays and labs> see below for updates >>   LABS 2/16:  Chems- wnl x GOT=120 GPT=248 (these were prev wnl);  AlkPhos=103, Amylase=50 (27-131) & Lipase=36 (11-59);  CBC- wnl;  Sed=5...   Abd Ultrasound 2/16 showed absent GB, ducts ok, poor vis of liver & pancreas, elarged spleen, kidneys ok, no other signif abn noted... PLAN>> Hx progressive abd discomfort w/ "attacks" of pain & some n/v; Labs w/ abn GOT/GPT but norm AlkPhos, Amylase/Lipase, and Sed=5; Sonar w/ splenomegaly, poor vis of liver & pancreas; we discussed Rx w/ stronger PPI for now- Nexium40 bid and refer to GI for further evaluation (?CT Abd and EGD?) and rx... ADDENDUM>> Pt called 07/22/14 w/ update- now on Enbrel & off MTX, LFTs are normal, appetite is good & RA under control, on Nexium40 before dinner & this is helping he says...  ~  January 16, 2015:  19mo R5mo William West for his CPX> prev symptoms resolved and LFTs ret to normal off MTX;  Now feeling well, breathing is good w/o cough/ sput/ SOB etc... We reviewed the following medical problems during today's office visit >>     Ex-smoker> he quit in 2011 & has remained off cigs; breathing is good he says, no issues; exerc w/ yard work, splits wood, etc; CXR is clear...    PAF> on Diltiazem120 & off Flecainide (prn per [name] "pill in the pocket" regimen); he has used this med on only one occas during the last few yrs.    Chol> on diet alone & wt=226#; FLP is stable w/ TChol 152, TG 88,  HDL 41, LDL 93; we reviewed diet & wt reduction strategies...    Dyspepsia> on Nexium 40mg/d58mhis helps to control his symptoms; he saw [name] 11/12 at [facility] for RUQ pain after lifting- CT was neg & the pain was felt to be musculoskeletal pain; s/p lap chole 7/11 for stones; GI symptoms 2/16  resolved off MTX... ...    Divertics> [facility]; colon 2011 was neg & f/u should be 7-47yr...    RA> followed by DGlean Salenevery 3-647mon Enbrel & off MTX now- doing very well on monotherapy; we do not have notes or labs from Rheum...    Hx dysplastic nevus, fair skinned/ red hair> advised to get Derm eval at least yearly & he promises to do so... We reviewed prob list, meds, xrays and labs> see below for updates >> up to date on Immuniz...  CXR 8/16 showed norm heart size, clear lungs, NAD...   LABS 8/16:  FLP- at goals on diet alone;  Chems- wnl;  CBC- wnl;  TSH=1.63;  PSA=0.80;  Mg=2.1 (wnl)...   ~  December 24, 2015:  1y65yrV & add-on appt requested for URI symptoms> RanSimss a CPX sched for 49mo68mom now but called for this add-on appt due to a URI-- c/o sore throat, sinus drainage, cough, green sput, feeling bad, HA, ?temp 99/ +chills/ no sweats, grad onset over 1wk after exposure to grandkids... Meds include:  CardizemCD120, Nexium40, Ativan1mg 47m, and ENBREL50/wk per [name];  He is allergic to PCN... EXAM shows Afeb, VSS, O2sat=96% on RA;  HEENT- sl red, no exud, no adenopathy;  Chest- clear w/o w/r/r;  Heart- RR w/o m/r/g;  Abd- soft, nontender, neg;  Ext- neg w/o c/c/e;  Nuero- intact w/o focal abn...  CXR 12/24/15> norm heart size, calcif in Ao arch, clear lungs w/ sl prom interstitial markings in LLL, NAD...   LABS 12/24/15>  Chems- wnl, BS=105;  CBC- ok w/ Hg=13.1, WBC=11.9 w/ 73%segs;  TSH=1.93 IMP/PLAN>>  URI, acute bronchitis-- we discussed Rx w/ Depo120, Levaquin500 x7d, Mucinex600-2Bid + fluids, rec OTC Align/ Activia yogurt etc...   ~  January 21, 2016:  3wk ROV & pt is  here for his yearly ROV recheck>  William West's prev bronchitis has resolved, no GI issues, and back to baseline- doing satis w/o any new complaints or concerns; He is concerned because a friend was Dx w/ Lung CaWe reviewed the following medical problems during today's office visit >>     Ex-smoker> he quit in 2011 & has remained off cigs; breathing is good he says, no issues; exerc w/ yard work, splits wood, etc; acute bronchitis episode 12/2015- resolved w/ Levaquin; f/u CXR is clear...    PAF> on Diltiazem120 & off Flecainide (prn per [name]- he takes extra Diltiazem "prn"); he has used this extra med several times over the last yr; he says that [name] told him not to take ASA (?due to bleeding)...    Chol> on diet alone & wt=220#; FLP 8/17 is stable w/ TChol 141, TG 84, HDL 49, LDL 75; we reviewed diet & wt reduction strategies...    Dyspepsia> on Nexium 40mg/44mthis helps to control his symptoms; he saw [name] 11/12 at [facility] for RUQ pain after lifting- CT was neg & the pain was felt to be musculoskeletal pain; s/p lap chole 7/11 for stones; GI symptoms 2/16 resolved off MTX... ...    [facility]> [facility]; colon 2011 was neg & f/u should be 7-21yrs.71yr Urology> he saw DrMcKenzie 08/2015 for blood in semen- cleared spont, no recurrence, UA was clear; hematospermia felt to be benign & f/u prn...    RA> followed by DrBeekmGlean Salen3-70mo on 70moel & off MTX now- doing very well on monotherapy; last note 11/19/15 reviewed- RA of mult sites, on  Enbrel, pos Rheum factor, doing satis,     Hx dysplastic nevus, fair skinned/ red hair> advised to get Derm eval at least yearly & he promises to do so... EXAM shows Afeb, VSS, O2sat=100% on RA;  HEENT- sl red, no exud, no adenopathy;  Chest- clear w/o w/r/r;  Heart- RR w/o m/r/g;  Abd- soft, nontender, neg;  Ext- neg w/o c/c/e;  Nuero- intact w/o focal abn...  CXR 01/21/16>  Norm heart size, Ao atherosclerosis, clear lungs, NAD...  LABS 01/21/16>   FLP- wnl on diet alone;  PSA=2.15 => NOTE: this is up from 0.80 last yr & we will recheck in 73mo IMP/PLAN>>  RKelleyhas recovered from his bronchitis, other problems stable, note incr PSA velocity & we will recheck PSA in 655mo.          Problem List:     Ex-CIGARETTE SMOKER (ICD-305.1) - started as teen, up to 1/2 ppd, quit for up to 5y73yrrecently 1/2 ppd but he says he quit 6/11 & has remained quit!  No hx resp tract diseases...  ~  baseline CXRs showed clear, NAD... [name]  CXR 7/11 showed essent clear lungs, +gallstone seen in right abd... ~  CXR 8/13 showed normal heart size, clear lungs, low lung vols & sl elev of right hemidiaph, s/p GB... ~  CXR 8/15 showed norm heart size, clear lungs, NAD. ~  CXR 8/16 showed norm heart size, clear lungs, NAD  PAF >> managed by [name] for Cards- first diagnosed 11/13 when he presented to ER w/ palpit; placed on CCB and converted to NSR... ~  He remains stable on Diltiazem 120 Qam and Flecainide "pill in the pocket" strategy (100m7mn palpit)... ~  EKG 11/15 showed NSR, rate75, NAD...   HYPERCHOLESTEROLEMIA, BORDERLINE (ICD-272.4) - currently on diet Rx alone. ~  FLP 1/09 showed TChol 198, TG 127, HDL 34, LDL 138 ~  FLP 6/10 showed TChol 170, TG 96, HDL 40, LDL 111 ~  FLP 6/11 showed TChol 184, TG 104, HDL 43, LDL 120... rec low chol/ low fat diet. ~  FLP 8/12 (wt=237#) showed TChol 183, TG 97, HDL 43, LDL 121... Not really on diet "I just eat" ~  FLP 8/13 (wt=235#) showed TChol 176, TG 78, HDL 41, LDL 120 ~  FLP 8/14 on diet alone (wt=234#) showed TChol 166, TG 78, HDL 41, LDL 109 ~  FLP 8/15 on diet alone (wt=230#) showed TChol 161, TG 117, HDL 37, LDL 100 ~  FLP 8/16 on diet alone (wt=226#) showed TChol 152, TG 88, HDL 41, LDL 93  DYSPEPSIA (ICD-536.8) - takes PRILOSEC OTC daily... no dysphagia, N/V, abd pain, etc... no prev endo etc... states he really needs it daily & has pain/ difficulty if he stops the Rx. ~  S/p lap chole 7/11 by  [name] for cholecystitis & stones... ~  2012: he had recurrent abd pain & saw [name] who did a CT Abd&Pelvis- NEG, NAD (prev cholecystectomy, sm hepatic dome cyst, diverticulosis, NAD); pain felt to be musculoskeletal...  DIVERTICULOSIS OF COLON (ICD-562.10)  ~  He had colonoscopy 1/06 from DrMann= divertics, tiny polyp (path=norm mucosa), hems... ~  F/u colon 11/11 by DrNat-Mann w/ divertics, hems, sm rectal polyps= fragments of benign mucosa only...  ABDOMINAL PAIN & ELEVATED HEPATOCELLULAR ENZYMES >> resolved off MTX ~  2/16: he presented w/ several month hx abd discomfort and seems progressive- now getting "attacks" 3 times per week; states the discomfort is similar to prev GB attacks (he had GB  removed by [name] in 2011); c/o lots of indigestion- notes some nausea & vomits on occas then feels better; says appetite is fair, eats ok w/o swallowing issues, then 30 mon later "it sits on my stomach"; he has noted white stool yest & states his urine has been darker; he has been on OTC Omeprazole20 w/o help he says... NOTE> he had recheck by [name] for abd pain 46yrafter GB surg- they did CT Abd and it was NEG (prev cholecystectomy, sm hepatic dome cyst, diverticulosis, NAD)..Marland KitchenMarland KitchenWe placed him on Nexium40Bid, and referred him to GI for further eval & rx... ~  LABS 2/16 revealed> Chems- wnl x GOT=120 GPT=248 (these were prev wnl); AlkPhos=103, Amylase=50 (27-131) & Lipase=36 (11-59); CBC- wnl;  Sed=5...  ~  Abd Ultrasound 2/16 showed absent GB, ducts ok, poor vis of liver & pancreas, elarged spleen, kidneys ok, no other signif abn noted.. ~  All symptoms resolved off MTX & LFTs ret to normal...  Hx of MONONUCLEOSIS (ICD-075) - in high school... had mono hepatitis... no known sequellae... he also had RMSF at age 65 pretty sick, recovered w/o sequellae. ~  labs have consistently shown normal LFTs x during his cholecystitis presentation 7/11...  RHEUMATOID ARTHRITIS >> Diagnosed w/  sero-pos RA 10/11 & referred to Rheum, [name]- on MTX currently 6/wk & occas Pred taper... ~  8/13:  He tells me that DBladesstarted ENBREL about 6 weeks ago 7 he is already feeling better (we do not have notes from Rheum). ~  8/14:  followed by DGlean Salenon Enbrel & MTX (currently 4/wk)- this has really helped & doing very well on Rx... ~  He continues to f/u w/ Rheum every 3-426moor exam & labs; we do not have notes or labs from [facility]... ~  Now on Enbrel alone, off MTX due to elev LFTs...  Hx of DYSPLASTIC NEVUS (ICD-216.9) - removed from base of neck/ upper back by DrHensel/ DrCrowe in 1993... ~  8/13:  He is reminded to check w/ Derm at least yearly due to his fair complexion, red hair, sun exposure, etc... ~  8/14:  He has not yet followed up w/ derm & promises to do so soon...  HEALTH MAINTENANCE:  on ASA 81108maily... ~  GI:  colonoscopy 1/06 & 11/11 by DrMann as above... ~  GU: neg DRE & PSA here remains wnl... ~  Immunizations:  age 50 3w> exsmoker- given PNEUMOVAX 6/10;  given TETANUS shot 6/10; discussed yearly FLU vaccines.   Current Medications, Allergies, Past Medical History, Past Surgical History, Family History, and Social History were reviewed in [employer].   Past Surgical History:  Procedure Laterality Date  . CHOLECYSTECTOMY  2011   laparoscopic  . COLONOSCOPY  01/24/2015   x 3 in past  . dysplastic nevus  07/1991   excised from upper back  . ERCP N/A 02/18/2015   Procedure: ENDOSCOPIC RETROGRADE CHOLANGIOPANCREATOGRAPHY (ERCP);  Surgeon: PatCarol AdaD;  [facility];  Service: Endoscopy;  Laterality: N/A;  . GIVENS CAPSULE STUDY  01/24/2015  . GIVENS CAPSULE STUDY N/A 01/24/2015   Procedure: GIVENS CAPSULE STUDY;  Surgeon: JyoJuanita CraverD;  [facility];  Laterality: N/A;  . L foot injury  age 89 19w/ part amputation great toe  . TONSILLECTOMY AND ADENOIDECTOMY  as a  child    Outpatient Encounter Prescriptions as of 01/21/2016  Medication Sig Dispense Refill  . Ascorbic Acid (VITAMIN C) 500  MG tablet Take 500 mg by mouth every morning. once daily with rose hips    . Cholecalciferol (VITAMIN D3) 2000 UNITS CHEW Chew 1 tablet by mouth daily.    [name] diltiazem (CARDIZEM CD) 120 MG 24 hr capsule TAKE 1 CAPSULE (120 MG TOTAL) BY MOUTH DAILY. 90 capsule 3  . esomeprazole (NEXIUM) 40 MG capsule TAKE 1 CAPSULE (40 MG TOTAL) BY MOUTH DAILY. 90 capsule 0  . etanercept (ENBREL) 50 MG/ML injection Inject 50 mg into the skin once a week. On mondays    . folic acid (FOLVITE) 884 MCG tablet Take 800 mcg by mouth daily.      [name] LORazepam (ATIVAN) 1 MG tablet TAKE 1/2 TO 1 TABLET 3 TIMES DAILY AS NEEDED 90 tablet 5  . Magnesium 250 MG TABS Take 250 mg by mouth every other day.    . Melatonin 2.5 MG CHEW Prn at bedtime    . Multiple Vitamin (MULTIVITAMIN WITH MINERALS) TABS tablet Take 1 tablet by mouth daily.    . [DISCONTINUED] levofloxacin (LEVAQUIN) 500 MG tablet Take 1 tablet (500 mg total) by mouth daily. (Patient not taking: Reported on 01/21/2016) 7 tablet 0   No facility-administered encounter medications on file as of 01/21/2016.     Allergies  Allergen Reactions  . Caffeine Other (See Comments)    Causes him to go into afib  . Flecainide Other (See Comments)    Caused heart to race too fast and had him in the ED  . Methotrexate Derivatives Nausea And Vomiting    Increased liver enzymes  . Pseudoephedrine-Ibuprofen Other (See Comments)    " afib"   . Penicillins Rash    .Marland KitchenHas patient had a PCN reaction causing immediate rash, facial/tongue/throat swelling, SOB or lightheadedness with hypotension: No, YES TO RASH  Has patient had a PCN reaction causing severe rash involving mucus membranes or skin necrosis: No Has patient had a PCN reaction that required hospitalization No Has patient had a PCN reaction occurring within the last 10 years: No If all of the  above answers are "NO", then may proceed with Cephalosporin use.    Immunization History  Administered Date(s) Administered  . Influenza Split 02/22/2011, 03/14/2012, 03/16/2013, 02/08/2014, 02/22/2015  . Influenza,inj,Quad PF,36+ Mos 01/21/2016  . Pneumococcal Polysaccharide-23 10/24/2008  . Td 09/21/2001, 10/24/2008  . Tdap 03/07/2014    Review of Systems        See HPI - all other systems neg except as noted... Presents 2/16 c/o abd discomfort, intermit n/v, white stoools and dark urine...       The patient denies anorexia, fever, weight loss, weight gain, vision loss, decreased hearing, hoarseness, chest pain, syncope, dyspnea on exertion, peripheral edema, prolonged cough, headaches, hemoptysis, melena, hematochezia, hematuria, incontinence, muscle weakness, suspicious skin lesions, transient blindness, difficulty walking, depression, unusual weight change, abnormal bleeding, enlarged lymph nodes, and angioedema.   Prev c/o omplains of joint pain, joint swelling, loss of strength, and stiffness (all improved on MTX); denies joint redness, low back pain, mid back pain, muscle aches, cramps, and thoracic pain.   Objective:   Physical Exam     WD, Overweight, 65 y/o WM in NAD... GENERAL:  Alert & oriented; pleasant & cooperative... HEENT:  [place]/AT, EOM-wnl, PERRLA, Fundi-benign, EACs-clear, TMs-wnl, NOSE-clear, THROAT-clear & wnl. NECK:  Supple w/ full ROM; no JVD; normal carotid impulses w/o bruits; no thyromegaly or nodules palpated; no lymphadenopathy. CHEST:  Clear to P & A; without wheezes/ rales/ or rhonchi. HEART:  Regular Rhythm;  without murmurs/ rubs/ or gallops. ABDOMEN:  Soft & non-tender; s/p lap chole, normal bowel sounds; no organomegaly or masses detected. RECTAL:  Neg - prostate 2+ & nontender w/o nodules; stool hematest neg EXT: deformity L great toe, sl swelling & tender MCPs, some IPs, & wrist; no varicose veins/ venous insuffic/ or edema. NEURO:  CN's intact; motor  testing normal; sensory testing normal; gait normal & balance OK. DERM:  scar in upper back at base of neck toward R side, no new lesions noted...  RADIOLOGY DATA:  Reviewed in the EPIC EMR & discussed w/ the patient...  LABORATORY DATA:  Reviewed in the EPIC EMR & discussed w/ the patient...   Assessment & Plan:    12/24/15> URI/ Bronchitis-- Rx w/ Depo, Levaquin, Mucinex, align...   PAF>  New prob 11/13- followed by [name] on Cardizem & Flecainide prn...  CHOL>  As noted w/ LDL down to 93 - needs better diet, wt reduction or consider low dose statin rx, he will decide...  Dyspepsia>  On Nexium daily...  Divertics/ sm polyps>  follwed by DrNat-Mann & up to date on colon screening etc...  Hx of abd discomfort assoc w/ "attacks" of pain, indigestion, & intermit n/v; Labs w/ elev GOT/GPT; Sonar w/ splenomegaly but poor vis of liver/pancreas; placed on NEXIUM40Bid & referred to GI for further eval &rx => all symptoms resolved off MTX and LFTs ret ro normal...  Urinary symptoms 10/15>> hx is somewhat vague & seems minor; min tender over bladder otherw norm exam; Labs & UA are clear/wnl; Rec Fluids, Cranberry juice, tylenol prn, & observe; symptoms resolved...  Sero-positive RA>  Followed by Glean Salen on ENBREL & now off MTX...  Other medical problems as noted...  CPX>  He quit smoking 6/11;  Baseline EKG w/ NSR, NSSTTWA, NAD;  Baseline CXR = NAD; Labs look good as well...    Patient's Medications  New Prescriptions   No medications on file  Previous Medications   ASCORBIC ACID (VITAMIN C) 500 MG TABLET    Take 500 mg by mouth every morning. once daily with rose hips   CHOLECALCIFEROL (VITAMIN D3) 2000 UNITS CHEW    Chew 1 tablet by mouth daily.   DILTIAZEM (CARDIZEM CD) 120 MG 24 HR CAPSULE    TAKE 1 CAPSULE (120 MG TOTAL) BY MOUTH DAILY.   ESOMEPRAZOLE (NEXIUM) 40 MG CAPSULE    TAKE 1 CAPSULE (40 MG TOTAL) BY MOUTH DAILY.   ETANERCEPT (ENBREL) 50 MG/ML INJECTION    Inject 50 mg  into the skin once a week. On mondays   FOLIC ACID (FOLVITE) 270 MCG TABLET    Take 800 mcg by mouth daily.     LORAZEPAM (ATIVAN) 1 MG TABLET    TAKE 1/2 TO 1 TABLET 3 TIMES DAILY AS NEEDED   MAGNESIUM 250 MG TABS    Take 250 mg by mouth every other day.   MELATONIN 2.5 MG CHEW    Prn at bedtime   MULTIPLE VITAMIN (MULTIVITAMIN WITH MINERALS) TABS TABLET    Take 1 tablet by mouth daily.  Modified Medications   No medications on file  Discontinued Medications   LEVOFLOXACIN (LEVAQUIN) 500 MG TABLET    Take 1 tablet (500 mg total) by mouth daily.

## 2016-02-02 ENCOUNTER — Telehealth: Payer: Self-pay | Admitting: Pulmonary Disease

## 2016-02-02 MED ORDER — AZITHROMYCIN 250 MG PO TABS: ORAL_TABLET | ORAL | 0 refills | Status: AC

## 2016-02-02 NOTE — Telephone Encounter (Signed)
Per SN---  Continue the mucinex zpak #1  Take as directed hycotuss  #6oz  1 tsp every 6 hours prn cough.  thanks

## 2016-02-02 NOTE — Telephone Encounter (Signed)
Spoke with pt. He is aware of SN's recommendations. Pt declined the cough syrup prescription. Rx for Zpack has been sent in. Nothing further was needed.

## 2016-02-02 NOTE — Telephone Encounter (Signed)
Spoke with pt. States that he is not feeling well. Reports coughing for 5 days. Cough is non productive. Denies chest tightness, wheezing SOB or fever. Has been using Mucinex, Robitussin OTC with minimal relief. He is leaving for a trip tomorrow and would like to SN recommendations before he leaves.  Allergies  Allergen Reactions  . Caffeine Other (See Comments)    Causes him to go into afib  . Flecainide Other (See Comments)    Caused heart to race too fast and had him in the ED  . Methotrexate Derivatives Nausea And Vomiting    Increased liver enzymes  . Pseudoephedrine-Ibuprofen Other (See Comments)    " afib"   . Penicillins Rash    .Marland KitchenHas patient had a PCN reaction causing immediate rash, facial/tongue/throat swelling, SOB or lightheadedness with hypotension: No, YES TO RASH  Has patient had a PCN reaction causing severe rash involving mucus membranes or skin necrosis: No Has patient had a PCN reaction that required hospitalization No Has patient had a PCN reaction occurring within the last 10 years: No If all of the above answers are "NO", then may proceed with Cephalosporin use.    SN - please advise. Thanks.

## 2016-04-01 ENCOUNTER — Ambulatory Visit (INDEPENDENT_AMBULATORY_CARE_PROVIDER_SITE_OTHER): Payer: BLUE CROSS/BLUE SHIELD | Admitting: Acute Care

## 2016-04-01 ENCOUNTER — Encounter: Payer: Self-pay | Admitting: Acute Care

## 2016-04-01 DIAGNOSIS — J069 Acute upper respiratory infection, unspecified: Secondary | ICD-10-CM | POA: Insufficient documentation

## 2016-04-01 MED ORDER — AZITHROMYCIN 250 MG PO TABS: ORAL_TABLET | ORAL | 0 refills | Status: DC

## 2016-04-01 MED ORDER — NEOMYCIN-POLYMYXIN-GRAMICIDIN 1.75-10000-.025 OP SOLN: 1.0000 [drp] | Freq: Three times a day (TID) | OPHTHALMIC | 0 refills | Status: DC

## 2016-04-01 NOTE — Assessment & Plan Note (Signed)
Acute URI x 6 days in patient taking Embrel for RA Plan: We will treat you with  z pack today for your Upper Respiratory symptoms. We will prescribe neosporin opthalmic eye drops for your pink eye.  Use 1 drop 3 times daily 4-12 hours apart for 7 days. Follow up with Dr. Lenna Gilford in 1-2 weeks. Please contact office for sooner follow up if symptoms do not improve or worsen or seek emergency care

## 2016-04-01 NOTE — Progress Notes (Signed)
History of Present Illness William West is a 65 y.o. male with    04/01/2016 Acute OV: Pt. Presents for acute visit for Upper Respiratory Symptoms and Pink eye in the left eye. He states the upper respiratory symptoms started 6 days ago. He states he has a sore throat and some post nasal drip. Cough which is productive for yellow to tan secretions . He has not taken any antihistamines or allergy medications.We discussed starting Claritin 10 mg once daily, but he does not want to take it as he has atrial fibrillation. He has 2 grandchildren ages 50 and 67 who he was with for a few nights, and the 64 year old has been sick.He takes Embrel for RA so thresh hold to treat is low to treat. He appears to have pink eye in his left eye. It is red with conjunctivitis, and drainage. Again, he has had his 64 year old and 64 year old staying with him and both go to daycare. He denies fever, chest pain, orthopnea or hemoptysis.  Tests   Past medical hx Past Medical History:  Diagnosis Date  . A-fib ([place])   . Acute arthritis   . Allergic rhinitis   . Atrial fibrillation ([place])    history of a fib  . Dermatitis   . Diverticulosis of colon   . Dysplastic nevus   . GERD (gastroesophageal reflux disease)   . GI bleeding 01/24/2015  . Hypercholesterolemia   . Mononucleosis   . RA (rheumatoid arthritis) ([place])   . Tobacco use disorder      Past surgical hx, Family hx, Social hx all reviewed.  Current Outpatient Prescriptions on File Prior to Visit  Medication Sig  . Ascorbic Acid (VITAMIN C) 500 MG tablet Take 500 mg by mouth every morning. once daily with rose hips  . Cholecalciferol (VITAMIN D3) 2000 UNITS CHEW Chew 1 tablet by mouth daily.  [name] diltiazem (CARDIZEM CD) 120 MG 24 hr capsule TAKE 1 CAPSULE (120 MG TOTAL) BY MOUTH DAILY.  [name] esomeprazole (NEXIUM) 40 MG capsule TAKE 1 CAPSULE (40 MG TOTAL) BY MOUTH DAILY.  [name] etanercept (ENBREL) 50 MG/ML injection Inject 50 mg into the skin once a week.  On mondays  . folic acid (FOLVITE) [id number] MCG tablet Take 800 mcg by mouth daily.    [name] LORazepam (ATIVAN) 1 MG tablet TAKE 1/2 TO 1 TABLET 3 TIMES DAILY AS NEEDED  . Magnesium 250 MG TABS Take 250 mg by mouth every other day.  . Melatonin 2.5 MG CHEW Prn at bedtime  . Multiple Vitamin (MULTIVITAMIN WITH MINERALS) TABS tablet Take 1 tablet by mouth daily.   No current facility-administered medications on file prior to visit.      Allergies  Allergen Reactions  . Caffeine Other (See Comments)    Causes him to go into afib  . Flecainide Other (See Comments)    Caused heart to race too fast and had him in the ED  . Methotrexate Derivatives Nausea And Vomiting    Increased liver enzymes  . Pseudoephedrine-Ibuprofen Other (See Comments)    " afib"   . Penicillins Rash    .Marland KitchenHas patient had a PCN reaction causing immediate rash, facial/tongue/throat swelling, SOB or lightheadedness with hypotension: No, YES TO RASH  Has patient had a PCN reaction causing severe rash involving mucus membranes or skin necrosis: No Has patient had a PCN reaction that required hospitalization No Has patient had a PCN reaction occurring within the last 10 years: No  If all of the above answers are "NO", then may proceed with Cephalosporin use.    Review Of Systems:  Constitutional:   No  weight loss, night sweats,  Fevers, chills, fatigue, or  lassitude.  HEENT:   No headaches,  Difficulty swallowing,  Tooth/dental problems, or  Sore throat,                No sneezing, itching, ear ache, nasal congestion, +post nasal drip,   CV:  No chest pain,  Orthopnea, PND, swelling in lower extremities, anasarca, dizziness, palpitations, syncope.   GI  No heartburn, indigestion, abdominal pain, nausea, vomiting, diarrhea, change in bowel habits, loss of appetite, bloody stools.   Resp: No shortness of breath with exertion or at rest.  + excess mucus, + productive cough,  No non-productive cough,  No coughing up of blood.   + change in color of mucus.  No wheezing.  No chest wall deformity  Skin: no rash or lesions.  GU: no dysuria, change in color of urine, no urgency or frequency.  No flank pain, no hematuria   MS:  No joint pain or swelling.  No decreased range of motion.  No back pain.  Psych:  No change in mood or affect. No depression or anxiety.  No memory loss.   Vital Signs BP 122/84 (BP Location: Left Arm, Patient Position: Sitting, Cuff Size: Normal)   Pulse 64   Ht 6' (1.829 m)   Wt 223 lb 3.2 oz (101.2 kg)   SpO2 100%   BMI 30.27 kg/m    Physical Exam:  General- No distress,  A&Ox3, pleasant ENT: No sinus tenderness, TM clear, edematous nasal mucosa, no oral exudate,+ post nasal drip, no LAN, + pimk left eye with tachy drainage. Cardiac: S1, S2, regular rate and rhythm, no murmur Chest: No wheeze/ rales/ dullness; no accessory muscle use, no nasal flaring, no sternal retractions Abd.: Soft Non-tender Ext: No clubbing cyanosis, edema Neuro:  normal strength Skin: No rashes, warm and dry Psych: normal mood and behavior   Assessment/Plan  Acute upper respiratory infection Acute URI x 6 days in patient taking Embrel for RA Plan: We will treat you with  z pack today for your Upper Respiratory symptoms. We will prescribe neosporin opthalmic eye drops for your pink eye.  Use 1 drop 3 times daily 4-12 hours apart for 7 days. Follow up with Dr. Lenna Gilford in 1-2 weeks. Please contact office for sooner follow up if symptoms do not improve or worsen or seek emergency care      [name], NP 04/01/2016  10:19 AM

## 2016-04-01 NOTE — Patient Instructions (Addendum)
It is nice to meet you today. We will treat you with  z pack today for your Upper Respiratory symptoms. We will prescribe neosporin opthalmic eye drops for your pink eye.  Use 1 drop 3 times daily 4-12 hours apart for 7 days. Follow up with Dr. Lenna Gilford in 1-2 weeks. Please contact office for sooner follow up if symptoms do not improve or worsen or seek emergency care

## 2016-04-19 ENCOUNTER — Encounter: Payer: Self-pay | Admitting: Pulmonary Disease

## 2016-04-19 ENCOUNTER — Ambulatory Visit (INDEPENDENT_AMBULATORY_CARE_PROVIDER_SITE_OTHER): Payer: BLUE CROSS/BLUE SHIELD | Admitting: Pulmonary Disease

## 2016-04-19 VITALS — BP 130/82 | HR 65 | Temp 97.7°F | Ht 72.0 in | Wt 220.2 lb

## 2016-04-19 DIAGNOSIS — J069 Acute upper respiratory infection, unspecified: Secondary | ICD-10-CM | POA: Diagnosis not present

## 2016-04-19 DIAGNOSIS — M069 Rheumatoid arthritis, unspecified: Secondary | ICD-10-CM | POA: Diagnosis not present

## 2016-04-19 DIAGNOSIS — I480 Paroxysmal atrial fibrillation: Secondary | ICD-10-CM | POA: Diagnosis not present

## 2016-04-19 MED ORDER — AZITHROMYCIN 250 MG PO TABS: ORAL_TABLET | ORAL | 3 refills | Status: DC

## 2016-04-19 NOTE — Patient Instructions (Signed)
Today we updated your med list in our EPIC system...    Continue your current medications the same...  We wrote for a ZPAK to keep on hand & use at the onset of any similar upper resp infection...  Call for any questions or if we can be of service in any way.Marland KitchenMarland Kitchen

## 2016-04-19 NOTE — Progress Notes (Signed)
Subjective:    Patient ID: [name], male    DOB: 1950/06/07, 65 y.o.   MRN: [id number]  HPI 65 y/o WM, husb of William West, here for a follow up visit & CPX... ~  SEE PREV EPIC NOTES FOR OLDER DATA >>    CXR 8/13 showed normal heart size, clear lungs, low lung vols & sl elev of right hemidiaph, s/p GB...  EKG 8/13 showed NSR, rate62, wnl, NAD...  LABS 8/13:  FLP- ok x LDL=120 on diet alone;  Chems- wnl;  CBC- wnl;  TSH=1.81;  PSA=0.62   LABS 8/14:  FLP- at goals on diet x LDL=109;  Chems- wnl;  CBC- wnl;  TSH=1.90;  PSA=0.67...  ~  January 14, 2014:  Yearly ROV & CPX> William West reports a good yr overall; he continues to see [name] for RA on Enbrel & MTX every 3-34mofor labs and OVs (we do not have recent notes from them);  He notes sl cough w/ greenish sput in the AMs but no f/c/s, chest discomfort, or SOB; we discussed ZPak Mucinex, Fluids for prn use;  He also had a bout of diarrhea 6/15=> went to ER w/ neg stoll studies, treated empirically w/ Cipro/Flagyl & symptoms resolved...     Ex-smoker> he quit in 2011 & has remained off cigs; breathing is good he says, no issues; exerc w/ yard work, splits wood, etc; CXR is clear...    PAF> on Diltiazem120 & Flecainide prn per [name] "pill in the pocket" regimen (see below); he has used this med on only one occas during the last yr he says...    Chol> on diet alone & wt=230# (unchanged); FLP is stable w/ TChol 161, TG 117, HDL 37, LDL 100; we reviewed diet & wt reduction strategies...    Dyspepsia> on Prilosec 261md & this helps to control his symptoms; he saw [name] [facility]12 at [facility] for RUQ pain after lifting- CT was neg & the pain was felt to be musculoskeletal pain; s/p lap chole 7/11 for stones...    [facility] colon 2011 was neg & f/u should be 7-1044yr.    RA> followed by DrBGlean Salenery 3-68mo468moEnbrel & MTX (currently 4/wk)- this has really helped & doing very well on Rx; we do not have notes or labs  from Rheum...    Hx dysplastic nevus, fair skinned/ red hair> advised to get Derm eval at least yearly & he promises to do so... We reviewed prob list, meds, xrays and labs> see below for updates >>   CXR 8/15 showed norm heart size, clear lungs, NAD...  LABS 6/15 via ER>  Chems- wnl;  CBC- wnl   LABS 8/15:  FLP- ok on diet alone;  TSH= 1.86;  PSA= 0.87   ~  March 05, 2014:  6wk ROV & add-on appt requested for 5d hx pain, tenderness, discomfort in suprapubic area but denies dysuria, hematuria, foul smelling urine, etc;  He notes "pressure" but no burning, no flankl or back pain, etc and he notes urine stream appears ok w/ good force;  Initially he felt chilled, shakey, but didn't take temp- he notes that tylenol helped;  States he had similar episode 1-2 yrs ago & symptoms resolved w/ meds called in for him at that time...  Exam is essentially neg- except min tender over bladder, neg CVAT, prostate is 2+ and normal, stool heme neg... We decided to check UA (clear), and Labs (all wnl- chems, CBC, Sed); and  cover empirically w/ Fluids and cranberry juice, ok to continue Tylenol, we are holding antibiotics until C&S returns... We reviewed prob list, meds, xrays and labs> see below for updates >>   LABS 10/15:  Chems- wnl w/ Cr=1.1;  CBC- wnl w/ Hg=13.9 & WBC=7.7;  Sed=17;  UA- clear w/ C&S pending...   ~  July 02, 2014:  58moROV & add-on appt requested for Abd Pain> states this is going on for several months and seems progressive- now getting "attacks" 3 times per week; states the discomfort is similar to prev GB attacks (he had GB removed by [name] in 2011); c/o lots of indigestion- notes some nausea & vomits on occas then feels better; says appetite is fair, eats ok w/o swallowing issues, then 30 mon later "it sits on my stomach"; he has noted white stool yest & states his urine has been darker; he has been on OTC Omeprazole20 w/o help he says... NOTE> he had recheck by [name] for abd  pain 145yrfter GB surg- they did CT Abd and it was NEG (prev cholecystectomy, sm hepatic dome cyst, diverticulosis, NAD)...Marland KitchenMarland Kitchen  He has hx PAF & is followed by [name]> last seen 11/15 & said to be doing well on the "pill in the pocket"strategy for control; rare palpit, no CP/ SOB; on ASA81, Diltiazem12058mam and Flecainide 100m15m needed for AFib (hasn't needed)...    He is followed by [name] Q3-19mo>619moRA on Enbrel & MTX => we do not have notes from him... We reviewed prob list, meds, xrays and labs> see below for updates >>   LABS 2/16:  Chems- wnl x GOT=120 GPT=248 (these were prev wnl);  AlkPhos=103, Amylase=50 (27-131) & Lipase=36 (11-59);  CBC- wnl;  Sed=5...   Abd Ultrasound 2/16 showed absent GB, ducts ok, poor vis of liver & pancreas, elarged spleen, kidneys ok, no other signif abn noted... PLAN>> Hx progressive abd discomfort w/ "attacks" of pain & some n/v; Labs w/ abn GOT/GPT but norm AlkPhos, Amylase/Lipase, and Sed=5; Sonar w/ splenomegaly, poor vis of liver & pancreas; we discussed Rx w/ stronger PPI for now- Nexium40 bid and refer to GI for further evaluation (?CT Abd and EGD?) and rx... ADDENDUM>> Pt called 07/22/14 w/ update- now on Enbrel & off MTX, LFTs are normal, appetite is good & RA under control, on Nexium40 before dinner & this is helping he says...  ~  January 16, 2015:  19mo R5mo William West for his CPX> prev symptoms resolved and LFTs ret to normal off MTX;  Now feeling well, breathing is good w/o cough/ sput/ SOB etc... We reviewed the following medical problems during today's office visit >>     Ex-smoker> he quit in 2011 & has remained off cigs; breathing is good he says, no issues; exerc w/ yard work, splits wood, etc; CXR is clear...    PAF> on Diltiazem120 & off Flecainide (prn per [name] "pill in the pocket" regimen); he has used this med on only one occas during the last few yrs.    Chol> on diet alone & wt=226#; FLP is stable w/ TChol 152, TG 88,  HDL 41, LDL 93; we reviewed diet & wt reduction strategies...    Dyspepsia> on Nexium 40mg/d58mhis helps to control his symptoms; he saw [name] 11/12 at [facility] for RUQ pain after lifting- CT was neg & the pain was felt to be musculoskeletal pain; s/p lap chole 7/11 for stones; GI symptoms 2/16  resolved off MTX... ...    Divertics> [facility]; colon 2011 was neg & f/u should be 7-47yr...    RA> followed by DGlean Salenevery 3-647mon Enbrel & off MTX now- doing very well on monotherapy; we do not have notes or labs from Rheum...    Hx dysplastic nevus, fair skinned/ red hair> advised to get Derm eval at least yearly & he promises to do so... We reviewed prob list, meds, xrays and labs> see below for updates >> up to date on Immuniz...  CXR 8/16 showed norm heart size, clear lungs, NAD...   LABS 8/16:  FLP- at goals on diet alone;  Chems- wnl;  CBC- wnl;  TSH=1.63;  PSA=0.80;  Mg=2.1 (wnl)...   ~  December 24, 2015:  1y65yrV & add-on appt requested for URI symptoms> William West a CPX sched for 49mo68mom now but called for this add-on appt due to a URI-- c/o sore throat, sinus drainage, cough, green sput, feeling bad, HA, ?temp 99/ +chills/ no sweats, grad onset over 1wk after exposure to grandkids... Meds include:  CardizemCD120, Nexium40, Ativan1mg 47m, and ENBREL50/wk per [name];  He is allergic to PCN... EXAM shows Afeb, VSS, O2sat=96% on RA;  HEENT- sl red, no exud, no adenopathy;  Chest- clear w/o w/r/r;  Heart- RR w/o m/r/g;  Abd- soft, nontender, neg;  Ext- neg w/o c/c/e;  Nuero- intact w/o focal abn...  CXR 12/24/15> norm heart size, calcif in Ao arch, clear lungs w/ sl prom interstitial markings in LLL, NAD...   LABS 12/24/15>  Chems- wnl, BS=105;  CBC- ok w/ Hg=13.1, WBC=11.9 w/ 73%segs;  TSH=1.93 IMP/PLAN>>  URI, acute bronchitis-- we discussed Rx w/ Depo120, Levaquin500 x7d, Mucinex600-2Bid + fluids, rec OTC Align/ Activia yogurt etc...   ~  January 21, 2016:  3wk ROV & pt is  here for his yearly ROV recheck>  William West's prev bronchitis has resolved, no GI issues, and back to baseline- doing satis w/o any new complaints or concerns; He is concerned because a friend was Dx w/ Lung CaWe reviewed the following medical problems during today's office visit >>     Ex-smoker> he quit in 2011 & has remained off cigs; breathing is good he says, no issues; exerc w/ yard work, splits wood, etc; acute bronchitis episode 12/2015- resolved w/ Levaquin; f/u CXR is clear...    PAF> on Diltiazem120 & off Flecainide (prn per [name]- he takes extra Diltiazem "prn"); he has used this extra med several times over the last yr; he says that [name] told him not to take ASA (?due to bleeding)...    Chol> on diet alone & wt=220#; FLP 8/17 is stable w/ TChol 141, TG 84, HDL 49, LDL 75; we reviewed diet & wt reduction strategies...    Dyspepsia> on Nexium 40mg/44mthis helps to control his symptoms; he saw [name] 11/12 at [facility] for RUQ pain after lifting- CT was neg & the pain was felt to be musculoskeletal pain; s/p lap chole 7/11 for stones; GI symptoms 2/16 resolved off MTX... ...    [facility]> [facility]; colon 2011 was neg & f/u should be 7-21yrs.71yr Urology> he saw DrMcKenzie 08/2015 for blood in semen- cleared spont, no recurrence, UA was clear; hematospermia felt to be benign & f/u prn...    RA> followed by DrBeekmGlean Salen3-70mo on 70moel & off MTX now- doing very well on monotherapy; last note 11/19/15 reviewed- RA of mult sites, on  Enbrel, pos Rheum factor, doing satis,     Hx dysplastic nevus, fair skinned/ red hair> advised to get Derm eval at least yearly & he promises to do so... EXAM shows Afeb, VSS, O2sat=100% on RA;  HEENT- sl red, no exud, no adenopathy;  Chest- clear w/o w/r/r;  Heart- RR w/o m/r/g;  Abd- soft, nontender, neg;  Ext- neg w/o c/c/e;  Nuero- intact w/o focal abn...  CXR 01/21/16>  Norm heart size, Ao atherosclerosis, clear lungs, NAD...  LABS 01/21/16>   FLP- wnl on diet alone;  PSA=2.15 => NOTE: this is up from 0.80 last yr & we will recheck in 2mo IMP/PLAN>>  William West recovered from his bronchitis, other problems stable, note incr PSA velocity & we will recheck PSA in 645mo.  ~  April 19, 2016:  62m42moV & pulm recheck> William West on 04/01/17 for an acute upper resp infection- sore throat, post naasl drip, cough w/ sm amt yellow sput, exposed to grandchild w/ URI, he takes Enbrel for RA; treated w/ ZPak & also given Neosporin ophthalmic drops for pink-eye;  Asked to f/u w/ me today- improved & URI symptoms resolved...    Today William West c/o some dumping syndrome after eating, notes this is persistent after GB surg;  Also c/o right sided siatica and insomnia;  Notes considerable stress w/ leaves in his yard, prob w/ coyotes in his area (has a few farm animals/ cows; he needs to de-stress his life... Rec to use metamucil, offered Alpraz but he doesn't want new meds...    EXAM shows Afeb, VSS, O2sat=100% on RA;  HEENT- sl red, no exud, no adenopathy;  Chest- clear w/o w/r/r;  Heart- RR w/o m/r/g;  Abd- soft, nontender, neg;  Ext- neg w/o c/c/e;  Neuro- intact w/o focal abn... IMP/PLAN>>  William West's URI has resolved;  Other issues discussed- try Metamucil for dumping symptoms after eating, use Advil/ Tylenol/ heating pad for back discomfort w/ furthe eval id symptom persists; he has Melatonin and Ativan to help w/ rest/ stress... He'll be due for a follow up PSA in the spring...           Problem List:     Ex-CIGARETTE SMOKER (ICD-305.1) - started as teen, up to 1/2 ppd, quit for up to 38yr60yrecently 1/2 ppd but he says he quit 6/11 & has remained quit!  No hx resp tract diseases...  ~  baseline CXRs showed clear, NAD... ~[name] CXR 7/11 showed essent clear lungs, +gallstone seen in right abd... ~  CXR 8/13 showed normal heart size, clear lungs, low lung vols & sl elev of right hemidiaph, s/p GB... ~  CXR 8/15 showed norm heart size, clear lungs, NAD. ~   CXR 8/16 showed norm heart size, clear lungs, NAD  PAF >> managed by [name] for Cards- first diagnosed 11/13 when he presented to ER w/ palpit; placed on CCB and converted to NSR... ~  He remains stable on Diltiazem 120 Qam and Flecainide "pill in the pocket" strategy (100mg74m palpit)... ~  EKG 11/15 showed NSR, rate75, NAD...   HYPERCHOLESTEROLEMIA, BORDERLINE (ICD-272.4) - currently on diet Rx alone. ~  FLP 1/09 showed TChol 198, TG 127, HDL 34, LDL 138 ~  FLP 6/10 showed TChol 170, TG 96, HDL 40, LDL 111 ~  FLP 6/11 showed TChol 184, TG 104, HDL 43, LDL 120... rec low chol/ low fat diet. ~  FLP 8/12 (wt=237#) showed TChol 183, TG 97, HDL 43, LDL 121...Marland KitchenMarland Kitchen  Not really on diet "I just eat" ~  FLP 8/13 (wt=235#) showed TChol 176, TG 78, HDL 41, LDL 120 ~  FLP 8/14 on diet alone (wt=234#) showed TChol 166, TG 78, HDL 41, LDL 109 ~  FLP 8/15 on diet alone (wt=230#) showed TChol 161, TG 117, HDL 37, LDL 100 ~  FLP 8/16 on diet alone (wt=226#) showed TChol 152, TG 88, HDL 41, LDL 93  DYSPEPSIA (ICD-536.8) - takes PRILOSEC OTC daily... no dysphagia, N/V, abd pain, etc... no prev endo etc... states he really needs it daily & has pain/ difficulty if he stops the Rx. ~  S/p lap chole 7/11 by [name] for cholecystitis & stones... ~  2012: he had recurrent abd pain & saw [name] who did a CT Abd&Pelvis- NEG, NAD (prev cholecystectomy, sm hepatic dome cyst, diverticulosis, NAD); pain felt to be musculoskeletal...  DIVERTICULOSIS OF COLON (ICD-562.10)  ~  He had colonoscopy 1/06 from DrMann= divertics, tiny polyp (path=norm mucosa), hems... ~  F/u colon 11/11 by DrNat-Mann w/ divertics, hems, sm rectal polyps= fragments of benign mucosa only...  ABDOMINAL PAIN & ELEVATED HEPATOCELLULAR ENZYMES >> resolved off MTX ~  2/16: he presented w/ several month hx abd discomfort and seems progressive- now getting "attacks" 3 times per week; states the discomfort is similar to prev GB attacks (he had GB  removed by [name] in 2011); c/o lots of indigestion- notes some nausea & vomits on occas then feels better; says appetite is fair, eats ok w/o swallowing issues, then 30 mon later "it sits on my stomach"; he has noted white stool yest & states his urine has been darker; he has been on OTC Omeprazole20 w/o help he says... NOTE> he had recheck by [name] for abd pain 10yrafter GB surg- they did CT Abd and it was NEG (prev cholecystectomy, sm hepatic dome cyst, diverticulosis, NAD)..Marland KitchenMarland KitchenWe placed him on Nexium40Bid, and referred him to GI for further eval & rx... ~  LABS 2/16 revealed> Chems- wnl x GOT=120 GPT=248 (these were prev wnl); AlkPhos=103, Amylase=50 (27-131) & Lipase=36 (11-59); CBC- wnl;  Sed=5...  ~  Abd Ultrasound 2/16 showed absent GB, ducts ok, poor vis of liver & pancreas, elarged spleen, kidneys ok, no other signif abn noted.. ~  All symptoms resolved off MTX & LFTs ret to normal...  Hx of MONONUCLEOSIS (ICD-075) - in high school... had mono hepatitis... no known sequellae... he also had RMSF at age 65 pretty sick, recovered w/o sequellae. ~  labs have consistently shown normal LFTs x during his cholecystitis presentation 7/11...  RHEUMATOID ARTHRITIS >> Diagnosed w/ sero-pos RA 10/11 & referred to Rheum, [name]- on MTX currently 6/wk & occas Pred taper... ~  8/13:  He tells me that DAltmarstarted ENBREL about 6 weeks ago 7 he is already feeling better (we do not have notes from Rheum). ~  8/14:  followed by DGlean Salenon Enbrel & MTX (currently 4/wk)- this has really helped & doing very well on Rx... ~  He continues to f/u w/ Rheum every 3-493moor exam & labs; we do not have notes or labs [facility]... ~  Now on Enbrel alone, off MTX due to elev LFTs...  Hx of DYSPLASTIC NEVUS (ICD-216.9) - removed from base of neck/ upper back by DrHensel/ DrCrowe in 1993... ~  8/13:  He is reminded to check w/ Derm at least yearly due to his fair complexion, red hair, sun  exposure, etc... ~  8/14:  He has not yet followed  up w/ derm & promises to do so soon...  HEALTH MAINTENANCE:  on ASA 55m daily... ~  GI:  colonoscopy 1/06 & 11/11 by DrMann as above... ~  GU: neg DRE & PSA here remains wnl... ~  Immunizations:  age [age]now> exsmoker- given PNEUMOVAX 6/10;  given TETANUS shot 6/10; discussed yearly FLU vaccines.   Current Medications, Allergies, Past Medical History, Past Surgical History, Family History, and Social History were reviewed in [employer]record.   Past Surgical History:  Procedure Laterality Date  . CHOLECYSTECTOMY  2011   laparoscopic  . COLONOSCOPY  01/24/2015   x 3 in past  . dysplastic nevus  07/1991   excised from upper back  . ERCP N/A 02/18/2015   Procedure: ENDOSCOPIC RETROGRADE CHOLANGIOPANCREATOGRAPHY (ERCP);  Surgeon: PCarol Ada MD;  [facility];  Service: Endoscopy;  Laterality: N/A;  . GIVENS CAPSULE STUDY  01/24/2015  . GIVENS CAPSULE STUDY N/A 01/24/2015   Procedure: GIVENS CAPSULE STUDY;  Surgeon: JJuanita Craver MD;  [place]  Service: Endoscopy;  Laterality: N/A;  . L foot injury  age 65  w/ part amputation great toe  . TONSILLECTOMY AND ADENOIDECTOMY  as a child    Outpatient Encounter Prescriptions as of 04/19/2016  Medication Sig Dispense Refill  . Ascorbic Acid (VITAMIN C) 500 MG tablet Take 500 mg by mouth every morning. once daily with rose hips    . aspirin 81 MG tablet Take 81 mg by mouth daily.    . Cholecalciferol (VITAMIN D3) 2000 UNITS CHEW Chew 1 tablet by mouth daily.    .Marland Kitchendiltiazem (CARDIZEM CD) 120 MG 24 hr capsule TAKE 1 CAPSULE (120 MG TOTAL) BY MOUTH DAILY. 90 capsule 3  . esomeprazole (NEXIUM) 40 MG capsule TAKE 1 CAPSULE (40 MG TOTAL) BY MOUTH DAILY. 90 capsule 0  . etanercept (ENBREL) 50 MG/ML injection Inject 50 mg into the skin once a week. On mondays    . folic acid (FOLVITE) 8161MCG tablet Take 800 mcg by mouth daily.      .Marland KitchenLORazepam  (ATIVAN) 1 MG tablet TAKE 1/2 TO 1 TABLET 3 TIMES DAILY AS NEEDED 90 tablet 5  . Magnesium 250 MG TABS Take 250 mg by mouth every other day.    . Melatonin 2.5 MG CHEW Prn at bedtime    . Multiple Vitamin (MULTIVITAMIN WITH MINERALS) TABS tablet Take 1 tablet by mouth daily.    .Marland Kitchenazithromycin (ZITHROMAX) 250 MG tablet Take as directed 6 tablet 3  . [DISCONTINUED] azithromycin (ZITHROMAX) 250 MG tablet Take as written (Patient not taking: Reported on 04/19/2016) 6 tablet 0  . [DISCONTINUED] neomycin-polymyxin-gramicidin (NEOSPORIN) 1.75-10000-.025 ophthalmic solution Place 1 drop into the left eye 3 (three) times daily. 4-12 hours apart for 7 days (Patient not taking: Reported on 04/19/2016) 10 mL 0   No facility-administered encounter medications on file as of 04/19/2016.     Allergies  Allergen Reactions  . Caffeine Other (See Comments)    Causes him to go into afib  . Flecainide Other (See Comments)    Caused heart to race too fast and had him in the ED  . Methotrexate Derivatives Nausea And Vomiting    Increased liver enzymes  . Pseudoephedrine-Ibuprofen Other (See Comments)    " afib"   . Penicillins Rash    ..Marland Kitchenas patient had a PCN reaction causing immediate rash, facial/tongue/throat swelling, SOB or lightheadedness with hypotension: No, YES TO RASH  Has patient had a PCN  reaction causing severe rash involving mucus membranes or skin necrosis: No Has patient had a PCN reaction that required hospitalization No Has patient had a PCN reaction occurring within the last 10 years: No If all of the above answers are "NO", then may proceed with Cephalosporin use.    Immunization History  Administered Date(s) Administered  . Influenza Split 02/22/2011, 03/14/2012, 03/16/2013, 02/08/2014, 02/22/2015  . Influenza,inj,Quad PF,36+ Mos 01/21/2016  . Pneumococcal Polysaccharide-23 10/24/2008  . Td 09/21/2001, 10/24/2008  . Tdap 03/07/2014    Review of Systems        See HPI - all other  systems neg except as noted... Presents 2/16 c/o abd discomfort, intermit n/v, white stoools and dark urine...       The patient denies anorexia, fever, weight loss, weight gain, vision loss, decreased hearing, hoarseness, chest pain, syncope, dyspnea on exertion, peripheral edema, prolonged cough, headaches, hemoptysis, melena, hematochezia, hematuria, incontinence, muscle weakness, suspicious skin lesions, transient blindness, difficulty walking, depression, unusual weight change, abnormal bleeding, enlarged lymph nodes, and angioedema.   Prev c/o omplains of joint pain, joint swelling, loss of strength, and stiffness (all improved on MTX); denies joint redness, low back pain, mid back pain, muscle aches, cramps, and thoracic pain.   Objective:   Physical Exam     WD, Overweight, 65 y/o WM in NAD... GENERAL:  Alert & oriented; pleasant & cooperative... HEENT:  [place]/AT, EOM-wnl, PERRLA, Fundi-benign, EACs-clear, TMs-wnl, NOSE-clear, THROAT-clear & wnl. NECK:  Supple w/ full ROM; no JVD; normal carotid impulses w/o bruits; no thyromegaly or nodules palpated; no lymphadenopathy. CHEST:  Clear to P & A; without wheezes/ rales/ or rhonchi. HEART:  Regular Rhythm; without murmurs/ rubs/ or gallops. ABDOMEN:  Soft & non-tender; s/p lap chole, normal bowel sounds; no organomegaly or masses detected. RECTAL:  Neg - prostate 2+ & nontender w/o nodules; stool hematest neg EXT: deformity L great toe, sl swelling & tender MCPs, some IPs, & wrist; no varicose veins/ venous insuffic/ or edema. NEURO:  CN's intact; motor testing normal; sensory testing normal; gait normal & balance OK. DERM:  scar in upper back at base of neck toward R side, no new lesions noted...  RADIOLOGY DATA:  Reviewed in the EPIC EMR & discussed w/ the patient...  LABORATORY DATA:  Reviewed in the EPIC EMR & discussed w/ the patient...   Assessment & Plan:    12/24/15> URI/ Bronchitis-- Rx w/ Depo, Levaquin, Mucinex,  align... 03/2016> similar symptoms resolved w/ ZPak...  PAF>  New prob 11/13- followed by [name] on Cardizem & Flecainide prn...  CHOL>  As noted w/ LDL down to 93 - needs better diet, wt reduction or consider low dose statin rx, he will decide...  Dyspepsia>  On Nexium daily...  Divertics/ sm polyps>  follwed by DrNat-Mann & up to date on colon screening etc...  Hx of abd discomfort assoc w/ "attacks" of pain, indigestion, & intermit n/v; Labs w/ elev GOT/GPT; Sonar w/ splenomegaly but poor vis of liver/pancreas; placed on NEXIUM40Bid & referred to GI for further eval &rx => all symptoms resolved off MTX and LFTs ret ro normal...  Urinary symptoms 10/15>> hx is somewhat vague & seems minor; min tender over bladder otherw norm exam; Labs & UA are clear/wnl; Rec Fluids, Cranberry juice, tylenol prn, & observe; symptoms resolved...  Sero-positive RA>  Followed by Glean Salen on ENBREL & now off MTX...  Other medical problems as noted...  CPX>  He quit smoking 6/11;  Baseline EKG w/ NSR, NSSTTWA, NAD;  Baseline CXR = NAD; Labs look good as well...    Patient's Medications  New Prescriptions   AZITHROMYCIN (ZITHROMAX) 250 MG TABLET    Take as directed  Previous Medications   ASCORBIC ACID (VITAMIN C) 500 MG TABLET    Take 500 mg by mouth every morning. once daily with rose hips   ASPIRIN 81 MG TABLET    Take 81 mg by mouth daily.   CHOLECALCIFEROL (VITAMIN D3) 2000 UNITS CHEW    Chew 1 tablet by mouth daily.   DILTIAZEM (CARDIZEM CD) 120 MG 24 HR CAPSULE    TAKE 1 CAPSULE (120 MG TOTAL) BY MOUTH DAILY.   ESOMEPRAZOLE (NEXIUM) 40 MG CAPSULE    TAKE 1 CAPSULE (40 MG TOTAL) BY MOUTH DAILY.   ETANERCEPT (ENBREL) 50 MG/ML INJECTION    Inject 50 mg into the skin once a week. On mondays   FOLIC ACID (FOLVITE) 373 MCG TABLET    Take 800 mcg by mouth daily.     LORAZEPAM (ATIVAN) 1 MG TABLET    TAKE 1/2 TO 1 TABLET 3 TIMES DAILY AS NEEDED   MAGNESIUM 250 MG TABS    Take 250 mg by mouth every  other day.   MELATONIN 2.5 MG CHEW    Prn at bedtime   MULTIPLE VITAMIN (MULTIVITAMIN WITH MINERALS) TABS TABLET    Take 1 tablet by mouth daily.  Modified Medications   No medications on file  Discontinued Medications   AZITHROMYCIN (ZITHROMAX) 250 MG TABLET    Take as written   NEOMYCIN-POLYMYXIN-GRAMICIDIN (NEOSPORIN) 1.75-10000-.025 OPHTHALMIC SOLUTION    Place 1 drop into the left eye 3 (three) times daily. 4-12 hours apart for 7 days

## 2016-04-29 ENCOUNTER — Encounter: Payer: Self-pay | Admitting: Internal Medicine

## 2016-04-29 ENCOUNTER — Ambulatory Visit (INDEPENDENT_AMBULATORY_CARE_PROVIDER_SITE_OTHER): Payer: BLUE CROSS/BLUE SHIELD | Admitting: Internal Medicine

## 2016-04-29 VITALS — BP 124/72 | HR 64 | Ht 72.0 in | Wt 215.0 lb

## 2016-04-29 DIAGNOSIS — I482 Chronic atrial fibrillation, unspecified: Secondary | ICD-10-CM

## 2016-04-29 NOTE — Patient Instructions (Signed)
Your physician wants you to follow-up in: 1 year Dr Taylor You will receive a reminder letter in the mail two months in advance. If you don't receive a letter, please call our office to schedule the follow-up appointment.    Your physician recommends that you continue on your current medications as directed. Please refer to the Current Medication list given to you today.     Thank you for choosing [facility] !        

## 2016-04-29 NOTE — Progress Notes (Signed)
HPI [name] returns today for followup. He is a pleasant 65 yo man with PAF, HTN, and arthritis. In the interim he has done well utilizing a strategy of a pill in the pocket for control of his atrial fibrillation. He denies chest pain or sob. He has had rare palpitations and no hospitalizations for atrial fibrillation. He has not taken flecainide as his atrial fib resolves within and hour or four with diltiazem. Previously he had heart fluttering when he took the pill in the pocket flecainide. Allergies  Allergen Reactions  . Caffeine Other (See Comments)    Causes him to go into afib  . Flecainide Other (See Comments)    Caused heart to race too fast and had him in the ED  . Methotrexate Derivatives Nausea And Vomiting    Increased liver enzymes  . Pseudoephedrine-Ibuprofen Other (See Comments)    " afib"   . Penicillins Rash    .Marland KitchenHas patient had a PCN reaction causing immediate rash, facial/tongue/throat swelling, SOB or lightheadedness with hypotension: No, YES TO RASH  Has patient had a PCN reaction causing severe rash involving mucus membranes or skin necrosis: No Has patient had a PCN reaction that required hospitalization No Has patient had a PCN reaction occurring within the last 10 years: No If all of the above answers are "NO", then may proceed with Cephalosporin use.     Current Outpatient Prescriptions  Medication Sig Dispense Refill  . Ascorbic Acid (VITAMIN C) 500 MG tablet Take 500 mg by mouth every morning. once daily with rose hips    . aspirin 81 MG tablet Take 81 mg by mouth daily.    [name] azithromycin (ZITHROMAX) 250 MG tablet Take as directed 6 tablet 3  . Cholecalciferol (VITAMIN D3) 2000 UNITS CHEW Chew 1 tablet by mouth daily.    [name] diltiazem (CARDIZEM CD) 120 MG 24 hr capsule TAKE 1 CAPSULE (120 MG TOTAL) BY MOUTH DAILY. 90 capsule 3  . esomeprazole (NEXIUM) 40 MG capsule TAKE 1 CAPSULE (40 MG TOTAL) BY MOUTH DAILY. 90 capsule 0  . etanercept (ENBREL) 50 MG/ML  injection Inject 50 mg into the skin once a week. On mondays    . folic acid (FOLVITE) Q000111Q MCG tablet Take 800 mcg by mouth daily.      [name] LORazepam (ATIVAN) 1 MG tablet TAKE 1/2 TO 1 TABLET 3 TIMES DAILY AS NEEDED 90 tablet 5  . Magnesium 250 MG TABS Take 250 mg by mouth every other day.    . Melatonin 2.5 MG CHEW Prn at bedtime    . Multiple Vitamin (MULTIVITAMIN WITH MINERALS) TABS tablet Take 1 tablet by mouth daily.     No current facility-administered medications for this visit.      Past Medical History:  Diagnosis Date  . A-fib ([place])   . Acute arthritis   . Allergic rhinitis   . Atrial fibrillation ([place])    history of a fib  . Dermatitis   . Diverticulosis of colon   . Dysplastic nevus   . GERD (gastroesophageal reflux disease)   . GI bleeding 01/24/2015  . Hypercholesterolemia   . Mononucleosis   . RA (rheumatoid arthritis) ([place])   . Tobacco use disorder     ROS:   All systems reviewed and negative except as noted in the HPI.   Past Surgical History:  Procedure Laterality Date  . CHOLECYSTECTOMY  2011   laparoscopic  . COLONOSCOPY  01/24/2015   x 3 in past  . dysplastic  nevus  07/1991   excised from upper back  . ERCP N/A 02/18/2015   Procedure: ENDOSCOPIC RETROGRADE CHOLANGIOPANCREATOGRAPHY (ERCP);  Surgeon: William Ada, MD;  Location: [facility];  Service: Endoscopy;  Laterality: N/A;  . GIVENS CAPSULE STUDY  01/24/2015  . GIVENS CAPSULE STUDY N/A 01/24/2015   Procedure: GIVENS CAPSULE STUDY;  Surgeon: William Craver, MD;  Location: [facility];  Service: Endoscopy;  Laterality: N/A;  . L foot injury  age 22   w/ part amputation great toe  . TONSILLECTOMY AND ADENOIDECTOMY  as a child     Family History  Problem Relation Age of Onset  . Prostate cancer Father   . Other Father     pacemaker  . Heart disease Mother   . Other Mother     vascular disease     Social History   Social History  . Marital status: Married    Spouse name: William West x 38 yrs   . Number of children: 2  . Years of education: N/A   Occupational History  . [employer]    Social History Main Topics  . Smoking status: Former Smoker    Packs/day: 0.50    Years: 45.00    Types: Cigarettes    Quit date: 11/13/2009  . Smokeless tobacco: Never Used  . Alcohol use No  . Drug use: No  . Sexual activity: No   Other Topics Concern  . Not on file   Social History Narrative   One son with HBP   One son with IDDM      May return soon     BP 124/72   Pulse 64   Ht 6' (1.829 m)   Wt 215 lb (97.5 kg)   SpO2 98%   BMI 29.16 kg/m   Physical Exam:  Well appearing middle aged man, NAD HEENT: Unremarkable Neck:  6 cm JVD, no thyromegally Back:  No CVA tenderness Lungs:  Clear with no wheezes, rales, or rhonchi HEART:  Regular rate rhythm, no murmurs, no rubs, no clicks Abd:  soft, positive bowel sounds, no organomegally, no rebound, no guarding Ext:  2 plus pulses, no edema, no cyanosis, no clubbing Skin:  No rashes no nodules Neuro:  CN II through XII intact, motor grossly intact  EKG - nsr    Assess/Plan: 1. PAF - his symptoms may have increased slightly over the past year. He will continue using diltiazem as a pill in the pocket. His CHADSVASC score is still zero. He does not have atrial fib 2. GI bleeding - he has a h/o diverticulosis. He will take his low dose ASA every other day. 3. Dyslipidemia - he will continue his low fat diet and exercise.  William West.D.

## 2016-07-07 ENCOUNTER — Telehealth: Payer: Self-pay | Admitting: Pulmonary Disease

## 2016-07-07 MED ORDER — OSELTAMIVIR PHOSPHATE 75 MG PO CAPS: 75.0000 mg | ORAL_CAPSULE | Freq: Two times a day (BID) | ORAL | 0 refills | Status: DC

## 2016-07-07 NOTE — Telephone Encounter (Signed)
Spoke with pt. He is aware of SN's recommendations. Rx has been sent in. Nothing further was needed.

## 2016-07-07 NOTE — Telephone Encounter (Signed)
Spoke with pt. States that he is not feeling well and thinks he might have the flu. Reports body aches, fever, chills and cough. Cough is producing clear mucus. Denies chest tightness, wheezing or SOB. Symptoms started yesterday. Pt's grandchildren were diagnosed with the flu and he came in contact with them on Monday. He would like to know if there are any cough syrups he can take that won't affect his A-Fib.  SN - please advise. Thanks.  Allergies  Allergen Reactions  . Caffeine Other (See Comments)    Causes him to go into afib  . Flecainide Other (See Comments)    Caused heart to race too fast and had him in the ED  . Methotrexate Derivatives Nausea And Vomiting    Increased liver enzymes  . Pseudoephedrine-Ibuprofen Other (See Comments)    " afib"   . Penicillins Rash    .Marland KitchenHas patient had a PCN reaction causing immediate rash, facial/tongue/throat swelling, SOB or lightheadedness with hypotension: No, YES TO RASH  Has patient had a PCN reaction causing severe rash involving mucus membranes or skin necrosis: No Has patient had a PCN reaction that required hospitalization No Has patient had a PCN reaction occurring within the last 10 years: No If all of the above answers are "NO", then may proceed with Cephalosporin use.   Current Outpatient Prescriptions on File Prior to Visit  Medication Sig Dispense Refill  . Ascorbic Acid (VITAMIN C) 500 MG tablet Take 500 mg by mouth every morning. once daily with rose hips    . aspirin 81 MG tablet Take 81 mg by mouth daily.    [name] azithromycin (ZITHROMAX) 250 MG tablet Take as directed 6 tablet 3  . Cholecalciferol (VITAMIN D3) 2000 UNITS CHEW Chew 1 tablet by mouth daily.    [name] diltiazem (CARDIZEM CD) 120 MG 24 hr capsule TAKE 1 CAPSULE (120 MG TOTAL) BY MOUTH DAILY. 90 capsule 3  . esomeprazole (NEXIUM) 40 MG capsule TAKE 1 CAPSULE (40 MG TOTAL) BY MOUTH DAILY. 90 capsule 0  . etanercept (ENBREL) 50 MG/ML injection Inject 50 mg into the  skin once a week. On mondays    . folic acid (FOLVITE) Q000111Q MCG tablet Take 800 mcg by mouth daily.      [name] LORazepam (ATIVAN) 1 MG tablet TAKE 1/2 TO 1 TABLET 3 TIMES DAILY AS NEEDED 90 tablet 5  . Magnesium 250 MG TABS Take 250 mg by mouth every other day.    . Melatonin 2.5 MG CHEW Prn at bedtime    . Multiple Vitamin (MULTIVITAMIN WITH MINERALS) TABS tablet Take 1 tablet by mouth daily.     No current facility-administered medications on file prior to visit.

## 2016-07-07 NOTE — Telephone Encounter (Signed)
Per SN---  tamiflu 75 mg  #10 1 po bid Rest at home Fluids Tylenol Chicken soup

## 2016-07-12 ENCOUNTER — Telehealth: Payer: Self-pay | Admitting: Pulmonary Disease

## 2016-07-12 NOTE — Telephone Encounter (Signed)
Pt returning call.William West ° °

## 2016-07-12 NOTE — Telephone Encounter (Signed)
Attempted to contact pt. Received busy signal x2. Will try back.  

## 2016-07-12 NOTE — Telephone Encounter (Addendum)
Per SN---  Yes he should take the zpak.  thanks

## 2016-07-12 NOTE — Telephone Encounter (Signed)
Spoke with pt. States that he is having issues with a cough. Cough is producing green mucus. Denies chest tightness, wheezing, SOB or fever. Was prescribed Tamiflu and states that he did improve when taking this. He is currently taking Mucinex OTC. States that he has a standing prescription for Zpack and wants to know if SN wants him to take this.  SN - please advise. Thanks.  Allergies  Allergen Reactions  . Caffeine Other (See Comments)    Causes him to go into afib  . Flecainide Other (See Comments)    Caused heart to race too fast and had him in the ED  . Methotrexate Derivatives Nausea And Vomiting    Increased liver enzymes  . Pseudoephedrine-Ibuprofen Other (See Comments)    " afib"   . Penicillins Rash    .Marland KitchenHas patient had a PCN reaction causing immediate rash, facial/tongue/throat swelling, SOB or lightheadedness with hypotension: No, YES TO RASH  Has patient had a PCN reaction causing severe rash involving mucus membranes or skin necrosis: No Has patient had a PCN reaction that required hospitalization No Has patient had a PCN reaction occurring within the last 10 years: No If all of the above answers are "NO", then may proceed with Cephalosporin use.   Current Outpatient Prescriptions on File Prior to Visit  Medication Sig Dispense Refill  . Ascorbic Acid (VITAMIN C) 500 MG tablet Take 500 mg by mouth every morning. once daily with rose hips    . aspirin 81 MG tablet Take 81 mg by mouth daily.    [name] azithromycin (ZITHROMAX) 250 MG tablet Take as directed 6 tablet 3  . Cholecalciferol (VITAMIN D3) 2000 UNITS CHEW Chew 1 tablet by mouth daily.    [name] diltiazem (CARDIZEM CD) 120 MG 24 hr capsule TAKE 1 CAPSULE (120 MG TOTAL) BY MOUTH DAILY. 90 capsule 3  . esomeprazole (NEXIUM) 40 MG capsule TAKE 1 CAPSULE (40 MG TOTAL) BY MOUTH DAILY. 90 capsule 0  . etanercept (ENBREL) 50 MG/ML injection Inject 50 mg into the skin once a week. On mondays    . folic acid (FOLVITE) Q000111Q MCG  tablet Take 800 mcg by mouth daily.      [name] LORazepam (ATIVAN) 1 MG tablet TAKE 1/2 TO 1 TABLET 3 TIMES DAILY AS NEEDED 90 tablet 5  . Magnesium 250 MG TABS Take 250 mg by mouth every other day.    . Melatonin 2.5 MG CHEW Prn at bedtime    . Multiple Vitamin (MULTIVITAMIN WITH MINERALS) TABS tablet Take 1 tablet by mouth daily.    [name] oseltamivir (TAMIFLU) 75 MG capsule Take 1 capsule (75 mg total) by mouth 2 (two) times daily. 10 capsule 0   No current facility-administered medications on file prior to visit.

## 2016-07-12 NOTE — Telephone Encounter (Signed)
Spoke with pt. He is aware of SN's recommendation. Nothing further was needed.

## 2016-07-12 NOTE — Telephone Encounter (Signed)
Attempted to contact pt. His wife answered the phone and advised him to call his cell number >> [phone]. I called this number and the line rang busy. Will try back.

## 2016-07-21 DIAGNOSIS — Z79899 Other long term (current) drug therapy: Secondary | ICD-10-CM | POA: Diagnosis not present

## 2016-07-21 DIAGNOSIS — M7989 Other specified soft tissue disorders: Secondary | ICD-10-CM | POA: Diagnosis not present

## 2016-07-21 DIAGNOSIS — Z6829 Body mass index (BMI) 29.0-29.9, adult: Secondary | ICD-10-CM | POA: Diagnosis not present

## 2016-07-21 DIAGNOSIS — M0579 Rheumatoid arthritis with rheumatoid factor of multiple sites without organ or systems involvement: Secondary | ICD-10-CM | POA: Diagnosis not present

## 2016-07-21 DIAGNOSIS — E663 Overweight: Secondary | ICD-10-CM | POA: Diagnosis not present

## 2016-08-30 ENCOUNTER — Other Ambulatory Visit: Payer: Self-pay | Admitting: Internal Medicine

## 2016-11-18 DIAGNOSIS — M0579 Rheumatoid arthritis with rheumatoid factor of multiple sites without organ or systems involvement: Secondary | ICD-10-CM | POA: Diagnosis not present

## 2016-11-18 DIAGNOSIS — Z79899 Other long term (current) drug therapy: Secondary | ICD-10-CM | POA: Diagnosis not present

## 2016-11-18 DIAGNOSIS — Z6829 Body mass index (BMI) 29.0-29.9, adult: Secondary | ICD-10-CM | POA: Diagnosis not present

## 2016-12-02 ENCOUNTER — Emergency Department (HOSPITAL_COMMUNITY): Payer: Medicare Other

## 2016-12-02 ENCOUNTER — Telehealth: Payer: Self-pay | Admitting: Pulmonary Disease

## 2016-12-02 ENCOUNTER — Emergency Department (HOSPITAL_COMMUNITY)
Admission: EM | Admit: 2016-12-02 | Discharge: 2016-12-02 | Disposition: A | Discharge status: Home / Self Care | Payer: Medicare Other | Attending: Emergency Medicine | Admitting: Emergency Medicine

## 2016-12-02 ENCOUNTER — Ambulatory Visit: Payer: BLUE CROSS/BLUE SHIELD | Admitting: Pulmonary Disease

## 2016-12-02 ENCOUNTER — Encounter (HOSPITAL_COMMUNITY): Payer: Self-pay | Admitting: Emergency Medicine

## 2016-12-02 DIAGNOSIS — R9389 Abnormal findings on diagnostic imaging of other specified body structures: Secondary | ICD-10-CM

## 2016-12-02 DIAGNOSIS — S2242XA Multiple fractures of ribs, left side, initial encounter for closed fracture: Secondary | ICD-10-CM

## 2016-12-02 DIAGNOSIS — S3993XA Unspecified injury of pelvis, initial encounter: Secondary | ICD-10-CM | POA: Diagnosis not present

## 2016-12-02 DIAGNOSIS — Y92002 Bathroom of unspecified non-institutional (private) residence single-family (private) house as the place of occurrence of the external cause: Secondary | ICD-10-CM | POA: Diagnosis not present

## 2016-12-02 DIAGNOSIS — W2209XA Striking against other stationary object, initial encounter: Secondary | ICD-10-CM | POA: Diagnosis not present

## 2016-12-02 DIAGNOSIS — E860 Dehydration: Secondary | ICD-10-CM

## 2016-12-02 DIAGNOSIS — Y999 Unspecified external cause status: Secondary | ICD-10-CM | POA: Diagnosis not present

## 2016-12-02 DIAGNOSIS — S2232XA Fracture of one rib, left side, initial encounter for closed fracture: Secondary | ICD-10-CM | POA: Diagnosis not present

## 2016-12-02 DIAGNOSIS — Z79899 Other long term (current) drug therapy: Secondary | ICD-10-CM | POA: Diagnosis not present

## 2016-12-02 DIAGNOSIS — Z87891 Personal history of nicotine dependence: Secondary | ICD-10-CM | POA: Diagnosis not present

## 2016-12-02 DIAGNOSIS — S3991XA Unspecified injury of abdomen, initial encounter: Secondary | ICD-10-CM | POA: Diagnosis not present

## 2016-12-02 DIAGNOSIS — R55 Syncope and collapse: Secondary | ICD-10-CM

## 2016-12-02 DIAGNOSIS — Z7982 Long term (current) use of aspirin: Secondary | ICD-10-CM | POA: Diagnosis not present

## 2016-12-02 DIAGNOSIS — R938 Abnormal findings on diagnostic imaging of other specified body structures: Secondary | ICD-10-CM | POA: Diagnosis not present

## 2016-12-02 DIAGNOSIS — R1012 Left upper quadrant pain: Secondary | ICD-10-CM | POA: Insufficient documentation

## 2016-12-02 DIAGNOSIS — Y9301 Activity, walking, marching and hiking: Secondary | ICD-10-CM | POA: Diagnosis not present

## 2016-12-02 DIAGNOSIS — S299XXA Unspecified injury of thorax, initial encounter: Secondary | ICD-10-CM | POA: Diagnosis not present

## 2016-12-02 LAB — URINALYSIS, ROUTINE W REFLEX MICROSCOPIC
Bilirubin Urine: NEGATIVE
GLUCOSE, UA: NEGATIVE mg/dL
HGB URINE DIPSTICK: NEGATIVE
Ketones, ur: 5 mg/dL — AB
LEUKOCYTES UA: NEGATIVE
Nitrite: NEGATIVE
PH: 5 (ref 5.0–8.0)
Protein, ur: NEGATIVE mg/dL
Specific Gravity, Urine: 1.024 (ref 1.005–1.030)

## 2016-12-02 LAB — CBG MONITORING, ED: Glucose-Capillary: 93 mg/dL (ref 65–99)

## 2016-12-02 LAB — CBC
HEMATOCRIT: 43.7 % (ref 39.0–52.0)
Hemoglobin: 15 g/dL (ref 13.0–17.0)
MCH: 29.9 pg (ref 26.0–34.0)
MCHC: 34.3 g/dL (ref 30.0–36.0)
MCV: 87.1 fL (ref 78.0–100.0)
Platelets: 215 10*3/uL (ref 150–400)
RBC: 5.02 MIL/uL (ref 4.22–5.81)
RDW: 14.8 % (ref 11.5–15.5)
WBC: 14.9 10*3/uL — AB (ref 4.0–10.5)

## 2016-12-02 LAB — BASIC METABOLIC PANEL
Anion gap: 9 (ref 5–15)
BUN: 21 mg/dL — ABNORMAL HIGH (ref 6–20)
CHLORIDE: 105 mmol/L (ref 101–111)
CO2: 27 mmol/L (ref 22–32)
Calcium: 9.9 mg/dL (ref 8.9–10.3)
Creatinine, Ser: 1.32 mg/dL — ABNORMAL HIGH (ref 0.61–1.24)
GFR calc Af Amer: >60 mL/min (ref >60)
GFR calc non Af Amer: 55 mL/min — ABNORMAL LOW (ref >60)
Glucose, Bld: 101 mg/dL — ABNORMAL HIGH (ref 65–99)
POTASSIUM: 4.8 mmol/L (ref 3.5–5.1)
SODIUM: 141 mmol/L (ref 135–145)

## 2016-12-02 LAB — POCT I-STAT TROPONIN I: Troponin i, poc: 0 ng/mL (ref 0.00–0.08)

## 2016-12-02 MED ORDER — SODIUM CHLORIDE 0.9 % IV BOLUS (SEPSIS)
1000.0000 mL | Freq: Once | INTRAVENOUS | Status: AC
  Administered 2016-12-02: 1000 mL via INTRAVENOUS

## 2016-12-02 MED ORDER — IOPAMIDOL (ISOVUE-370) INJECTION 76%
INTRAVENOUS | Status: AC
  Filled 2016-12-02: qty 100

## 2016-12-02 MED ORDER — IOPAMIDOL (ISOVUE-370) INJECTION 76%
100.0000 mL | Freq: Once | INTRAVENOUS | Status: AC | PRN
  Administered 2016-12-02: 100 mL via INTRAVENOUS

## 2016-12-02 MED ORDER — MORPHINE SULFATE (PF) 4 MG/ML IV SOLN
4.0000 mg | Freq: Once | INTRAVENOUS | Status: AC
  Administered 2016-12-02: 4 mg via INTRAVENOUS
  Filled 2016-12-02: qty 1

## 2016-12-02 MED ORDER — ONDANSETRON HCL 4 MG/2ML IJ SOLN
4.0000 mg | Freq: Once | INTRAMUSCULAR | Status: DC
  Filled 2016-12-02: qty 2

## 2016-12-02 MED ORDER — TRAMADOL HCL 50 MG PO TABS: 50.0000 mg | ORAL_TABLET | Freq: Four times a day (QID) | ORAL | 0 refills | Status: DC | PRN

## 2016-12-02 NOTE — Discharge Instructions (Signed)
Please use incentive spirometer several times each hour for the next week to decrease risk of developing pneumonia.  Take tylenol or ibuprofen as needed. Take tramadol if pain is more intense.  Follow up with your doctor to have a repeat chest CT in 3 months for further assessment of a 22mm nodule that you have in your left lower lung.  Return if you have any concerns.  Stay hydrated and get plenty of rest.

## 2016-12-02 NOTE — ED Triage Notes (Signed)
Pt verbalizes passed out en route to restroom around 0700 this morning; complaint of diaphoresis of left ribcage pain post hitting toilet. Pt denies head injury. Pt verbalizes taking aspirin every 2-3 days.

## 2016-12-02 NOTE — Telephone Encounter (Signed)
Patient called again. He very concerned and request to see Dr. Lenna Gilford. Patient is scheduled with Dr. Lenna Gilford today 12/02/16 at 9:30.

## 2016-12-02 NOTE — Telephone Encounter (Signed)
I have made Leigh aware. We agreed that the patient should have gone to the ED.  Marliss Czar is going to talk to Dr Lenna Gilford as the patient is most likely on his way here for 9:30 appt-- unable to get in touch with him.  Marliss Czar is going to talk to Dr Lenna Gilford. Nothing further needed.

## 2016-12-02 NOTE — ED Provider Notes (Signed)
[facility] DEPT Provider Note   CSN: [id number] Arrival date & time: 12/02/16  0947     History   Chief Complaint Chief Complaint  Patient presents with  . Near Syncope    HPI William West is a 66 y.o. male.  HPI   66 year old male with history of atrial fibrillation currently not on any anticoagulants, GI bleed, rheumatoid arthritis presenting for a fall. Patient report this morning around 7 AM he woke up to use the bathroom. As he walked towards the bathroom he felt lightheadedness and before he could sit down, patient fell forward and struck the left side of his chest against the commode. Report acute onset of sharp pain to the affected site, severe, and he broke out in sweats. It took him approximately 5 minutes to get up. He denies hitting his head or loss of consciousness. He is having increasing pain with deep breathing, coughing or sneezing. He denies having headache, neck pain, exertional chest pain, increased shortness of breath, back pain, hematuria, focal numbness or weakness. He does take a baby aspirin every 3 days. He denies any specific treatment tried. He denies history of recurrent falls. He is generally active and exercise regularly. He is not on any anticoagulant. Denies any recent medication change except for being on Protonix for the past several months. He denies heart palpitation.  Past Medical History:  Diagnosis Date  . A-fib ([place])   . Acute arthritis   . Allergic rhinitis   . Atrial fibrillation ([place])    history of a fib  . Dermatitis   . Diverticulosis of colon   . Dysplastic nevus   . GERD (gastroesophageal reflux disease)   . GI bleeding 01/24/2015  . Hypercholesterolemia   . Mononucleosis   . RA (rheumatoid arthritis) ([place])   . Tobacco use disorder     Patient Active Problem List   Diagnosis Date Noted  . Acute upper respiratory infection 04/01/2016  . Acute bronchitis 12/24/2015  . Acute blood loss anemia 01/26/2015  . GI bleeding  01/24/2015  . Ex-cigarette smoker 01/16/2015  . Abdominal pain, epigastric 07/02/2014  . Nausea and vomiting 07/02/2014  . Atrial fibrillation ([place]) 04/25/2012  . Annual physical exam 01/06/2011  . Rheumatoid arthritis ([place]) 01/06/2011  . ALLERGIC RHINITIS 04/22/2009  . Hypercholesteremia 10/24/2008  . DERMATITIS 10/11/2007  . MONONUCLEOSIS 06/21/2007  . Benign neoplasm of skin 06/21/2007  . Esophageal reflux 06/21/2007  . Diverticulosis of large intestine 06/21/2007    Past Surgical History:  Procedure Laterality Date  . CHOLECYSTECTOMY  2011   laparoscopic  . COLONOSCOPY  01/24/2015   x 3 in past  . dysplastic nevus  07/1991   excised from upper back  . ERCP N/A 02/18/2015   Procedure: ENDOSCOPIC RETROGRADE CHOLANGIOPANCREATOGRAPHY (ERCP);  Surgeon: Carol Ada, MD;  Location: [facility];  Service: Endoscopy;  Laterality: N/A;  . GIVENS CAPSULE STUDY  01/24/2015  . GIVENS CAPSULE STUDY N/A 01/24/2015   Procedure: GIVENS CAPSULE STUDY;  Surgeon: Juanita Craver, MD;  Location: [facility];  Service: Endoscopy;  Laterality: N/A;  . L foot injury  age 32   w/ part amputation great toe  . TONSILLECTOMY AND ADENOIDECTOMY  as a child       Home Medications    Prior to Admission medications   Medication Sig Start Date End Date Taking? Authorizing Provider  Ascorbic Acid (VITAMIN C) 500 MG tablet Take 500 mg by mouth every morning. once daily with rose hips    [provider]  aspirin 81 MG tablet Take 81 mg by mouth daily.    [provider]  azithromycin (ZITHROMAX) 250 MG tablet Take as directed 04/19/16   Noralee Space, MD  Cholecalciferol (VITAMIN D3) 2000 UNITS CHEW Chew 1 tablet by mouth daily.    [provider]  diltiazem (CARDIZEM CD) 120 MG 24 hr capsule TAKE 1 CAPSULE (120 MG TOTAL) BY MOUTH DAILY. 08/30/16   Evans Lance, MD  esomeprazole (NEXIUM) 40 MG capsule TAKE 1 CAPSULE (40 MG TOTAL) BY MOUTH DAILY. 09/15/15   Noralee Space, MD    etanercept (ENBREL) 50 MG/ML injection Inject 50 mg into the skin once a week. On mondays    [provider]  folic acid (FOLVITE) 622 MCG tablet Take 800 mcg by mouth daily.      [provider]  LORazepam (ATIVAN) 1 MG tablet TAKE 1/2 TO 1 TABLET 3 TIMES DAILY AS NEEDED 01/01/16   Noralee Space, MD  Magnesium 250 MG TABS Take 250 mg by mouth every other day.    [provider]  Melatonin 2.5 MG CHEW Prn at bedtime    [provider]  Multiple Vitamin (MULTIVITAMIN WITH MINERALS) TABS tablet Take 1 tablet by mouth daily.    [provider]  oseltamivir (TAMIFLU) 75 MG capsule Take 1 capsule (75 mg total) by mouth 2 (two) times daily. 07/07/16   Noralee Space, MD    Family History Family History  Problem Relation Age of Onset  . Prostate cancer Father   . Other Father        pacemaker  . Heart disease Mother   . Other Mother        vascular disease    Social History Social History  Substance Use Topics  . Smoking status: Former Smoker    Packs/day: 0.50    Years: 45.00    Types: Cigarettes    Quit date: 11/13/2009  . Smokeless tobacco: Never Used  . Alcohol use No     Allergies   Caffeine; Flecainide; Methotrexate derivatives; Pseudoephedrine-ibuprofen; and Penicillins   Review of Systems Review of Systems  All other systems reviewed and are negative.    Physical Exam Updated Vital Signs BP 117/74   Pulse 70   Temp (!) 97.5 F (36.4 C) (Oral)   Resp 15   SpO2 99%   Physical Exam  Constitutional: He is oriented to person, place, and time. He appears well-developed and well-nourished. No distress.  HENT:  Head: Normocephalic and atraumatic.  Eyes: Pupils are equal, round, and reactive to light. Conjunctivae and EOM are normal.  Neck: Neck supple.  Neck is supple without midline spine tenderness  Cardiovascular: Normal rate, regular rhythm, normal heart sounds and intact distal pulses.   Pulmonary/Chest: Effort  normal and breath sounds normal. He exhibits tenderness (Exquisite tenderness to left lower anterior chest on palpation without crepitus, or emphysema.).  Abdominal: Soft. There is tenderness (Tenderness to left upper quadrant on palpation. No ecchymosis.).  Musculoskeletal:  No tenderness to all 4 extremities. 5 out 5 strength to all 4 extremities  Neurological: He is alert and oriented to person, place, and time. No cranial nerve deficit or sensory deficit. GCS eye subscore is 4. GCS verbal subscore is 5. GCS motor subscore is 6.  Skin: No rash noted.  Psychiatric: He has a normal mood and affect.  Nursing note and vitals reviewed.    ED Treatments / Results  Labs (all labs ordered are  listed, but only abnormal results are displayed) Labs Reviewed  BASIC METABOLIC PANEL - Abnormal; Notable for the following:       Result Value   Glucose, Bld 101 (*)    BUN 21 (*)    Creatinine, Ser 1.32 (*)    GFR calc non Af Amer 55 (*)    All other components within normal limits  CBC - Abnormal; Notable for the following:    WBC 14.9 (*)    All other components within normal limits  URINALYSIS, ROUTINE W REFLEX MICROSCOPIC - Abnormal; Notable for the following:    Ketones, ur 5 (*)    All other components within normal limits  CBG MONITORING, ED  I-STAT TROPOININ, ED  POCT I-STAT TROPONIN I    EKG  EKG Interpretation None      Date: 12/02/2016  Rate: 80  Rhythm: normal sinus rhythm  QRS Axis: normal  Intervals: normal  ST/T Wave abnormalities: normal  Conduction Disutrbances: none  Narrative Interpretation:   Old EKG Reviewed: No significant changes noted     Radiology Dg Chest 2 View  Result Date: 12/02/2016 CLINICAL DATA:  Rib cage pain.  Fall EXAM: CHEST  2 VIEW COMPARISON:  01/21/2016 FINDINGS: Mild bibasilar atelectasis. Negative for pneumonia. Negative for effusion or pneumothorax. Mildly displaced left lower anterior rib fractures. Heart size and vascularity normal.  IMPRESSION: Mild bibasilar atelectasis. Left lower anterior rib fractures Electronically Signed   By: Franchot Gallo M.D.   On: 12/02/2016 11:31   Ct Angio Chest Pe W And/or Wo Contrast  Result Date: 12/02/2016 CLINICAL DATA:  Near syncope. Fall. Evaluate for pulmonary embolism. EXAM: CT ANGIOGRAPHY CHEST, ABDOMEN AND PELVIS TECHNIQUE: Multidetector CT imaging through the chest, abdomen and pelvis was performed using the standard protocol during bolus administration of intravenous contrast. Multiplanar reconstructed images and MIPs were obtained and reviewed to evaluate the vascular anatomy. CONTRAST:  100 cc Isovue 370 intravenous COMPARISON:  None. FINDINGS: CTA CHEST FINDINGS Cardiovascular: Satisfactory opacification of the pulmonary arteries to the segmental level. No evidence of pulmonary embolism. Normal heart size. No pericardial effusion. Left coronary atherosclerotic calcification. Aortic atherosclerosis without dissection. Mediastinum/Nodes: Symmetric prominence of hilar lymph nodes, measuring up to 9 mm. Subcarinal lymph node is also prominent in size, 16 mm in short axis. Lungs/Pleura: There is a lobulated pulmonary nodule in these apical segment left lower lobe measuring 9 mm. Irregularly marginated cyst in the upper lingula, measuring 1 cm. There is no edema, consolidation, effusion, or pneumothorax. Musculoskeletal: Nondisplaced lateral left tenth and eleventh rib fractures. Review of the MIP images confirms the above findings. CTA ABDOMEN AND PELVIS FINDINGS Due to IV failure, CTA of the abdomen is instead in the early portal venous phase. There is still good opacification of arteries. Celiac branches were well seen on the chest CTA study. VASCULAR Aorta: Mild atherosclerotic calcification/plaque. Negative for aneurysm or dissection. Celiac: Patent without evidence of aneurysm, dissection, vasculitis or significant stenosis. Mild atherosclerotic plaque at the ostium. SMA: Patent without evidence  of aneurysm, dissection, vasculitis or significant stenosis. Renals: Single bilateral that are smooth and widely patent. Mild atherosclerotic calcification on the left without stenosis. IMA: Patent Inflow: Mild atherosclerotic plaque at the proximal common iliacs. No stenosis or dissection. Veins: Negative Review of the MIP images confirms the above findings. NON-VASCULAR Hepatobiliary: No focal liver abnormality.Cholecystectomy which may account for intrahepatic and extrahepatic bowel duct prominence. No obstructive process is seen. Pancreas: Unremarkable. Spleen: Unremarkable. Adrenals/Urinary Tract: Negative adrenals. No hydronephrosis or stone. Unremarkable  bladder. Stomach/Bowel: No obstruction. No appendicitis. Moderate sigmoid diverticulosis. Vascular/Lymphatic: No acute vascular abnormality. No mass or adenopathy. Reproductive:Nonspecific prostate calcifications. Other: No ascites or pneumoperitoneum. Fatty left inguinal hernia. Two calcifications in the recto prostatic recess may be dropped gallstones. No associated inflammation. Musculoskeletal: No acute abnormalities. Review of the MIP images confirms the above findings. IMPRESSION: Chest CTA: 1. Nondisplaced left tenth and eleventh rib fractures. 2. Negative for pulmonary embolism. 3. 9 mm left lower lobe pulmonary nodule. Margins are lobulated and this could be an early neoplasm. Recommend repeat chest CT in 3 months. This recommendation follows the consensus statement: Guidelines for Management of Incidental Pulmonary Nodules Detected on CT Images: From the Fleischner Society 2017; Radiology 2017; 284:228-243. 4. Mild hilar and mediastinal node enlargement. 1 cm irregularly-shaped cyst in the lingula. Attention on follow-up. 5. Aortic Atherosclerosis (ICD10-I70.0). Left coronary atherosclerotic calcification Abdominal CTA 1. Suboptimal due to IV failure, but still diagnostic. 2. No acute finding. Aortic atherosclerosis without aneurysm or dissection.  3. Moderate sigmoid diverticulosis. 4. Fatty left inguinal hernia. 5. Bile duct dilatation after cholecystectomy. Please correlate with liver function tests. Electronically Signed   By: Monte Fantasia M.D.   On: 12/02/2016 14:31   Ct Angio Abd/pel W And/or Wo Contrast  Result Date: 12/02/2016 CLINICAL DATA:  Near syncope. Fall. Evaluate for pulmonary embolism. EXAM: CT ANGIOGRAPHY CHEST, ABDOMEN AND PELVIS TECHNIQUE: Multidetector CT imaging through the chest, abdomen and pelvis was performed using the standard protocol during bolus administration of intravenous contrast. Multiplanar reconstructed images and MIPs were obtained and reviewed to evaluate the vascular anatomy. CONTRAST:  100 cc Isovue 370 intravenous COMPARISON:  None. FINDINGS: CTA CHEST FINDINGS Cardiovascular: Satisfactory opacification of the pulmonary arteries to the segmental level. No evidence of pulmonary embolism. Normal heart size. No pericardial effusion. Left coronary atherosclerotic calcification. Aortic atherosclerosis without dissection. Mediastinum/Nodes: Symmetric prominence of hilar lymph nodes, measuring up to 9 mm. Subcarinal lymph node is also prominent in size, 16 mm in short axis. Lungs/Pleura: There is a lobulated pulmonary nodule in these apical segment left lower lobe measuring 9 mm. Irregularly marginated cyst in the upper lingula, measuring 1 cm. There is no edema, consolidation, effusion, or pneumothorax. Musculoskeletal: Nondisplaced lateral left tenth and eleventh rib fractures. Review of the MIP images confirms the above findings. CTA ABDOMEN AND PELVIS FINDINGS Due to IV failure, CTA of the abdomen is instead in the early portal venous phase. There is still good opacification of arteries. Celiac branches were well seen on the chest CTA study. VASCULAR Aorta: Mild atherosclerotic calcification/plaque. Negative for aneurysm or dissection. Celiac: Patent without evidence of aneurysm, dissection, vasculitis or  significant stenosis. Mild atherosclerotic plaque at the ostium. SMA: Patent without evidence of aneurysm, dissection, vasculitis or significant stenosis. Renals: Single bilateral that are smooth and widely patent. Mild atherosclerotic calcification on the left without stenosis. IMA: Patent Inflow: Mild atherosclerotic plaque at the proximal common iliacs. No stenosis or dissection. Veins: Negative Review of the MIP images confirms the above findings. NON-VASCULAR Hepatobiliary: No focal liver abnormality.Cholecystectomy which may account for intrahepatic and extrahepatic bowel duct prominence. No obstructive process is seen. Pancreas: Unremarkable. Spleen: Unremarkable. Adrenals/Urinary Tract: Negative adrenals. No hydronephrosis or stone. Unremarkable bladder. Stomach/Bowel: No obstruction. No appendicitis. Moderate sigmoid diverticulosis. Vascular/Lymphatic: No acute vascular abnormality. No mass or adenopathy. Reproductive:Nonspecific prostate calcifications. Other: No ascites or pneumoperitoneum. Fatty left inguinal hernia. Two calcifications in the recto prostatic recess may be dropped gallstones. No associated inflammation. Musculoskeletal: No acute abnormalities. Review of the MIP images  confirms the above findings. IMPRESSION: Chest CTA: 1. Nondisplaced left tenth and eleventh rib fractures. 2. Negative for pulmonary embolism. 3. 9 mm left lower lobe pulmonary nodule. Margins are lobulated and this could be an early neoplasm. Recommend repeat chest CT in 3 months. This recommendation follows the consensus statement: Guidelines for Management of Incidental Pulmonary Nodules Detected on CT Images: From the Fleischner Society 2017; Radiology 2017; 284:228-243. 4. Mild hilar and mediastinal node enlargement. 1 cm irregularly-shaped cyst in the lingula. Attention on follow-up. 5. Aortic Atherosclerosis (ICD10-I70.0). Left coronary atherosclerotic calcification Abdominal CTA 1. Suboptimal due to IV failure, but  still diagnostic. 2. No acute finding. Aortic atherosclerosis without aneurysm or dissection. 3. Moderate sigmoid diverticulosis. 4. Fatty left inguinal hernia. 5. Bile duct dilatation after cholecystectomy. Please correlate with liver function tests. Electronically Signed   By: Monte Fantasia M.D.   On: 12/02/2016 14:31    Procedures Procedures (including critical care time)  Medications Ordered in ED Medications  iopamidol (ISOVUE-370) 76 % injection (not administered)  ondansetron (ZOFRAN) injection 4 mg (4 mg Intravenous Not Given 12/02/16 1405)  sodium chloride 0.9 % bolus 1,000 mL (0 mLs Intravenous Stopped 12/02/16 1358)  morphine 4 MG/ML injection 4 mg (4 mg Intravenous Given 12/02/16 1222)  iopamidol (ISOVUE-370) 76 % injection 100 mL (100 mLs Intravenous Contrast Given 12/02/16 1351)     Initial Impression / Assessment and Plan / ED Course  I have reviewed the triage vital signs and the nursing notes.  Pertinent labs & imaging results that were available during my care of the patient were reviewed by me and considered in my medical decision making (see chart for details).     BP 118/68   Pulse 79   Temp (!) 97.5 F (36.4 C) (Oral)   Resp 11   SpO2 97%    Final Clinical Impressions(s) / ED Diagnoses   Final diagnoses:  Near syncope  Dehydration  Closed fracture of multiple ribs of left side, initial encounter  Abnormal chest CT    New Prescriptions New Prescriptions   TRAMADOL (ULTRAM) 50 MG TABLET    Take 1 tablet (50 mg total) by mouth every 6 (six) hours as needed for moderate pain or severe pain.   12:17 PM Patient fell in bathroom earlier today and injured his left side of chest. His chest wall pain is reproducible on exam. X-ray demonstrate left lower anterior rib fracture. No evidence of effusion or pneumothorax. He did report feeling lightheadedness prior to the fall. Workup initiated. Pain medication and IV fluid given. May consider CT scan to assess  injury, specifically to ensure no internal organ damage.  12:28 PM Pt has positive orthostasis, likely the cause of his fall. IVF given Orthostatic Lying   BP- Lying: 116/73  Pulse- Lying: 66      Orthostatic Sitting  BP- Sitting: 113/81  Pulse- Sitting: 75      Orthostatic Standing at 0 minutes  BP- Standing at 0 minutes: 118/77  Pulse- Standing at 0 minutes: 87   3:33 PM CT of chest abdomen pelvis show no evidence of PE. Patient does have nondisplaced left 10th and 11th ribs without any pneumothorax. Incidental 9 mm left lower lobe pulmonary nodule noted. I encouraged patient to have a repeat CT scan in 3 months for a recheck. I also encouraged patient to use incentive spirometer to help decrease complication of pneumonia from ribs fracture.  Evidence of dehydration as evidenced by an elevated creatinine of 1.32. Mildly elevated white blood  cells of 14.9 likely reactive. I discussed this finding with patient. He feels comfortable going home. He is able to ambulate. He will try to stay hydrated.  Return precaution discussed.  Care discussed with Dr. Ellender Hose.   Domenic Moras, PA-C 12/02/16 1541    Duffy Bruce, MD 12/03/16 [phone]

## 2016-12-09 ENCOUNTER — Ambulatory Visit (INDEPENDENT_AMBULATORY_CARE_PROVIDER_SITE_OTHER): Payer: Medicare Other | Admitting: Pulmonary Disease

## 2016-12-09 VITALS — BP 118/70 | HR 80 | Temp 97.5°F | Ht 72.0 in | Wt 212.1 lb

## 2016-12-09 DIAGNOSIS — R911 Solitary pulmonary nodule: Secondary | ICD-10-CM | POA: Insufficient documentation

## 2016-12-09 DIAGNOSIS — R42 Dizziness and giddiness: Secondary | ICD-10-CM | POA: Diagnosis not present

## 2016-12-09 DIAGNOSIS — M069 Rheumatoid arthritis, unspecified: Secondary | ICD-10-CM | POA: Diagnosis not present

## 2016-12-09 DIAGNOSIS — I480 Paroxysmal atrial fibrillation: Secondary | ICD-10-CM

## 2016-12-09 DIAGNOSIS — R55 Syncope and collapse: Secondary | ICD-10-CM

## 2016-12-09 DIAGNOSIS — S2242XA Multiple fractures of ribs, left side, initial encounter for closed fracture: Secondary | ICD-10-CM

## 2016-12-09 DIAGNOSIS — S2239XA Fracture of one rib, unspecified side, initial encounter for closed fracture: Secondary | ICD-10-CM | POA: Insufficient documentation

## 2016-12-09 HISTORY — DX: Dizziness and giddiness: R55

## 2016-12-09 HISTORY — DX: Dizziness and giddiness: R42

## 2016-12-09 NOTE — Patient Instructions (Signed)
Today we updated your med list in our EPIC system...    Continue your current medications the same...  Be sure to stay well hydrated drinking plenty of water, gatorade, etc this hot summer...  Call for any questions...  Let's plan a follow up visit in about 4mo for your physical & we will recheck your CT Chest after that.Marland KitchenMarland Kitchen

## 2016-12-10 ENCOUNTER — Encounter: Payer: Self-pay | Admitting: Pulmonary Disease

## 2016-12-10 NOTE — Progress Notes (Signed)
Subjective:    Patient ID: William West, male    DOB: [birth year], 66 y.o.   MRN: [id number]  HPI 66 y/o WM, husb of William West, here for a follow up visit & CPX... ~  SEE PREV EPIC NOTES FOR OLDER DATA >>    CXR 8/13 showed normal heart size, clear lungs, low lung vols & sl elev of right hemidiaph, s/p GB...  EKG 8/13 showed NSR, rate62, wnl, NAD...  LABS 8/13:  FLP- ok x LDL=120 on diet alone;  Chems- wnl;  CBC- wnl;  TSH=1.81;  PSA=0.62   LABS 8/14:  FLP- at goals on diet x LDL=109;  Chems- wnl;  CBC- wnl;  TSH=1.90;  PSA=0.67...  ~  January 14, 2014:  Yearly ROV & CPX> [name] reports a good yr overall; he continues to see William West for RA on Enbrel & MTX every 3-73mofor labs and OVs (we do not have recent notes from them);  He notes sl cough w/ greenish sput in the AMs but no f/c/s, chest discomfort, or SOB; we discussed ZPak Mucinex, Fluids for prn use;  He also had a bout of diarrhea 6/15=> went to ER w/ neg stoll studies, treated empirically w/ Cipro/Flagyl & symptoms resolved...     Ex-smoker> he quit in 2011 & has remained off cigs; breathing is good he says, no issues; exerc w/ yard work, splits wood, etc; CXR is clear...    PAF> on Diltiazem120 & Flecainide prn per William West "pill in the pocket" regimen (see below); he has used this med on only one occas during the last yr he says...    Chol> on diet alone & wt=230# (unchanged); FLP is stable w/ TChol 161, TG 117, HDL 37, LDL 100; we reviewed diet & wt reduction strategies...    Dyspepsia> on Prilosec '20mg'$ /d & this helps to control his symptoms; he saw William West 11/12 at [facility] for RUQ pain after lifting- CT was neg & the pain was felt to be musculoskeletal pain; s/p lap chole 7/11 for stones...    [facility]; [facility] & f/u should be 7-172yr..    RA> followed by William Salenvery 3-27m17mo Enbrel & MTX (currently 4/wk)- this has really helped & doing very well on Rx; we do not have notes or labs  from Rheum...    Hx dysplastic nevus, fair skinned/ red hair> advised to get Derm eval at least yearly & he promises to do so... We reviewed prob list, meds, xrays and labs> see below for updates >>   CXR 8/15 showed norm heart size, clear lungs, NAD...  LABS 6/15 via ER>  Chems- wnl;  CBC- wnl   LABS 8/15:  FLP- ok on diet alone;  TSH= 1.86;  PSA= 0.87   ~  March 05, 2014:  6wk ROV & add-on appt requested for 5d hx pain, tenderness, discomfort in suprapubic area but denies dysuria, hematuria, foul smelling urine, etc;  He notes "pressure" but no burning, no flankl or back pain, etc and he notes urine stream appears ok w/ good force;  Initially he felt chilled, shakey, but didn't take temp- he notes that tylenol helped;  States he had similar episode 1-2 yrs ago & symptoms resolved w/ meds called in for him at that time...  Exam is essentially neg- except min tender over bladder, neg CVAT, prostate is 2+ and normal, stool heme neg... We decided to check UA (clear), and Labs (all wnl- chems, CBC, Sed); and  cover empirically w/ Fluids and cranberry juice, ok to continue Tylenol, we are holding antibiotics until C&S returns... We reviewed prob list, meds, xrays and labs> see below for updates >>   LABS 10/15:  Chems- wnl w/ Cr=1.1;  CBC- wnl w/ Hg=13.9 & WBC=7.7;  Sed=17;  UA- clear w/ C&S pending...   ~  July 02, 2014:  58moROV & add-on appt requested for Abd Pain> states this is going on for several months and seems progressive- now getting "attacks" 3 times per week; states the discomfort is similar to prev GB attacks (he had GB removed by William West in 2011); c/o lots of indigestion- notes some nausea & vomits on occas then feels better; says appetite is fair, eats ok w/o swallowing issues, then 30 mon later "it sits on my stomach"; he has noted white stool yest & states his urine has been darker; he has been on OTC Omeprazole20 w/o help he says... NOTE> he had recheck by William West for abd  pain 145yrfter GB surg- they did CT Abd and it was NEG (prev cholecystectomy, sm hepatic dome cyst, diverticulosis, NAD)...William KitchenMarland West  He has hx PAF & is followed by William West> last seen 11/15 & said to be doing well on the "pill in the pocket"strategy for control; rare palpit, no CP/ SOB; on ASA81, Diltiazem12058mam and Flecainide 100m15m needed for AFib (hasn't needed)...    He is followed by DrBeGlean West Rheum Q3-19mo>619moRA on Enbrel & MTX => we do not have notes from him... We reviewed prob list, meds, xrays and labs> see below for updates >>   LABS 2/16:  Chems- wnl x GOT=120 GPT=248 (these were prev wnl);  AlkPhos=103, Amylase=50 (27-131) & Lipase=36 (11-59);  CBC- wnl;  Sed=5...   Abd Ultrasound 2/16 showed absent GB, ducts ok, poor vis of liver & pancreas, elarged spleen, kidneys ok, no other signif abn noted... PLAN>> Hx progressive abd discomfort w/ "attacks" of pain & some n/v; Labs w/ abn GOT/GPT but norm AlkPhos, Amylase/Lipase, and Sed=5; Sonar w/ splenomegaly, poor vis of liver & pancreas; we discussed Rx w/ stronger PPI for now- Nexium40 bid and refer to GI for further evaluation (?CT Abd and EGD?) and rx... ADDENDUM>> Pt called 07/22/14 w/ update- now on Enbrel & off MTX, LFTs are normal, appetite is good & RA under control, on Nexium40 before dinner & this is helping he says...  ~  January 16, 2015:  19mo R5mo William Leamanre for his CPX> prev symptoms resolved and LFTs ret to normal off MTX;  Now feeling well, breathing is good w/o cough/ sput/ SOB etc... We reviewed the following medical problems during today's office visit >>     Ex-smoker> he quit in 2011 & has remained off cigs; breathing is good he says, no issues; exerc w/ yard work, splits wood, etc; CXR is clear...    PAF> on Diltiazem120 & off Flecainide (prn per William West "pill in the pocket" regimen); he has used this med on only one occas during the last few yrs.    Chol> on diet alone & wt=226#; FLP is stable w/ TChol 152, TG 88,  HDL 41, LDL 93; we reviewed diet & wt reduction strategies...    Dyspepsia> on Nexium 40mg/d58mhis helps to control his symptoms; he saw [facility] 11/12 at [facility] for RUQ pain after lifting- CT was neg & the pain was felt to be musculoskeletal pain; s/p lap chole 7/11 for stones; GI symptoms 2/16  resolved off MTX... ...    Divertics> [facility]; colon 2011 was neg & f/u should be 7-47yr...    RA> followed by DGlean Salenevery 3-647mon Enbrel & off MTX now- doing very well on monotherapy; we do not have notes or labs from Rheum...    Hx dysplastic nevus, fair skinned/ red hair> advised to get Derm eval at least yearly & he promises to do so... We reviewed prob list, meds, xrays and labs> see below for updates >> up to date on Immuniz...  CXR 8/16 showed norm heart size, clear lungs, NAD...   LABS 8/16:  FLP- at goals on diet alone;  Chems- wnl;  CBC- wnl;  TSH=1.63;  PSA=0.80;  Mg=2.1 (wnl)...   ~  December 24, 2015:  1y65yrV & add-on appt requested for URI symptoms> RanSimss a CPX sched for 49mo68mom now but called for this add-on appt due to a URI-- c/o sore throat, sinus drainage, cough, green sput, feeling bad, HA, ?temp 99/ +chills/ no sweats, grad onset over 1wk after exposure to grandkids... Meds include:  CardizemCD120, Nexium40, Ativan1mg 47m, and ENBREL50/wk per William West;  He is allergic to PCN... EXAM shows Afeb, VSS, O2sat=96% on RA;  HEENT- sl red, no exud, no adenopathy;  Chest- clear w/o w/r/r;  Heart- RR w/o m/r/g;  Abd- soft, nontender, neg;  Ext- neg w/o c/c/e;  Nuero- intact w/o focal abn...  CXR 12/24/15> norm heart size, calcif in Ao arch, clear lungs w/ sl prom interstitial markings in LLL, NAD...   LABS 12/24/15>  Chems- wnl, BS=105;  CBC- ok w/ Hg=13.1, WBC=11.9 w/ 73%segs;  TSH=1.93 IMP/PLAN>>  URI, acute bronchitis-- we discussed Rx w/ Depo120, Levaquin500 x7d, Mucinex600-2Bid + fluids, rec OTC Align/ Activia yogurt etc...   ~  January 21, 2016:  3wk ROV & pt is  here for his yearly ROV recheck>  Ronon's prev bronchitis has resolved, no GI issues, and back to baseline- doing satis w/o any new complaints or concerns; He is concerned because a friend was Dx w/ Lung CaWe reviewed the following medical problems during today's office visit >>     Ex-smoker> he quit in 2011 & has remained off cigs; breathing is good he says, no issues; exerc w/ yard work, splits wood, etc; acute bronchitis episode 12/2015- resolved w/ Levaquin; f/u CXR is clear...    PAF> on Diltiazem120 & off Flecainide (prn per William West- he takes extra Diltiazem "prn"); he has used this extra med several times over the last yr; he says that William West told him not to take ASA (?due to bleeding)...    Chol> on diet alone & wt=220#; FLP 8/17 is stable w/ TChol 141, TG 84, HDL 49, LDL 75; we reviewed diet & wt reduction strategies...    Dyspepsia> on Nexium 40mg/44mthis helps to control his symptoms; he saw [facility] 11/12 at [facility] for RUQ pain after lifting- CT was neg & the pain was felt to be musculoskeletal pain; s/p lap chole 7/11 for stones; GI symptoms 2/16 resolved off MTX... ...    [facility] 2011 was neg & f/u should be 7-21yrs.71yr Urology> he saw DrMcKenzie 08/2015 for blood in semen- cleared spont, no recurrence, UA was clear; hematospermia felt to be benign & f/u prn...    RA> followed by DrBeekmGlean Salen3-70mo on 70moel & off MTX now- doing very well on monotherapy; last note 11/19/15 reviewed- RA of mult sites, on  Enbrel, pos Rheum factor, doing satis,     Hx dysplastic nevus, fair skinned/ red hair> advised to get Derm eval at least yearly & he promises to do so... EXAM shows Afeb, VSS, O2sat=100% on RA;  HEENT- sl red, no exud, no adenopathy;  Chest- clear w/o w/r/r;  Heart- RR w/o m/r/g;  Abd- soft, nontender, neg;  Ext- neg w/o c/c/e;  Nuero- intact w/o focal abn...  CXR 01/21/16>  Norm heart size, Ao atherosclerosis, clear lungs, NAD...  LABS 01/21/16>   FLP- wnl on diet alone;  PSA=2.15 => NOTE: this is up from 0.80 last yr & we will recheck in 443mo IMP/PLAN>>  RJibranhas recovered from his bronchitis, other problems stable, note incr PSA velocity & we will recheck PSA in 671mo.  ~  April 19, 2016:  62m62moV & pulm recheck> RanChiokew SG on 04/01/17 for an acute upper resp infection- sore throat, post naasl drip, cough w/ sm amt yellow sput, exposed to grandchild w/ URI, he takes Enbrel for RA; treated w/ ZPak & also given Neosporin ophthalmic drops for pink-eye;  Asked to f/u w/ me today- improved & URI symptoms resolved...    Today RanGlennie c/o some dumping syndrome after eating, notes this is persistent after GB surg;  Also c/o right sided siatica and insomnia;  Notes considerable stress w/ leaves in his yard, prob w/ coyotes in his area (has a few farm animals/ cows; he needs to de-stress his life... Rec to use metamucil, offered Alpraz but he doesn't want new meds...    EXAM shows Afeb, VSS, O2sat=100% on RA;  HEENT- sl red, no exud, no adenopathy;  Chest- clear w/o w/r/r;  Heart- RR w/o m/r/g;  Abd- soft, nontender, neg;  Ext- neg w/o c/c/e;  Neuro- intact w/o focal abn... IMP/PLAN>>  Wofford's URI has resolved;  Other issues discussed- try Metamucil for dumping symptoms after eating, use Advil/ Tylenol/ heating pad for back discomfort w/ furthe eval id symptom persists; he has Melatonin and Ativan to help w/ rest/ stress... He'll be due for a follow up PSA in the spring...    ~  December 09, 2016:  43mo50mo & post- emergency room eval>  RandShlomot to the ER 12/02/16 after awakening ~7AM to go to the bathroom, felt lightheaded & before he could sit down he fell & struck his left chest on the commode w/ sharp left CP, felt weak w/ sweating, & it took him 5min29m get up; he did not hit his head or lose consciousness; no HA, neck pain, SOB, etc... ER eval revealed sl orthostatic, mild dehydration, left 10th/ 11th ant rib fxs; given IVF, Zofran, MS, and Tramadol  & asked to f/u here...  Since the Er visit he is feeling better, no longer postural/ weak/ dizzy/ etc;  We discussed his presentation & the findings (including incidental findings) & need for f/u as outlined below...     He last saw William West, CARDS-ep 04/29/16>  HBP, PAF, RA-- he uses Diltiazem120, CHADSVASC is zero, asked to f/u 23yr..66yr He continues to see William West regularly for Rheum>  RA- seropos on ENBREL; last note scanned into epic 02/2016 & he is asked to request notes be sent to us to [place] into computer;      EXAM shows Afeb, VSS, O2sat=100% on RA;  HEENT- sl red, no exud, no adenopathy;  Chest- clear w/o w/r/r;  Heart- RR w/o m/r/g;  Abd- soft, nontender, neg;  Ext- neg w/o  c/c/e;  Neuro- intact w/o focal abn..  CXR 12/02/16 (independently reviewed by me in the PACS system) showed norm heart size, mild basilar atx, no pneumonia, 2 lower left ant rib fxs...   EKG 12/02/16 showed NSR, rate 80, wnl, sl late transition...  CT Chest 12/02/16>  Norm heart size, NEG for PE, coronary & Ao atherosclerosis, +prom bilat hilar & subcarinal LNs (40m), 972mlobulated pulm nodule in LLL, irreg cyst in Lingula ~1cm, nondisplaced lateral 10th & 11th rib fxs...  CT Abd & Pelvis 12/02/16> mild atherosclerosis of Ao, patent celiac/ SMA/ renals/ IMA, no focal liver abn, s/p GB w/ mild bile duct dil, fatty left inguinal hernia, mod sigmoid divertics...   LABS 12/02/16>  Chems- ok x BUN=21, Cr=1.32 (baseline=1.07);  Troponins= zero;  CBC- ok w Hg=15.0 (baseline =13.1), wbc=14.9 -- Note: they have copies of labs & wife concerned about WBC=14.9 & trending up + mult incidental findings on CT scans =. We discussed in detail 7 promised to f/u... IMP/PLAN>>  Woodie's wife concerned he doesn't drink enough water/ gatorade & sweats a lot while workng outside etc; similarly she had mult questons about the incidental findings on his CT scans-- I reassured them as best I could & indicated that we would f/u CT Chest w/ contrast in  October to check the nodule & LNs... NOTE:  >50% of this 3026mrov was spent in counseling and coordination of care...          Problem List:     Ex-CIGARETTE SMOKER (ICD-305.1) - started as teen, up to 1/2 ppd, quit for up to 87yr46yrecently 1/2 ppd but he says he quit 6/11 & has remained quit!  No hx resp tract diseases...  ~  baseline CXRs showed clear, NAD... ~William West CXR 7/11 showed essent clear lungs, +gallstone seen in right abd... ~  CXR 8/13 showed normal heart size, clear lungs, low lung vols & sl elev of right hemidiaph, s/p GB... ~  CXR 8/15 showed norm heart size, clear lungs, NAD. ~  CXR 8/16 showed norm heart size, clear lungs, NAD  PAF >> managed by William West for Cards- first diagnosed 11/13 when he presented to ER w/ palpit; placed on CCB and converted to NSR... ~  He remains stable on Diltiazem 120 Qam and Flecainide "pill in the pocket" strategy ('100mg'$  prn palpit)... ~  EKG 11/15 showed NSR, rate75, NAD...   HYPERCHOLESTEROLEMIA, BORDERLINE (ICD-272.4) - currently on diet Rx alone. ~  FLP 1/09 showed TChol 198, TG 127, HDL 34, LDL 138 ~  FLP 6/10 showed TChol 170, TG 96, HDL 40, LDL 111 ~  FLP 6/11 showed TChol 184, TG 104, HDL 43, LDL 120... rec low chol/ low fat diet. ~  FLP 8/12 (wt=237#) showed TChol 183, TG 97, HDL 43, LDL 121... Not really on diet "I just eat" ~  FLP 8/13 (wt=235#) showed TChol 176, TG 78, HDL 41, LDL 120 ~  FLP 8/14 on diet alone (wt=234#) showed TChol 166, TG 78, HDL 41, LDL 109 ~  FLP 8/15 on diet alone (wt=230#) showed TChol 161, TG 117, HDL 37, LDL 100 ~  FLP 8/16 on diet alone (wt=226#) showed TChol 152, TG 88, HDL 41, LDL 93  DYSPEPSIA (ICD-536.8) - takes PRILOSEC OTC daily... no dysphagia, N/V, abd pain, etc... no prev endo etc... states he really needs it daily & has pain/ difficulty if he stops the Rx. ~  S/p lap chole 7/11 by William West for cholecystitis & stones... ~  2012:  he had recurrent abd pain & saw William West who did a CT  Abd&Pelvis- NEG, NAD (prev cholecystectomy, sm hepatic dome cyst, diverticulosis, NAD); pain felt to be musculoskeletal...  DIVERTICULOSIS OF COLON (ICD-562.10)  ~  He had colonoscopy 1/06 from DrMann= divertics, tiny polyp (path=norm mucosa), hems... ~  F/u colon 11/11 by DrNat-Mann w/ divertics, hems, sm rectal polyps= fragments of benign mucosa only...  ABDOMINAL PAIN & Hx ELEVATED HEPATOCELLULAR ENZYMES >> resolved off MTX ~  2/16: he presented w/ several month hx abd discomfort and seems progressive- now getting "attacks" 3 times per week; states the discomfort is similar to prev GB attacks (he had GB removed by William West in 2011); c/o lots of indigestion- notes some nausea & vomits on occas then feels better; says appetite is fair, eats ok w/o swallowing issues, then 30 mon later "it sits on my stomach"; he has noted white stool yest & states his urine has been darker; he has been on OTC Omeprazole20 w/o help he says... NOTE> he had recheck by William West for abd pain 46yrafter GB surg- they did CT Abd and it was NEG (prev cholecystectomy, sm hepatic dome cyst, diverticulosis, NAD)..William KitchenMarland KitchenWe placed him on Nexium40Bid, and referred him to GI for further eval & rx... ~  LABS 2/16 revealed> Chems- wnl x GOT=120 GPT=248 (these were prev wnl); AlkPhos=103, Amylase=50 (27-131) & Lipase=36 (11-59); CBC- wnl;  Sed=5...  ~  Abd Ultrasound 2/16 showed absent GB, ducts ok, poor vis of liver & pancreas, elarged spleen, kidneys ok, no other signif abn noted.. ~  All symptoms resolved off MTX & LFTs ret to normal...  Hx of MONONUCLEOSIS (ICD-075) - in high school... had mono hepatitis... no known sequellae... he also had RMSF at age 66 pretty sick, recovered w/o sequellae. ~  labs have consistently shown normal LFTs x during his cholecystitis presentation 7/11...  RHEUMATOID ARTHRITIS >> Diagnosed w/ sero-pos RA 10/11 & referred to Rheum, William West- on MTX currently 6/wk & occas Pred taper... ~  8/13:  He  tells me that DOlliestarted ENBREL about 6 weeks ago 7 he is already feeling better (we do not have notes from Rheum). ~  8/14:  followed by DGlean Salenon Enbrel & MTX (currently 4/wk)- this has really helped & doing very well on Rx... ~  He continues to f/u w/ Rheum every 3-447moor exam & labs; we [facility]... ~  Now on Enbrel alone, off MTX due to elev LFTs...  Hx of DYSPLASTIC NEVUS (ICD-216.9) - removed from base of neck/ upper back by DrHensel/ DrCrowe in 1993... ~  8/13:  He is reminded to check w/ Derm at least yearly due to his fair complexion, red hair, sun exposure, etc... ~  8/14:  He has not yet followed up w/ derm & promises to do so soon...  HEALTH MAINTENANCE:  on ASA '81mg'$  daily... ~  GI:  colonoscopy 1/06 & 11/11 by DrMann as above... ~  GU: neg DRE & PSA here remains wnl... ~  Immunizations:  age 89ow> exsmoker- given PNEUMOVAX 6/10;  given TETANUS shot 6/10; discussed yearly FLU vaccines.   Past Surgical History:  Procedure Laterality Date  . CHOLECYSTECTOMY  2011   laparoscopic  . COLONOSCOPY  01/24/2015   x 3 in past  . dysplastic nevus  07/1991   excised from upper back  . ERCP N/A 02/18/2015   Procedure: ENDOSCOPIC RETROGRADE CHOLANGIOPANCREATOGRAPHY (ERCP);  Surgeon: PaCarol AdaMD;  [facility];  Service: Endoscopy;  Laterality: N/A;  . GIVENS CAPSULE STUDY  01/24/2015  . GIVENS CAPSULE STUDY N/A 01/24/2015   Procedure: GIVENS CAPSULE STUDY;  Surgeon: Juanita Craver, MD;  Location: [facility];  Service: Endoscopy;  Laterality: N/A;  . L foot injury  age 32   w/ part amputation great toe  . TONSILLECTOMY AND ADENOIDECTOMY  as a child    Outpatient Encounter Prescriptions as of 12/09/2016  Medication Sig  . Ascorbic Acid (VITAMIN C) 500 MG tablet Take 500 mg by mouth every morning. once daily with rose hips  . aspirin 81 MG tablet Take 81 mg by mouth once a week.   . Cholecalciferol (VITAMIN D3) 2000 UNITS  CHEW Chew 1 tablet by mouth daily.  William West diltiazem (CARDIZEM CD) 120 MG 24 hr capsule TAKE 1 CAPSULE (120 MG TOTAL) BY MOUTH DAILY.  William West etanercept (ENBREL) 50 MG/ML injection Inject 50 mg into the skin once a week. On mondays  . LORazepam (ATIVAN) 1 MG tablet TAKE 1/2 TO 1 TABLET 3 TIMES DAILY AS NEEDED (Patient taking differently: Take 1 mg by mouth every 8 (eight) hours as needed for sleep. )  . Magnesium 250 MG TABS Take 250 mg by mouth every other day.  . Melatonin 2.5 MG CHEW Chew 1 tablet by mouth at bedtime as needed (sleep). Prn at bedtime   . Multiple Vitamin (MULTIVITAMIN WITH MINERALS) TABS tablet Take 1 tablet by mouth daily.  . pantoprazole (PROTONIX) 40 MG tablet Take 40 mg by mouth daily.   . traMADol (ULTRAM) 50 MG tablet Take 1 tablet (50 mg total) by mouth every 6 (six) hours as needed for moderate pain or severe pain.  . [DISCONTINUED] azithromycin (ZITHROMAX) 250 MG tablet Take as directed (Patient not taking: Reported on 12/09/2016)  . [DISCONTINUED] esomeprazole (NEXIUM) 40 MG capsule TAKE 1 CAPSULE (40 MG TOTAL) BY MOUTH DAILY. (Patient not taking: Reported on 12/02/2016)  . [DISCONTINUED] oseltamivir (TAMIFLU) 75 MG capsule Take 1 capsule (75 mg total) by mouth 2 (two) times daily. (Patient not taking: Reported on 12/02/2016)   No facility-administered encounter medications on file as of 12/09/2016.     Allergies  Allergen Reactions  . Caffeine Other (See Comments)    Causes him to go into afib  . Flecainide Other (See Comments)    Caused heart to race too fast and had him in the ED  . Methotrexate Derivatives Nausea And Vomiting    Increased liver enzymes  . Pseudoephedrine-Ibuprofen Other (See Comments)    " afib"   . Penicillins Rash    .William KitchenHas patient had a PCN reaction causing immediate rash, facial/tongue/throat swelling, SOB or lightheadedness with hypotension: No, YES TO RASH  Has patient had a PCN reaction causing severe rash involving mucus membranes or skin  necrosis: No Has patient had a PCN reaction that required hospitalization No Has patient had a PCN reaction occurring within the last 10 years: No If all of the above answers are "NO", then may proceed with Cephalosporin use.    Immunization History  Administered Date(s) Administered  . Influenza Split 02/22/2011, 03/14/2012, 03/16/2013, 02/08/2014, 02/22/2015  . Influenza,inj,Quad PF,36+ Mos 01/21/2016  . Pneumococcal Polysaccharide-23 10/24/2008  . Td 09/21/2001, 10/24/2008  . Tdap 03/07/2014    Current Medications, Allergies, Past Medical History, Past Surgical History, Family History, and Social History were reviewed in [employer] record.   Review of Systems        See HPI - all other systems neg  except as noted... Presents 2/16 c/o abd discomfort, intermit n/v, white stoools and dark urine...       The patient denies anorexia, fever, weight loss, weight gain, vision loss, decreased hearing, hoarseness, chest pain, syncope, dyspnea on exertion, peripheral edema, prolonged cough, headaches, hemoptysis, melena, hematochezia, hematuria, incontinence, muscle weakness, suspicious skin lesions, transient blindness, difficulty walking, depression, unusual weight change, abnormal bleeding, enlarged lymph nodes, and angioedema.   Prev c/o omplains of joint pain, joint swelling, loss of strength, and stiffness (all improved on MTX); denies joint redness, low back pain, mid back pain, muscle aches, cramps, and thoracic pain.   Objective:   Physical Exam     WD, Overweight, 66 y/o WM in NAD... GENERAL:  Alert & oriented; pleasant & cooperative... HEENT:  [place]/AT, EOM-wnl, PERRLA, Fundi-benign, EACs-clear, TMs-wnl, NOSE-clear, THROAT-clear & wnl. NECK:  Supple w/ full ROM; no JVD; normal carotid impulses w/o bruits; no thyromegaly or nodules palpated; no lymphadenopathy. CHEST:  Clear to P & A; without wheezes/ rales/ or rhonchi. HEART:  Regular Rhythm; without murmurs/  rubs/ or gallops. ABDOMEN:  Soft & non-tender; s/p lap chole, normal bowel sounds; no organomegaly or masses detected. RECTAL:  Neg - prostate 2+ & nontender w/o nodules; stool hematest neg EXT: deformity L great toe, sl swelling & tender MCPs, some IPs, & wrist; no varicose veins/ venous insuffic/ or edema. NEURO:  CN's intact; motor testing normal; sensory testing normal; gait normal & balance OK. DERM:  scar in upper back at base of neck toward R side, no new lesions noted...  RADIOLOGY DATA:  Reviewed in the EPIC EMR & discussed w/ the patient...  LABORATORY DATA:  Reviewed in the EPIC EMR & discussed w/ the patient...   Assessment & Plan:    Episode of near-syncope w/ fall & 2 left ant rib fxs 11/2016, assoc w/ orthostasis and dehydration=> resolved w/ conservative management...  12/24/15> URI/ Bronchitis-- Rx w/ Depo, Levaquin, Mucinex, align... 03/2016> similar symptoms resolved w/ ZPak... 11/2016>  LLL pulm nodule and mild hilar/subcarinal adenopathy on CT Chest done for fall w/ ant left CWP => f/u scan planned for Oct2018...  PAF>  New prob 11/13- followed by [facility] on Cardizem & Flecainide prn...  CHOL>  As noted w/ LDL down to 93 - needs better diet, wt reduction or consider low dose statin rx, he will decide...  Dyspepsia>  On Nexium daily...  Divertics/ sm polyps>  follwed by DrNat-Mann & up to date on colon screening etc...  Hx of abd discomfort assoc w/ "attacks" of pain, indigestion, & intermit n/v; Labs w/ elev GOT/GPT; Sonar w/ splenomegaly but poor vis of liver/pancreas; placed on [id number] & referred to GI for further eval &rx => all symptoms resolved off MTX and LFTs ret ro normal...  Urinary symptoms 10/15>> hx is somewhat vague & seems minor; min tender over bladder otherw norm exam; Labs & UA are clear/wnl; Rec Fluids, Cranberry juice, tylenol prn, & observe; symptoms resolved...  Sero-positive RA>  Followed by Glean West on ENBREL & now off MTX...  Other  medical problems as noted...  CPX>  He quit smoking 6/11;  Baseline EKG w/ NSR, NSSTTWA, NAD;  Baseline CXR = NAD; Labs look good as well...    Patient's Medications  New Prescriptions   No medications on file  Previous Medications   ASCORBIC ACID (VITAMIN C) 500 MG TABLET    Take 500 mg by mouth every morning. once daily with rose hips   ASPIRIN 81 MG TABLET  Take 81 mg by mouth once a week.    CHOLECALCIFEROL (VITAMIN D3) 2000 UNITS CHEW    Chew 1 tablet by mouth daily.   DILTIAZEM (CARDIZEM CD) 120 MG 24 HR CAPSULE    TAKE 1 CAPSULE (120 MG TOTAL) BY MOUTH DAILY.   ETANERCEPT (ENBREL) 50 MG/ML INJECTION    Inject 50 mg into the skin once a week. On mondays   LORAZEPAM (ATIVAN) 1 MG TABLET    TAKE 1/2 TO 1 TABLET 3 TIMES DAILY AS NEEDED   MAGNESIUM 250 MG TABS    Take 250 mg by mouth every other day.   MELATONIN 2.5 MG CHEW    Chew 1 tablet by mouth at bedtime as needed (sleep). Prn at bedtime    MULTIPLE VITAMIN (MULTIVITAMIN WITH MINERALS) TABS TABLET    Take 1 tablet by mouth daily.   PANTOPRAZOLE (PROTONIX) 40 MG TABLET    Take 40 mg by mouth daily.    TRAMADOL (ULTRAM) 50 MG TABLET    Take 1 tablet (50 mg total) by mouth every 6 (six) hours as needed for moderate pain or severe pain.  Modified Medications   No medications on file  Discontinued Medications   AZITHROMYCIN (ZITHROMAX) 250 MG TABLET    Take as directed   ESOMEPRAZOLE (NEXIUM) 40 MG CAPSULE    TAKE 1 CAPSULE (40 MG TOTAL) BY MOUTH DAILY.   OSELTAMIVIR (TAMIFLU) 75 MG CAPSULE    Take 1 capsule (75 mg total) by mouth 2 (two) times daily.

## 2016-12-20 ENCOUNTER — Other Ambulatory Visit: Payer: Self-pay | Admitting: Pulmonary Disease

## 2016-12-30 ENCOUNTER — Telehealth: Payer: Self-pay | Admitting: Internal Medicine

## 2016-12-30 NOTE — Telephone Encounter (Signed)
New message  Pt call to make f/u appt. Pt states he blackout a couple of weeks ago. Pt stated he broke some ribs from the fall. Pt also states hes has been experiencing some weight loss and dizziness. Pt does not think he cal wait until December to see the doctor. Please call back to discuss

## 2016-12-30 NOTE — Telephone Encounter (Signed)
Left message to call back on home and mobile numbers. 

## 2016-12-31 NOTE — Telephone Encounter (Signed)
Call placed to Pt.  Pt seen in [facility] ED for syncope.  Per Pt he got up at 0700 that morning to use the restroom and he began to feel "fuzzy" and then he blacked out for a few seconds.  Pt fell and broke some ribs.  Pt states the ER told him he was dehydrated.  Since this incident he had another incident of dizziness when he was playing golf.  He bent over to pick up a golf ball and had dizziness.   Per Pt he has had a 25 pound weight loss since he was started on diltiazem.  However Pt has also started working out 3x a week.  Pt states he has been feeling great since he has started working out.   Per Pt he checks his BP frequently, most readings are 110-120's over 65-70.  Pt thinks he has only had one episode this year.  Pt states he can tell immediately when he goes into afib.  Per Pt most episodes of afib happen in the middle of the night.  Pt states he takes his diltiazem in the am. Pt normally sees Dr. Lovena Le in [place].  Asked Pt if he would be willing to come to [facility] office if it meant he could see Dr. Lovena Le sooner.  Pt states that would be fine.  Notified Pt I would send his info to scheduler and see when we could get him in.  Pt agreed that visit not urgent but will try to get Pt in sooner than one year follow up.  Pt indicates understanding and appreciative of call. Will cont to monitor.

## 2017-01-10 ENCOUNTER — Ambulatory Visit (INDEPENDENT_AMBULATORY_CARE_PROVIDER_SITE_OTHER): Payer: Medicare Other | Admitting: Internal Medicine

## 2017-01-10 ENCOUNTER — Encounter: Payer: Self-pay | Admitting: Internal Medicine

## 2017-01-10 VITALS — BP 122/72 | HR 70 | Ht 72.0 in | Wt 213.0 lb

## 2017-01-10 DIAGNOSIS — I951 Orthostatic hypotension: Secondary | ICD-10-CM | POA: Diagnosis not present

## 2017-01-10 NOTE — Progress Notes (Signed)
HPI Mr. Dimichele returns today for follow-up and for evaluation of syncope. He is a pleasant 66 year old man with paroxysmal atrial fibrillation, hypertension, and relative intolerance to flecainide. The patient has been well-controlled with regard to his atrial fibrillation in the past. His episodes of atrial fibrillation have increased in the last several months although they still last only an hour to and occur less than once a month. When he goes into atrial fibrillation he feels palpitations and mild dyspnea. He will take a calcium channel blocker tablet and after a period of time his symptoms resolved. The patient has had episodes of lightheadedness and one episode where he actually passed out. The episodes of lightheadedness occur when he goes from sitting to standing and are fairly reproducible. He exercises vigorously and notes that his episodes occur after he is exercised. He notes that his weight will drop 4 or 5 pounds from one day to the next. He does not have palpitations associated with his dizzy spells. Allergies  Allergen Reactions  . Caffeine Other (See Comments)    Causes him to go into afib  . Flecainide Other (See Comments)    Caused heart to race too fast and had him in the ED  . Methotrexate Derivatives Nausea And Vomiting    Increased liver enzymes  . Pseudoephedrine-Ibuprofen Other (See Comments)    " afib"   . Penicillins Rash    .Marland KitchenHas patient had a PCN reaction causing immediate rash, facial/tongue/throat swelling, SOB or lightheadedness with hypotension: No, YES TO RASH  Has patient had a PCN reaction causing severe rash involving mucus membranes or skin necrosis: No Has patient had a PCN reaction that required hospitalization No Has patient had a PCN reaction occurring within the last 10 years: No If all of the above answers are "NO", then may proceed with Cephalosporin use.     Current Outpatient Prescriptions  Medication Sig Dispense Refill  . Ascorbic  Acid (VITAMIN C) 500 MG tablet Take 500 mg by mouth every morning. once daily with rose hips    . aspirin 81 MG tablet Take 81 mg by mouth once a week.     . Cholecalciferol (VITAMIN D3) 2000 UNITS CHEW Chew 1 tablet by mouth daily.    [name] diltiazem (CARDIZEM CD) 120 MG 24 hr capsule TAKE 1 CAPSULE (120 MG TOTAL) BY MOUTH DAILY. 90 capsule 3  . etanercept (ENBREL) 50 MG/ML injection Inject 50 mg into the skin once a week. On mondays    . ibuprofen (ADVIL,MOTRIN) 200 MG tablet Take 200 mg by mouth every 8 (eight) hours as needed.    [name] LORazepam (ATIVAN) 1 MG tablet TAKE 1/2 TO 1 TABLET BY MOUTH 3 TIMES A DAY AS NEEDED 90 tablet 5  . Magnesium 250 MG TABS Take 250 mg by mouth every other day.    . Multiple Vitamin (MULTIVITAMIN WITH MINERALS) TABS tablet Take 1 tablet by mouth daily.    . pantoprazole (PROTONIX) 40 MG tablet Take 40 mg by mouth daily.      No current facility-administered medications for this visit.      Past Medical History:  Diagnosis Date  . A-fib ([place])   . Acute arthritis   . Allergic rhinitis   . Atrial fibrillation ([place])    history of a fib  . Dermatitis   . Diverticulosis of colon   . Dysplastic nevus   . GERD (gastroesophageal reflux disease)   . GI bleeding 01/24/2015  . Hypercholesterolemia   .  Mononucleosis   . RA (rheumatoid arthritis) ([place])   . Tobacco use disorder     ROS:   All systems reviewed and negative except as noted in the HPI.   Past Surgical History:  Procedure Laterality Date  . CHOLECYSTECTOMY  2011   laparoscopic  . COLONOSCOPY  01/24/2015   x 3 in past  . dysplastic nevus  07/1991   excised from upper back  . ERCP N/A 02/18/2015   Procedure: ENDOSCOPIC RETROGRADE CHOLANGIOPANCREATOGRAPHY (ERCP);  Surgeon: Carol Ada, MD;  Location: [facility];  Service: Endoscopy;  Laterality: N/A;  . GIVENS CAPSULE STUDY  01/24/2015  . GIVENS CAPSULE STUDY N/A 01/24/2015   Procedure: GIVENS CAPSULE STUDY;  Surgeon: Juanita Craver, MD;  Location:  [facility];  Service: Endoscopy;  Laterality: N/A;  . L foot injury  age 48   w/ part amputation great toe  . TONSILLECTOMY AND ADENOIDECTOMY  as a child     Family History  Problem Relation Age of Onset  . Prostate cancer Father   . Other Father        pacemaker  . Heart disease Mother   . Other Mother        vascular disease     Social History   Social History  . Marital status: Married    Spouse name: Jeani Hawking x 38 yrs  . Number of children: 2  . Years of education: N/A   Occupational History  . [employer]    Social History Main Topics  . Smoking status: Former Smoker    Packs/day: 0.50    Years: 45.00    Types: Cigarettes    Quit date: 11/13/2009  . Smokeless tobacco: Never Used  . Alcohol use No  . Drug use: No  . Sexual activity: No   Other Topics Concern  . Not on file   Social History Narrative   One son with HBP   One son with IDDM      May return soon     BP 122/72   Pulse 70   Ht 6' (1.829 m)   Wt 213 lb (96.6 kg)   SpO2 97%   BMI 28.89 kg/m   Physical Exam:  Well appearing 66 year old man, NAD HEENT: Unremarkable Neck:  6 cm JVD, no thyromegally Lymphatics:  No adenopathy Back:  No CVA tenderness Lungs:  Clear with no wheezes, rales, or rhonchi. HEART:  Regular rate rhythm, no murmurs, no rubs, no clicks Abd:  soft, positive bowel sounds, no organomegally, no rebound, no guarding Ext:  2 plus pulses, no edema, no cyanosis, no clubbing Skin:  No rashes no nodules Neuro:  CN II through XII intact, motor grossly intact  EKG - none today   Assess/Plan: 1. Syncope - this is a new problem. His symptoms are most consistent with orthostasis. We discussed the importance of maintaining adequate sodium and fluid intake and avoiding dehydration. We also discussed the importance of standing for a brief period before walking to help avoid orthostasis. 2. Paroxysmal atrial fibrillation - his symptoms have increased slightly but still remain well  controlled. He will continue taking calcium channel blockers regularly and take an additional Cardizem as a pill in the pocket. I considered increasing his daily dose of Cardizem because of his propensity for orthostasis, we'll hold off on this for now. The patient is low risk for stroke with a chadsvasc score of 1.  Cristopher Peru, M.D.

## 2017-01-10 NOTE — Patient Instructions (Signed)
Medication Instructions:  Your physician recommends that you continue on your current medications as directed. Please refer to the Current Medication list given to you today.   Labwork: NONE   Testing/Procedures: NONE   Follow-Up: Your physician wants you to follow-up in: 1 Year with Dr. Lovena Le. You will receive a reminder letter in the mail two months in advance. If you don't receive a letter, please call our office to schedule the follow-up appointment.   Any Other Special Instructions Will Be Listed Below (If Applicable).  Eat more salt !    If you need a refill on your cardiac medications before your next appointment, please call your pharmacy.  Thank you for choosing [facility]!

## 2017-01-20 ENCOUNTER — Ambulatory Visit: Payer: BLUE CROSS/BLUE SHIELD | Admitting: Pulmonary Disease

## 2017-02-21 ENCOUNTER — Other Ambulatory Visit (INDEPENDENT_AMBULATORY_CARE_PROVIDER_SITE_OTHER): Payer: Medicare Other

## 2017-02-21 ENCOUNTER — Ambulatory Visit (INDEPENDENT_AMBULATORY_CARE_PROVIDER_SITE_OTHER): Payer: Medicare Other | Admitting: Pulmonary Disease

## 2017-02-21 ENCOUNTER — Ambulatory Visit (INDEPENDENT_AMBULATORY_CARE_PROVIDER_SITE_OTHER): Payer: Medicare Other

## 2017-02-21 ENCOUNTER — Ambulatory Visit (INDEPENDENT_AMBULATORY_CARE_PROVIDER_SITE_OTHER)
Admission: RE | Admit: 2017-02-21 | Discharge: 2017-02-21 | Disposition: A | Discharge status: Home / Self Care | Payer: Medicare Other | Source: Ambulatory Visit | Attending: Pulmonary Disease | Admitting: Pulmonary Disease

## 2017-02-21 VITALS — BP 120/76 | HR 60 | Temp 97.0°F | Ht 72.0 in | Wt 215.4 lb

## 2017-02-21 DIAGNOSIS — I480 Paroxysmal atrial fibrillation: Secondary | ICD-10-CM

## 2017-02-21 DIAGNOSIS — R911 Solitary pulmonary nodule: Secondary | ICD-10-CM

## 2017-02-21 DIAGNOSIS — N320 Bladder-neck obstruction: Secondary | ICD-10-CM | POA: Diagnosis not present

## 2017-02-21 DIAGNOSIS — E7800 Pure hypercholesterolemia, unspecified: Secondary | ICD-10-CM

## 2017-02-21 DIAGNOSIS — Z23 Encounter for immunization: Secondary | ICD-10-CM

## 2017-02-21 DIAGNOSIS — M069 Rheumatoid arthritis, unspecified: Secondary | ICD-10-CM

## 2017-02-21 DIAGNOSIS — I4891 Unspecified atrial fibrillation: Secondary | ICD-10-CM | POA: Diagnosis not present

## 2017-02-21 DIAGNOSIS — E559 Vitamin D deficiency, unspecified: Secondary | ICD-10-CM

## 2017-02-21 DIAGNOSIS — S2242XD Multiple fractures of ribs, left side, subsequent encounter for fracture with routine healing: Secondary | ICD-10-CM

## 2017-02-21 DIAGNOSIS — R0789 Other chest pain: Secondary | ICD-10-CM

## 2017-02-21 LAB — LIPID PANEL
CHOL/HDL RATIO: 3
CHOLESTEROL: 137 mg/dL (ref 0–200)
HDL: 45.1 mg/dL (ref >39.00)
LDL CALC: 74 mg/dL (ref 0–99)
NonHDL: 91.92
TRIGLYCERIDES: 90 mg/dL (ref 0.0–149.0)
VLDL: 18 mg/dL (ref 0.0–40.0)

## 2017-02-21 LAB — COMPREHENSIVE METABOLIC PANEL
ALBUMIN: 4.3 g/dL (ref 3.5–5.2)
ALT: 13 U/L (ref 0–53)
AST: 18 U/L (ref 0–37)
Alkaline Phosphatase: 50 U/L (ref 39–117)
BUN: 14 mg/dL (ref 6–23)
CALCIUM: 9.4 mg/dL (ref 8.4–10.5)
CHLORIDE: 104 meq/L (ref 96–112)
CO2: 28 meq/L (ref 19–32)
Creatinine, Ser: 1.15 mg/dL (ref 0.40–1.50)
GFR: 67.7 mL/min (ref >60.00)
Glucose, Bld: 96 mg/dL (ref 70–99)
POTASSIUM: 4.3 meq/L (ref 3.5–5.1)
Sodium: 141 mEq/L (ref 135–145)
Total Bilirubin: 0.9 mg/dL (ref 0.2–1.2)
Total Protein: 6.4 g/dL (ref 6.0–8.3)

## 2017-02-21 LAB — TSH: TSH: 2.63 u[IU]/mL (ref 0.35–4.50)

## 2017-02-21 LAB — CBC WITH DIFFERENTIAL/PLATELET
BASOS ABS: 0.1 10*3/uL (ref 0.0–0.1)
BASOS PCT: 0.8 % (ref 0.0–3.0)
Eosinophils Absolute: 0.1 10*3/uL (ref 0.0–0.7)
Eosinophils Relative: 1.7 % (ref 0.0–5.0)
HCT: 43.2 % (ref 39.0–52.0)
Hemoglobin: 14.8 g/dL (ref 13.0–17.0)
LYMPHS ABS: 1.5 10*3/uL (ref 0.7–4.0)
LYMPHS PCT: 24.9 % (ref 12.0–46.0)
MCHC: 34.3 g/dL (ref 30.0–36.0)
MCV: 87.2 fl (ref 78.0–100.0)
MONOS PCT: 9 % (ref 3.0–12.0)
Monocytes Absolute: 0.6 10*3/uL (ref 0.1–1.0)
NEUTROS ABS: 4 10*3/uL (ref 1.4–7.7)
NEUTROS PCT: 63.6 % (ref 43.0–77.0)
PLATELETS: 212 10*3/uL (ref 150.0–400.0)
RBC: 4.95 Mil/uL (ref 4.22–5.81)
RDW: 14.3 % (ref 11.5–15.5)
WBC: 6.2 10*3/uL (ref 4.0–10.5)

## 2017-02-21 LAB — PSA: PSA: 1.21 ng/mL (ref 0.10–4.00)

## 2017-02-21 LAB — VITAMIN D 25 HYDROXY (VIT D DEFICIENCY, FRACTURES): VITD: 37.74 ng/mL (ref 30.00–100.00)

## 2017-02-21 NOTE — Patient Instructions (Signed)
Today we updated your med list in our EPIC system...    Continue your current medications the same...  Today we checked your follow up FASTING blood work & CXR... We will arrange for an outpt CT Chest to compare to the July scan...    We will contact you w/ the results when available...   Keep up the good work w/ diet & exercise...  Call for any questions...  Let's plan a follow up visit in 58mo, sooner if needed for problems.Marland KitchenMarland Kitchen

## 2017-02-22 ENCOUNTER — Encounter: Payer: Self-pay | Admitting: Pulmonary Disease

## 2017-02-22 NOTE — Progress Notes (Addendum)
Subjective:    Patient ID: William West, male    DOB: Jun 28, 1950, 66 y.o.   MRN: [id number]  HPI 66 y/o WM, husb of Braelynn Lupton, here for a follow up visit & CPX... ~  SEE PREV EPIC NOTES FOR OLDER DATA >>    CXR 8/13 showed normal heart size, clear lungs, low lung vols & sl elev of right hemidiaph, s/p GB...  EKG 8/13 showed NSR, rate62, wnl, NAD...  LABS 8/13:  FLP- ok x LDL=120 on diet alone;  Chems- wnl;  CBC- wnl;  TSH=1.81;  PSA=0.62   LABS 8/14:  FLP- at goals on diet x LDL=109;  Chems- wnl;  CBC- wnl;  TSH=1.90;  PSA=0.67...  ~  January 14, 2014:  Yearly ROV & CPX> Keri reports a good yr overall; he continues to see [name] for RA on Enbrel & MTX every 3-40mofor labs and OVs (we do not have recent notes from them);  He notes sl cough w/ greenish sput in the AMs but no f/c/s, chest discomfort, or SOB; we discussed ZPak Mucinex, Fluids for prn use;  He also had a bout of diarrhea 6/15=> went to ER w/ neg stoll studies, treated empirically w/ Cipro/Flagyl & symptoms resolved...     Ex-smoker> he quit in 2011 & has remained off cigs; breathing is good he says, no issues; exerc w/ yard work, splits wood, etc; CXR is clear...    PAF> on Diltiazem120 & Flecainide prn per [name] "pill in the pocket" regimen (see below); he has used this med on only one occas during the last yr he says...    Chol> on diet alone & wt=230# (unchanged); FLP is stable w/ TChol 161, TG 117, HDL 37, LDL 100; we reviewed diet & wt reduction strategies...    Dyspepsia> on Prilosec '20mg'$ /d & this helps to control his symptoms; he saw [name] [facility] at [facility] for RUQ [facility] after lifting- CT was neg & the pain was felt to be musculoskeletal pain; s/p lap chole 7/11 for stones...    [facility]; [facility] & f/u should be 7-142yr..    RA> followed by DrGlean Salenvery 3-67m44mo Enbrel & MTX (currently 4/wk)- this has really helped & doing very well on Rx; we do not have notes or labs  from Rheum...    Hx dysplastic nevus, fair skinned/ red hair> advised to get Derm eval at least yearly & he promises to do so... We reviewed prob list, meds, xrays and labs> see below for updates >>   CXR 8/15 showed norm heart size, clear lungs, NAD...  LABS 6/15 via ER>  Chems- wnl;  CBC- wnl   LABS 8/15:  FLP- ok on diet alone;  TSH= 1.86;  PSA= 0.87   ~  March 05, 2014:  6wk ROV & add-on appt requested for 5d hx pain, tenderness, discomfort in suprapubic area but denies dysuria, hematuria, foul smelling urine, etc;  He notes "pressure" but no burning, no flankl or back pain, etc and he notes urine stream appears ok w/ good force;  Initially he felt chilled, shakey, but didn't take temp- he notes that tylenol helped;  States he had similar episode 1-2 yrs ago & symptoms resolved w/ meds called in for him at that time...  Exam is essentially neg- except min tender over bladder, neg CVAT, prostate is 2+ and normal, stool heme neg... We decided to check UA (clear), and Labs (all wnl- chems, CBC, Sed); and  cover empirically w/ Fluids and cranberry juice, ok to continue Tylenol, we are holding antibiotics until C&S returns... We reviewed prob list, meds, xrays and labs> see below for updates >>   LABS 10/15:  Chems- wnl w/ Cr=1.1;  CBC- wnl w/ Hg=13.9 & WBC=7.7;  Sed=17;  UA- clear w/ C&S pending...   ~  July 02, 2014:  58moROV & add-on appt requested for Abd Pain> states this is going on for several months and seems progressive- now getting "attacks" 3 times per week; states the discomfort is similar to prev GB attacks (he had GB removed by [name] in 2011); c/o lots of indigestion- notes some nausea & vomits on occas then feels better; says appetite is fair, eats ok w/o swallowing issues, then 30 mon later "it sits on my stomach"; he has noted white stool yest & states his urine has been darker; he has been on OTC Omeprazole20 w/o help he says... NOTE> he had recheck by [name] for abd  pain 145yrfter GB surg- they did CT Abd and it was NEG (prev cholecystectomy, sm hepatic dome cyst, diverticulosis, NAD)...Marland KitchenMarland Kitchen  He has hx PAF & is followed by [name]> last seen 11/15 & said to be doing well on the "pill in the pocket"strategy for control; rare palpit, no CP/ SOB; on ASA81, Diltiazem12058mam and Flecainide 100m15m needed for AFib (hasn't needed)...    He is followed by [name] Q3-19mo>619moRA on Enbrel & MTX => we do not have notes from him... We reviewed prob list, meds, xrays and labs> see below for updates >>   LABS 2/16:  Chems- wnl x GOT=120 GPT=248 (these were prev wnl);  AlkPhos=103, Amylase=50 (27-131) & Lipase=36 (11-59);  CBC- wnl;  Sed=5...   Abd Ultrasound 2/16 showed absent GB, ducts ok, poor vis of liver & pancreas, elarged spleen, kidneys ok, no other signif abn noted... PLAN>> Hx progressive abd discomfort w/ "attacks" of pain & some n/v; Labs w/ abn GOT/GPT but norm AlkPhos, Amylase/Lipase, and Sed=5; Sonar w/ splenomegaly, poor vis of liver & pancreas; we discussed Rx w/ stronger PPI for now- Nexium40 bid and refer to GI for further evaluation (?CT Abd and EGD?) and rx... ADDENDUM>> Pt called 07/22/14 w/ update- now on Enbrel & off MTX, LFTs are normal, appetite is good & RA under control, on Nexium40 before dinner & this is helping he says...  ~  January 16, 2015:  19mo R5mo Adonai Leamanre for his CPX> prev symptoms resolved and LFTs ret to normal off MTX;  Now feeling well, breathing is good w/o cough/ sput/ SOB etc... We reviewed the following medical problems during today's office visit >>     Ex-smoker> he quit in 2011 & has remained off cigs; breathing is good he says, no issues; exerc w/ yard work, splits wood, etc; CXR is clear...    PAF> on Diltiazem120 & off Flecainide (prn per [name] "pill in the pocket" regimen); he has used this med on only one occas during the last few yrs.    Chol> on diet alone & wt=226#; FLP is stable w/ TChol 152, TG 88,  HDL 41, LDL 93; we reviewed diet & wt reduction strategies...    Dyspepsia> on Nexium 40mg/d58mhis helps to control his symptoms; he saw [name] 11/12 at [facility] for RUQ pain after lifting- CT was neg & the pain was felt to be musculoskeletal pain; s/p lap chole 7/11 for stones; GI symptoms 2/16  resolved off MTX... ...    Divertics> [facility]; colon 2011 was neg & f/u should be 7-47yr...    RA> followed by DGlean Salenevery 3-647mon Enbrel & off MTX now- doing very well on monotherapy; we do not have notes or labs from Rheum...    Hx dysplastic nevus, fair skinned/ red hair> advised to get Derm eval at least yearly & he promises to do so... We reviewed prob list, meds, xrays and labs> see below for updates >> up to date on Immuniz...  CXR 8/16 showed norm heart size, clear lungs, NAD...   LABS 8/16:  FLP- at goals on diet alone;  Chems- wnl;  CBC- wnl;  TSH=1.63;  PSA=0.80;  Mg=2.1 (wnl)...   ~  December 24, 2015:  1y65yrV & add-on appt requested for URI symptoms> RanSimss a CPX sched for 49mo68mom now but called for this add-on appt due to a URI-- c/o sore throat, sinus drainage, cough, green sput, feeling bad, HA, ?temp 99/ +chills/ no sweats, grad onset over 1wk after exposure to grandkids... Meds include:  CardizemCD120, Nexium40, Ativan1mg 47m, and ENBREL50/wk per [name];  He is allergic to PCN... EXAM shows Afeb, VSS, O2sat=96% on RA;  HEENT- sl red, no exud, no adenopathy;  Chest- clear w/o w/r/r;  Heart- RR w/o m/r/g;  Abd- soft, nontender, neg;  Ext- neg w/o c/c/e;  Nuero- intact w/o focal abn...  CXR 12/24/15> norm heart size, calcif in Ao arch, clear lungs w/ sl prom interstitial markings in LLL, NAD...   LABS 12/24/15>  Chems- wnl, BS=105;  CBC- ok w/ Hg=13.1, WBC=11.9 w/ 73%segs;  TSH=1.93 IMP/PLAN>>  URI, acute bronchitis-- we discussed Rx w/ Depo120, Levaquin500 x7d, Mucinex600-2Bid + fluids, rec OTC Align/ Activia yogurt etc...   ~  January 21, 2016:  3wk ROV & pt is  here for his yearly ROV recheck>  Ronon's prev bronchitis has resolved, no GI issues, and back to baseline- doing satis w/o any new complaints or concerns; He is concerned because a friend was Dx w/ Lung CaWe reviewed the following medical problems during today's office visit >>     Ex-smoker> he quit in 2011 & has remained off cigs; breathing is good he says, no issues; exerc w/ yard work, splits wood, etc; acute bronchitis episode 12/2015- resolved w/ Levaquin; f/u CXR is clear...    PAF> on Diltiazem120 & off Flecainide (prn per [name]- he takes extra Diltiazem "prn"); he has used this extra med several times over the last yr; he says that [name] told him not to take ASA (?due to bleeding)...    Chol> on diet alone & wt=220#; FLP 8/17 is stable w/ TChol 141, TG 84, HDL 49, LDL 75; we reviewed diet & wt reduction strategies...    Dyspepsia> on Nexium 40mg/44mthis helps to control his symptoms; he saw [name] 11/12 at [facility] for RUQ pain after lifting- CT was neg & the pain was felt to be musculoskeletal pain; s/p lap chole 7/11 for stones; GI symptoms 2/16 resolved off MTX... ...    [facility] 2011 was neg & f/u should be 7-21yrs.71yr Urology> he saw DrMcKenzie 08/2015 for blood in semen- cleared spont, no recurrence, UA was clear; hematospermia felt to be benign & f/u prn...    RA> followed by DrBeekmGlean Salen3-70mo on 70moel & off MTX now- doing very well on monotherapy; last note 11/19/15 reviewed- RA of mult sites, on  Enbrel, pos Rheum factor, doing satis,     Hx dysplastic nevus, fair skinned/ red hair> advised to get Derm eval at least yearly & he promises to do so... EXAM shows Afeb, VSS, O2sat=100% on RA;  HEENT- sl red, no exud, no adenopathy;  Chest- clear w/o w/r/r;  Heart- RR w/o m/r/g;  Abd- soft, nontender, neg;  Ext- neg w/o c/c/e;  Nuero- intact w/o focal abn...  CXR 01/21/16>  Norm heart size, Ao atherosclerosis, clear lungs, NAD...  LABS 01/21/16>   FLP- wnl on diet alone;  PSA=2.15 => NOTE: this is up from 0.80 last yr & we will recheck in 40mo IMP/PLAN>>  RDabneyhas recovered from his bronchitis, other problems stable, note incr PSA velocity & we will recheck PSA in 650mo.  ~  April 19, 2016:  50m489moV & pulm recheck> RanJavohnw SG on 04/01/17 for an acute upper resp infection- sore throat, post naasl drip, cough w/ sm amt yellow sput, exposed to grandchild w/ URI, he takes Enbrel for RA; treated w/ ZPak & also given Neosporin ophthalmic drops for pink-eye;  Asked to f/u w/ me today- improved & URI symptoms resolved...    Today RanAleks c/o some dumping syndrome after eating, notes this is persistent after GB surg;  Also c/o right sided siatica and insomnia;  Notes considerable stress w/ leaves in his yard, prob w/ coyotes in his area (has a few farm animals/ cows; he needs to de-stress his life... Rec to use metamucil, offered Alpraz but he doesn't want new meds...    EXAM shows Afeb, VSS, O2sat=100% on RA;  HEENT- sl red, no exud, no adenopathy;  Chest- clear w/o w/r/r;  Heart- RR w/o m/r/g;  Abd- soft, nontender, neg;  Ext- neg w/o c/c/e;  Neuro- intact w/o focal abn... IMP/PLAN>>  Treyson's URI has resolved;  Other issues discussed- try Metamucil for dumping symptoms after eating, use Advil/ Tylenol/ heating pad for back discomfort w/ furthe eval id symptom persists; he has Melatonin and Ativan to help w/ rest/ stress... He'll be due for a follow up PSA in the spring...   ~  December 09, 2016:  89mo52mo & post- emergency room eval>  RandTillmont to the ER 12/02/16 after awakening ~7AM to go to the bathroom, felt lightheaded & before he could sit down he fell & struck his left chest on the commode w/ sharp left CP, felt weak w/ sweating, & it took him 5min40m get up; he did not hit his head or lose consciousness; no HA, neck pain, SOB, etc... ER eval revealed sl orthostatic, mild dehydration, left 10th/ 11th ant rib fxs; given IVF, Zofran, MS, and Tramadol &  asked to f/u here...  Since the Er visit he is feeling better, no longer postural/ weak/ dizzy/ etc;  We discussed his presentation & the findings (including incidental findings) & need for f/u as outlined below...     He last saw [name], CARDS-ep 04/29/16>  HBP, PAF, RA-- he uses Diltiazem120, CHADSVASC is zero, asked to f/u 68yr..17yr He continues to see [name] regularly for Rheum>  RA- seropos on ENBREL; last note scanned into epic 02/2016 & he is asked to request notes be sent to us to [place] into computer;   EXAM shows Afeb, VSS, O2sat=100% on RA;  HEENT- sl red, no exud, no adenopathy;  Chest- clear w/o w/r/r;  Heart- RR w/o m/r/g;  Abd- soft, nontender, neg;  Ext- neg w/o c/c/e;  Neuro- intact  w/o focal abn..  CXR 12/02/16 (independently reviewed by me in the PACS system) showed norm heart size, mild basilar atx, no pneumonia, 2 lower left ant rib fxs...   EKG 12/02/16 showed NSR, rate 80, wnl, sl late transition...  CT Chest 12/02/16>  Norm heart size, NEG for PE, coronary & Ao atherosclerosis, +prom bilat hilar & subcarinal LNs (68m), 979mlobulated pulm nodule in LLL, irreg cyst in Lingula ~1cm, nondisplaced lateral 10th & 11th rib fxs...  CT Abd & Pelvis 12/02/16> mild atherosclerosis of Ao, patent celiac/ SMA/ renals/ IMA, no focal liver abn, s/p GB w/ mild bile duct dil, fatty left inguinal hernia, mod sigmoid divertics...   LABS 12/02/16>  Chems- ok x BUN=21, Cr=1.32 (baseline=1.07);  Troponins= zero;  CBC- ok w Hg=15.0 (baseline =13.1), wbc=14.9 -- Note: they have copies of labs & wife concerned about WBC=14.9 & trending up + mult incidental findings on CT scans =. We discussed in detail & promised to f/u... IMP/PLAN>>  Colum's wife concerned he doesn't drink enough water/ gatorade & sweats a lot while workng outside etc; similarly she had mult questons about the incidental findings on his CT scans-- I reassured them as best I could & indicated that we would f/u CT Chest w/ contrast in  October to check the nodule & LNs... NOTE:  >50% of this 3058mrov was spent in counseling and coordination of care...   ~  February 22, 2017:  2-29mo2mo & recall that RandBraedenl in his bathroom 12/02/16 hitting his left chest on the commode; ER eval included a CT Angio Chest/Abd/Pelvis showing +prom bilat hilar & subcarinal LNs (16mm46mmm l32mlated peripheral nodule in LLL, irreg cyst in Lingula ~1cm, nondisplaced lateral 10th & 11th rib fxs;  Note that he also has RA & is followed by DrBeekGlean Salenbrel shots;  He is an ex-smoker w/ a 20-30 pack-yr smoking hx & quit in 2011;  He has no known prior history of lung disease... Alain Diandrels that his left chest pain has resolved "all healed" and [facility]; he reports that his breathing is good- denies SOB, no cough/ sput/ blood/ etc;  He continues to f/u w/ Rheum- [name], and Cards- [name] (see below)... He is due for 29mo CT80most recheck as rec by radiology...    He continues to f/u w/ [name], Rheum> last seen 10/2016 by his hx but we do not have recent notes from him; pt reports doing saitis on Enbrel every other week dosing now...    He saw [name], Cards 01/10/17>  Hx HBP, PAF- relative intolerance to flecainide, chadsvasc is only 1; he is on CardizemCD120 & notes occas AFib episodes for which he takes and extra Cardizem tablet; Cards felt that his syncope 7/12 was likely orthostatic & they rec that he avoid dehydration & liberalize his salt intake    EXAM shows Afeb, VSS, O2sat=100% on RA;  HEENT- sl red, no exud, no adenopathy;  Chest- clear w/o w/r/r;  Heart- RR w/o m/r/g;  Abd- soft, nontender, neg;  Ext- neg w/o c/c/e;  Neuro- intact w/o focal abn..  CXR 02/24/17 (independently reviewed by me in the PACS system) shows norm heart size, clear lungs, NAD...  LABS 02/24/17>  FLP- all parameters wnl on diet alone;  Chems- wnl;  CBC- wnl;  TSH- 2.63;  PSA- 1.21;  VitD- 38  CT Chest 02/25/17>  Norm heart size,  scattered atherosclerotic calcif, no adenopathy- small scattered LNs are stable, 9mm64m  LLL periph nodule is unchanged, 48m cavitary lesion in LUL is "relatively" stable (the cavity itself may be sl larger), no new lesions- radiology has rec proceeding w/ PET scan...  IMP/PLAN>>  RJohnneyis clinically stable on his current regimen; f/u CT Chest shows similar findings in the LNs, 920mperiph nodule in LLL and the 1036mUL cystic lesion; we will proceed w/ PET as discussed...   ~  ADDENDUM:  PET-CT done 03/08/17>  No signif hypermet assoc w/ the 1.0cm mildly thick walled cystic lingular lesion or the solid 0.9cm super-segm LLL pulm nodule- suggestive of benign etiology;  Nonspecific hypermetobolic mildly enlarged right mesenteric LN (new since 12/02/16 CT) & a reactive etiology is favored;  Mild hpermet at sites of the incompletely healed left 12-30-08-11th rib fxs;  Chronic findings include atherosclerotic calcif in Ao & coronaries, and marked sigmoid diverticulosis -- f/u CT Chest/ Abd/Pelvis w/ IV & oral constrast is rec in 19mo65mo         Problem List:     Ex-CIGARETTE SMOKER (ICD-305.1) - started as teen, up to 1/2 ppd, quit for up to 75yrs73yrcently 1/2 ppd but he says he quit 6/11 & has remained quit!  No hx resp tract diseases...  ~  baseline CXRs showed clear, NAD... ~ Marland KitchenCXR 7/11 showed essent clear lungs, +gallstone seen in right abd... ~  CXR 8/13 showed normal heart size, clear lungs, low lung vols & sl elev of right hemidiaph, s/p GB... ~  CXR 8/15 showed norm heart size, clear lungs, NAD. ~  CXR 8/16 showed norm heart size, clear lungs, NAD  PAF >> managed by [name] for Cards- first diagnosed 11/13 when he presented to ER w/ palpit; placed on CCB and converted to NSR... ~  He remains stable on Diltiazem 120 Qam and Flecainide "pill in the pocket" strategy ('100mg'$  prn palpit)... ~  EKG 11/15 showed NSR, rate75, NAD...   HYPERCHOLESTEROLEMIA, BORDERLINE (ICD-272.4) - currently on diet Rx  alone. ~  FLP 1/09 showed TChol 198, TG 127, HDL 34, LDL 138 ~  FLP 6/10 showed TChol 170, TG 96, HDL 40, LDL 111 ~  FLP 6/11 showed TChol 184, TG 104, HDL 43, LDL 120... rec low chol/ low fat diet. ~  FLP 8/12 (wt=237#) showed TChol 183, TG 97, HDL 43, LDL 121... Not really on diet "I just eat" ~  FLP 8/13 (wt=235#) showed TChol 176, TG 78, HDL 41, LDL 120 ~  FLP 8/14 on diet alone (wt=234#) showed TChol 166, TG 78, HDL 41, LDL 109 ~  FLP 8/15 on diet alone (wt=230#) showed TChol 161, TG 117, HDL 37, LDL 100 ~  FLP 8/16 on diet alone (wt=226#) showed TChol 152, TG 88, HDL 41, LDL 93  DYSPEPSIA (ICD-536.8) - takes PRILOSEC OTC daily... no dysphagia, N/V, abd pain, etc... no prev endo etc... states he really needs it daily & has pain/ difficulty if he stops the Rx. ~  S/p lap chole 7/11 by [name] for cholecystitis & stones... ~  2012: he had recurrent abd pain & saw [name] who did a CT Abd&Pelvis- NEG, NAD (prev cholecystectomy, sm hepatic dome cyst, diverticulosis, NAD); pain felt to be musculoskeletal...  DIVERTICULOSIS OF COLON (ICD-562.10)  ~  He had colonoscopy 1/06 from DrMann= divertics, tiny polyp (path=norm mucosa), hems... ~  F/u colon 11/11 by DrNat-Mann w/ divertics, hems, sm rectal polyps= fragments of benign mucosa only...  ABDOMINAL PAIN & Hx ELEVATED HEPATOCELLULAR ENZYMES >> resolved off MTX ~  2/16:  he presented w/ several month hx abd discomfort and seems progressive- now getting "attacks" 3 times per week; states the discomfort is similar to prev GB attacks (he had GB removed by [name] in 2011); c/o lots of indigestion- notes some nausea & vomits on occas then feels better; says appetite is fair, eats ok w/o swallowing issues, then 30 mon later "it sits on my stomach"; he has noted white stool yest & states his urine has been darker; he has been on OTC Omeprazole20 w/o help he says... NOTE> he had recheck by [name] for abd pain 74yrafter GB surg- they did CT  Abd and it was NEG (prev cholecystectomy, sm hepatic dome cyst, diverticulosis, NAD)..Marland KitchenMarland KitchenWe placed him on Nexium40Bid, and referred him to GI for further eval & rx... ~  LABS 2/16 revealed> Chems- wnl x GOT=120 GPT=248 (these were prev wnl); AlkPhos=103, Amylase=50 (27-131) & Lipase=36 (11-59); CBC- wnl;  Sed=5...  ~  Abd Ultrasound 2/16 showed absent GB, ducts ok, poor vis of liver & pancreas, elarged spleen, kidneys ok, no other signif abn noted.. ~  All symptoms resolved off MTX & LFTs ret to normal...  Hx of MONONUCLEOSIS (ICD-075) - in high school... had mono hepatitis... no known sequellae... he also had RMSF at age 66 pretty sick, recovered w/o sequellae. ~  labs have consistently shown normal LFTs x during his cholecystitis presentation 7/11...  RHEUMATOID ARTHRITIS >> Diagnosed w/ sero-pos RA 10/11 & referred to Rheum, [name]- on MTX currently 6/wk & occas Pred taper... ~  8/13:  He tells me that DClear Lakestarted ENBREL about 6 weeks ago 7 he is already feeling better (we do not have notes from Rheum). ~  8/14:  followed by DGlean Salenon Enbrel & MTX (currently 4/wk)- this has really helped & doing very well on Rx... ~  He continues to f/u w/ Rheum every 3-464moor exam & labs; we do not have notes or labs [facility]... ~  Now on Enbrel alone, off MTX due to elev LFTs...  Hx of DYSPLASTIC NEVUS (ICD-216.9) - removed from base of neck/ upper back by DrHensel/ DrCrowe in 1993... ~  8/13:  He is reminded to check w/ Derm at least yearly due to his fair complexion, red hair, sun exposure, etc... ~  8/14:  He has not yet followed up w/ derm & promises to do so soon...  HEALTH MAINTENANCE:  on ASA '81mg'$  daily... ~  GI:  colonoscopy 1/06 & 11/11 by DrMann as above... ~  GU: neg DRE & PSA here remains wnl... ~  Immunizations:  age [age]ow> exsmoker- given PNEUMOVAX 6/10;  given TETANUS shot 6/10; discussed yearly FLU vaccines.   Past Surgical History:  Procedure Laterality Date   . CHOLECYSTECTOMY  2011   laparoscopic  . COLONOSCOPY  01/24/2015   x 3 in past  . dysplastic nevus  07/1991   excised from upper back  . ERCP N/A 02/18/2015   Procedure: ENDOSCOPIC RETROGRADE CHOLANGIOPANCREATOGRAPHY (ERCP);  Surgeon: PaCarol AdaMD;  [facility];  Service: Endoscopy;  Laterality: N/A;  . GIVENS CAPSULE STUDY  01/24/2015  . GIVENS CAPSULE STUDY N/A 01/24/2015   Procedure: GIVENS CAPSULE STUDY;  Surgeon: JyJuanita CraverMD;  [facility] Service: Endoscopy;  Laterality: N/A;  . L foot injury  age [id number] w/ part amputation great toe  . TONSILLECTOMY AND ADENOIDECTOMY  as a child    Outpatient Encounter Prescriptions as of 02/21/2017  Medication Sig  . Ascorbic  Acid (VITAMIN C) 500 MG tablet Take 500 mg by mouth every morning. once daily with rose hips  . aspirin 81 MG tablet Take 81 mg by mouth once a week.   . Cholecalciferol (VITAMIN D3) 2000 UNITS CHEW Chew 1 tablet by mouth daily.  [name] diltiazem (CARDIZEM CD) 120 MG 24 hr capsule TAKE 1 CAPSULE (120 MG TOTAL) BY MOUTH DAILY.  [name] etanercept (ENBREL) 50 MG/ML injection Inject 50 mg into the skin once a week. On mondays  . ibuprofen (ADVIL,MOTRIN) 200 MG tablet Take 200 mg by mouth every 8 (eight) hours as needed.  [name] LORazepam (ATIVAN) 1 MG tablet TAKE 1/2 TO 1 TABLET BY MOUTH 3 TIMES A DAY AS NEEDED  . Magnesium 250 MG TABS Take 250 mg by mouth every other day.  . pantoprazole (PROTONIX) 40 MG tablet Take 40 mg by mouth daily.   . [DISCONTINUED] Multiple Vitamin (MULTIVITAMIN WITH MINERALS) TABS tablet Take 1 tablet by mouth daily.   No facility-administered encounter medications on file as of 02/21/2017.     Allergies  Allergen Reactions  . Caffeine Other (See Comments)    Causes him to go into afib  . Flecainide Other (See Comments)    Caused heart to race too fast and had him in the ED  . Methotrexate Derivatives Nausea And Vomiting    Increased liver enzymes  . Pseudoephedrine-Ibuprofen Other  (See Comments)    " afib"   . Penicillins Rash    .Marland KitchenHas patient had a PCN reaction causing immediate rash, facial/tongue/throat swelling, SOB or lightheadedness with hypotension: No, YES TO RASH  Has patient had a PCN reaction causing severe rash involving mucus membranes or skin necrosis: No Has patient had a PCN reaction that required hospitalization No Has patient had a PCN reaction occurring within the last 10 years: No If all of the above answers are "NO", then may proceed with Cephalosporin use.    Immunization History  Administered Date(s) Administered  . Influenza Split 02/22/2011, 03/14/2012, 03/16/2013, 02/08/2014, 02/22/2015  . Influenza, High Dose Seasonal PF 02/21/2017  . Influenza,inj,Quad PF,6+ Mos 01/21/2016  . Pneumococcal Polysaccharide-23 10/24/2008  . Td 09/21/2001, 10/24/2008  . Tdap 03/07/2014    Current Medications, Allergies, Past Medical History, Past Surgical History, Family History, and Social History were reviewed in [employer] record.   Review of Systems        See HPI - all other systems neg except as noted... Presents 2/16 c/o abd discomfort, intermit n/v, white stoools and dark urine...       The patient denies anorexia, fever, weight loss, weight gain, vision loss, decreased hearing, hoarseness, chest pain, syncope, dyspnea on exertion, peripheral edema, prolonged cough, headaches, hemoptysis, melena, hematochezia, hematuria, incontinence, muscle weakness, suspicious skin lesions, transient blindness, difficulty walking, depression, unusual weight change, abnormal bleeding, enlarged lymph nodes, and angioedema.   Prev c/o omplains of joint pain, joint swelling, loss of strength, and stiffness (all improved on MTX); denies joint redness, low back pain, mid back pain, muscle aches, cramps, and thoracic pain.   Objective:   Physical Exam     WD, Overweight, 66 y/o WM in NAD... GENERAL:  Alert & oriented; pleasant &  cooperative... HEENT:  [place]/AT, EOM-wnl, PERRLA, Fundi-benign, EACs-clear, TMs-wnl, NOSE-clear, THROAT-clear & wnl. NECK:  Supple w/ full ROM; no JVD; normal carotid impulses w/o bruits; no thyromegaly or nodules palpated; no lymphadenopathy. CHEST:  Clear to P & A; without wheezes/ rales/ or rhonchi. HEART:  Regular Rhythm; without murmurs/  rubs/ or gallops. ABDOMEN:  Soft & non-tender; s/p lap chole, normal bowel sounds; no organomegaly or masses detected. RECTAL:  Neg - prostate 2+ & nontender w/o nodules; stool hematest neg EXT: deformity L great toe, sl swelling & tender MCPs, some IPs, & wrist; no varicose veins/ venous insuffic/ or edema. NEURO:  CN's intact; motor testing normal; sensory testing normal; gait normal & balance OK. DERM:  scar in upper back at base of neck toward R side, no new lesions noted...  RADIOLOGY DATA:  Reviewed in the EPIC EMR & discussed w/ the patient...  LABORATORY DATA:  Reviewed in the EPIC EMR & discussed w/ the patient...   Assessment & Plan:    Episode of near-syncope w/ fall & 2 left ant rib fxs 11/2016, assoc w/ orthostasis and dehydration=> resolved w/ conservative management...  12/24/15> URI/ Bronchitis-- Rx w/ Depo, Levaquin, Mucinex, align... 03/2016> similar symptoms resolved w/ ZPak... 11/2016>  LLL pulm nodule and mild hilar/subcarinal adenopathy on CT Chest done for fall w/ ant left CWP => f/u scan planned for Oct2018... 02/24/17>   Zadiel is clinically stable on his current regimen; f/u CT Chest shows similar findings in the LNs, 65m periph nodule in LLL and the 112mLUL cystic lesion; we will proceed w/ PET as discussed.   PAF>  New prob 11/13- followed by [name] on Cardizem & Flecainide prn...  CHOL>  As noted w/ LDL down to 93 - needs better diet, wt reduction or consider low dose statin rx, he will decide...  Dyspepsia>  On Nexium daily...  Divertics/ sm polyps>  follwed by DrNat-Mann & up to date on colon screening etc...  Hx of  abd discomfort assoc w/ "attacks" of pain, indigestion, & intermit n/v; Labs w/ elev GOT/GPT; Sonar w/ splenomegaly but poor vis of liver/pancreas; placed on [id number] & referred to GI for further eval &rx => all symptoms resolved off MTX and LFTs ret ro normal...  Urinary symptoms 10/15>> hx is somewhat vague & seems minor; min tender over bladder otherw norm exam; Labs & UA are clear/wnl; Rec Fluids, Cranberry juice, tylenol prn, & observe; symptoms resolved...  Sero-positive RA>  Followed by DrGlean Salenn ENBREL & now off MTX...  Other medical problems as noted...  CPX>  He quit smoking 6/11;  Baseline EKG w/ NSR, NSSTTWA, NAD;  Baseline CXR = NAD; Labs look good as well...    Patient's Medications  New Prescriptions   No medications on file  Previous Medications   ASCORBIC ACID (VITAMIN C) 500 MG TABLET    Take 500 mg by mouth every morning. once daily with rose hips   ASPIRIN 81 MG TABLET    Take 81 mg by mouth once a week.    CHOLECALCIFEROL (VITAMIN D3) 2000 UNITS CHEW    Chew 1 tablet by mouth daily.   DILTIAZEM (CARDIZEM CD) 120 MG 24 HR CAPSULE    TAKE 1 CAPSULE (120 MG TOTAL) BY MOUTH DAILY.   ETANERCEPT (ENBREL) 50 MG/ML INJECTION    Inject 50 mg into the skin once a week. On mondays   IBUPROFEN (ADVIL,MOTRIN) 200 MG TABLET    Take 200 mg by mouth every 8 (eight) hours as needed.   LORAZEPAM (ATIVAN) 1 MG TABLET    TAKE 1/2 TO 1 TABLET BY MOUTH 3 TIMES A DAY AS NEEDED   MAGNESIUM 250 MG TABS    Take 250 mg by mouth every other day.   PANTOPRAZOLE (PROTONIX) 40 MG TABLET    Take 40  mg by mouth daily.   Modified Medications   No medications on file  Discontinued Medications   MULTIPLE VITAMIN (MULTIVITAMIN WITH MINERALS) TABS TABLET    Take 1 tablet by mouth daily.

## 2017-02-24 ENCOUNTER — Ambulatory Visit (INDEPENDENT_AMBULATORY_CARE_PROVIDER_SITE_OTHER)
Admission: RE | Admit: 2017-02-24 | Discharge: 2017-02-24 | Disposition: A | Discharge status: Home / Self Care | Payer: Medicare Other | Source: Ambulatory Visit | Attending: Pulmonary Disease | Admitting: Pulmonary Disease

## 2017-02-24 DIAGNOSIS — R140 Abdominal distension (gaseous): Secondary | ICD-10-CM | POA: Diagnosis not present

## 2017-02-24 DIAGNOSIS — R911 Solitary pulmonary nodule: Secondary | ICD-10-CM

## 2017-02-24 DIAGNOSIS — Z86010 Personal history of colonic polyps: Secondary | ICD-10-CM | POA: Diagnosis not present

## 2017-02-24 DIAGNOSIS — K219 Gastro-esophageal reflux disease without esophagitis: Secondary | ICD-10-CM | POA: Diagnosis not present

## 2017-02-24 DIAGNOSIS — K5730 Diverticulosis of large intestine without perforation or abscess without bleeding: Secondary | ICD-10-CM | POA: Diagnosis not present

## 2017-02-25 ENCOUNTER — Other Ambulatory Visit: Payer: Self-pay | Admitting: Pulmonary Disease

## 2017-02-25 DIAGNOSIS — R911 Solitary pulmonary nodule: Secondary | ICD-10-CM

## 2017-03-02 DIAGNOSIS — D225 Melanocytic nevi of trunk: Secondary | ICD-10-CM | POA: Diagnosis not present

## 2017-03-02 DIAGNOSIS — D1801 Hemangioma of skin and subcutaneous tissue: Secondary | ICD-10-CM | POA: Diagnosis not present

## 2017-03-02 DIAGNOSIS — L814 Other melanin hyperpigmentation: Secondary | ICD-10-CM | POA: Diagnosis not present

## 2017-03-02 DIAGNOSIS — D2261 Melanocytic nevi of right upper limb, including shoulder: Secondary | ICD-10-CM | POA: Diagnosis not present

## 2017-03-02 DIAGNOSIS — L570 Actinic keratosis: Secondary | ICD-10-CM | POA: Diagnosis not present

## 2017-03-08 ENCOUNTER — Ambulatory Visit (HOSPITAL_COMMUNITY)
Admission: RE | Admit: 2017-03-08 | Discharge: 2017-03-08 | Disposition: A | Discharge status: Home / Self Care | Payer: Medicare Other | Source: Ambulatory Visit | Attending: Pulmonary Disease | Admitting: Pulmonary Disease

## 2017-03-08 DIAGNOSIS — I700 Atherosclerosis of aorta: Secondary | ICD-10-CM | POA: Diagnosis not present

## 2017-03-08 DIAGNOSIS — R590 Localized enlarged lymph nodes: Secondary | ICD-10-CM | POA: Diagnosis not present

## 2017-03-08 DIAGNOSIS — R911 Solitary pulmonary nodule: Secondary | ICD-10-CM | POA: Insufficient documentation

## 2017-03-08 DIAGNOSIS — S2242XA Multiple fractures of ribs, left side, initial encounter for closed fracture: Secondary | ICD-10-CM | POA: Diagnosis not present

## 2017-03-08 DIAGNOSIS — X58XXXA Exposure to other specified factors, initial encounter: Secondary | ICD-10-CM | POA: Insufficient documentation

## 2017-03-08 DIAGNOSIS — R918 Other nonspecific abnormal finding of lung field: Secondary | ICD-10-CM | POA: Diagnosis not present

## 2017-03-08 DIAGNOSIS — I2510 Atherosclerotic heart disease of native coronary artery without angina pectoris: Secondary | ICD-10-CM | POA: Diagnosis not present

## 2017-03-08 DIAGNOSIS — K5730 Diverticulosis of large intestine without perforation or abscess without bleeding: Secondary | ICD-10-CM | POA: Diagnosis not present

## 2017-03-08 LAB — GLUCOSE, CAPILLARY: GLUCOSE-CAPILLARY: 100 mg/dL — AB (ref 65–99)

## 2017-03-08 MED ORDER — FLUDEOXYGLUCOSE F - 18 (FDG) INJECTION
10.7600 | Freq: Once | INTRAVENOUS | Status: AC | PRN
  Administered 2017-03-08: 10.76 via INTRAVENOUS

## 2017-03-09 ENCOUNTER — Telehealth: Payer: Self-pay | Admitting: Pulmonary Disease

## 2017-03-09 NOTE — Telephone Encounter (Signed)
Called and spoke with pts wife and she is concerned about the results of the PET scan.  She is aware that we will call them back once the results have been reviewed by SN.

## 2017-03-10 NOTE — Telephone Encounter (Signed)
Called and spoke with pt and he is aware of results that have been reviewed by CY.  He is aware that we will call him next week to make him aware of any changes if needed.

## 2017-03-10 NOTE — Telephone Encounter (Signed)
PET scan result- Two nodular areas in the lungs were being watched. Both are "cold" on PET, suggesting they are probably benign. The Radiologist recommends follow-up CT scan of the chest in 6-12 months.  A lymph node in the abdomen is enlarged and does show activity on the PET scan. The Radiologist favors this being benign, but recommends follow- up abdominal CT in 3-6 months.  Healing rib fractures are noted.  Dr Lenna Gilford can discuss further plans when he returns next week.

## 2017-03-10 NOTE — Telephone Encounter (Signed)
CY would you be willing to look at the PET scan and do a preliminary report until SN returns.  The pt and his wife are concerned about these results.  Thanks

## 2017-03-23 DIAGNOSIS — E663 Overweight: Secondary | ICD-10-CM | POA: Diagnosis not present

## 2017-03-23 DIAGNOSIS — Z79899 Other long term (current) drug therapy: Secondary | ICD-10-CM | POA: Diagnosis not present

## 2017-03-23 DIAGNOSIS — M25511 Pain in right shoulder: Secondary | ICD-10-CM | POA: Diagnosis not present

## 2017-03-23 DIAGNOSIS — Z6829 Body mass index (BMI) 29.0-29.9, adult: Secondary | ICD-10-CM | POA: Diagnosis not present

## 2017-03-23 DIAGNOSIS — M0579 Rheumatoid arthritis with rheumatoid factor of multiple sites without organ or systems involvement: Secondary | ICD-10-CM | POA: Diagnosis not present

## 2017-05-19 DIAGNOSIS — K5730 Diverticulosis of large intestine without perforation or abscess without bleeding: Secondary | ICD-10-CM | POA: Diagnosis not present

## 2017-05-19 DIAGNOSIS — R933 Abnormal findings on diagnostic imaging of other parts of digestive tract: Secondary | ICD-10-CM | POA: Diagnosis not present

## 2017-05-19 DIAGNOSIS — K219 Gastro-esophageal reflux disease without esophagitis: Secondary | ICD-10-CM | POA: Diagnosis not present

## 2017-05-19 DIAGNOSIS — R195 Other fecal abnormalities: Secondary | ICD-10-CM | POA: Diagnosis not present

## 2017-06-28 ENCOUNTER — Telehealth: Payer: Self-pay | Admitting: Pulmonary Disease

## 2017-06-28 MED ORDER — OSELTAMIVIR PHOSPHATE 75 MG PO CAPS: 75.0000 mg | ORAL_CAPSULE | Freq: Two times a day (BID) | ORAL | 0 refills | Status: DC

## 2017-06-28 NOTE — Telephone Encounter (Signed)
Pt was exposed to type B flu by his son.  Pt is currently asymptomatic but would like a rx of tamiflu as a precaution.  Pt notes he is on Embrel, had last injection yesterday.   Pt uses [facility] in [place]   SN please advise.  Thanks!

## 2017-06-28 NOTE — Telephone Encounter (Signed)
Per SN: Ok to send in Tamiflu 75mg  #10, 1 po BID.  Pt aware of recs.  rx sent to preferred pharmacy.  Nothing further needed.

## 2017-07-21 DIAGNOSIS — M0579 Rheumatoid arthritis with rheumatoid factor of multiple sites without organ or systems involvement: Secondary | ICD-10-CM | POA: Diagnosis not present

## 2017-07-21 DIAGNOSIS — M25511 Pain in right shoulder: Secondary | ICD-10-CM | POA: Diagnosis not present

## 2017-07-21 DIAGNOSIS — E669 Obesity, unspecified: Secondary | ICD-10-CM | POA: Diagnosis not present

## 2017-07-21 DIAGNOSIS — Z6830 Body mass index (BMI) 30.0-30.9, adult: Secondary | ICD-10-CM | POA: Diagnosis not present

## 2017-07-21 DIAGNOSIS — Z79899 Other long term (current) drug therapy: Secondary | ICD-10-CM | POA: Diagnosis not present

## 2017-08-12 ENCOUNTER — Other Ambulatory Visit: Payer: Self-pay | Admitting: Internal Medicine

## 2017-08-16 ENCOUNTER — Other Ambulatory Visit: Payer: Self-pay | Admitting: Pulmonary Disease

## 2017-08-22 ENCOUNTER — Encounter: Payer: Self-pay | Admitting: Pulmonary Disease

## 2017-08-22 ENCOUNTER — Ambulatory Visit: Payer: Medicare Other

## 2017-08-22 ENCOUNTER — Other Ambulatory Visit (INDEPENDENT_AMBULATORY_CARE_PROVIDER_SITE_OTHER): Payer: Medicare Other

## 2017-08-22 ENCOUNTER — Ambulatory Visit (INDEPENDENT_AMBULATORY_CARE_PROVIDER_SITE_OTHER): Payer: Medicare Other | Admitting: Pulmonary Disease

## 2017-08-22 VITALS — BP 118/82 | HR 78 | Temp 97.7°F | Ht 72.0 in | Wt 215.2 lb

## 2017-08-22 DIAGNOSIS — R599 Enlarged lymph nodes, unspecified: Secondary | ICD-10-CM | POA: Diagnosis not present

## 2017-08-22 DIAGNOSIS — Z87891 Personal history of nicotine dependence: Secondary | ICD-10-CM

## 2017-08-22 DIAGNOSIS — E7800 Pure hypercholesterolemia, unspecified: Secondary | ICD-10-CM

## 2017-08-22 DIAGNOSIS — R935 Abnormal findings on diagnostic imaging of other abdominal regions, including retroperitoneum: Secondary | ICD-10-CM

## 2017-08-22 DIAGNOSIS — M069 Rheumatoid arthritis, unspecified: Secondary | ICD-10-CM | POA: Diagnosis not present

## 2017-08-22 DIAGNOSIS — I480 Paroxysmal atrial fibrillation: Secondary | ICD-10-CM

## 2017-08-22 DIAGNOSIS — R911 Solitary pulmonary nodule: Secondary | ICD-10-CM

## 2017-08-22 DIAGNOSIS — R9389 Abnormal findings on diagnostic imaging of other specified body structures: Secondary | ICD-10-CM | POA: Diagnosis not present

## 2017-08-22 DIAGNOSIS — Z23 Encounter for immunization: Secondary | ICD-10-CM

## 2017-08-22 LAB — BASIC METABOLIC PANEL
BUN: 12 mg/dL (ref 6–23)
CO2: 29 mEq/L (ref 19–32)
Calcium: 9.2 mg/dL (ref 8.4–10.5)
Chloride: 104 mEq/L (ref 96–112)
Creatinine, Ser: 1.14 mg/dL (ref 0.40–1.50)
GFR: 68.28 mL/min (ref >60.00)
GLUCOSE: 107 mg/dL — AB (ref 70–99)
POTASSIUM: 4.3 meq/L (ref 3.5–5.1)
Sodium: 140 mEq/L (ref 135–145)

## 2017-08-22 NOTE — Progress Notes (Signed)
Subjective:    Patient ID: William West, male    DOB: 1950-09-28, 67 y.o.   MRN: [id number]  HPI 67 y/o WM, husb of [name], here for a follow up visit & CPX... ~  SEE PREV EPIC NOTES FOR OLDER DATA >>    CXR 8/13 showed normal heart size, clear lungs, low lung vols & sl elev of right hemidiaph, s/p GB...  EKG 8/13 showed NSR, rate62, wnl, NAD...  LABS 8/13:  FLP- ok x LDL=120 on diet alone;  Chems- wnl;  CBC- wnl;  TSH=1.81;  PSA=0.62   LABS 8/14:  FLP- at goals on diet x LDL=109;  Chems- wnl;  CBC- wnl;  TSH=1.90;  PSA=0.67...  ~  January 14, 2014:  Yearly ROV & CPX> [name] reports a good yr overall; he continues to see [name] for RA on Enbrel & MTX every 3-2mofor labs and OVs (we do not have recent notes from them);  He notes sl cough w/ greenish sput in the AMs but no f/c/s, chest discomfort, or SOB; we discussed ZPak Mucinex, Fluids for prn use;  He also had a bout of diarrhea 6/15=> went to ER w/ neg stoll studies, treated empirically w/ Cipro/Flagyl & symptoms resolved...     Ex-smoker> he quit in 2011 & has remained off cigs; breathing is good he says, no issues; exerc w/ yard work, splits wood, etc; CXR is clear...    PAF> on Diltiazem120 & Flecainide prn per [name] "pill in the pocket" regimen (see below); he has used this med on only one occas during the last yr he says...    Chol> on diet alone & wt=230# (unchanged); FLP is stable w/ TChol 161, TG 117, HDL 37, LDL 100; we reviewed diet & wt reduction strategies...    Dyspepsia> on Prilosec 254md & this helps to control his symptoms; he saw [name] 11/12 at [facility] for RUQ pain after lifting- CT was neg & the pain was felt to be musculoskeletal pain; s/p lap chole 7/11 for stones...    [facility] colon 2011 was neg & f/u should be 7-1058yr.    RA> followed by DrBGlean Salenery 3-22mo44moEnbrel & MTX (currently 4/wk)- this has really helped & doing very well on Rx; we do not have notes or labs  from Rheum...    Hx dysplastic nevus, fair skinned/ red hair> advised to get Derm eval at least yearly & he promises to do so... We reviewed prob list, meds, xrays and labs> see below for updates >>   CXR 8/15 showed norm heart size, clear lungs, NAD...  LABS 6/15 via ER>  Chems- wnl;  CBC- wnl   LABS 8/15:  FLP- ok on diet alone;  TSH= 1.86;  PSA= 0.87   ~  March 05, 2014:  6wk ROV & add-on appt requested for 5d hx pain, tenderness, discomfort in suprapubic area but denies dysuria, hematuria, foul smelling urine, etc;  He notes "pressure" but no burning, no flankl or back pain, etc and he notes urine stream appears ok w/ good force;  Initially he felt chilled, shakey, but didn't take temp- he notes that tylenol helped;  States he had similar episode 1-2 yrs ago & symptoms resolved w/ meds called in for him at that time...  Exam is essentially neg- except min tender over bladder, neg CVAT, prostate is 2+ and normal, stool heme neg... We decided to check UA (clear), and Labs (all wnl- chems, CBC, Sed); and  cover empirically w/ Fluids and cranberry juice, ok to continue Tylenol, we are holding antibiotics until C&S returns... We reviewed prob list, meds, xrays and labs> see below for updates >>   LABS 10/15:  Chems- wnl w/ Cr=1.1;  CBC- wnl w/ Hg=13.9 & WBC=7.7;  Sed=17;  UA- clear w/ C&S pending...   ~  July 02, 2014:  58moROV & add-on appt requested for Abd Pain> states this is going on for several months and seems progressive- now getting "attacks" 3 times per week; states the discomfort is similar to prev GB attacks (he had GB removed by [name] in 2011); c/o lots of indigestion- notes some nausea & vomits on occas then feels better; says appetite is fair, eats ok w/o swallowing issues, then 30 mon later "it sits on my stomach"; he has noted white stool yest & states his urine has been darker; he has been on OTC Omeprazole20 w/o help he says... NOTE> he had recheck by [name] for abd  pain 145yrfter GB surg- they did CT Abd and it was NEG (prev cholecystectomy, sm hepatic dome cyst, diverticulosis, NAD)...Marland KitchenMarland Kitchen  He has hx PAF & is followed by [name]> last seen 11/15 & said to be doing well on the "pill in the pocket"strategy for control; rare palpit, no CP/ SOB; on ASA81, Diltiazem12058mam and Flecainide 100m15m needed for AFib (hasn't needed)...    He is followed by [name] Q3-19mo>619moRA on Enbrel & MTX => we do not have notes from him... We reviewed prob list, meds, xrays and labs> see below for updates >>   LABS 2/16:  Chems- wnl x GOT=120 GPT=248 (these were prev wnl);  AlkPhos=103, Amylase=50 (27-131) & Lipase=36 (11-59);  CBC- wnl;  Sed=5...   Abd Ultrasound 2/16 showed absent GB, ducts ok, poor vis of liver & pancreas, elarged spleen, kidneys ok, no other signif abn noted... PLAN>> Hx progressive abd discomfort w/ "attacks" of pain & some n/v; Labs w/ abn GOT/GPT but norm AlkPhos, Amylase/Lipase, and Sed=5; Sonar w/ splenomegaly, poor vis of liver & pancreas; we discussed Rx w/ stronger PPI for now- Nexium40 bid and refer to GI for further evaluation (?CT Abd and EGD?) and rx... ADDENDUM>> Pt called 07/22/14 w/ update- now on Enbrel & off MTX, LFTs are normal, appetite is good & RA under control, on Nexium40 before dinner & this is helping he says...  ~  January 16, 2015:  19mo R5mo Adonai Leamanre for his CPX> prev symptoms resolved and LFTs ret to normal off MTX;  Now feeling well, breathing is good w/o cough/ sput/ SOB etc... We reviewed the following medical problems during today's office visit >>     Ex-smoker> he quit in 2011 & has remained off cigs; breathing is good he says, no issues; exerc w/ yard work, splits wood, etc; CXR is clear...    PAF> on Diltiazem120 & off Flecainide (prn per [name] "pill in the pocket" regimen); he has used this med on only one occas during the last few yrs.    Chol> on diet alone & wt=226#; FLP is stable w/ TChol 152, TG 88,  HDL 41, LDL 93; we reviewed diet & wt reduction strategies...    Dyspepsia> on Nexium 40mg/d58mhis helps to control his symptoms; he saw [name] 11/12 at [facility] for RUQ pain after lifting- CT was neg & the pain was felt to be musculoskeletal pain; s/p lap chole 7/11 for stones; GI symptoms 2/16  resolved off MTX... ...    Divertics> [facility]; colon 2011 was neg & f/u should be 7-47yr...    RA> followed by DGlean Salenevery 3-647mon Enbrel & off MTX now- doing very well on monotherapy; we do not have notes or labs from Rheum...    Hx dysplastic nevus, fair skinned/ red hair> advised to get Derm eval at least yearly & he promises to do so... We reviewed prob list, meds, xrays and labs> see below for updates >> up to date on Immuniz...  CXR 8/16 showed norm heart size, clear lungs, NAD...   LABS 8/16:  FLP- at goals on diet alone;  Chems- wnl;  CBC- wnl;  TSH=1.63;  PSA=0.80;  Mg=2.1 (wnl)...   ~  December 24, 2015:  1y65yrV & add-on appt requested for URI symptoms> RanSimss a CPX sched for 49mo68mom now but called for this add-on appt due to a URI-- c/o sore throat, sinus drainage, cough, green sput, feeling bad, HA, ?temp 99/ +chills/ no sweats, grad onset over 1wk after exposure to grandkids... Meds include:  CardizemCD120, Nexium40, Ativan1mg 47m, and ENBREL50/wk per [name];  He is allergic to PCN... EXAM shows Afeb, VSS, O2sat=96% on RA;  HEENT- sl red, no exud, no adenopathy;  Chest- clear w/o w/r/r;  Heart- RR w/o m/r/g;  Abd- soft, nontender, neg;  Ext- neg w/o c/c/e;  Nuero- intact w/o focal abn...  CXR 12/24/15> norm heart size, calcif in Ao arch, clear lungs w/ sl prom interstitial markings in LLL, NAD...   LABS 12/24/15>  Chems- wnl, BS=105;  CBC- ok w/ Hg=13.1, WBC=11.9 w/ 73%segs;  TSH=1.93 IMP/PLAN>>  URI, acute bronchitis-- we discussed Rx w/ Depo120, Levaquin500 x7d, Mucinex600-2Bid + fluids, rec OTC Align/ Activia yogurt etc...   ~  January 21, 2016:  3wk ROV & pt is  here for his yearly ROV recheck>  Ronon's prev bronchitis has resolved, no GI issues, and back to baseline- doing satis w/o any new complaints or concerns; He is concerned because a friend was Dx w/ Lung CaWe reviewed the following medical problems during today's office visit >>     Ex-smoker> he quit in 2011 & has remained off cigs; breathing is good he says, no issues; exerc w/ yard work, splits wood, etc; acute bronchitis episode 12/2015- resolved w/ Levaquin; f/u CXR is clear...    PAF> on Diltiazem120 & off Flecainide (prn per [name]- he takes extra Diltiazem "prn"); he has used this extra med several times over the last yr; he says that [name] told him not to take ASA (?due to bleeding)...    Chol> on diet alone & wt=220#; FLP 8/17 is stable w/ TChol 141, TG 84, HDL 49, LDL 75; we reviewed diet & wt reduction strategies...    Dyspepsia> on Nexium 40mg/44mthis helps to control his symptoms; he saw [name] 11/12 at [facility] for RUQ pain after lifting- CT was neg & the pain was felt to be musculoskeletal pain; s/p lap chole 7/11 for stones; GI symptoms 2/16 resolved off MTX... ...    [facility] 2011 was neg & f/u should be 7-21yrs.71yr Urology> he saw DrMcKenzie 08/2015 for blood in semen- cleared spont, no recurrence, UA was clear; hematospermia felt to be benign & f/u prn...    RA> followed by DrBeekmGlean Salen3-70mo on 70moel & off MTX now- doing very well on monotherapy; last note 11/19/15 reviewed- RA of mult sites, on  Enbrel, pos Rheum factor, doing satis,     Hx dysplastic nevus, fair skinned/ red hair> advised to get Derm eval at least yearly & he promises to do so... EXAM shows Afeb, VSS, O2sat=100% on RA;  HEENT- sl red, no exud, no adenopathy;  Chest- clear w/o w/r/r;  Heart- RR w/o m/r/g;  Abd- soft, nontender, neg;  Ext- neg w/o c/c/e;  Nuero- intact w/o focal abn...  CXR 01/21/16>  Norm heart size, Ao atherosclerosis, clear lungs, NAD...  LABS 01/21/16>   FLP- wnl on diet alone;  PSA=2.15 => NOTE: this is up from 0.80 last yr & we will recheck in 40mo IMP/PLAN>>  RDabneyhas recovered from his bronchitis, other problems stable, note incr PSA velocity & we will recheck PSA in 650mo.  ~  April 19, 2016:  50m489moV & pulm recheck> RanJavohnw SG on 04/01/17 for an acute upper resp infection- sore throat, post naasl drip, cough w/ sm amt yellow sput, exposed to grandchild w/ URI, he takes Enbrel for RA; treated w/ ZPak & also given Neosporin ophthalmic drops for pink-eye;  Asked to f/u w/ me today- improved & URI symptoms resolved...    Today RanAleks c/o some dumping syndrome after eating, notes this is persistent after GB surg;  Also c/o right sided siatica and insomnia;  Notes considerable stress w/ leaves in his yard, prob w/ coyotes in his area (has a few farm animals/ cows; he needs to de-stress his life... Rec to use metamucil, offered Alpraz but he doesn't want new meds...    EXAM shows Afeb, VSS, O2sat=100% on RA;  HEENT- sl red, no exud, no adenopathy;  Chest- clear w/o w/r/r;  Heart- RR w/o m/r/g;  Abd- soft, nontender, neg;  Ext- neg w/o c/c/e;  Neuro- intact w/o focal abn... IMP/PLAN>>  Treyson's URI has resolved;  Other issues discussed- try Metamucil for dumping symptoms after eating, use Advil/ Tylenol/ heating pad for back discomfort w/ furthe eval id symptom persists; he has Melatonin and Ativan to help w/ rest/ stress... He'll be due for a follow up PSA in the spring...   ~  December 09, 2016:  89mo52mo & post- emergency room eval>  RandTillmont to the ER 12/02/16 after awakening ~7AM to go to the bathroom, felt lightheaded & before he could sit down he fell & struck his left chest on the commode w/ sharp left CP, felt weak w/ sweating, & it took him 5min40m get up; he did not hit his head or lose consciousness; no HA, neck pain, SOB, etc... ER eval revealed sl orthostatic, mild dehydration, left 10th/ 11th ant rib fxs; given IVF, Zofran, MS, and Tramadol &  asked to f/u here...  Since the Er visit he is feeling better, no longer postural/ weak/ dizzy/ etc;  We discussed his presentation & the findings (including incidental findings) & need for f/u as outlined below...     He last saw [name], CARDS-ep 04/29/16>  HBP, PAF, RA-- he uses Diltiazem120, CHADSVASC is zero, asked to f/u 68yr..17yr He continues to see [name] regularly for Rheum>  RA- seropos on ENBREL; last note scanned into epic 02/2016 & he is asked to request notes be sent to us to [place] into computer;   EXAM shows Afeb, VSS, O2sat=100% on RA;  HEENT- sl red, no exud, no adenopathy;  Chest- clear w/o w/r/r;  Heart- RR w/o m/r/g;  Abd- soft, nontender, neg;  Ext- neg w/o c/c/e;  Neuro- intact  w/o focal abn..  CXR 12/02/16 (independently reviewed by me in the PACS system) showed norm heart size, mild basilar atx, no pneumonia, 2 lower left ant rib fxs...   EKG 12/02/16 showed NSR, rate 80, wnl, sl late transition...  CT Chest 12/02/16>  Norm heart size, NEG for PE, coronary & Ao atherosclerosis, +prom bilat hilar & subcarinal LNs (17m), 968mlobulated pulm nodule in LLL, irreg cyst in Lingula ~1cm, nondisplaced lateral 10th & 11th rib fxs...  CT Abd & Pelvis 12/02/16> mild atherosclerosis of Ao, patent celiac/ SMA/ renals/ IMA, no focal liver abn, s/p GB w/ mild bile duct dil, fatty left inguinal hernia, mod sigmoid divertics...   LABS 12/02/16>  Chems- ok x BUN=21, Cr=1.32 (baseline=1.07);  Troponins= zero;  CBC- ok w Hg=15.0 (baseline =13.1), wbc=14.9 -- Note: they have copies of labs & wife concerned about WBC=14.9 & trending up + mult incidental findings on CT scans =. We discussed in detail & promised to f/u... IMP/PLAN>>  Ivery's wife concerned he doesn't drink enough water/ gatorade & sweats a lot while workng outside etc; similarly she had mult questons about the incidental findings on his CT scans-- I reassured them as best I could & indicated that we would f/u CT Chest w/ contrast in  October to check the nodule & LNs... NOTE:  >50% of this 3016mrov was spent in counseling and coordination of care...  ~  February 22, 2017:  2-20mo19mo & recall that RandGill in his bathroom 12/02/16 hitting his left chest on the commode; ER eval included a CT Angio Chest/Abd/Pelvis showing +prom bilat hilar & subcarinal LNs (16mm7mmm l33mlated peripheral nodule in LLL, irreg cyst in Lingula ~1cm, nondisplaced lateral 10th & 11th rib fxs;  Note that he also has RA & is followed by DrBeekGlean Salenbrel shots;  He is an ex-smoker w/ a 20-30 pack-yr smoking hx & quit in 2011;  He has no known prior history of lung disease... Bandy Demetricls that his left chest pain has resolved "all healed" and [facility] [place]; he reports that his breathing is good- denies SOB, no cough/ sput/ blood/ etc;  He continues to f/u w/ Rheum- [name], and Cards- [name] (see below)... He is due for 20mo CT60most recheck as rec by radiology...    He continues to f/u w/ [name], Rheum> last seen 10/2016 by his hx but we do not have recent notes from him; pt reports doing saitis on Enbrel every other week dosing now...    He saw [name], Cards 01/10/17>  Hx HBP, PAF- relative intolerance to flecainide, chadsvasc is only 1; he is on CardizemCD120 & notes occas AFib episodes for which he takes and extra Cardizem tablet; Cards felt that his syncope 7/12 was likely orthostatic & they rec that he avoid dehydration & liberalize his salt intake    EXAM shows Afeb, VSS, O2sat=100% on RA;  HEENT- sl red, no exud, no adenopathy;  Chest- clear w/o w/r/r;  Heart- RR w/o m/r/g;  Abd- soft, nontender, neg;  Ext- neg w/o c/c/e;  Neuro- intact w/o focal abn..  CXR 02/24/17 (independently reviewed by me in the PACS system) shows norm heart size, clear lungs, NAD...  LABS 02/24/17>  FLP- all parameters wnl on diet alone;  Chems- wnl;  CBC- wnl;  TSH- 2.63;  PSA- 1.21;  VitD- 38  CT Chest 02/25/17>  Norm heart size, scattered  atherosclerotic calcif, no adenopathy- small scattered LNs are stable, 9mm LLL21m  periph nodule is unchanged, 26m cavitary lesion in LUL is "relatively" stable (the cavity itself may be sl larger), no new lesions- radiology has rec proceeding w/ PET scan...  IMP/PLAN>>  RChristhoperis clinically stable on his current regimen; f/u CT Chest shows similar findings in the LNs, 976mperiph nodule in LLL and the 1077mUL cystic lesion; we will proceed w/ PET as discussed...   ~  ADDENDUM:  PET-CT done 03/08/17>  No signif hypermet assoc w/ the 1.0cm mildly thick walled cystic lingular lesion or the solid 0.9cm super-segm LLL pulm nodule- suggestive of benign etiology;  Nonspecific hypermetobolic mildly enlarged right mesenteric LN (new since 12/02/16 CT) & a reactive etiology is favored;  Mild hpermet at sites of the incompletely healed left 12-30-08-11th rib fxs;  Chronic findings include atherosclerotic calcif in Ao & coronaries, and marked sigmoid diverticulosis -- f/u CT Chest/ Abd/Pelvis w/ IV & oral constrast is rec in 77mo64mo  ~  August 22, 2017:  77mo 39mo- as above- RandyDequannrns for a 77mo R21mo is due for a f/u CT Chest/ Abd/ Pelvis; he reports doing satis- feels well, no new complaints or concerns;  He notes that he had a URI in [id number]/ ZPak & resolved;  He denies cough, sput/ hemoptysis, SOB, or CP;  He has some allergies & pollen is a little rough;  He denies CP, palpit/ edema;  GI & GU are ok;  He uses Ativan Qhs for sleep...  We reviewed the following medical problems during today's office visit>      Ex-smoker> he quit in 2011 & has remained off cigs; breathing is good he says, no issues; exerc w/ yard work, splits wood, etc; acute bronchitis episode 12/2015- resolved w/ Levaquin; f/u CXR is clear...    Abn CT CHEST>  Scan 11/2016 after fall w/ trauma showed Norm heart size, NEG for PE, coronary & Ao atherosclerosis, +prom bilat hilar & subcarinal LNs (16mm),73m lob24mted pulm nodule in LLL, irreg cyst in  Lingula ~1cm, nondisplaced lateral 10th & 11th rib fxs; f/u scan & PET 02/2017 showed no signif hypermet assoc w/ the 1.0cm mildly thick walled cystic lingular lesion or the solid 0.9cm super-segm LLL pulm nodule- suggestive of benign etiology;  Nonspecific hypermetobolic mildly enlarged right mesenteric LN (new since 12/02/16 CT) & a reactive etiology is favored;  Mild hpermet at sites of the incompletely healed left 12-30-08-11th rib fxs;  Chronic findings include atherosclerotic calcif in Ao & coronaries, and marked sigmoid diverticulosis; repeat CT Chest/ Abd/ Pelvis due now 08/2017=> see below...    PAF> on ASA81 & DiltiazemCD120 (off Flecainide) & followed by [name] w/ extra Diltiazem prn (he has used this extra med several times over the last yr); he says that [name] told him not to take ASA (?due to bleeding)...    Chol> on diet alone & wt=215#; FLP 10/18 is stable w/ TChol 137, TG 90, HDL 45, LDL 74; we reviewed diet & wt reduction strategies...    Dyspepsia> on Omep '40mg'$ /d & this helps to control his symptoms; he saw [name] 11/12 at [facility] for RUQ pain after lifting- CT was neg & the pain was felt to be musculoskeletal pain; s/p lap chole 7/11 for stones; GI symptoms 2/16 resolved off MTX... ...    Divertics> [facility] & f/u 01/2015 showed divertics, hems & otherw neg;; She saw Pt 04/2017 for GERD, divertics, and note reviewed...Marland KitchenMarland Kitchen  Urology> he saw DrMcKenzie 08/2015 for blood in semen- cleared spont, no recurrence, UA was clear; hematospermia felt to be benign & f/u prn...    RA> followed by Glean Salen every 3-26moon Enbrel & off MTX now- doing very well on monotherapy; last note 11/19/15 reviewed- RA of mult sites, on Enbrel, pos Rheum factor, doing satis,     Hx dysplastic nevus, fair skinned/ red hair> advised to get Derm eval at least yearly & he promises to do so... EXAM shows Afeb, VSS, O2sat=98% on RA;  HEENT- sl red, no exud, no adenopathy;  Chest- clear  w/o w/r/r;  Heart- RR w/o m/r/g;  Abd- soft, nontender, neg;  Ext- neg w/o c/c/e;  Neuro- intact w/o focal abn..[name] LABS 08/22/17>  Chems- ok w/ K=4.3, BS=107, Cr=1.14 IMP/PLAN>>  RJamorrisis clinically stable, doing well- he is due for his f/u CT scans- as below, nothing acute or progressive & we continue to follow;  Given Prevnar-13 today & rec to continue same meds w/ ROV 646mo.  ~  ADDENDUM:  CT Chest, Abd, Pelvis done 09/06/17>  Norm heart size, no adenopathy, unchanged 1054mavitary nodule in LUL, and 7mm35mdule in LLL, no new nodules;  In abd- small amt of pneumobilia in left hep lobe, s/p GB & presumed sphincterotomy, diverticulosis w/o inflamm changes, mod left inguinal hernia containing fat; otherw all WNL- pancreas normal, no adenopathy visualized...          Problem List:     Ex-CIGARETTE SMOKER (ICD-305.1) - started as teen, up to 1/2 ppd, quit for up to 90yrs71yrcently 1/2 ppd but he says he quit 6/11 & has remained quit!  No hx resp tract diseases...  ~  baseline CXRs showed clear, NAD... ~ Marland KitchenCXR 7/11 showed essent clear lungs, +gallstone seen in right abd... ~  CXR 8/13 showed normal heart size, clear lungs, low lung vols & sl elev of right hemidiaph, s/p GB... ~  CXR 8/15 showed norm heart size, clear lungs, NAD. ~  CXR 8/16 showed norm heart size, clear lungs, NAD  PAF >> managed by [name] for Cards- first diagnosed 11/13 when he presented to ER w/ palpit; placed on CCB and converted to NSR... ~  He remains stable on Diltiazem 120 Qam and Flecainide "pill in the pocket" strategy ('100mg'$  prn palpit)... ~  EKG 11/15 showed NSR, rate75, NAD...   HYPERCHOLESTEROLEMIA, BORDERLINE (ICD-272.4) - currently on diet Rx alone. ~  FLP 1/09 showed TChol 198, TG 127, HDL 34, LDL 138 ~  FLP 6/10 showed TChol 170, TG 96, HDL 40, LDL 111 ~  FLP 6/11 showed TChol 184, TG 104, HDL 43, LDL 120... rec low chol/ low fat diet. ~  FLP 8/12 (wt=237#) showed TChol 183, TG 97, HDL 43, LDL 121... Not really  on diet "I just eat" ~  FLP 8/13 (wt=235#) showed TChol 176, TG 78, HDL 41, LDL 120 ~  FLP 8/14 on diet alone (wt=234#) showed TChol 166, TG 78, HDL 41, LDL 109 ~  FLP 8/15 on diet alone (wt=230#) showed TChol 161, TG 117, HDL 37, LDL 100 ~  FLP 8/16 on diet alone (wt=226#) showed TChol 152, TG 88, HDL 41, LDL 93  DYSPEPSIA (ICD-536.8) - takes PRILOSEC OTC daily... no dysphagia, N/V, abd pain, etc... no prev endo etc... states he really needs it daily & has pain/ difficulty if he stops the Rx. ~  S/p lap chole 7/11 by [name] for cholecystitis & stones... ~  2012: he had  recurrent abd pain & saw [name] who did a CT Abd&Pelvis- NEG, NAD (prev cholecystectomy, sm hepatic dome cyst, diverticulosis, NAD); pain felt to be musculoskeletal...  DIVERTICULOSIS OF COLON (ICD-562.10)  ~  He had colonoscopy 1/06 from DrMann= divertics, tiny polyp (path=norm mucosa), hems... ~  F/u colon 11/11 by DrNat-Mann w/ divertics, hems, sm rectal polyps= fragments of benign mucosa only...  ABDOMINAL PAIN & Hx ELEVATED HEPATOCELLULAR ENZYMES >> resolved off MTX ~  2/16: he presented w/ several month hx abd discomfort and seems progressive- now getting "attacks" 3 times per week; states the discomfort is similar to prev GB attacks (he had GB removed by [name] in 2011); c/o lots of indigestion- notes some nausea & vomits on occas then feels better; says appetite is fair, eats ok w/o swallowing issues, then 30 mon later "it sits on my stomach"; he has noted white stool yest & states his urine has been darker; he has been on OTC Omeprazole20 w/o help he says... NOTE> he had recheck by [name] for abd pain 32yrafter GB surg- they did CT Abd and it was NEG (prev cholecystectomy, sm hepatic dome cyst, diverticulosis, NAD)..Marland KitchenMarland KitchenWe placed him on Nexium40Bid, and referred him to GI for further eval & rx... ~  LABS 2/16 revealed> Chems- wnl x GOT=120 GPT=248 (these were prev wnl); AlkPhos=103, Amylase=50 (27-131) &  Lipase=36 (11-59); CBC- wnl;  Sed=5...  ~  Abd Ultrasound 2/16 showed absent GB, ducts ok, poor vis of liver & pancreas, elarged spleen, kidneys ok, no other signif abn noted.. ~  All symptoms resolved off MTX & LFTs ret to normal...  Hx of MONONUCLEOSIS (ICD-075) - in high school... had mono hepatitis... no known sequellae... he also had RMSF at age 67 pretty sick, recovered w/o sequellae. ~  labs have consistently shown normal LFTs x during his cholecystitis presentation 7/11...  RHEUMATOID ARTHRITIS >> Diagnosed w/ sero-pos RA 10/11 & referred to Rheum, [name]- on MTX currently 6/wk & occas Pred taper... ~  8/13:  He tells me that DNelsonvillestarted ENBREL about 6 weeks ago 7 he is already feeling better (we do not have notes from Rheum). ~  8/14:  followed by DGlean Salenon Enbrel & MTX (currently 4/wk)- this has really helped & doing very well on Rx... ~  He continues to f/u w/ Rheum every 3-45moor exam & labs; [facility]... ~  Now on Enbrel alone, off MTX due to elev LFTs...  Hx of DYSPLASTIC NEVUS (ICD-216.9) - removed from base of neck/ upper back by DrHensel/ DrCrowe in 1993... ~  8/13:  He is reminded to check w/ Derm at least yearly due to his fair complexion, red hair, sun exposure, etc... ~  8/14:  He has not yet followed up w/ derm & promises to do so soon...  HEALTH MAINTENANCE:  on ASA '81mg'$  daily... ~  GI:  colonoscopy 1/06 & 11/11 by DrMann as above... ~  GU: neg DRE & PSA here remains wnl... ~  Immunizations:  age [id number]> exsmoker- given PNEUMOVAX 6/10;  given TETANUS shot 6/10; discussed yearly FLU vaccines.   Past Surgical History:  Procedure Laterality Date  . CHOLECYSTECTOMY  2011   laparoscopic  . COLONOSCOPY  01/24/2015   x 3 in past  . dysplastic nevus  07/1991   excised from upper back  . ERCP N/A 02/18/2015   Procedure: ENDOSCOPIC RETROGRADE CHOLANGIOPANCREATOGRAPHY (ERCP);  Surgeon: PaCarol AdaMD;  Location: [facility]  ENDOSCOPY;  Service: Endoscopy;  Laterality: N/A;  . GIVENS CAPSULE STUDY  01/24/2015  . GIVENS CAPSULE STUDY N/A 01/24/2015   Procedure: GIVENS CAPSULE STUDY;  Surgeon: Juanita Craver, MD;  Location: [facility];  Service: Endoscopy;  Laterality: N/A;  . L foot injury  age 73   w/ part amputation great toe  . TONSILLECTOMY AND ADENOIDECTOMY  as a child    Outpatient Encounter Medications as of 08/22/2017  Medication Sig  . Acetaminophen (TYLENOL EXTRA STRENGTH PO) Take by mouth. 2 tabs as needed  . Ascorbic Acid (VITAMIN C) 500 MG tablet Take 500 mg by mouth every morning. once daily with rose hips  . aspirin 81 MG tablet Take 81 mg by mouth once a week.   . Bismuth Subsalicylate (DIARRHEA PO) Take by mouth. [facility] Diarrhea Control as needed  . diltiazem (CARDIZEM CD) 120 MG 24 hr capsule TAKE 1 CAPSULE (120 MG TOTAL) BY MOUTH DAILY.  [name] etanercept (ENBREL) 50 MG/ML injection Inject 50 mg into the skin once a week. On mondays  . folic acid (FOLVITE) 270 MCG tablet Take 400 mcg by mouth daily.  [name] ibuprofen (ADVIL,MOTRIN) 200 MG tablet Take 200 mg by mouth every 8 (eight) hours as needed.  [name] LORazepam (ATIVAN) 1 MG tablet TAKE 1/2 TO 1 TABLET BY MOUTH 3 TIMES A DAY AS DIRECTED  . Multiple Vitamin (MULTIVITAMIN) tablet Take 1 tablet by mouth daily. Men's 50 +  . omeprazole (PRILOSEC) 40 MG capsule Take 40 mg by mouth daily.  [name] oseltamivir (TAMIFLU) 75 MG capsule Take 1 capsule (75 mg total) by mouth 2 (two) times daily.  . [DISCONTINUED] Cholecalciferol (VITAMIN D3) 2000 UNITS CHEW Chew 1 tablet by mouth daily.  . [DISCONTINUED] Magnesium 250 MG TABS Take 250 mg by mouth every other day.  . [DISCONTINUED] pantoprazole (PROTONIX) 40 MG tablet Take 40 mg by mouth daily.    No facility-administered encounter medications on file as of 08/22/2017.     Allergies  Allergen Reactions  . Caffeine Other (See Comments)    Causes him to go into afib  . Flecainide Other (See Comments)    Caused heart to  race too fast and had him in the ED  . Methotrexate Derivatives Nausea And Vomiting    Increased liver enzymes  . Pseudoephedrine-Ibuprofen Other (See Comments)    " afib"   . Penicillins Rash    .Marland KitchenHas patient had a PCN reaction causing immediate rash, facial/tongue/throat swelling, SOB or lightheadedness with hypotension: No, YES TO RASH  Has patient had a PCN reaction causing severe rash involving mucus membranes or skin necrosis: No Has patient had a PCN reaction that required hospitalization No Has patient had a PCN reaction occurring within the last 10 years: No If all of the above answers are "NO", then may proceed with Cephalosporin use.    Immunization History  Administered Date(s) Administered  . Influenza Split 02/22/2011, 03/14/2012, 03/16/2013, 02/08/2014, 02/22/2015  . Influenza, High Dose Seasonal PF 02/21/2017  . Influenza,inj,Quad PF,6+ Mos 01/21/2016  . Pneumococcal Conjugate-13 08/22/2017  . Pneumococcal Polysaccharide-23 10/24/2008  . Td 09/21/2001, 10/24/2008  . Tdap 03/07/2014    Current Medications, Allergies, Past Medical History, Past Surgical History, Family History, and Social History were reviewed in [employer] record.   Review of Systems        See HPI - all other systems neg except as noted... Presents 2/16 c/o abd discomfort, intermit n/v, white stoools and dark urine.Marland KitchenMarland Kitchen  The patient denies anorexia, fever, weight loss, weight gain, vision loss, decreased hearing, hoarseness, chest pain, syncope, dyspnea on exertion, peripheral edema, prolonged cough, headaches, hemoptysis, melena, hematochezia, hematuria, incontinence, muscle weakness, suspicious skin lesions, transient blindness, difficulty walking, depression, unusual weight change, abnormal bleeding, enlarged lymph nodes, and angioedema.   Prev c/o omplains of joint pain, joint swelling, loss of strength, and stiffness (all improved on MTX); denies joint redness, low back  pain, mid back pain, muscle aches, cramps, and thoracic pain.   Objective:   Physical Exam     WD, Overweight, 67 y/o WM in NAD... GENERAL:  Alert & oriented; pleasant & cooperative... HEENT:  [place]/AT, EOM-wnl, PERRLA, Fundi-benign, EACs-clear, TMs-wnl, NOSE-clear, THROAT-clear & wnl. NECK:  Supple w/ full ROM; no JVD; normal carotid impulses w/o bruits; no thyromegaly or nodules palpated; no lymphadenopathy. CHEST:  Clear to P & A; without wheezes/ rales/ or rhonchi. HEART:  Regular Rhythm; without murmurs/ rubs/ or gallops. ABDOMEN:  Soft & non-tender; s/p lap chole, normal bowel sounds; no organomegaly or masses detected. RECTAL:  Neg - prostate 2+ & nontender w/o nodules; stool hematest neg EXT: deformity L great toe, sl swelling & tender MCPs, some IPs, & wrist; no varicose veins/ venous insuffic/ or edema. NEURO:  CN's intact; motor testing normal; sensory testing normal; gait normal & balance OK. DERM:  scar in upper back at base of neck toward R side, no new lesions noted...  RADIOLOGY DATA:  Reviewed in the EPIC EMR & discussed w/ the patient...  LABORATORY DATA:  Reviewed in the EPIC EMR & discussed w/ the patient...   Assessment & Plan:    Episode of near-syncope w/ fall & 2 left ant rib fxs 11/2016, assoc w/ orthostasis and dehydration=> resolved w/ conservative management...  12/24/15> URI/ Bronchitis-- Rx w/ Depo, Levaquin, Mucinex, align... 03/2016> similar symptoms resolved w/ ZPak... 11/2016>  LLL pulm nodule and mild hilar/subcarinal adenopathy on CT Chest done for fall w/ ant left CWP => f/u scan planned for Oct2018... 02/24/17>   Yaron is clinically stable on his current regimen; f/u CT Chest shows similar findings in the LNs, 39m periph nodule in LLL and the 174mLUL cystic lesion; we will proceed w/ PET as discussed. 08/22/17>   RaColsons clinically stable, doing well- he is due for his f/u CT scans- as below, nothing acute or progressive & we continue to follow;  Given  Prevnar-13 today & rec to continue same meds w/ ROV 90m18moPAF>  New prob 11/13- followed by [name] on Cardizem & Flecainide prn...  CHOL>  As noted w/ LDL down to 93 - needs better diet, wt reduction or consider low dose statin rx, he will decide...  Dyspepsia>  On Nexium daily...  Divertics/ sm polyps>  follwed by DrNat-Mann & up to date on colon screening etc...  Hx of abd discomfort assoc w/ "attacks" of pain, indigestion, & intermit n/v; Labs w/ elev GOT/GPT; Sonar w/ splenomegaly but poor vis of liver/pancreas; placed on [id number] & referred to GI for further eval &rx => all symptoms resolved off MTX and LFTs ret ro normal...  Urinary symptoms 10/15>> hx is somewhat vague & seems minor; min tender over bladder otherw norm exam; Labs & UA are clear/wnl; Rec Fluids, Cranberry juice, tylenol prn, & observe; symptoms resolved...  Sero-positive RA>  Followed by DrBGlean Salen ENBREL & now off MTX...  Other medical problems as noted...  CPX>  He quit smoking 6/11;  Baseline EKG w/ NSR, NSSTTWA, NAD;  Baseline CXR = NAD; Labs look good as well...    Patient's Medications  New Prescriptions   No medications on file  Previous Medications   ACETAMINOPHEN (TYLENOL EXTRA STRENGTH PO)    Take by mouth. 2 tabs as needed   ASCORBIC ACID (VITAMIN C) 500 MG TABLET    Take 500 mg by mouth every morning. once daily with rose hips   ASPIRIN 81 MG TABLET    Take 81 mg by mouth once a week.    BISMUTH SUBSALICYLATE (DIARRHEA PO)    Take by mouth. [facility] Diarrhea Control as needed   DILTIAZEM (CARDIZEM CD) 120 MG 24 HR CAPSULE    TAKE 1 CAPSULE (120 MG TOTAL) BY MOUTH DAILY.   ETANERCEPT (ENBREL) 50 MG/ML INJECTION    Inject 50 mg into the skin once a week. On mondays   FOLIC ACID (FOLVITE) 594 MCG TABLET    Take 400 mcg by mouth daily.   IBUPROFEN (ADVIL,MOTRIN) 200 MG TABLET    Take 200 mg by mouth every 8 (eight) hours as needed.   LORAZEPAM (ATIVAN) 1 MG TABLET    TAKE 1/2 TO 1 TABLET BY  MOUTH 3 TIMES A DAY AS DIRECTED   MULTIPLE VITAMIN (MULTIVITAMIN) TABLET    Take 1 tablet by mouth daily. Men's 50 +   OMEPRAZOLE (PRILOSEC) 40 MG CAPSULE    Take 40 mg by mouth daily.   OSELTAMIVIR (TAMIFLU) 75 MG CAPSULE    Take 1 capsule (75 mg total) by mouth 2 (two) times daily.  Modified Medications   No medications on file  Discontinued Medications   CHOLECALCIFEROL (VITAMIN D3) 2000 UNITS CHEW    Chew 1 tablet by mouth daily.   MAGNESIUM 250 MG TABS    Take 250 mg by mouth every other day.   PANTOPRAZOLE (PROTONIX) 40 MG TABLET    Take 40 mg by mouth daily.

## 2017-08-22 NOTE — Patient Instructions (Signed)
Today we updated your med list in our EPIC system...    Continue your current medications the same...  Today we gave you the 2nd of the recommended PNEUMONIA vaccines - PREVNAR-13 (one & done)  We will arrange for the follow up CT Chest/ Abd/ Pelvis as prev recommended by the Radiologists after your PET scan in Oct2018...    We will contact you w/ the results when available...   Keep up your goood work w/ diet & exercise...  Call for any questions...  Let's plan a follow up visit in 31mo, sooner if needed for problems.Marland KitchenMarland Kitchen

## 2017-09-06 ENCOUNTER — Ambulatory Visit (INDEPENDENT_AMBULATORY_CARE_PROVIDER_SITE_OTHER)
Admission: RE | Admit: 2017-09-06 | Discharge: 2017-09-06 | Disposition: A | Discharge status: Home / Self Care | Payer: Medicare Other | Source: Ambulatory Visit | Attending: Pulmonary Disease | Admitting: Pulmonary Disease

## 2017-09-06 DIAGNOSIS — R911 Solitary pulmonary nodule: Secondary | ICD-10-CM

## 2017-09-06 DIAGNOSIS — R599 Enlarged lymph nodes, unspecified: Secondary | ICD-10-CM

## 2017-09-06 DIAGNOSIS — R140 Abdominal distension (gaseous): Secondary | ICD-10-CM | POA: Diagnosis not present

## 2017-09-06 DIAGNOSIS — R918 Other nonspecific abnormal finding of lung field: Secondary | ICD-10-CM | POA: Diagnosis not present

## 2017-09-06 MED ORDER — IOPAMIDOL (ISOVUE-300) INJECTION 61%
100.0000 mL | Freq: Once | INTRAVENOUS | Status: AC | PRN
  Administered 2017-09-06: 100 mL via INTRAVENOUS

## 2017-10-05 ENCOUNTER — Telehealth: Payer: Self-pay | Admitting: Pulmonary Disease

## 2017-10-05 MED ORDER — DOXYCYCLINE HYCLATE 100 MG PO TABS: 100.0000 mg | ORAL_TABLET | Freq: Two times a day (BID) | ORAL | 0 refills | Status: DC

## 2017-10-05 NOTE — Telephone Encounter (Signed)
Per SN:   We should treat with an antibiotic. Doxy 100mg  bid with a quantity of 20.  Called and spoke to pt stating to him that I was sending script in for him.  Pt expressed understanding. Nothing further needed at this time.

## 2017-10-05 NOTE — Telephone Encounter (Signed)
Pt was bitten on the back by a tick on Saturday.  Pt put an alcohol-soaked cotton swab on it for a few minutes and was able to remove the entire tick with tweezers.  Notes that the area is red and itchy. Pt has been applying OTC topical Benadryl to affected area, wants to know if anything further is needed.   Pharmacy: [facility] in [place].    SN please advise on recs.  Thanks!

## 2017-11-14 ENCOUNTER — Other Ambulatory Visit: Payer: Self-pay | Admitting: Pulmonary Disease

## 2017-12-09 ENCOUNTER — Telehealth: Payer: Self-pay | Admitting: Pulmonary Disease

## 2017-12-09 NOTE — Telephone Encounter (Signed)
Attempted to contact pt. Line rang busy x2. Will try back.

## 2017-12-13 NOTE — Telephone Encounter (Signed)
Called and spoke with patient regarding hearing white noise in rt ear. Pt completely lost hearing in left ear due to white noise in lf ear Pt is aware that SN is out of the office next 2 weeks Pt called [facility], has an appt next week. Nothing further needed.

## 2017-12-28 DIAGNOSIS — H903 Sensorineural hearing loss, bilateral: Secondary | ICD-10-CM | POA: Diagnosis not present

## 2017-12-28 DIAGNOSIS — H9313 Tinnitus, bilateral: Secondary | ICD-10-CM | POA: Diagnosis not present

## 2017-12-28 DIAGNOSIS — H9312 Tinnitus, left ear: Secondary | ICD-10-CM | POA: Diagnosis not present

## 2018-01-16 DIAGNOSIS — E663 Overweight: Secondary | ICD-10-CM | POA: Diagnosis not present

## 2018-01-16 DIAGNOSIS — Z79899 Other long term (current) drug therapy: Secondary | ICD-10-CM | POA: Diagnosis not present

## 2018-01-16 DIAGNOSIS — M0579 Rheumatoid arthritis with rheumatoid factor of multiple sites without organ or systems involvement: Secondary | ICD-10-CM | POA: Diagnosis not present

## 2018-01-16 DIAGNOSIS — Z6828 Body mass index (BMI) 28.0-28.9, adult: Secondary | ICD-10-CM | POA: Diagnosis not present

## 2018-01-19 DIAGNOSIS — S20462A Insect bite (nonvenomous) of left back wall of thorax, initial encounter: Secondary | ICD-10-CM | POA: Diagnosis not present

## 2018-01-26 ENCOUNTER — Encounter: Payer: Self-pay | Admitting: Internal Medicine

## 2018-01-26 ENCOUNTER — Ambulatory Visit (INDEPENDENT_AMBULATORY_CARE_PROVIDER_SITE_OTHER): Payer: Medicare Other | Admitting: Internal Medicine

## 2018-01-26 DIAGNOSIS — I482 Chronic atrial fibrillation, unspecified: Secondary | ICD-10-CM

## 2018-01-26 DIAGNOSIS — I951 Orthostatic hypotension: Secondary | ICD-10-CM | POA: Diagnosis not present

## 2018-01-26 DIAGNOSIS — I480 Paroxysmal atrial fibrillation: Secondary | ICD-10-CM

## 2018-01-26 NOTE — Patient Instructions (Signed)

## 2018-01-26 NOTE — Progress Notes (Signed)
HPI [name] returns today for follow-up and for evaluation of syncope. He is a pleasant 67 year old man with paroxysmal atrial fibrillation, hypertension, and relative intolerance to flecainide. The patient has been well-controlled with regard to his atrial fibrillation in the past. He has had episodes of syncope which were thought to be mostly related to autonomic dysfunction.  Allergies  Allergen Reactions  . Caffeine Other (See Comments)    Causes him to go into afib  . Flecainide Other (See Comments)    Caused heart to race too fast and had him in the ED  . Methotrexate Derivatives Nausea And Vomiting    Increased liver enzymes  . Pseudoephedrine-Ibuprofen Other (See Comments)    " afib"   . Penicillins Rash    .Marland KitchenHas patient had a PCN reaction causing immediate rash, facial/tongue/throat swelling, SOB or lightheadedness with hypotension: No, YES TO RASH  Has patient had a PCN reaction causing severe rash involving mucus membranes or skin necrosis: No Has patient had a PCN reaction that required hospitalization No Has patient had a PCN reaction occurring within the last 10 years: No If all of the above answers are "NO", then may proceed with Cephalosporin use.     Current Outpatient Medications  Medication Sig Dispense Refill  . Acetaminophen (TYLENOL EXTRA STRENGTH PO) Take by mouth. 2 tabs as needed    . aspirin 81 MG tablet Take 81 mg by mouth once a week.     . Bismuth Subsalicylate (DIARRHEA PO) Take by mouth. [facility] Diarrhea Control as needed    . diltiazem (CARDIZEM CD) 120 MG 24 hr capsule TAKE 1 CAPSULE (120 MG TOTAL) BY MOUTH DAILY. 90 capsule 3  . doxycycline (VIBRA-TABS) 100 MG tablet Take 1 tablet (100 mg total) by mouth 2 (two) times daily. 20 tablet 0  . etanercept (ENBREL) 50 MG/ML injection Inject 50 mg into the skin once a week. On mondays    . ibuprofen (ADVIL,MOTRIN) 200 MG tablet Take 200 mg by mouth every 8 (eight) hours as needed.    [name]  LORazepam (ATIVAN) 1 MG tablet TAKE 1/2 TO 1 TABLET BY MOUTH 3 TIMES A DAY AS DIRECTED 90 tablet 0  . omeprazole (PRILOSEC) 40 MG capsule Take 40 mg by mouth daily.    [name] oseltamivir (TAMIFLU) 75 MG capsule Take 1 capsule (75 mg total) by mouth 2 (two) times daily. 10 capsule 0   No current facility-administered medications for this visit.      Past Medical History:  Diagnosis Date  . A-fib ([place])   . Acute arthritis   . Allergic rhinitis   . Atrial fibrillation ([place])    history of a fib  . Dermatitis   . Diverticulosis of colon   . Dysplastic nevus   . GERD (gastroesophageal reflux disease)   . GI bleeding 01/24/2015  . Hypercholesterolemia   . Mononucleosis   . RA (rheumatoid arthritis) ([place])   . Tobacco use disorder     ROS:   All systems reviewed and negative except as noted in the HPI.   Past Surgical History:  Procedure Laterality Date  . CHOLECYSTECTOMY  2011   laparoscopic  . COLONOSCOPY  01/24/2015   x 3 in past  . dysplastic nevus  07/1991   excised from upper back  . ERCP N/A 02/18/2015   Procedure: ENDOSCOPIC RETROGRADE CHOLANGIOPANCREATOGRAPHY (ERCP);  Surgeon: Carol Ada, MD;  Location: [facility];  Service: Endoscopy;  Laterality: N/A;  . GIVENS CAPSULE STUDY  01/24/2015  . GIVENS CAPSULE STUDY N/A 01/24/2015   Procedure: GIVENS CAPSULE STUDY;  Surgeon: Juanita Craver, MD;  Location: [facility];  Service: Endoscopy;  Laterality: N/A;  . L foot injury  age 21   w/ part amputation great toe  . TONSILLECTOMY AND ADENOIDECTOMY  as a child     Family History  Problem Relation Age of Onset  . Prostate cancer Father   . Other Father        pacemaker  . Heart disease Mother   . Other Mother        vascular disease     Social History   Socioeconomic History  . Marital status: Married    Spouse name: Jeani Hawking x 38 yrs  . Number of children: 2  . Years of education: Not on file  . Highest education level: Not on file  Occupational History  .  Occupation: [employer]  Social Needs  . Financial resource strain: Not on file  . Food insecurity:    Worry: Not on file    Inability: Not on file  . Transportation needs:    Medical: Not on file    Non-medical: Not on file  Tobacco Use  . Smoking status: Former Smoker    Packs/day: 0.50    Years: 45.00    Pack years: 22.50    Types: Cigarettes    Last attempt to quit: 11/13/2009    Years since quitting: 8.2  . Smokeless tobacco: Never Used  Substance and Sexual Activity  . Alcohol use: No    Alcohol/week: 0.0 standard drinks  . Drug use: No  . Sexual activity: Never  Lifestyle  . Physical activity:    Days per week: Not on file    Minutes per session: Not on file  . Stress: Not on file  Relationships  . Social connections:    Talks on phone: Not on file    Gets together: Not on file    Attends religious service: Not on file    Active member of club or organization: Not on file    Attends meetings of clubs or organizations: Not on file    Relationship status: Not on file  . Intimate partner violence:    Fear of current or ex partner: Not on file    Emotionally abused: Not on file    Physically abused: Not on file    Forced sexual activity: Not on file  Other Topics Concern  . Not on file  Social History Narrative   One son with HBP   One son with IDDM      May return soon     BP 126/70   Pulse 70   Ht 5\' 11"  (1.803 m)   Wt 210 lb (95.3 kg)   SpO2 98%   BMI 29.29 kg/m   Physical Exam:  Well appearing 67 yo man, NAD HEENT: Unremarkable Neck:  6 cm JVD, no thyromegally Lymphatics:  No adenopathy Back:  No CVA tenderness Lungs:  Clear with no wheezes HEART:  Regular rate rhythm, no murmurs, no rubs, no clicks Abd:  soft, positive bowel sounds, no organomegally, no rebound, no guarding Ext:  2 plus pulses, no edema, no cyanosis, no clubbing Skin:  No rashes no nodules Neuro:  CN II through XII intact, motor grossly intact  DEVICE  Normal device  function.  See PaceArt for details.   Assess/Plan: 1. PAF - he is doing well. He has had 3 episodes in the last  3 months lasting 1-2 hours.  2. HTN - his blood pressure is reasonably well controlled.  3. Coags - he has a CHADSVASC score of 2 but he has been hospitalized with a GI bleed on ASA. We discussed [facility] but he is not interested in pursuing this at the present time and is not willing to take an [place]. 4. Syncope - he has not had any episodes in the past 6 months. He will continue his current meds.  William West.D.

## 2018-02-07 DIAGNOSIS — H5201 Hypermetropia, right eye: Secondary | ICD-10-CM | POA: Diagnosis not present

## 2018-02-07 DIAGNOSIS — H52223 Regular astigmatism, bilateral: Secondary | ICD-10-CM | POA: Diagnosis not present

## 2018-02-07 DIAGNOSIS — H524 Presbyopia: Secondary | ICD-10-CM | POA: Diagnosis not present

## 2018-02-07 DIAGNOSIS — H25043 Posterior subcapsular polar age-related cataract, bilateral: Secondary | ICD-10-CM | POA: Diagnosis not present

## 2018-02-23 ENCOUNTER — Ambulatory Visit: Payer: Medicare Other | Admitting: Pulmonary Disease

## 2018-03-02 ENCOUNTER — Ambulatory Visit (INDEPENDENT_AMBULATORY_CARE_PROVIDER_SITE_OTHER): Payer: Medicare Other

## 2018-03-02 ENCOUNTER — Other Ambulatory Visit (INDEPENDENT_AMBULATORY_CARE_PROVIDER_SITE_OTHER): Payer: Medicare Other

## 2018-03-02 ENCOUNTER — Ambulatory Visit (INDEPENDENT_AMBULATORY_CARE_PROVIDER_SITE_OTHER): Payer: Medicare Other | Admitting: Pulmonary Disease

## 2018-03-02 ENCOUNTER — Encounter: Payer: Self-pay | Admitting: Pulmonary Disease

## 2018-03-02 VITALS — BP 118/70 | HR 81 | Temp 97.7°F | Ht 71.0 in | Wt 207.8 lb

## 2018-03-02 DIAGNOSIS — Z23 Encounter for immunization: Secondary | ICD-10-CM

## 2018-03-02 DIAGNOSIS — M069 Rheumatoid arthritis, unspecified: Secondary | ICD-10-CM | POA: Diagnosis not present

## 2018-03-02 DIAGNOSIS — E7800 Pure hypercholesterolemia, unspecified: Secondary | ICD-10-CM | POA: Diagnosis not present

## 2018-03-02 DIAGNOSIS — I480 Paroxysmal atrial fibrillation: Secondary | ICD-10-CM | POA: Diagnosis not present

## 2018-03-02 DIAGNOSIS — R9389 Abnormal findings on diagnostic imaging of other specified body structures: Secondary | ICD-10-CM

## 2018-03-02 DIAGNOSIS — R911 Solitary pulmonary nodule: Secondary | ICD-10-CM

## 2018-03-02 DIAGNOSIS — Z0000 Encounter for general adult medical examination without abnormal findings: Secondary | ICD-10-CM

## 2018-03-02 DIAGNOSIS — Z87891 Personal history of nicotine dependence: Secondary | ICD-10-CM

## 2018-03-02 LAB — CBC WITH DIFFERENTIAL/PLATELET
Basophils Absolute: 0.1 10*3/uL (ref 0.0–0.1)
Basophils Relative: 1.1 % (ref 0.0–3.0)
EOS PCT: 3.2 % (ref 0.0–5.0)
Eosinophils Absolute: 0.2 10*3/uL (ref 0.0–0.7)
HCT: 43.7 % (ref 39.0–52.0)
Hemoglobin: 15.2 g/dL (ref 13.0–17.0)
LYMPHS ABS: 1.4 10*3/uL (ref 0.7–4.0)
Lymphocytes Relative: 22.7 % (ref 12.0–46.0)
MCHC: 34.7 g/dL (ref 30.0–36.0)
MCV: 86.4 fl (ref 78.0–100.0)
MONOS PCT: 9.4 % (ref 3.0–12.0)
Monocytes Absolute: 0.6 10*3/uL (ref 0.1–1.0)
NEUTROS ABS: 3.8 10*3/uL (ref 1.4–7.7)
NEUTROS PCT: 63.6 % (ref 43.0–77.0)
PLATELETS: 231 10*3/uL (ref 150.0–400.0)
RBC: 5.06 Mil/uL (ref 4.22–5.81)
RDW: 14.6 % (ref 11.5–15.5)
WBC: 6 10*3/uL (ref 4.0–10.5)

## 2018-03-02 LAB — COMPREHENSIVE METABOLIC PANEL
ALBUMIN: 4.5 g/dL (ref 3.5–5.2)
ALK PHOS: 54 U/L (ref 39–117)
ALT: 19 U/L (ref 0–53)
AST: 20 U/L (ref 0–37)
BUN: 13 mg/dL (ref 6–23)
CALCIUM: 9.8 mg/dL (ref 8.4–10.5)
CHLORIDE: 105 meq/L (ref 96–112)
CO2: 30 mEq/L (ref 19–32)
CREATININE: 1.16 mg/dL (ref 0.40–1.50)
GFR: 66.82 mL/min (ref >60.00)
Glucose, Bld: 104 mg/dL — ABNORMAL HIGH (ref 70–99)
Potassium: 4.5 mEq/L (ref 3.5–5.1)
Sodium: 141 mEq/L (ref 135–145)
TOTAL PROTEIN: 6.9 g/dL (ref 6.0–8.3)
Total Bilirubin: 1 mg/dL (ref 0.2–1.2)

## 2018-03-02 LAB — LIPID PANEL
Cholesterol: 141 mg/dL (ref 0–200)
HDL: 48.8 mg/dL (ref >39.00)
LDL Cholesterol: 75 mg/dL (ref 0–99)
NonHDL: 92.2
Total CHOL/HDL Ratio: 3
Triglycerides: 87 mg/dL (ref 0.0–149.0)
VLDL: 17.4 mg/dL (ref 0.0–40.0)

## 2018-03-02 LAB — TSH: TSH: 2.54 u[IU]/mL (ref 0.35–4.50)

## 2018-03-02 LAB — PSA: PSA: 1.45 ng/mL (ref 0.10–4.00)

## 2018-03-02 MED ORDER — LORAZEPAM 1 MG PO TABS: ORAL_TABLET | ORAL | 3 refills | Status: DC

## 2018-03-02 NOTE — Patient Instructions (Signed)
Today we updated your med list in our EPIC system...    Continue your current medications the same...  Today we did your follow up FASTING blood work...    We will contact you w/ the results when available...   We gave you the 2019 FLU vaccine as well...  I would like you to follow up w/ my young partner, DrByrum, in about 6 months...    You will be due a follow up CT Chest at that time...  We discussed options for obtaining a new Primary Care physician as well...  William West,  It has been my great honor to have been one of your doctors over these many years!!!    My best wishes to you & yours-  Wishing you good healt & much happiness in the yrs to come.Marland KitchenMarland Kitchen

## 2018-03-03 ENCOUNTER — Encounter: Payer: Self-pay | Admitting: Pulmonary Disease

## 2018-03-03 ENCOUNTER — Other Ambulatory Visit: Payer: Self-pay | Admitting: Pulmonary Disease

## 2018-03-03 DIAGNOSIS — R9389 Abnormal findings on diagnostic imaging of other specified body structures: Secondary | ICD-10-CM | POA: Insufficient documentation

## 2018-03-03 NOTE — Progress Notes (Signed)
Subjective:    Patient ID: William West, male    DOB: 1950-09-28, 67 y.o.   MRN: [id number]  HPI 67 y/o WM, husb of William West, here for a follow up visit & CPX... ~  SEE PREV EPIC NOTES FOR OLDER DATA >>    CXR 8/13 showed normal heart size, clear lungs, low lung vols & sl elev of right hemidiaph, s/p GB...  EKG 8/13 showed NSR, rate62, wnl, NAD...  LABS 8/13:  FLP- ok x LDL=120 on diet alone;  Chems- wnl;  CBC- wnl;  TSH=1.81;  PSA=0.62   LABS 8/14:  FLP- at goals on diet x LDL=109;  Chems- wnl;  CBC- wnl;  TSH=1.90;  PSA=0.67...  ~  January 14, 2014:  Yearly ROV & CPX> William West reports a good yr overall; he continues to see William West for RA on Enbrel & MTX every 3-2mofor labs and OVs (we do not have recent notes from them);  He notes sl cough w/ greenish sput in the AMs but no f/c/s, chest discomfort, or SOB; we discussed ZPak Mucinex, Fluids for prn use;  He also had a bout of diarrhea 6/15=> went to ER w/ neg stoll studies, treated empirically w/ Cipro/Flagyl & symptoms resolved...     Ex-smoker> he quit in 2011 & has remained off cigs; breathing is good he says, no issues; exerc w/ yard work, splits wood, etc; CXR is clear...    PAF> on Diltiazem120 & Flecainide prn per William West "pill in the pocket" regimen (see below); he has used this med on only one occas during the last yr he says...    Chol> on diet alone & wt=230# (unchanged); FLP is stable w/ TChol 161, TG 117, HDL 37, LDL 100; we reviewed diet & wt reduction strategies...    Dyspepsia> on Prilosec 254md & this helps to control his symptoms; he saw William [facility] 11/12 at [facility] for RUQ pain after lifting- CT was neg & the pain was felt to be musculoskeletal pain; s/p lap chole 7/11 for stones...    [facility] colon 2011 was neg & f/u should be 7-1058yr.    RA> followed by DrBGlean West 3-22mo44moEnbrel & MTX (currently 4/wk)- this has really helped & doing very well on Rx; we do not have notes or labs  from Rheum...    Hx dysplastic nevus, fair skinned/ red hair> advised to get Derm eval at least yearly & he promises to do so... We reviewed prob list, meds, xrays and labs> see below for updates >>   CXR 8/15 showed norm heart size, clear lungs, NAD...  LABS 6/15 via ER>  Chems- wnl;  CBC- wnl   LABS 8/15:  FLP- ok on diet alone;  TSH= 1.86;  PSA= 0.87   ~  March 05, 2014:  6wk ROV & add-on appt requested for 5d hx pain, tenderness, discomfort in suprapubic area but denies dysuria, hematuria, foul smelling urine, etc;  He notes "pressure" but no burning, no flankl or back pain, etc and he notes urine stream appears ok w/ good force;  Initially he felt chilled, shakey, but didn't take temp- he notes that tylenol helped;  States he had similar episode 1-2 yrs ago & symptoms resolved w/ meds called in for him at that time...  Exam is essentially neg- except min tender over bladder, neg CVAT, prostate is 2+ and normal, stool heme neg... We decided to check UA (clear), and Labs (all wnl- chems, CBC, Sed); and  cover empirically w/ Fluids and cranberry juice, ok to continue Tylenol, we are holding antibiotics until C&S returns... We reviewed prob list, meds, xrays and labs> see below for updates >>   LABS 10/15:  Chems- wnl w/ Cr=1.1;  CBC- wnl w/ Hg=13.9 & WBC=7.7;  Sed=17;  UA- clear w/ C&S pending...   ~  July 02, 2014:  58moROV & add-on appt requested for Abd Pain> states this is going on for several months and seems progressive- now getting "attacks" 3 times per week; states the discomfort is similar to prev GB attacks (he had GB removed by William West in 2011); c/o lots of indigestion- notes some nausea & vomits on occas then feels better; says appetite is fair, eats ok w/o swallowing issues, then 30 mon later "it sits on my stomach"; he has noted white stool yest & states his urine has been darker; he has been on OTC Omeprazole20 w/o help he says... NOTE> he had recheck by William West for abd  pain 145yrfter GB surg- they did CT Abd and it was NEG (prev cholecystectomy, sm hepatic dome cyst, diverticulosis, NAD)...William KitchenMarland West  He has hx PAF & is followed by William West> last seen 11/15 & said to be doing well on the "pill in the pocket"strategy for control; rare palpit, no CP/ SOB; on ASA81, Diltiazem12058mam and Flecainide 100m15m needed for AFib (hasn't needed)...    He is followed by William West Rheum Q3-19mo>619moRA on Enbrel & MTX => we do not have notes from him... We reviewed prob list, meds, xrays and labs> see below for updates >>   LABS 2/16:  Chems- wnl x GOT=120 GPT=248 (these were prev wnl);  AlkPhos=103, Amylase=50 (27-131) & Lipase=36 (11-59);  CBC- wnl;  Sed=5...   Abd Ultrasound 2/16 showed absent GB, ducts ok, poor vis of liver & pancreas, elarged spleen, kidneys ok, no other signif abn noted... PLAN>> Hx progressive abd discomfort w/ "attacks" of pain & some n/v; Labs w/ abn GOT/GPT but norm AlkPhos, Amylase/Lipase, and Sed=5; Sonar w/ splenomegaly, poor vis of liver & pancreas; we discussed Rx w/ stronger PPI for now- Nexium40 bid and refer to GI for further evaluation (?CT Abd and EGD?) and rx... ADDENDUM>> Pt called 07/22/14 w/ update- now on Enbrel & off MTX, LFTs are normal, appetite is good & RA under control, on Nexium40 before dinner & this is helping he says...  ~  January 16, 2015:  19mo R5mo William Leamanre for his CPX> prev symptoms resolved and LFTs ret to normal off MTX;  Now feeling well, breathing is good w/o cough/ sput/ SOB etc... We reviewed the following medical problems during today's office visit >>     Ex-smoker> he quit in 2011 & has remained off cigs; breathing is good he says, no issues; exerc w/ yard work, splits wood, etc; CXR is clear...    PAF> on Diltiazem120 & off Flecainide (prn per William West "pill in the pocket" regimen); he has used this med on only one occas during the last few yrs.    Chol> on diet alone & wt=226#; FLP is stable w/ TChol 152, TG 88,  HDL 41, LDL 93; we reviewed diet & wt reduction strategies...    Dyspepsia> on Nexium 40mg/d58mhis helps to control his symptoms; he saw [facility] 11/12 at [facility] for RUQ pain after lifting- CT was neg & the pain was felt to be musculoskeletal pain; s/p lap chole 7/11 for stones; GI symptoms 2/16  resolved off MTX... ...    Divertics> [facility]; colon 2011 was neg & f/u should be 7-47yr...    RA> followed by DGlean Salenevery 3-647mon Enbrel & off MTX now- doing very well on monotherapy; we do not have notes or labs from Rheum...    Hx dysplastic nevus, fair skinned/ red hair> advised to get Derm eval at least yearly & he promises to do so... We reviewed prob list, meds, xrays and labs> see below for updates >> up to date on Immuniz...  CXR 8/16 showed norm heart size, clear lungs, NAD...   LABS 8/16:  FLP- at goals on diet alone;  Chems- wnl;  CBC- wnl;  TSH=1.63;  PSA=0.80;  Mg=2.1 (wnl)...   ~  December 24, 2015:  1y65yrV & add-on appt requested for URI symptoms> RanSimss a CPX sched for 49mo68mom now but called for this add-on appt due to a URI-- c/o sore throat, sinus drainage, cough, green sput, feeling bad, HA, ?temp 99/ +chills/ no sweats, grad onset over 1wk after exposure to grandkids... Meds include:  CardizemCD120, Nexium40, Ativan1mg 47m, and ENBREL50/wk per William West;  He is allergic to PCN... EXAM shows Afeb, VSS, O2sat=96% on RA;  HEENT- sl red, no exud, no adenopathy;  Chest- clear w/o w/r/r;  Heart- RR w/o m/r/g;  Abd- soft, nontender, neg;  Ext- neg w/o c/c/e;  Nuero- intact w/o focal abn...  CXR 12/24/15> norm heart size, calcif in Ao arch, clear lungs w/ sl prom interstitial markings in LLL, NAD...   LABS 12/24/15>  Chems- wnl, BS=105;  CBC- ok w/ Hg=13.1, WBC=11.9 w/ 73%segs;  TSH=1.93 IMP/PLAN>>  URI, acute bronchitis-- we discussed Rx w/ Depo120, Levaquin500 x7d, Mucinex600-2Bid + fluids, rec OTC Align/ Activia yogurt etc...   ~  January 21, 2016:  3wk ROV & pt is  here for his yearly ROV recheck>  Ronon's prev bronchitis has resolved, no GI issues, and back to baseline- doing satis w/o any new complaints or concerns; He is concerned because a friend was Dx w/ Lung CaWe reviewed the following medical problems during today's office visit >>     Ex-smoker> he quit in 2011 & has remained off cigs; breathing is good he says, no issues; exerc w/ yard work, splits wood, etc; acute bronchitis episode 12/2015- resolved w/ Levaquin; f/u CXR is clear...    PAF> on Diltiazem120 & off Flecainide (prn per William West- he takes extra Diltiazem "prn"); he has used this extra med several times over the last yr; he says that William West told him not to take ASA (?due to bleeding)...    Chol> on diet alone & wt=220#; FLP 8/17 is stable w/ TChol 141, TG 84, HDL 49, LDL 75; we reviewed diet & wt reduction strategies...    Dyspepsia> on Nexium 40mg/44mthis helps to control his symptoms; he saw William [facility] 11/12 at [facility] for RUQ pain after lifting- CT was neg & the pain was felt to be musculoskeletal pain; s/p lap chole 7/11 for stones; GI symptoms 2/16 resolved off MTX... ...    [facility] 2011 was neg & f/u should be 7-21yrs.71yr Urology> he saw DrMcKenzie 08/2015 for blood in semen- cleared spont, no recurrence, UA was clear; hematospermia felt to be benign & f/u prn...    RA> followed by DrBeekmGlean Salen3-70mo on 70moel & off MTX now- doing very well on monotherapy; last note 11/19/15 reviewed- RA of mult sites, on  Enbrel, pos Rheum factor, doing satis,     Hx dysplastic nevus, fair skinned/ red hair> advised to get Derm eval at least yearly & he promises to do so... EXAM shows Afeb, VSS, O2sat=100% on RA;  HEENT- sl red, no exud, no adenopathy;  Chest- clear w/o w/r/r;  Heart- RR w/o m/r/g;  Abd- soft, nontender, neg;  Ext- neg w/o c/c/e;  Nuero- intact w/o focal abn...  CXR 01/21/16>  Norm heart size, Ao atherosclerosis, clear lungs, NAD...  LABS 01/21/16>   FLP- wnl on diet alone;  PSA=2.15 => NOTE: this is up from 0.80 last yr & we will recheck in 40mo IMP/PLAN>>  RDabneyhas recovered from his bronchitis, other problems stable, note incr PSA velocity & we will recheck PSA in 650mo.  ~  April 19, 2016:  50m489moV & pulm recheck> RanJavohnw SG on 04/01/17 for an acute upper resp infection- sore throat, post naasl drip, cough w/ sm amt yellow sput, exposed to grandchild w/ URI, he takes Enbrel for RA; treated w/ ZPak & also given Neosporin ophthalmic drops for pink-eye;  Asked to f/u w/ me today- improved & URI symptoms resolved...    Today RanAleks c/o some dumping syndrome after eating, notes this is persistent after GB surg;  Also c/o right sided siatica and insomnia;  Notes considerable stress w/ leaves in his yard, prob w/ coyotes in his area (has a few farm animals/ cows; he needs to de-stress his life... Rec to use metamucil, offered Alpraz but he doesn't want new meds...    EXAM shows Afeb, VSS, O2sat=100% on RA;  HEENT- sl red, no exud, no adenopathy;  Chest- clear w/o w/r/r;  Heart- RR w/o m/r/g;  Abd- soft, nontender, neg;  Ext- neg w/o c/c/e;  Neuro- intact w/o focal abn... IMP/PLAN>>  Treyson's URI has resolved;  Other issues discussed- try Metamucil for dumping symptoms after eating, use Advil/ Tylenol/ heating pad for back discomfort w/ furthe eval id symptom persists; he has Melatonin and Ativan to help w/ rest/ stress... He'll be due for a follow up PSA in the spring...   ~  December 09, 2016:  89mo52mo & post- emergency room eval>  RandTillmont to the ER 12/02/16 after awakening ~7AM to go to the bathroom, felt lightheaded & before he could sit down he fell & struck his left chest on the commode w/ sharp left CP, felt weak w/ sweating, & it took him 5min40m get up; he did not hit his head or lose consciousness; no HA, neck pain, SOB, etc... ER eval revealed sl orthostatic, mild dehydration, left 10th/ 11th ant rib fxs; given IVF, Zofran, MS, and Tramadol &  asked to f/u here...  Since the Er visit he is feeling better, no longer postural/ weak/ dizzy/ etc;  We discussed his presentation & the findings (including incidental findings) & need for f/u as outlined below...     He last saw William West, CARDS-ep 04/29/16>  HBP, PAF, RA-- he uses Diltiazem120, CHADSVASC is zero, asked to f/u 68yr..17yr He continues to see William West regularly for Rheum>  RA- seropos on ENBREL; last note scanned into epic 02/2016 & he is asked to request notes be sent to us to [place] into computer;   EXAM shows Afeb, VSS, O2sat=100% on RA;  HEENT- sl red, no exud, no adenopathy;  Chest- clear w/o w/r/r;  Heart- RR w/o m/r/g;  Abd- soft, nontender, neg;  Ext- neg w/o c/c/e;  Neuro- intact  w/o focal abn..  CXR 12/02/16 (independently reviewed by me in the PACS system) showed norm heart size, mild basilar atx, no pneumonia, 2 lower left ant rib fxs...   EKG 12/02/16 showed NSR, rate 80, wnl, sl late transition...  CT Chest 12/02/16>  Norm heart size, NEG for PE, coronary & Ao atherosclerosis, +prom bilat hilar & subcarinal LNs (17m), 968mlobulated pulm nodule in LLL, irreg cyst in Lingula ~1cm, nondisplaced lateral 10th & 11th rib fxs...  CT Abd & Pelvis 12/02/16> mild atherosclerosis of Ao, patent celiac/ SMA/ renals/ IMA, no focal liver abn, s/p GB w/ mild bile duct dil, fatty left inguinal hernia, mod sigmoid divertics...   LABS 12/02/16>  Chems- ok x BUN=21, Cr=1.32 (baseline=1.07);  Troponins= zero;  CBC- ok w Hg=15.0 (baseline =13.1), wbc=14.9 -- Note: they have copies of labs & wife concerned about WBC=14.9 & trending up + mult incidental findings on CT scans =. We discussed in detail & promised to f/u... IMP/PLAN>>  Ivery's wife concerned he doesn't drink enough water/ gatorade & sweats a lot while workng outside etc; similarly she had mult questons about the incidental findings on his CT scans-- I reassured them as best I could & indicated that we would f/u CT Chest w/ contrast in  October to check the nodule & LNs... NOTE:  >50% of this 3016mrov was spent in counseling and coordination of care...  ~  February 22, 2017:  2-20mo19mo & recall that RandGill in his bathroom 12/02/16 hitting his left chest on the commode; ER eval included a CT Angio Chest/Abd/Pelvis showing +prom bilat hilar & subcarinal LNs (16mm7mmm l33mlated peripheral nodule in LLL, irreg cyst in Lingula ~1cm, nondisplaced lateral 10th & 11th rib fxs;  Note that he also has RA & is followed by DrBeekGlean Salenbrel shots;  He is an ex-smoker w/ a 20-30 pack-yr smoking hx & quit in 2011;  He has no known prior history of lung disease... Bandy Demetricls that his left chest pain has resolved "all healed" and [facility] [place]; he reports that his breathing is good- denies SOB, no cough/ sput/ blood/ etc;  He continues to f/u w/ Rheum- William West, and Cards- William West (see below)... He is due for 20mo CT60most recheck as rec by radiology...    He continues to f/u w/ William West, Rheum> last seen 10/2016 by his hx but we do not have recent notes from him; pt reports doing saitis on Enbrel every other week dosing now...    He saw William West, Cards 01/10/17>  Hx HBP, PAF- relative intolerance to flecainide, chadsvasc is only 1; he is on CardizemCD120 & notes occas AFib episodes for which he takes and extra Cardizem tablet; Cards felt that his syncope 7/12 was likely orthostatic & they rec that he avoid dehydration & liberalize his salt intake    EXAM shows Afeb, VSS, O2sat=100% on RA;  HEENT- sl red, no exud, no adenopathy;  Chest- clear w/o w/r/r;  Heart- RR w/o m/r/g;  Abd- soft, nontender, neg;  Ext- neg w/o c/c/e;  Neuro- intact w/o focal abn..  CXR 02/24/17 (independently reviewed by me in the PACS system) shows norm heart size, clear lungs, NAD...  LABS 02/24/17>  FLP- all parameters wnl on diet alone;  Chems- wnl;  CBC- wnl;  TSH- 2.63;  PSA- 1.21;  VitD- 38  CT Chest 02/25/17>  Norm heart size, scattered  atherosclerotic calcif, no adenopathy- small scattered LNs are stable, 9mm LLL21m  periph nodule is unchanged, 74m cavitary lesion in LUL is "relatively" stable (the cavity itself may be sl larger), no new lesions- radiology has rec proceeding w/ PET scan...  IMP/PLAN>>  RMahinis clinically stable on his current regimen; f/u CT Chest shows similar findings in the LNs, 940mperiph nodule in LLL and the 109mUL cystic lesion; we will proceed w/ PET as discussed...   ~  ADDENDUM:  PET-CT done 03/08/17>  No signif hypermet assoc w/ the 1.0cm mildly thick walled cystic lingular lesion or the solid 0.9cm super-segm LLL pulm nodule- suggestive of benign etiology;  Nonspecific hypermetobolic mildly enlarged right mesenteric LN (new since 12/02/16 CT) & a reactive etiology is favored;  Mild hpermet at sites of the incompletely healed left 12-30-08-11th rib fxs;  Chronic findings include atherosclerotic calcif in Ao & coronaries, and marked sigmoid diverticulosis -- f/u CT Chest/ Abd/Pelvis w/ IV & oral constrast is rec in 249mo35mo ~  August 22, 2017:  249mo 2249mo- as above- RandyTaiyorns for a 249mo R60mo is due for a f/u CT Chest/ Abd/ Pelvis; he reports doing satis- feels well, no new complaints or concerns;  He notes that he had a URI in [id number]/ ZPak & resolved;  He denies cough, sput/ hemoptysis, SOB, or CP;  He has some allergies & pollen is a little rough;  He denies CP, palpit/ edema;  GI & GU are ok;  He uses Ativan Qhs for sleep...  We reviewed the following medical problems during today's office visit>      Ex-smoker> he quit in 2011 & has remained off cigs; breathing is good he says, no issues; exerc w/ yard work, splits wood, etc; acute bronchitis episode 12/2015- resolved w/ Levaquin; f/u CXR is clear...    Abn CT CHEST>  Scan 11/2016 after fall w/ trauma showed Norm heart size, NEG for PE, coronary & Ao atherosclerosis, +prom bilat hilar & subcarinal LNs (16mm),41m lob72mted pulm nodule in LLL, irreg cyst in  Lingula ~1cm, nondisplaced lateral 10th & 11th rib fxs; f/u scan & PET 02/2017 showed no signif hypermet assoc w/ the 1.0cm mildly thick walled cystic lingular lesion or the solid 0.9cm super-segm LLL pulm nodule- suggestive of benign etiology;  Nonspecific hypermetobolic mildly enlarged right mesenteric LN (new since 12/02/16 CT) & a reactive etiology is favored;  Mild hpermet at sites of the incompletely healed left 12-30-08-11th rib fxs;  Chronic findings include atherosclerotic calcif in Ao & coronaries, and marked sigmoid diverticulosis; repeat CT Chest/ Abd/ Pelvis due now 08/2017=> see below...    PAF> on ASA81 & DiltiazemCD120 (off Flecainide) & followed by William West w/ extra Diltiazem prn (he has used this extra med several times over the last yr); he says that William West told him not to take ASA (?due to bleeding)...    Chol> on diet alone & wt=215#; FLP 10/18 is stable w/ TChol 137, TG 90, HDL 45, LDL 74; we reviewed diet & wt reduction strategies...    Dyspepsia> on Omep 40mg/d &25ms helps to control his symptoms; he saw [facility] 11/12 at [facility] for RUQ pain after lifting- CT was neg & the pain was felt to be musculoskeletal pain; s/p lap chole 7/11 for stones; GI symptoms 2/16 resolved off MTX... ...    [facility]> [facility] & [facility]/u 01/2015 showed divertics, hems & otherw neg;; She saw Pt 04/2017 for GERD, divertics, and note reviewed...    Urology>  he saw DrMcKenzie 08/2015 for blood in semen- cleared spont, no recurrence, UA was clear; hematospermia felt to be benign & f/u prn...    RA> followed by Glean West every 3-67moon Enbrel & off MTX now- doing very well on monotherapy; last note 11/19/15 reviewed- RA of mult sites, on Enbrel, pos Rheum factor, doing satis,     Hx dysplastic nevus, fair skinned/ red hair> advised to get Derm eval at least yearly & he promises to do so... EXAM shows Afeb, VSS, O2sat=98% on RA;  HEENT- sl red, no exud, no adenopathy;  Chest- clear  w/o w/r/r;  Heart- RR w/o m/r/g;  Abd- soft, nontender, neg;  Ext- neg w/o c/c/e;  Neuro- intact w/o focal abn..William West LABS 08/22/17>  Chems- ok w/ K=4.3, BS=107, Cr=1.14 IMP/PLAN>>  RAveeris clinically stable, doing well- he is due for his f/u CT scans- as below, nothing acute or progressive & we continue to follow;  Given Prevnar-13 today & rec to continue same meds w/ ROV 635mo.  ~  ADDENDUM:  CT Chest, Abd, Pelvis done 09/06/17>  Norm heart size, no adenopathy, unchanged 1077mavitary nodule in LUL, and 7mm87mdule in LLL, no new nodules;  In abd- small amt of pneumobilia in left hep lobe, s/p GB & presumed sphincterotomy, diverticulosis w/o inflamm changes, mod left inguinal hernia containing fat; otherw all WNL- pancreas normal, no adenopathy visualized...   ~  March 02, 2018:  18mo 22mo& general medical follow up visit>               Problem List:     Ex-CIGARETTE SMOKER (ICD-305.1) - started as teen, up to 1/2 ppd, quit for up to 76yrs,37yrently 1/2 ppd but he says he quit 6/11 & has remained quit!  No hx resp tract diseases...  ~  baseline CXRs showed clear, NAD... ~  William KitchenXR 7/11 showed essent clear lungs, +gallstone seen in right abd... ~  CXR 8/13 showed normal heart size, clear lungs, low lung vols & sl elev of right hemidiaph, s/p GB... ~  CXR 8/15 showed norm heart size, clear lungs, NAD. ~  CXR 8/16 showed norm heart size, clear lungs, NAD  PAF >> managed by William West for Cards- first diagnosed 11/13 when he presented to ER w/ palpit; placed on CCB and converted to NSR... ~  He remains stable on Diltiazem 120 Qam and Flecainide "pill in the pocket" strategy (100mg p62malpit)... ~  EKG 11/15 showed NSR, rate75, NAD...   HYPERCHOLESTEROLEMIA, BORDERLINE (ICD-272.4) - currently on diet Rx alone. ~  FLP 1/09 showed TChol 198, TG 127, HDL 34, LDL 138 ~  FLP 6/10 showed TChol 170, TG 96, HDL 40, LDL 111 ~  FLP 6/11 showed TChol 184, TG 104, HDL 43, LDL 120... rec low chol/ low fat  diet. ~  FLP 8/12 (wt=237#) showed TChol 183, TG 97, HDL 43, LDL 121... Not really on diet "I just eat" ~  FLP 8/13 (wt=235#) showed TChol 176, TG 78, HDL 41, LDL 120 ~  FLP 8/14 on diet alone (wt=234#) showed TChol 166, TG 78, HDL 41, LDL 109 ~  FLP 8/15 on diet alone (wt=230#) showed TChol 161, TG 117, HDL 37, LDL 100 ~  FLP 8/16 on diet alone (wt=226#) showed TChol 152, TG 88, HDL 41, LDL 93  DYSPEPSIA (ICD-536.8) - takes PRILOSEC OTC daily... no dysphagia, N/V, abd pain, etc... no prev endo etc... states he really needs it daily & has pain/ difficulty if he  stops the Rx. ~  S/p lap chole 7/11 by William West for cholecystitis & stones... ~  2012: he had recurrent abd pain & saw William West who did a CT Abd&Pelvis- NEG, NAD (prev cholecystectomy, sm hepatic dome cyst, diverticulosis, NAD); pain felt to be musculoskeletal...  DIVERTICULOSIS OF COLON (ICD-562.10)  ~  He had colonoscopy 1/06 from DrMann= divertics, tiny polyp (path=norm mucosa), hems... ~  F/u colon 11/11 by DrNat-Mann w/ divertics, hems, sm rectal polyps= fragments of benign mucosa only...  ABDOMINAL PAIN & Hx ELEVATED HEPATOCELLULAR ENZYMES >> resolved off MTX ~  2/16: he presented w/ several month hx abd discomfort and seems progressive- now getting "attacks" 3 times per week; states the discomfort is similar to prev GB attacks (he had GB removed by William West in 2011); c/o lots of indigestion- notes some nausea & vomits on occas then feels better; says appetite is fair, eats ok w/o swallowing issues, then 30 mon later "it sits on my stomach"; he has noted white stool yest & states his urine has been darker; he has been on OTC Omeprazole20 w/o help he says... NOTE> he had recheck by William West for abd pain 62yrafter GB surg- they did CT Abd and it was NEG (prev cholecystectomy, sm hepatic dome cyst, diverticulosis, NAD)..William KitchenMarland KitchenWe placed him on Nexium40Bid, and referred him to GI for further eval & rx... ~  LABS 2/16 revealed> Chems-  wnl x GOT=120 GPT=248 (these were prev wnl); AlkPhos=103, Amylase=50 (27-131) & Lipase=36 (11-59); CBC- wnl;  Sed=5...  ~  Abd Ultrasound 2/16 showed absent GB, ducts ok, poor vis of liver & pancreas, elarged spleen, kidneys ok, no other signif abn noted.. ~  All symptoms resolved off MTX & LFTs ret to normal...  Hx of MONONUCLEOSIS (ICD-075) - in high school... had mono hepatitis... no known sequellae... he also had RMSF at age 67 pretty sick, recovered w/o sequellae. ~  labs have consistently shown normal LFTs x during his cholecystitis presentation 7/11...  RHEUMATOID ARTHRITIS >> Diagnosed w/ sero-pos RA 10/11 & referred to Rheum, William West- on MTX currently 6/wk & occas Pred taper... ~  8/13:  [place] 7 he is already feeling better (we do not have notes from Rheum). ~  8/14:  followed by DGlean Salenon Enbrel & MTX (currently 4/wk)- this has really helped & doing very well on Rx... ~  He continues to f/u w/ Rheum every 3-41moor exam & labs; [facility]... ~  Now on Enbrel alone, off MTX due to elev LFTs...  Hx of DYSPLASTIC NEVUS (ICD-216.9) - removed from base of neck/ upper back by DrHensel/ DrCrowe in 1993... ~  8/13:  He is reminded to check w/ Derm at least yearly due to his fair complexion, red hair, sun exposure, etc... ~  8/14:  He has not yet followed up w/ derm & promises to do so soon...  HEALTH MAINTENANCE:  on ASA 8156maily... ~  GI:  colonoscopy 1/06 & 11/11 by DrMann as above... ~  GU: neg DRE & PSA here remains wnl... ~  Immunizations:  age 59 58w> exsmoker- given PNEUMOVAX 6/10;  given TETANUS shot 6/10; discussed yearly FLU vaccines.   Past Surgical History:  Procedure Laterality Date  . CHOLECYSTECTOMY  2011   laparoscopic  . COLONOSCOPY  01/24/2015   x 3 in past  . dysplastic nevus  07/1991   excised from upper back  . ERCP  N/A 02/18/2015   Procedure: ENDOSCOPIC  RETROGRADE CHOLANGIOPANCREATOGRAPHY (ERCP);  Surgeon: Carol Ada, MD;  Location: [facility];  Service: Endoscopy;  Laterality: N/A;  . GIVENS CAPSULE STUDY  01/24/2015  . GIVENS CAPSULE STUDY N/A 01/24/2015   Procedure: GIVENS CAPSULE STUDY;  Surgeon: Juanita Craver, MD;  Location: [place] [facility];  Service: Endoscopy;  Laterality: N/A;  . L foot injury  age 25   w/ part amputation great toe  . TONSILLECTOMY AND ADENOIDECTOMY  as a child    Outpatient Encounter Medications as of 03/02/2018  Medication Sig  . Acetaminophen (TYLENOL EXTRA STRENGTH PO) Take by mouth. 2 tabs as needed  . aspirin 81 MG tablet Take 81 mg by mouth once a week. 2-3 times a  week  . Bismuth Subsalicylate (DIARRHEA PO) Take by mouth. [facility] Diarrhea Control as needed  . diltiazem (CARDIZEM CD) 120 MG 24 hr capsule TAKE 1 CAPSULE (120 MG TOTAL) BY MOUTH DAILY.  William West etanercept (ENBREL) 50 MG/ML injection Inject 50 mg into the skin once a week. On mondays  . ibuprofen (ADVIL,MOTRIN) 200 MG tablet Take 200 mg by mouth every 8 (eight) hours as needed.  William West LORazepam (ATIVAN) 1 MG tablet TAKE 1/2 TO 1 TABLET BY MOUTH 3 TIMES A DAY AS NEEDED  . omeprazole (PRILOSEC) 40 MG capsule Take 40 mg by mouth daily.  . [DISCONTINUED] doxycycline (VIBRA-TABS) 100 MG tablet Take 1 tablet (100 mg total) by mouth 2 (two) times daily.  . [DISCONTINUED] LORazepam (ATIVAN) 1 MG tablet TAKE 1/2 TO 1 TABLET BY MOUTH 3 TIMES A DAY AS DIRECTED  . [DISCONTINUED] oseltamivir (TAMIFLU) 75 MG capsule Take 1 capsule (75 mg total) by mouth 2 (two) times daily.   No facility-administered encounter medications on file as of 03/02/2018.     Allergies  Allergen Reactions  . Caffeine Other (See Comments)    Causes him to go into afib  . Flecainide Other (See Comments)    Caused heart to race too fast and had him in the ED  . Methotrexate Derivatives Nausea And Vomiting    Increased liver enzymes  . Pseudoephedrine-Ibuprofen Other (See Comments)    "  afib"   . Penicillins Rash    .William KitchenHas patient had a PCN reaction causing immediate rash, facial/tongue/throat swelling, SOB or lightheadedness with hypotension: No, YES TO RASH  Has patient had a PCN reaction causing severe rash involving mucus membranes or skin necrosis: No Has patient had a PCN reaction that required hospitalization No Has patient had a PCN reaction occurring within the last 10 years: No If all of the above answers are "NO", then may proceed with Cephalosporin use.    Immunization History  Administered Date(s) Administered  . Influenza Split 02/22/2011, 03/14/2012, 03/16/2013, 02/08/2014, 02/22/2015  . Influenza, High Dose Seasonal PF 02/21/2017, 03/02/2018  . Influenza,inj,Quad PF,6+ Mos 01/21/2016  . Pneumococcal Conjugate-13 08/22/2017  . Pneumococcal Polysaccharide-23 10/24/2008  . Td 09/21/2001, 10/24/2008  . Tdap 03/07/2014    Current Medications, Allergies, Past Medical History, Past Surgical History, Family History, and Social History were reviewed in [employer] record.   Review of Systems        See HPI - all other systems neg except as noted... Presents 2/16 c/o abd discomfort, intermit n/v, white stoools and dark urine...       The patient denies anorexia, fever, weight loss, weight gain, vision loss, decreased hearing, hoarseness, chest pain, syncope, dyspnea on exertion, peripheral edema, prolonged cough, headaches, hemoptysis, melena, hematochezia,  hematuria, incontinence, muscle weakness, suspicious skin lesions, transient blindness, difficulty walking, depression, unusual weight change, abnormal bleeding, enlarged lymph nodes, and angioedema.   Prev c/o omplains of joint pain, joint swelling, loss of strength, and stiffness (all improved on MTX); denies joint redness, low back pain, mid back pain, muscle aches, cramps, and thoracic pain.   Objective:   Physical Exam     WD, Overweight, 67 y/o WM in NAD... GENERAL:  Alert &  oriented; pleasant & cooperative... HEENT:  [place]/AT, EOM-wnl, PERRLA, Fundi-benign, EACs-clear, TMs-wnl, NOSE-clear, THROAT-clear & wnl. NECK:  Supple w/ full ROM; no JVD; normal carotid impulses w/o bruits; no thyromegaly or nodules palpated; no lymphadenopathy. CHEST:  Clear to P & A; without wheezes/ rales/ or rhonchi. HEART:  Regular Rhythm; without murmurs/ rubs/ or gallops. ABDOMEN:  Soft & non-tender; s/p lap chole, normal bowel sounds; no organomegaly or masses detected. RECTAL:  Neg - prostate 2+ & nontender w/o nodules; stool hematest neg EXT: deformity L great toe, sl swelling & tender MCPs, some IPs, & wrist; no varicose veins/ venous insuffic/ or edema. NEURO:  CN's intact; motor testing normal; sensory testing normal; gait normal & balance OK. DERM:  scar in upper back at base of neck toward R side, no new lesions noted...  RADIOLOGY DATA:  Reviewed in the EPIC EMR & discussed w/ the patient...  LABORATORY DATA:  Reviewed in the EPIC EMR & discussed w/ the patient...   Assessment & Plan:    Episode of near-syncope w/ fall & 2 left ant rib fxs 11/2016, assoc w/ orthostasis and dehydration=> resolved w/ conservative management...  12/24/15> URI/ Bronchitis-- Rx w/ Depo, Levaquin, Mucinex, align... 03/2016> similar symptoms resolved w/ ZPak... 11/2016>  LLL pulm nodule and mild hilar/subcarinal adenopathy on CT Chest done for fall w/ ant left CWP => f/u scan planned for Oct2018... 02/24/17>   Banks is clinically stable on his current regimen; f/u CT Chest shows similar findings in the LNs, 33m periph nodule in LLL and the 157mLUL cystic lesion; we will proceed w/ PET as discussed. 08/22/17>   RaEfes clinically stable, doing well- he is due for his f/u CT scans- as below, nothing acute or progressive & we continue to follow;  Given Prevnar-13 today & rec to continue same meds w/ ROV 28m82moPAF>  New prob 11/13- followed by William West on Cardizem & Flecainide prn...  CHOL>  As noted w/  LDL down to 93 - needs better diet, wt reduction or consider low dose statin rx, he will decide...  Dyspepsia>  On Nexium daily...  Divertics/ sm polyps>  follwed by DrNat-Mann & up to date on colon screening etc...  Hx of abd discomfort assoc w/ "attacks" of pain, indigestion, & intermit n/v; Labs w/ elev GOT/GPT; Sonar w/ splenomegaly but poor vis of liver/pancreas; placed on [id number] & referred to GI for further eval &rx => all symptoms resolved off MTX and LFTs ret ro normal...  Urinary symptoms 10/15>> hx is somewhat vague & seems minor; min tender over bladder otherw norm exam; Labs & UA are clear/wnl; Rec Fluids, Cranberry juice, tylenol prn, & observe; symptoms resolved...  Sero-positive RA>  Followed by DrBGlean West ENBREL & now off MTX...  Other medical problems as noted...  CPX>  He quit smoking 6/11;  Baseline EKG w/ NSR, NSSTTWA, NAD;  Baseline CXR = NAD; Labs look good as well...    Patient's Medications  New Prescriptions   No medications on file  Previous Medications  ACETAMINOPHEN (TYLENOL EXTRA STRENGTH PO)    Take by mouth. 2 tabs as needed   ASPIRIN 81 MG TABLET    Take 81 mg by mouth once a week. 2-3 times a  week   BISMUTH SUBSALICYLATE (DIARRHEA PO)    Take by mouth. [facility] Diarrhea Control as needed   DILTIAZEM (CARDIZEM CD) 120 MG 24 HR CAPSULE    TAKE 1 CAPSULE (120 MG TOTAL) BY MOUTH DAILY.   ETANERCEPT (ENBREL) 50 MG/ML INJECTION    Inject 50 mg into the skin once a week. On mondays   IBUPROFEN (ADVIL,MOTRIN) 200 MG TABLET    Take 200 mg by mouth every 8 (eight) hours as needed.   OMEPRAZOLE (PRILOSEC) 40 MG CAPSULE    Take 40 mg by mouth daily.  Modified Medications   Modified Medication Previous Medication   LORAZEPAM (ATIVAN) 1 MG TABLET LORazepam (ATIVAN) 1 MG tablet      TAKE 1/2 TO 1 TABLET BY MOUTH 3 TIMES A DAY AS NEEDED    TAKE 1/2 TO 1 TABLET BY MOUTH 3 TIMES A DAY AS DIRECTED  Discontinued Medications   DOXYCYCLINE (VIBRA-TABS) 100 MG  TABLET    Take 1 tablet (100 mg total) by mouth 2 (two) times daily.   OSELTAMIVIR (TAMIFLU) 75 MG CAPSULE    Take 1 capsule (75 mg total) by mouth 2 (two) times daily.

## 2018-03-08 DIAGNOSIS — D225 Melanocytic nevi of trunk: Secondary | ICD-10-CM | POA: Diagnosis not present

## 2018-03-08 DIAGNOSIS — L570 Actinic keratosis: Secondary | ICD-10-CM | POA: Diagnosis not present

## 2018-03-08 DIAGNOSIS — L814 Other melanin hyperpigmentation: Secondary | ICD-10-CM | POA: Diagnosis not present

## 2018-03-08 DIAGNOSIS — D1801 Hemangioma of skin and subcutaneous tissue: Secondary | ICD-10-CM | POA: Diagnosis not present

## 2018-03-30 ENCOUNTER — Encounter (HOSPITAL_COMMUNITY): Payer: Self-pay | Admitting: Emergency Medicine

## 2018-03-30 ENCOUNTER — Emergency Department (HOSPITAL_COMMUNITY)
Admission: EM | Admit: 2018-03-30 | Discharge: 2018-03-30 | Disposition: A | Discharge status: Home / Self Care | Payer: Medicare Other | Attending: Emergency Medicine | Admitting: Emergency Medicine

## 2018-03-30 ENCOUNTER — Emergency Department (HOSPITAL_COMMUNITY): Payer: Medicare Other

## 2018-03-30 ENCOUNTER — Telehealth: Payer: Self-pay | Admitting: Pulmonary Disease

## 2018-03-30 ENCOUNTER — Other Ambulatory Visit: Payer: Self-pay

## 2018-03-30 DIAGNOSIS — S61432A Puncture wound without foreign body of left hand, initial encounter: Secondary | ICD-10-CM | POA: Insufficient documentation

## 2018-03-30 DIAGNOSIS — Z7982 Long term (current) use of aspirin: Secondary | ICD-10-CM | POA: Diagnosis not present

## 2018-03-30 DIAGNOSIS — Z87891 Personal history of nicotine dependence: Secondary | ICD-10-CM | POA: Insufficient documentation

## 2018-03-30 DIAGNOSIS — Y999 Unspecified external cause status: Secondary | ICD-10-CM | POA: Diagnosis not present

## 2018-03-30 DIAGNOSIS — Z23 Encounter for immunization: Secondary | ICD-10-CM | POA: Insufficient documentation

## 2018-03-30 DIAGNOSIS — Y929 Unspecified place or not applicable: Secondary | ICD-10-CM | POA: Diagnosis not present

## 2018-03-30 DIAGNOSIS — W268XXA Contact with other sharp object(s), not elsewhere classified, initial encounter: Secondary | ICD-10-CM | POA: Insufficient documentation

## 2018-03-30 DIAGNOSIS — Y9389 Activity, other specified: Secondary | ICD-10-CM | POA: Insufficient documentation

## 2018-03-30 DIAGNOSIS — Z79899 Other long term (current) drug therapy: Secondary | ICD-10-CM | POA: Insufficient documentation

## 2018-03-30 MED ORDER — TETANUS-DIPHTH-ACELL PERTUSSIS 5-2.5-18.5 LF-MCG/0.5 IM SUSP
0.5000 mL | Freq: Once | INTRAMUSCULAR | Status: AC
  Administered 2018-03-30: 0.5 mL via INTRAMUSCULAR
  Filled 2018-03-30: qty 0.5

## 2018-03-30 MED ORDER — SULFAMETHOXAZOLE-TRIMETHOPRIM 800-160 MG PO TABS: 1.0000 | ORAL_TABLET | Freq: Two times a day (BID) | ORAL | 0 refills | Status: AC

## 2018-03-30 NOTE — Telephone Encounter (Signed)
Spoke with pt, and advised he had a TDAP on 03/07/2014. He states he was on the way to [facility] now to have it looked at. I agreed and advised the doctor would decide if he needed another TDAP. Nothing further is needed.

## 2018-03-30 NOTE — ED Triage Notes (Signed)
Patient has puncture wound to his L hand from a barb wire fence. Last tetanus was in 2015. Wound over the joint, patient on Embrel for RA.

## 2018-03-30 NOTE — ED Provider Notes (Signed)
[facility] EMERGENCY DEPARTMENT Provider Note   CSN: [id number] Arrival date & time: 03/30/18  1624     History   Chief Complaint Chief Complaint  Patient presents with  . Puncture Wound    HPI William West is a 67 y.o. male.  The history is provided by the patient. No language interpreter was used.  Hand Pain  This is a new problem. The current episode started 6 to 12 hours ago. The problem occurs constantly. The problem has been gradually worsening. Nothing aggravates the symptoms. Nothing relieves the symptoms. He has tried nothing for the symptoms. The treatment provided no relief.  Pt reports he was pulling barbed wire out of shed and it stuck him in the hand.  (not rusted)  Pt unsure if he could have a foreign body.  Pt states his MD told him to get a tetanus shot.    Past Medical History:  Diagnosis Date  . A-fib ([place])   . Acute arthritis   . Allergic rhinitis   . Atrial fibrillation ([place])    history of a fib  . Dermatitis   . Diverticulosis of colon   . Dysplastic nevus   . GERD (gastroesophageal reflux disease)   . GI bleeding 01/24/2015  . Hypercholesterolemia   . Mononucleosis   . RA (rheumatoid arthritis) ([place])   . Tobacco use disorder     Patient Active Problem List   Diagnosis Date Noted  . Abnormal CT of the chest 03/03/2018  . Postural dizziness with near syncope 12/09/2016  . Fracture of rib 12/09/2016  . Pulmonary nodule, left 12/09/2016  . Acute upper respiratory infection 04/01/2016  . Acute bronchitis 12/24/2015  . Acute blood loss anemia 01/26/2015  . GI bleeding 01/24/2015  . Ex-cigarette smoker 01/16/2015  . Abdominal pain, epigastric 07/02/2014  . Nausea and vomiting 07/02/2014  . Atrial fibrillation ([place]) 04/25/2012  . Annual physical exam 01/06/2011  . Rheumatoid arthritis ([place]) 01/06/2011  . ALLERGIC RHINITIS 04/22/2009  . Hypercholesteremia 10/24/2008  . DERMATITIS 10/11/2007  . MONONUCLEOSIS 06/21/2007  . Benign neoplasm  of skin 06/21/2007  . Esophageal reflux 06/21/2007  . Diverticulosis of large intestine 06/21/2007    Past Surgical History:  Procedure Laterality Date  . CHOLECYSTECTOMY  2011   laparoscopic  . COLONOSCOPY  01/24/2015   x 3 in past  . dysplastic nevus  07/1991   excised from upper back  . ERCP N/A 02/18/2015   Procedure: ENDOSCOPIC RETROGRADE CHOLANGIOPANCREATOGRAPHY (ERCP);  Surgeon: Carol Ada, MD;  Location[facility];  Service: Endoscopy;  Laterality: N/A;  . GIVENS CAPSULE STUDY  01/24/2015  . GIVENS CAPSULE STUDY N/A 01/24/2015   Procedure: GIVENS CAPSULE STUDY;  Surgeon: Juanita Craver, MD;  Location: [place];  Service: Endoscopy;  Laterality: N/A;  . L foot injury  age 69   w/ part amputation great toe  . TONSILLECTOMY AND ADENOIDECTOMY  as a child        Home Medications    Prior to Admission medications   Medication Sig Start Date End Date Taking? Authorizing Provider  Acetaminophen (TYLENOL EXTRA STRENGTH PO) Take by mouth. 2 tabs as needed    [provider]  aspirin 81 MG tablet Take 81 mg by mouth once a week. 2-3 times a  week    [provider]  Bismuth Subsalicylate (DIARRHEA PO) Take by mouth. [facility] Diarrhea Control as needed    [provider]  diltiazem (CARDIZEM CD) 120 MG 24 hr capsule TAKE 1  CAPSULE (120 MG TOTAL) BY MOUTH DAILY. 08/12/17   Evans Lance, MD  etanercept (ENBREL) 50 MG/ML injection Inject 50 mg into the skin once a week. On mondays    [provider]  ibuprofen (ADVIL,MOTRIN) 200 MG tablet Take 200 mg by mouth every 8 (eight) hours as needed.    [provider]  LORazepam (ATIVAN) 1 MG tablet TAKE 1/2 TO 1 TABLET BY MOUTH 3 TIMES A DAY AS NEEDED 03/02/18   Noralee Space, MD  omeprazole (PRILOSEC) 40 MG capsule Take 40 mg by mouth daily.    [provider]  sulfamethoxazole-trimethoprim (BACTRIM DS,SEPTRA DS) 800-160 MG tablet Take 1 tablet by mouth 2 (two) times daily for 7  days. 03/30/18 04/06/18  Fransico Meadow, PA-C    Family History Family History  Problem Relation Age of Onset  . Prostate cancer Father   . Other Father        pacemaker  . Heart disease Mother   . Other Mother        vascular disease    Social History Social History   Tobacco Use  . Smoking status: Former Smoker    Packs/day: 0.50    Years: 45.00    Pack years: 22.50    Types: Cigarettes    Last attempt to quit: 11/13/2009    Years since quitting: 8.3  . Smokeless tobacco: Never Used  Substance Use Topics  . Alcohol use: No    Alcohol/week: 0.0 standard drinks  . Drug use: No     Allergies   Caffeine; Flecainide; Methotrexate derivatives; Pseudoephedrine-ibuprofen; and Penicillins   Review of Systems Review of Systems  Skin: Positive for wound.  All other systems reviewed and are negative.    Physical Exam Updated Vital Signs BP 128/80 (BP Location: Left Arm)   Pulse 67   Temp 98.6 F (37 C) (Oral)   Resp 12   Ht 6' (1.829 m)   Wt 91.6 kg   SpO2 97%   BMI 27.40 kg/m   Physical Exam  Constitutional: He appears well-developed and well-nourished.  Musculoskeletal:  Pinpoint puncture wound left hand,  Redness and swelling around area  nv and ns intact   Neurological: He is alert.  Skin: Skin is warm.  Psychiatric: He has a normal mood and affect.  Nursing note and vitals reviewed.    ED Treatments / Results  Labs (all labs ordered are listed, but only abnormal results are displayed) Labs Reviewed - No data to display  EKG None  Radiology Dg Hand Complete Left  Result Date: 03/30/2018 CLINICAL DATA:  Pt punctured his left hand on a barb-wired fence today. Puncture wound, swelling, redness over the 5th MCP joint. EXAM: LEFT HAND - COMPLETE 3+ VIEW COMPARISON:  None. FINDINGS: No fracture.  No bone lesion. There is no bone resorption to suggest osteomyelitis. Joints are normally spaced and aligned. Soft tissues are unremarkable. No soft tissue  air. No radiopaque foreign body. IMPRESSION: Negative. Electronically Signed   By: Lajean Manes M.D.   On: 03/30/2018 17:16    Procedures Procedures (including critical care time)  Medications Ordered in ED Medications  Tdap (BOOSTRIX) injection 0.5 mL (0.5 mLs Intramuscular Given 03/30/18 1746)     Initial Impression / Assessment and Plan / ED Course  I have reviewed the triage vital signs and the nursing notes.  Pertinent labs & imaging results that were available during my care of the patient were reviewed by me and considered in my  medical decision making (see chart for details).     Pt thinks his tetanus is up to date but wants one to be sure.  Pt given tetanus.  Pt started on Bactrim.  Pt advised to watch carefully for infection   Final Clinical Impressions(s) / ED Diagnoses   Final diagnoses:  Puncture wound of left hand, foreign body presence unspecified, initial encounter    ED Discharge Orders         Ordered    sulfamethoxazole-trimethoprim (BACTRIM DS,SEPTRA DS) 800-160 MG tablet  2 times daily     03/30/18 1744        An After Visit Summary was printed and given to the patient.    Sidney Ace 03/30/18 2214    Isla Pence, MD 03/30/18 2224

## 2018-04-10 ENCOUNTER — Telehealth: Payer: Self-pay | Admitting: Pulmonary Disease

## 2018-04-10 NOTE — Telephone Encounter (Signed)
Spoke with patient. He stated that he received a bill from [facility] in regards to the bloodwork that was done during his physical on 03/02/18. He received a bill for $6 for blood draw as well as a bill for $55 for his PSA test. He stated that Medicare Part B normally pays 100% for his labs. He was advised by his insurance that the reason why he was billed was due to the fact that incorrect codes were placed. He wants to know how can this be fixed.   Advised patient that I would send a message to our billing specialist. He verbalized understanding.   Kathlee Nations, please advise if you can take a look at this for [place]. Thanks!

## 2018-04-19 NOTE — Telephone Encounter (Signed)
Kathlee Nations please advise once available, thank you.

## 2018-04-26 NOTE — Telephone Encounter (Signed)
The diagnoses have been corrected and the claim has been re-filed, he should not be receiving any more bills.

## 2018-04-27 DIAGNOSIS — K219 Gastro-esophageal reflux disease without esophagitis: Secondary | ICD-10-CM | POA: Diagnosis not present

## 2018-04-27 DIAGNOSIS — K5730 Diverticulosis of large intestine without perforation or abscess without bleeding: Secondary | ICD-10-CM | POA: Diagnosis not present

## 2018-04-27 DIAGNOSIS — K640 First degree hemorrhoids: Secondary | ICD-10-CM | POA: Diagnosis not present

## 2018-06-21 ENCOUNTER — Other Ambulatory Visit: Payer: Self-pay

## 2018-06-21 MED ORDER — DILTIAZEM HCL ER COATED BEADS 120 MG PO CP24: ORAL_CAPSULE | ORAL | 3 refills | Status: DC

## 2018-06-25 NOTE — Progress Notes (Signed)
William West is a 68 y.o. male is here to [place].   Patient Care Team: Briscoe Deutscher, DO as PCP - General (Family Medicine) Hennie Duos, MD as Consulting Physician (Rheumatology) Juanita Craver, MD as Consulting Physician (Gastroenterology)   History of Present Illness:   HPI: Very nice new patient, previous with Dr. Lenna Gilford, until he retired. Wants a long-term relationship with doctor. Grew up in [place], married his high school love (when he was 34 and she was 24). Married for 47 years. With two sons and four grandchildren. 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 He was happy to say that he was able to help his boys get through college without debt.  See Assessment and Plan section for Problem Based Charting of issues discussed today.   Social History   Social History Narrative   One son with HBP. One son with IDDM. Exercises regularly at the [facility]. Eats well. Down 30 pounds with the lifestyle change. Exercise keeps him from feeling so stiff. Weighs daily. Baseline 199-204.    Health Maintenance Due  Topic Date Due  . Hepatitis C Screening  [birth year]   Depression screen PHQ 2/9 06/26/2018  Decreased Interest 0  Down, Depressed, Hopeless 0  PHQ - 2 Score 0    PMHx, SurgHx, SocialHx, Medications, and Allergies were reviewed in the Visit Navigator and updated as appropriate.   Past Medical History:  Diagnosis Date  . A-fib ([place])   . Acute blood loss anemia 01/26/2015  . Allergic rhinitis   . Diverticulosis of colon   . Dysplastic nevus   . GERD (gastroesophageal reflux disease)   . GI bleeding 01/24/2015  . Hypercholesterolemia   . Postural dizziness with near syncope due to dehydration 12/09/2016  . RA (rheumatoid arthritis) ([place])      Past Surgical History:  Procedure Laterality Date  . COLONOSCOPY  01/24/2015   x 3  . ERCP N/A 02/18/2015   Procedure: ENDOSCOPIC RETROGRADE  CHOLANGIOPANCREATOGRAPHY (ERCP);  Surgeon: Carol Ada, MD;  Location: [facility];  Service: Endoscopy;  Laterality: N/A;  . GIVENS CAPSULE STUDY N/A 01/24/2015   Procedure: GIVENS CAPSULE STUDY;  Surgeon: Juanita Craver, MD;  Location: [facility];  Service: Endoscopy;  Laterality: N/A;  . LAPAROSCOPIC CHOLECYSTECTOMY  2011  . TOE AMPUTATION  1966  . TONSILLECTOMY AND ADENOIDECTOMY       Family History  Problem Relation Age of Onset  . Prostate cancer Father   . Heart disease Mother   . Rheum arthritis Mother     Social History   Tobacco Use  . Smoking status: Former Smoker    Packs/day: 0.50    Years: 45.00    Pack years: 22.50    Types: Cigarettes    Last attempt to quit: 11/13/2009    Years since quitting: 8.6  . Smokeless tobacco: Never Used  Substance Use Topics  . Alcohol use: No    Alcohol/week: 0.0 standard drinks  . Drug use: No    Current Medications and Allergies   .  Multiple Vitamin (MULTIVITAMIN) tablet, Take 1 tablet by mouth daily., Disp: , Rfl:  .  aspirin 81 MG tablet, Take 81 mg by mouth as needed. , Disp: , Rfl:  .  diltiazem (CARDIZEM CD) 120 MG 24 hr capsule, TAKE 1 CAPSULE (120 MG TOTAL) BY MOUTH DAILY., Disp: 90 capsule, Rfl: 3 .  etanercept (ENBREL) 50 MG/ML injection, Inject 50 mg into  the skin once a week. On mondays, Disp: , Rfl:  .  LORazepam (ATIVAN) 1 MG tablet, Take 1 tablet (1 mg total) by mouth at bedtime. TAKE 1/2 TO 1 TABLET BY MOUTH 3 TIMES A DAY AS NEEDED, Disp: 90 tablet, Rfl: 1 .  omeprazole (PRILOSEC) 40 MG capsule, Take 40 mg by mouth daily., Disp: , Rfl:  .  Probiotic Product (PROBIOTIC DAILY PO), Take 1 tablet by mouth daily. Renewlife Ultimate Flora, Disp: , Rfl:  .  vitamin C (ASCORBIC ACID) 500 MG tablet, Take 500 mg by mouth daily. , Disp: , Rfl:    Allergies  Allergen Reactions  . Caffeine Other (See Comments)    Causes him to go into afib  . Flecainide Other (See Comments)    Caused heart to race too fast and had him  in the ED  . Methotrexate Derivatives Nausea And Vomiting    Increased liver enzymes  . Pseudoephedrine-Ibuprofen Other (See Comments)    " afib"   . Penicillins Rash    .Marland KitchenHas patient had a PCN reaction causing immediate rash, facial/tongue/throat swelling, SOB or lightheadedness with hypotension: No, YES TO RASH  Has patient had a PCN reaction causing severe rash involving mucus membranes or skin necrosis: No Has patient had a PCN reaction that required hospitalization No Has patient had a PCN reaction occurring within the last 10 years: No If all of the above answers are "NO", then may proceed with Cephalosporin use.   Review of Systems   Pertinent items are noted in the HPI. Otherwise, a complete ROS is negative.  Vitals   Vitals:   06/26/18 0856  BP: 130/86  Pulse: 62  Temp: 97.8 F (36.6 C)  TempSrc: Oral  SpO2: 97%  Weight: 206 lb (93.4 kg)  Height: _0  (1.803 m)     Body mass index is 28.73 kg/m.  Physical Exam   Physical Exam Vitals signs and nursing note reviewed.  Constitutional:      General: He is not in acute distress.    Appearance: He is well-developed.  HENT:     Head: Normocephalic and atraumatic.     Right Ear: External ear normal.     Left Ear: External ear normal.     Nose: Nose normal.  Eyes:     Conjunctiva/sclera: Conjunctivae normal.     Pupils: Pupils are equal, round, and reactive to light.  Neck:     Musculoskeletal: Neck supple.  Cardiovascular:     Rate and Rhythm: Normal rate and regular rhythm.  Pulmonary:     Effort: Pulmonary effort is normal.  Abdominal:     General: Bowel sounds are normal.     Palpations: Abdomen is soft.  Musculoskeletal: Normal range of motion.  Skin:    General: Skin is warm.  Neurological:     Mental Status: He is alert.  Psychiatric:        Behavior: Behavior normal.     Results for orders placed or performed in visit on 03/02/18  PSA  Result Value Ref Range   PSA 1.45 0.10 - 4.00 ng/mL    CBC w/Diff  Result Value Ref Range   WBC 6.0 4.0 - 10.5 K/uL   RBC 5.06 4.22 - 5.81 Mil/uL   Hemoglobin 15.2 13.0 - 17.0 g/dL   HCT 43.7 39.0 - 52.0 %   MCV 86.4 78.0 - 100.0 fl   MCHC 34.7 30.0 - 36.0 g/dL   RDW 14.6 11.5 - 15.5 %  Platelets 231.0 150.0 - 400.0 K/uL   Neutrophils Relative % 63.6 43.0 - 77.0 %   Lymphocytes Relative 22.7 12.0 - 46.0 %   Monocytes Relative 9.4 3.0 - 12.0 %   Eosinophils Relative 3.2 0.0 - 5.0 %   Basophils Relative 1.1 0.0 - 3.0 %   Neutro Abs 3.8 1.4 - 7.7 K/uL   Lymphs Abs 1.4 0.7 - 4.0 K/uL   Monocytes Absolute 0.6 0.1 - 1.0 K/uL   Eosinophils Absolute 0.2 0.0 - 0.7 K/uL   Basophils Absolute 0.1 0.0 - 0.1 K/uL  TSH  Result Value Ref Range   TSH 2.54 0.35 - 4.50 uIU/mL  Lipid Profile  Result Value Ref Range   Cholesterol 141 0 - 200 mg/dL   Triglycerides 87.0 0.0 - 149.0 mg/dL   HDL 48.80 >39.00 mg/dL   VLDL 17.4 0.0 - 40.0 mg/dL   LDL Cholesterol 75 0 - 99 mg/dL   Total CHOL/HDL Ratio 3    NonHDL 92.20   Comp Met (CMET)  Result Value Ref Range   Sodium 141 135 - 145 mEq/L   Potassium 4.5 3.5 - 5.1 mEq/L   Chloride 105 96 - 112 mEq/L   CO2 30 19 - 32 mEq/L   Glucose, Bld 104 (H) 70 - 99 mg/dL   BUN 13 6 - 23 mg/dL   Creatinine, Ser 1.16 0.40 - 1.50 mg/dL   Total Bilirubin 1.0 0.2 - 1.2 mg/dL   Alkaline Phosphatase 54 39 - 117 U/L   AST 20 0 - 37 U/L   ALT 19 0 - 53 U/L   Total Protein 6.9 6.0 - 8.3 g/dL   Albumin 4.5 3.5 - 5.2 g/dL   Calcium 9.8 8.4 - 10.5 mg/dL   GFR 66.82 >60.00 mL/min   Assessment and Plan   Allergic rhinitis Well controlled.  No signs of complications, medication side effects, or red flags.  Continue current regimen.    Rheumatoid arthritis ([place]), on Enbrel, followed by Dr. Amil Amen [facility] [place]. Previously on MTX, but LFTs elevated. Well controlled.  No signs of complications, medication side effects, or red flags.  Continue current regimen.    Lab Results  Component Value Date   ALT 19 03/02/2018    AST 20 03/02/2018   ALKPHOS 54 03/02/2018   BILITOT 1.0 03/02/2018    Pulmonary nodule, left Discovered on CXR, CT when evaluating rib fractures. Will be establishing with Dr. Janace Hoard in April. "All good" with periodic monitoring. Former smoker of 1 pack per week, stopped > 20 years ago. 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 Lat CT chest 2019:   1. No change in size of LEFT lung pulmonary nodules which are not significantly hypermetabolic on comparison FDG PET scan. Findings continue to be consistent with benign nodules. Consider follow-up CT in 12 months.  Insomnia, controlled with prn Lorazepam, uses sparingly, been taking for years Well controlled.  No signs of complications, medication side effects, or red flags.  Continue current regimen.  Discussed fall precautions.  Left inguinal hernia, fat filled, with no complications Incidental finding on CT. Will monitor.   Hypercholesteremia Trying to exercise on a regular basis? Yes. Compliant with diet? Yes. Not on statin.   Lab Results  Component Value Date   CHOL 141 03/02/2018   HDL 48.80 03/02/2018   LDLCALC 75 03/02/2018   TRIG 87.0 03/02/2018   CHOLHDL 3 03/02/2018   Lab Results  Component Value Date   ALT 19 03/02/2018  AST 20 03/02/2018   ALKPHOS 54 03/02/2018   BILITOT 1.0 03/02/2018     The 10-year ASCVD risk score Mikey Bussing DC Jr., et al., 2013) is: 11.4%   Values used to calculate the score:     Age: 65 years     Sex: Male     Is Non-Hispanic African American: No     Diabetic: No     Tobacco smoker: No     Systolic Blood Pressure: 570 mmHg     Is BP treated: No     HDL Cholesterol: 48.8 mg/dL     Total Cholesterol: 141 mg/dL  History of GI bleed Gastrointestinal ROS: no abdominal pain, change in bowel habits, or black or bloody stools.  Continues PPI. Eats well. Followed by Dr. Collene Mares.  GERD (gastroesophageal reflux disease), on Omeprazole Well controlled.  No signs of complications,  medication side effects, or red flags.  Continue current regimen.    Diverticulosis of large intestine With history of diverticulitis. No abdominal pain or bowel habit changes.   Former smoker Stopped > 20 years ago.   Atrial fibrillation ([place]), on Cardizem daily, with prn ASA and Diltiazem History: Review: taking medications as instructed, no medication side effects noted, no TIAs, no chest pain on exertion, no dyspnea on exertion, no swelling of ankles. Smoker: No.  BP Readings from Last 3 Encounters:  06/26/18 130/86  03/30/18 128/80  03/02/18 118/70   Lab Results  Component Value Date   CREATININE 1.16 03/02/2018     Assessment/Plan: 1. Medication: no change. 2. Dietary sodium restriction. 3. Regular aerobic exercise. 4. Check blood pressures daily and record.   Meds ordered this encounter  Medications  . LORazepam (ATIVAN) 1 MG tablet    Sig: Take 1 tablet (1 mg total) by mouth at bedtime. TAKE 1/2 TO 1 TABLET BY MOUTH 3 TIMES A DAY AS NEEDED    Dispense:  90 tablet    Refill:  1    . Reviewed expectations re: course of current medical issues. . Discussed self-management of symptoms. . Outlined signs and symptoms indicating need for more acute intervention. . Patient verbalized understanding and all questions were answered. [name] Health Maintenance issues including appropriate healthy diet, exercise, and smoking avoidance were discussed with patient. . See orders for this visit as documented in the electronic medical record. . Patient received an After Visit Summary.  Briscoe Deutscher, DO [facility] 07/01/2018  Records requested if needed. Time spent with the patient: 45 minutes, of which >50% was spent in obtaining information about his symptoms, reviewing his previous labs, evaluations, and treatments, counseling him about his condition (please see the discussed topics above), and developing a plan to further investigate it; he had a number of questions  which I addressed.

## 2018-06-26 ENCOUNTER — Ambulatory Visit (INDEPENDENT_AMBULATORY_CARE_PROVIDER_SITE_OTHER): Payer: Medicare Other | Admitting: Family Medicine

## 2018-06-26 ENCOUNTER — Ambulatory Visit: Payer: Self-pay | Admitting: *Deleted

## 2018-06-26 ENCOUNTER — Telehealth: Payer: Self-pay | Admitting: Family Medicine

## 2018-06-26 ENCOUNTER — Encounter: Payer: Self-pay | Admitting: Family Medicine

## 2018-06-26 VITALS — BP 130/86 | HR 62 | Temp 97.8°F | Ht 71.0 in | Wt 206.0 lb

## 2018-06-26 DIAGNOSIS — Z87891 Personal history of nicotine dependence: Secondary | ICD-10-CM

## 2018-06-26 DIAGNOSIS — K5730 Diverticulosis of large intestine without perforation or abscess without bleeding: Secondary | ICD-10-CM | POA: Diagnosis not present

## 2018-06-26 DIAGNOSIS — R911 Solitary pulmonary nodule: Secondary | ICD-10-CM | POA: Diagnosis not present

## 2018-06-26 DIAGNOSIS — F5101 Primary insomnia: Secondary | ICD-10-CM | POA: Diagnosis not present

## 2018-06-26 DIAGNOSIS — E7800 Pure hypercholesterolemia, unspecified: Secondary | ICD-10-CM

## 2018-06-26 DIAGNOSIS — K219 Gastro-esophageal reflux disease without esophagitis: Secondary | ICD-10-CM

## 2018-06-26 DIAGNOSIS — J301 Allergic rhinitis due to pollen: Secondary | ICD-10-CM

## 2018-06-26 DIAGNOSIS — M069 Rheumatoid arthritis, unspecified: Secondary | ICD-10-CM

## 2018-06-26 DIAGNOSIS — I480 Paroxysmal atrial fibrillation: Secondary | ICD-10-CM | POA: Diagnosis not present

## 2018-06-26 DIAGNOSIS — K4090 Unilateral inguinal hernia, without obstruction or gangrene, not specified as recurrent: Secondary | ICD-10-CM

## 2018-06-26 DIAGNOSIS — Z8719 Personal history of other diseases of the digestive system: Secondary | ICD-10-CM | POA: Diagnosis not present

## 2018-06-26 MED ORDER — OSELTAMIVIR PHOSPHATE 75 MG PO CAPS: 75.0000 mg | ORAL_CAPSULE | Freq: Every day | ORAL | 0 refills | Status: DC

## 2018-06-26 MED ORDER — LORAZEPAM 1 MG PO TABS: 1.0000 mg | ORAL_TABLET | Freq: Every day | ORAL | 1 refills | Status: DC

## 2018-06-26 NOTE — Telephone Encounter (Signed)
Spoke to pt told him will send Tamiflu to the pharmacy for you once capsule daily x 5 days. Pt verbalized understanding.

## 2018-06-26 NOTE — Addendum Note (Signed)
Addended by: Marian Sorrow on: 06/26/2018 03:37 PM   Modules accepted: Orders

## 2018-06-26 NOTE — Patient Instructions (Signed)
It was nice to meet you today! Follow up for your physical in October.

## 2018-06-26 NOTE — Telephone Encounter (Signed)
Copied from [place] [id number]. Topic: Quick Communication - See Telephone Encounter >> Jun 26, 2018  6:53 PM Blase Mess A wrote: CRM for notification. See Telephone encounter for: 06/26/18.  Patient wife is requesting the script for oseltamivir (TAMIFLU) 75 MG capsule [[id number]]  to be script to be resent. The pharmacy does not have it. [id number] [place], [place] [phone] (Phone) [phone] (Fax)

## 2018-06-26 NOTE — Telephone Encounter (Signed)
Contacted pt to address his concerns; he says that on 06/25/2018 he was with his son who was diagnosed with influenza A on 06/26/2018; the pt has no symptoms; he also says that he did receive his flu vaccine;  Recommendations made per nurse triage i protocol; he uses [facility]; the pt can be contacted at [phone] (home) or [phone] cell); a message can be left at either number; the pt normally sees Dr Briscoe Deutscher, [facility]; will route to office for final disposition.  Reason for Disposition . [1] Influenza EXPOSURE (Close Contact) within last 48 hours (2 days) AND [2] exposed person is HIGH RISK (e.g., age > 51 years, pregnant, HIV+, chronic medical condition)  Answer Assessment - Initial Assessment Questions 1. TYPE of EXPOSURE: "How were you exposed?" (e.g., close contact, not a close contact)    Close contact with child  2. DATE of EXPOSURE: "When did the exposure occur?" (e.g., hour, days, weeks)     06/25/2018 3. PREGNANCY: "Is there any chance you are pregnant?" "When was your last menstrual period?"     n/a 4. HIGH RISK for COMPLICATIONS: "Do you have any heart or lung problems? Do you have a weakened immune system?" (e.g., CHF, COPD, asthma, HIV positive, chemotherapy, renal failure, diabetes mellitus, sickle cell anemia)     Weaked immune system; take enbrel for RA  5. SYMPTOMS: "Do you have any symptoms?" (e.g., cough, fever, sore throat, difficulty breathing).     no  Protocols used: INFLUENZA EXPOSURE-A-AH

## 2018-06-26 NOTE — Telephone Encounter (Signed)
See note

## 2018-06-27 NOTE — Telephone Encounter (Signed)
Spoke to pt's wife left her know Rx was sent to pharmacy. Mrs. Melin said pt did pick it up and has started it. Told her okay.

## 2018-06-27 NOTE — Telephone Encounter (Signed)
See note

## 2018-06-27 NOTE — Telephone Encounter (Signed)
Called in script to pharmacy.  Have called to let patient know but number is busy.

## 2018-06-27 NOTE — Telephone Encounter (Signed)
Pt calling in.  States that Tamiflu RX still has not been received by pharmacy.  States he spoke with them last night and this morning.  Pt would like a call once this has been called in. Pt can be reached at [phone]

## 2018-07-01 ENCOUNTER — Encounter: Payer: Self-pay | Admitting: Family Medicine

## 2018-07-01 DIAGNOSIS — G4700 Insomnia, unspecified: Secondary | ICD-10-CM | POA: Insufficient documentation

## 2018-07-01 DIAGNOSIS — K4090 Unilateral inguinal hernia, without obstruction or gangrene, not specified as recurrent: Secondary | ICD-10-CM | POA: Insufficient documentation

## 2018-07-01 DIAGNOSIS — K219 Gastro-esophageal reflux disease without esophagitis: Secondary | ICD-10-CM | POA: Insufficient documentation

## 2018-07-01 DIAGNOSIS — Z8719 Personal history of other diseases of the digestive system: Secondary | ICD-10-CM | POA: Insufficient documentation

## 2018-07-01 NOTE — Assessment & Plan Note (Signed)
Well controlled.  No signs of complications, medication side effects, or red flags.  Continue current regimen.  Discussed fall precautions.

## 2018-07-01 NOTE — Assessment & Plan Note (Signed)
Trying to exercise on a regular basis? Yes. Compliant with diet? Yes. Not on statin.   Lab Results  Component Value Date   CHOL 141 03/02/2018   HDL 48.80 03/02/2018   LDLCALC 75 03/02/2018   TRIG 87.0 03/02/2018   CHOLHDL 3 03/02/2018   Lab Results  Component Value Date   ALT 19 03/02/2018   AST 20 03/02/2018   ALKPHOS 54 03/02/2018   BILITOT 1.0 03/02/2018     The 10-year ASCVD risk score Mikey Bussing DC Jr., et al., 2013) is: 11.4%   Values used to calculate the score:     Age: 68 years     Sex: Male     Is Non-Hispanic African American: No     Diabetic: No     Tobacco smoker: No     Systolic Blood Pressure: 670 mmHg     Is BP treated: No     HDL Cholesterol: 48.8 mg/dL     Total Cholesterol: 141 mg/dL

## 2018-07-01 NOTE — Assessment & Plan Note (Signed)
Gastrointestinal ROS: no abdominal pain, change in bowel habits, or black or bloody stools.  Continues PPI. Eats well. Followed by Dr. Collene Mares.

## 2018-07-01 NOTE — Assessment & Plan Note (Signed)
Well controlled.  No signs of complications, medication side effects, or red flags.  Continue current regimen.   

## 2018-07-01 NOTE — Assessment & Plan Note (Signed)
With history of diverticulitis. No abdominal pain or bowel habit changes.

## 2018-07-01 NOTE — Assessment & Plan Note (Addendum)
On Embrel. Previously on MTX, but LFTs elevated. Well controlled.  No signs of complications, medication side effects, or red flags.  Continue current regimen.    Lab Results  Component Value Date   ALT 19 03/02/2018   AST 20 03/02/2018   ALKPHOS 54 03/02/2018   BILITOT 1.0 03/02/2018

## 2018-07-01 NOTE — Assessment & Plan Note (Signed)
Stopped > 20 years ago.

## 2018-07-01 NOTE — Assessment & Plan Note (Addendum)
Discovered on CXR, CT when evaluating rib fractures. Will be establishing with Dr. Janace Hoard in April. "All good" with periodic monitoring. Former smoker of 1 pack per week, stopped > 20 years ago. 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 Lat CT chest 2019:   1. No change in size of LEFT lung pulmonary nodules which are not significantly hypermetabolic on comparison FDG PET scan. Findings continue to be consistent with benign nodules. Consider follow-up CT in 12 months.

## 2018-07-01 NOTE — Assessment & Plan Note (Signed)
Incidental finding on CT. Will monitor.

## 2018-07-01 NOTE — Assessment & Plan Note (Signed)
History: Review: taking medications as instructed, no medication side effects noted, no TIAs, no chest pain on exertion, no dyspnea on exertion, no swelling of ankles. Smoker: No.  BP Readings from Last 3 Encounters:  06/26/18 130/86  03/30/18 128/80  03/02/18 118/70   Lab Results  Component Value Date   CREATININE 1.16 03/02/2018     Assessment/Plan: 1. Medication: no change. 2. Dietary sodium restriction. 3. Regular aerobic exercise. 4. Check blood pressures daily and record.

## 2018-07-19 DIAGNOSIS — E663 Overweight: Secondary | ICD-10-CM | POA: Diagnosis not present

## 2018-07-19 DIAGNOSIS — M0579 Rheumatoid arthritis with rheumatoid factor of multiple sites without organ or systems involvement: Secondary | ICD-10-CM | POA: Diagnosis not present

## 2018-07-19 DIAGNOSIS — Z79899 Other long term (current) drug therapy: Secondary | ICD-10-CM | POA: Diagnosis not present

## 2018-07-19 DIAGNOSIS — Z6828 Body mass index (BMI) 28.0-28.9, adult: Secondary | ICD-10-CM | POA: Diagnosis not present

## 2018-07-19 LAB — CBC AND DIFFERENTIAL
HCT: 44 (ref 41–53)
HEMOGLOBIN: 14.7 (ref 13.5–17.5)
PLATELETS: 247 (ref 150–399)
WBC: 6.5

## 2018-07-19 LAB — BASIC METABOLIC PANEL
BUN: 12 (ref 4–21)
Creatinine: 1.2 (ref 0.6–1.3)
Glucose: 84
Potassium: 4.2 (ref 3.4–5.3)
Sodium: 141 (ref 137–147)

## 2018-07-19 LAB — HEPATIC FUNCTION PANEL
ALT: 15 (ref 10–40)
AST: 22 (ref 14–40)
Alkaline Phosphatase: 60 (ref 25–125)

## 2018-07-21 ENCOUNTER — Other Ambulatory Visit: Payer: Self-pay | Admitting: Internal Medicine

## 2018-08-01 ENCOUNTER — Encounter: Payer: Self-pay | Admitting: Physician Assistant

## 2018-08-10 ENCOUNTER — Other Ambulatory Visit: Payer: Self-pay | Admitting: Family Medicine

## 2018-08-10 MED ORDER — LORAZEPAM 1 MG PO TABS: 1.0000 mg | ORAL_TABLET | Freq: Every day | ORAL | 1 refills | Status: DC

## 2018-08-10 NOTE — Telephone Encounter (Signed)
Copied from [place] [id number]. Topic: Quick Communication - Rx Refill/Question >> Aug 10, 2018 10:13 AM Reyne Dumas L wrote: Medication: LORazepam (ATIVAN) 1 MG tablet  Has the patient contacted their pharmacy? Yes - states they haven't heard from [place] (Agent: If no, request that the patient contact the pharmacy for the refill.) (Agent: If yes, when and what did the pharmacy advise?)  Preferred Pharmacy (with phone number or street name): [facility], [place] [phone] (Phone) [phone] (Fax)  Agent: Please be advised that RX refills may take up to 3 business days. We ask that you follow-up with your pharmacy.

## 2018-08-10 NOTE — Telephone Encounter (Signed)
Yes

## 2018-08-10 NOTE — Telephone Encounter (Signed)
Ok to fill for patient.

## 2018-08-10 NOTE — Telephone Encounter (Signed)
See note

## 2018-08-31 ENCOUNTER — Ambulatory Visit (INDEPENDENT_AMBULATORY_CARE_PROVIDER_SITE_OTHER): Payer: Medicare Other | Admitting: Physician Assistant

## 2018-08-31 ENCOUNTER — Encounter: Payer: Self-pay | Admitting: Physician Assistant

## 2018-08-31 ENCOUNTER — Ambulatory Visit: Payer: Medicare Other | Admitting: Physician Assistant

## 2018-08-31 DIAGNOSIS — W57XXXA Bitten or stung by nonvenomous insect and other nonvenomous arthropods, initial encounter: Secondary | ICD-10-CM

## 2018-08-31 DIAGNOSIS — S30860A Insect bite (nonvenomous) of lower back and pelvis, initial encounter: Secondary | ICD-10-CM | POA: Diagnosis not present

## 2018-08-31 MED ORDER — DOXYCYCLINE HYCLATE 100 MG PO TABS: 100.0000 mg | ORAL_TABLET | Freq: Two times a day (BID) | ORAL | 0 refills | Status: DC

## 2018-08-31 NOTE — Patient Instructions (Signed)

## 2018-08-31 NOTE — Progress Notes (Signed)
Virtual Visit via Video   I connected with William West on 08/31/18 at 11:20 AM EDT by a video enabled telemedicine application and verified that I am speaking with the correct person using two identifiers. Location patient: Home Location provider: [facility], Office Persons participating in the virtual visit: [place], [place], [place], [place].   I discussed the limitations of evaluation and management by telemedicine and the availability of in person appointments. The patient expressed understanding and agreed to proceed.  Subjective:   HPI: Tick bite Pt found tick yesterday on his right buttock when taking a shower, was removed by his wife and cleaned with alcohol, neosporin applied and bandaid. Pt said it is very itchy. States that it was attached for <12 hours.  Area with about a dime-size of erythema.  Denies: fevers, chills, discharge from area, fatigue, neck stiffness  ROS: See pertinent positives and negatives per HPI.  Patient Active Problem List   Diagnosis Date Noted  . GERD (gastroesophageal reflux disease), on Omeprazole 07/01/2018  . History of GI bleed 07/01/2018  . Insomnia, controlled with prn Lorazepam, uses sparingly, been taking for years 07/01/2018  . Left inguinal hernia, fat filled, with no complications 18/29/9371  . Abnormal CT of the chest 03/03/2018  . Sensorineural hearing loss (SNHL), bilateral 12/28/2017  . Tinnitus, bilateral 12/28/2017  . Pulmonary nodule, left 12/09/2016  . Former smoker 01/16/2015  . Atrial fibrillation ([place]), on Cardizem daily, with prn ASA and Diltiazem 04/25/2012  . Rheumatoid arthritis ([place]), on Enbrel, followed by Dr. Amil Amen 01/06/2011  . Allergic rhinitis 04/22/2009  . Hypercholesteremia 10/24/2008  . Diverticulosis of large intestine 06/21/2007    Social History   Tobacco Use  . Smoking status: Former Smoker    Packs/day: 0.50    Years: 45.00    Pack years: 22.50    Types:  Cigarettes    Last attempt to quit: 11/13/2009    Years since quitting: 8.8  . Smokeless tobacco: Never Used  Substance Use Topics  . Alcohol use: No    Alcohol/week: 0.0 standard drinks    Current Outpatient Medications:  .  Acetaminophen (TYLENOL EXTRA STRENGTH PO), Take by mouth. 2 tabs as needed, Disp: , Rfl:  .  aspirin 81 MG tablet, Take 81 mg by mouth as needed. , Disp: , Rfl:  .  Bismuth Subsalicylate (DIARRHEA PO), Take by mouth. [facility] Diarrhea Control as needed, Disp: , Rfl:  .  diltiazem (CARDIZEM CD) 120 MG 24 hr capsule, TAKE 1 CAPSULE (120 MG TOTAL) BY MOUTH DAILY., Disp: 90 capsule, Rfl: 3 .  etanercept (ENBREL) 50 MG/ML injection, Inject 50 mg into the skin once a week. On mondays, Disp: , Rfl:  .  ibuprofen (ADVIL,MOTRIN) 200 MG tablet, Take 200 mg by mouth every 8 (eight) hours as needed., Disp: , Rfl:  .  LORazepam (ATIVAN) 1 MG tablet, Take 1 tablet (1 mg total) by mouth at bedtime. TAKE 1/2 TO 1 TABLET BY MOUTH 3 TIMES A DAY AS NEEDED, Disp: 90 tablet, Rfl: 1 .  Multiple Vitamin (MULTIVITAMIN) tablet, Take 1 tablet by mouth daily., Disp: , Rfl:  .  omeprazole (PRILOSEC) 40 MG capsule, Take 40 mg by mouth daily., Disp: , Rfl:  .  Probiotic Product (PROBIOTIC DAILY PO), Take 1 tablet by mouth daily. Renewlife Ultimate Flora, Disp: , Rfl:  .  vitamin C (ASCORBIC ACID) 500 MG tablet, Take 500 mg by mouth daily. , Disp: , Rfl:  .  doxycycline (VIBRA-TABS) 100 MG tablet, Take 1 tablet (100 mg total) by mouth 2 (two) times daily., Disp: 20 tablet, Rfl: 0  Allergies  Allergen Reactions  . Caffeine Other (See Comments)    Causes him to go into afib  . Flecainide Other (See Comments)    Caused heart to race too fast and had him in the ED  . Methotrexate Derivatives Nausea And Vomiting    Increased liver enzymes  . Pseudoephedrine-Ibuprofen Other (See Comments)    " afib"   . Penicillins Rash    .Marland KitchenHas patient had a PCN reaction causing immediate rash,  facial/tongue/throat swelling, SOB or lightheadedness with hypotension: No, YES TO RASH  Has patient had a PCN reaction causing severe rash involving mucus membranes or skin necrosis: No Has patient had a PCN reaction that required hospitalization No Has patient had a PCN reaction occurring within the last 10 years: No If all of the above answers are "NO", then may proceed with Cephalosporin use.    Objective:   VITALS: Per patient if applicable, see vitals. GENERAL: Alert, appears well and in no acute distress. HEENT: Atraumatic, conjunctiva clear, no obvious abnormalities on inspection of external nose and ears. NECK: Normal movements of the head and neck. CARDIOPULMONARY: No increased WOB. Speaking in clear sentences. I:E ratio WNL.  MS: Moves all visible extremities without noticeable abnormality. PSYCH: Pleasant and cooperative, well-groomed. Speech normal rate and rhythm. Affect is appropriate. Insight and judgement are appropriate. Attention is focused, linear, and appropriate.  NEURO: CN grossly intact. Oriented as arrived to appointment on time with no prompting. Moves both UE equally.  SKIN: No obvious lesions, wounds, erythema, or cyanosis noted on face or hands.  Assessment and Plan:   William West was seen today for insect bite.  Diagnoses and all orders for this visit:  Insect bite of lower back, initial encounter  Other orders -     doxycycline (VIBRA-TABS) 100 MG tablet; Take 1 tablet (100 mg total) by mouth 2 (two) times daily.   Patient declined to show me the area, however he describes it as slightly red, no open area and no tenderness. He is requesting doxycycline. I have sent this in. Worsening precautions advised. Encouraged use of probiotics and avoidance of sun while on doxy.  . Reviewed expectations re: course of current medical issues. . Discussed self-management of symptoms. . Outlined signs and symptoms indicating need for more acute intervention. . Patient  verbalized understanding and all questions were answered. [name] Health Maintenance issues including appropriate healthy diet, exercise, and smoking avoidance were discussed with patient. . See orders for this visit as documented in the electronic medical record.  I discussed the assessment and treatment plan with the patient. The patient was provided an opportunity to ask questions and all were answered. The patient agreed with the plan and demonstrated an understanding of the instructions.   The patient was advised to call back or seek an in-person evaluation if the symptoms worsen or if the condition fails to improve as anticipated.  CMA or LPN served as scribe during this visit. History, Physical, and Plan performed by medical provider. The above documentation has been reviewed and is accurate and complete.   [place], [place] 08/31/2018

## 2018-09-04 ENCOUNTER — Ambulatory Visit: Payer: Medicare Other | Admitting: Emergency Medicine

## 2018-11-30 ENCOUNTER — Ambulatory Visit (INDEPENDENT_AMBULATORY_CARE_PROVIDER_SITE_OTHER): Payer: Medicare Other | Admitting: Physician Assistant

## 2018-11-30 ENCOUNTER — Encounter: Payer: Self-pay | Admitting: Physician Assistant

## 2018-11-30 VITALS — BP 128/66 | HR 83 | Temp 97.8°F | Ht 71.0 in | Wt 200.0 lb

## 2018-11-30 DIAGNOSIS — S30860A Insect bite (nonvenomous) of lower back and pelvis, initial encounter: Secondary | ICD-10-CM

## 2018-11-30 DIAGNOSIS — W57XXXA Bitten or stung by nonvenomous insect and other nonvenomous arthropods, initial encounter: Secondary | ICD-10-CM | POA: Diagnosis not present

## 2018-11-30 MED ORDER — DOXYCYCLINE HYCLATE 100 MG PO TABS: 100.0000 mg | ORAL_TABLET | Freq: Two times a day (BID) | ORAL | 0 refills | Status: DC

## 2018-11-30 NOTE — Progress Notes (Signed)
Virtual Visit via Video   Due to the COVID-19 pandemic, this visit was completed with telemedicine (audio/video) technology to reduce patient and provider exposure as well as to preserve personal protective equipment.   I connected with William West by a video enabled telemedicine application and verified that I am speaking with the correct person using two identifiers. Location patient: Home Location provider: [facility], Office Persons participating in the virtual visit: William West, William West, William West, [place] acting as scribe for William West, William   I discussed the limitations of evaluation and management by telemedicine and the availability of in person appointments. The patient expressed understanding and agreed to proceed.  Care Team   Patient Care Team: William Deutscher, DO as PCP - General (Family Medicine) William Duos, MD as Consulting Physician (Rheumatology) William Craver, MD as Consulting Physician (Gastroenterology)  Subjective:   HPI:   Patient removed tick from back with alcohol yesterday. Tick was very small pretty sure it was deer tick. Has redness and itching at area it was removed. He has not had any fever, headache, neck stiffness or nausea or vomiting. He did get a round of doxy for tick bite on 08/31/2018. He does take probiotic with any antibiotic use.    Review of Systems  Constitutional: Negative for chills and fever.  HENT: Negative for hearing loss and tinnitus.   Eyes: Negative for blurred vision and double vision.  Respiratory: Negative for cough and wheezing.   Cardiovascular: Negative for chest pain, palpitations and leg swelling.  Gastrointestinal: Negative for nausea and vomiting.  Genitourinary: Negative for dysuria and urgency.  Musculoskeletal: Negative for myalgias.  Skin: Positive for itching and rash.       Started after tick bite in upper back   Neurological: Negative for dizziness and headaches.   Psychiatric/Behavioral: Negative for depression and suicidal ideas.     Patient Active Problem List   Diagnosis Date Noted  . GERD (gastroesophageal reflux disease), on Omeprazole 07/01/2018  . History of GI bleed 07/01/2018  . Insomnia, controlled with prn Lorazepam, uses sparingly, been taking for years 07/01/2018  . Left inguinal hernia, fat filled, with West complications 93/81/0175  . Abnormal CT of the chest 03/03/2018  . Sensorineural hearing loss (SNHL), bilateral 12/28/2017  . Tinnitus, bilateral 12/28/2017  . Pulmonary nodule, left 12/09/2016  . Former smoker 01/16/2015  . Atrial fibrillation ([place]), on Cardizem daily, with prn ASA and Diltiazem 04/25/2012  . Rheumatoid arthritis ([place]), on Enbrel, followed by William West 01/06/2011  . Allergic rhinitis 04/22/2009  . Hypercholesteremia 10/24/2008  . Diverticulosis of large intestine 06/21/2007    Social History   Tobacco Use  . Smoking status: Former Smoker    Packs/day: 0.50    Years: 45.00    Pack years: 22.50    Types: Cigarettes    Quit date: 11/13/2009    Years since quitting: 9.0  . Smokeless tobacco: Never Used  Substance Use Topics  . Alcohol use: West    Alcohol/week: 0.0 standard drinks    Current Outpatient Medications:  .  Acetaminophen (TYLENOL EXTRA STRENGTH PO), Take by mouth. 2 tabs as needed, Disp: , Rfl:  .  Bismuth Subsalicylate (DIARRHEA PO), Take by mouth. [facility] Diarrhea Control as needed, Disp: , Rfl:  .  diltiazem (CARDIZEM CD) 120 MG 24 hr capsule, TAKE 1 CAPSULE (120 MG TOTAL) BY MOUTH DAILY., Disp: 90 capsule, Rfl: 3 .  etanercept (ENBREL) 50 MG/ML injection, Inject 50 mg into  the skin once a week. On mondays, Disp: , Rfl:  .  ibuprofen (ADVIL,MOTRIN) 200 MG tablet, Take 200 mg by mouth every 8 (eight) hours as needed., Disp: , Rfl:  .  LORazepam (ATIVAN) 1 MG tablet, Take 1 tablet (1 mg total) by mouth at bedtime. TAKE 1/2 TO 1 TABLET BY MOUTH 3 TIMES A DAY AS NEEDED, Disp: 90 tablet, Rfl: 1  .  Multiple Vitamin (MULTIVITAMIN) tablet, Take 1 tablet by mouth daily., Disp: , Rfl:  .  omeprazole (PRILOSEC) 40 MG capsule, Take 40 mg by mouth daily., Disp: , Rfl:  .  vitamin C (ASCORBIC ACID) 500 MG tablet, Take 500 mg by mouth daily. , Disp: , Rfl:  .  doxycycline (VIBRA-TABS) 100 MG tablet, Take 1 tablet (100 mg total) by mouth 2 (two) times daily., Disp: 20 tablet, Rfl: 0  Allergies  Allergen Reactions  . Caffeine Other (See Comments)    Causes him to go into afib  . Flecainide Other (See Comments)    Caused heart to race too fast and had him in the ED  . Methotrexate Derivatives Nausea And Vomiting    Increased liver enzymes  . Pseudoephedrine-Ibuprofen Other (See Comments)    " afib"   . Penicillins Rash    .Marland KitchenHas patient had a PCN reaction causing immediate rash, facial/tongue/throat swelling, SOB or lightheadedness with hypotension: West, YES TO RASH  Has patient had a PCN reaction causing severe rash involving mucus membranes or skin necrosis: West Has patient had a PCN reaction that required hospitalization West Has patient had a PCN reaction occurring within the last 10 years: West If all of the above answers are "West", then may proceed with Cephalosporin use.    Objective:   Vitals:   11/30/18 0933  BP: 128/66  Pulse: 83  Temp: 97.8 F (36.6 C)  SpO2: 98%    VITALS: Per patient if applicable, see vitals. GENERAL: Alert, appears well and in West acute distress. HEENT: Atraumatic, conjunctiva clear, West obvious abnormalities on inspection of external nose and ears. NECK: Normal movements of the head and neck. CARDIOPULMONARY: West increased WOB. Speaking in clear sentences. I:E ratio WNL.  MS: Moves all visible extremities without noticeable abnormality. PSYCH: Pleasant and cooperative, well-groomed. Speech normal rate and rhythm. Affect is appropriate. Insight and judgement are appropriate. Attention is focused, linear, and appropriate.  NEURO: CN grossly intact. Oriented  as arrived to appointment on time with West prompting. Moves both UE equally.  SKIN: small erythematous lesion to back (see photo)    Assessment and Plan:   William West was seen today for insect bite.  Diagnoses and all orders for this visit:  Insect bite of lower back, initial encounter  Other orders -     doxycycline (VIBRA-TABS) 100 MG tablet; Take 1 tablet (100 mg total) by mouth 2 (two) times daily.   West red flags on exam. Will start oral antibiotic, doxycycline per patient request. He is aware of side effects of medication as well as worsening precautions for tick bites and rash. Follow-up as needed.  [name] COVID-19 Education: The signs and symptoms of COVID-19 were discussed with the patient and how to seek care for testing if needed. The importance of social distancing was discussed today. . Reviewed expectations re: course of current medical issues. . Discussed self-management of symptoms. . Outlined signs and symptoms indicating need for more acute intervention. . Patient verbalized understanding and all questions were answered. [name] Health Maintenance issues including appropriate healthy diet, exercise,  and smoking avoidance were discussed with patient. . See orders for this visit as documented in the electronic medical record.  William Coke, PA  CMA or LPN served as scribe during this visit. History, Physical, and Plan performed by medical provider. The above documentation has been reviewed and is accurate and complete.

## 2018-12-11 ENCOUNTER — Telehealth: Payer: Self-pay | Admitting: Emergency Medicine

## 2018-12-11 NOTE — Telephone Encounter (Signed)
Spoke with the pt's spouse and confirmed that the pt is not having any covid symptoms  He has only traveled to [place] to get gas  Nothing further needed

## 2018-12-12 ENCOUNTER — Other Ambulatory Visit: Payer: Self-pay

## 2018-12-12 ENCOUNTER — Encounter: Payer: Self-pay | Admitting: Emergency Medicine

## 2018-12-12 ENCOUNTER — Ambulatory Visit (INDEPENDENT_AMBULATORY_CARE_PROVIDER_SITE_OTHER): Payer: Medicare Other | Admitting: Emergency Medicine

## 2018-12-12 DIAGNOSIS — R911 Solitary pulmonary nodule: Secondary | ICD-10-CM | POA: Diagnosis not present

## 2018-12-12 NOTE — Progress Notes (Signed)
Subjective:    Patient ID: William West, male    DOB: [birth year], 68 y.o.   MRN: [id number]  HPI 68 year old man formally followed by Dr. Lenna Gilford in our office.  Has a history of former tobacco, no documented COPD.  PMH atrial fibrillation, hypercholesterolemia, rheumatoid arthritis for which she has been treated with methotrexate remotely, currently weekly Enbrel (Dr. Amil Amen).  He has pulmonary nodular disease that is been followed by CT scan of the chest, 10 mm left upper lobe, 7 mm left lower lobe on most recent CT 09/06/2017.  PET negative 03/08/2017.  He has a chronic cough, sometimes some clear mucous in the am, seems to be worse in the allergy season. He is on omeprazole. Recently treated with empiric doxy after tick bites. Denies any dyspnea, CP. He has had some nasal obstruction with congestion.    Review of Systems  Past Medical History:  Diagnosis Date  . A-fib ([place])   . Acute blood loss anemia 01/26/2015  . Allergic rhinitis   . Diverticulosis of colon   . Dysplastic nevus   . GERD (gastroesophageal reflux disease)   . GI bleeding 01/24/2015  . Hypercholesterolemia   . Postural dizziness with near syncope due to dehydration 12/09/2016  . RA (rheumatoid arthritis) ([facility])      Family History  Problem Relation Age of Onset  . Prostate cancer Father   . Heart disease Mother   . Rheum arthritis Mother      Social History   Socioeconomic History  . Marital status: Married    Spouse name: Jeani Hawking  . Number of children: 2  . Years of education: Not on file  . Highest education level: Not on file  Occupational History  . Occupation: [employer]    Comment: RETIRED  Social Needs  . Financial resource strain: Not on file  . Food insecurity    Worry: Not on file    Inability: Not on file  . Transportation needs    Medical: Not on file    Non-medical: Not on file  Tobacco Use  . Smoking status: Former Smoker    Packs/day: 0.50    Years: 45.00    Pack years: 22.50   Types: Cigarettes    Quit date: 11/13/2009    Years since quitting: 9.0  . Smokeless tobacco: Never Used  Substance and Sexual Activity  . Alcohol use: No    Alcohol/week: 0.0 standard drinks  . Drug use: No  . Sexual activity: Never  Lifestyle  . Physical activity    Days per week: Not on file    Minutes per session: Not on file  . Stress: Not on file  Relationships  . Social Herbalist on phone: Not on file    Gets together: Not on file    Attends religious service: Not on file    Active member of club or organization: Not on file    Attends meetings of clubs or organizations: Not on file    Relationship status: Not on file  . Intimate partner violence    Fear of current or ex partner: Not on file    Emotionally abused: Not on file    Physically abused: Not on file    Forced sexual activity: Not on file  Other Topics Concern  . Not on file  Social History Narrative   One son with HBP. One son with IDDM. Exercises regularly at the [facility]. Eats well. Down 30 pounds with the  lifestyle change. Exercise keeps him from feeling so stiff. Weighs daily. Baseline 199-204.      Allergies  Allergen Reactions  . Caffeine Other (See Comments)    Causes him to go into afib  . Flecainide Other (See Comments)    Caused heart to race too fast and had him in the ED  . Methotrexate Derivatives Nausea And Vomiting    Increased liver enzymes  . Pseudoephedrine-Ibuprofen Other (See Comments)    " afib"   . Penicillins Rash    .Marland KitchenHas patient had a PCN reaction causing immediate rash, facial/tongue/throat swelling, SOB or lightheadedness with hypotension: No, YES TO RASH  Has patient had a PCN reaction causing severe rash involving mucus membranes or skin necrosis: No Has patient had a PCN reaction that required hospitalization No Has patient had a PCN reaction occurring within the last 10 years: No If all of the above answers are "NO", then may proceed with Cephalosporin use.      Outpatient Medications Prior to Visit  Medication Sig Dispense Refill  . Acetaminophen (TYLENOL EXTRA STRENGTH PO) Take by mouth. 2 tabs as needed    . aspirin EC 81 MG tablet Take 81 mg by mouth daily.    [name] diltiazem (CARDIZEM CD) 120 MG 24 hr capsule TAKE 1 CAPSULE (120 MG TOTAL) BY MOUTH DAILY. 90 capsule 3  . etanercept (ENBREL) 50 MG/ML injection Inject 50 mg into the skin once a week. On mondays    . ibuprofen (ADVIL,MOTRIN) 200 MG tablet Take 200 mg by mouth every 8 (eight) hours as needed.    [name] LORazepam (ATIVAN) 1 MG tablet Take 1 tablet (1 mg total) by mouth at bedtime. TAKE 1/2 TO 1 TABLET BY MOUTH 3 TIMES A DAY AS NEEDED 90 tablet 1  . Multiple Vitamin (MULTIVITAMIN) tablet Take 1 tablet by mouth daily.    [name] omeprazole (PRILOSEC) 40 MG capsule Take 40 mg by mouth daily.    . Probiotic Product (PROBIOTIC-10 ULTIMATE PO) Take 1 capsule by mouth daily.    . vitamin C (ASCORBIC ACID) 500 MG tablet Take 500 mg by mouth daily.     . Bismuth Subsalicylate (DIARRHEA PO) Take by mouth. [facility] Diarrhea Control as needed    . doxycycline (VIBRA-TABS) 100 MG tablet Take 1 tablet (100 mg total) by mouth 2 (two) times daily. 20 tablet 0   No facility-administered medications prior to visit.         Objective:   Physical Exam Vitals:   12/12/18 0934 12/12/18 0938  BP:  116/70  Pulse:  68  Temp: 97.9 F (36.6 C)   TempSrc: Oral   SpO2:  96%  Weight: 200 lb 9.6 oz (91 kg)   Height: 5' 11.5" (1.816 m)    Gen: Pleasant, well-nourished, in no distress,  normal affect  ENT: No lesions,  mouth clear,  oropharynx clear, no postnasal drip  Neck: No JVD, no stridor  Lungs: No use of accessory muscles, no crackles or wheezing on normal respiration, no wheeze on forced expiration  Cardiovascular: RRR, heart sounds normal, no murmur or gallops, no peripheral edema  Musculoskeletal: No deformities, no cyanosis or clubbing  Neuro: alert, awake, non focal  Skin: Warm, no lesions or  rash      Assessment & Plan:  Abnormal CT of the chest Repeat CT scan of the chest given his tobacco history.  Given the stability, PET scan results I suspect that these are rheumatoid nodules.  I will review his  CT with him when available  Allergic rhinitis Currently untreated.  Discussed possible nonsedating antihistamine with him today.  He will consider this depending on how much cough he is experiencing  GERD (gastroesophageal reflux disease), on Omeprazole Continue omeprazole  Former smoker Without any clinical evidence of COPD.  If he develops symptoms, dyspnea, increased mucus burden then I would perform pulmonary function testing and consider bronchodilator therapy.  Baltazar Apo, MD, PhD 12/12/2018, 10:01 AM [facility] [phone] or if no answer [phone]

## 2018-12-12 NOTE — Assessment & Plan Note (Signed)
Without any clinical evidence of COPD.  If he develops symptoms, dyspnea, increased mucus burden then I would perform pulmonary function testing and consider bronchodilator therapy.

## 2018-12-12 NOTE — Patient Instructions (Signed)
We will plan to repeat your CT to compare with your priors, insure no changes in pulmonary nodules.  You may want to try using loratadine (generic Claritin) 10mg  daily to see if this helps congestion and cough.  Follow with Dr Lamonte Sakai next available after your CT scan to review the results together.

## 2018-12-12 NOTE — Assessment & Plan Note (Signed)
Repeat CT scan of the chest given his tobacco history.  Given the stability, PET scan results I suspect that these are rheumatoid nodules.  I will review his CT with him when available

## 2018-12-12 NOTE — Assessment & Plan Note (Signed)
Continue omeprazole 

## 2018-12-12 NOTE — Assessment & Plan Note (Signed)
Currently untreated.  Discussed possible nonsedating antihistamine with him today.  He will consider this depending on how much cough he is experiencing

## 2018-12-25 ENCOUNTER — Other Ambulatory Visit: Payer: Self-pay

## 2018-12-25 ENCOUNTER — Ambulatory Visit (INDEPENDENT_AMBULATORY_CARE_PROVIDER_SITE_OTHER)
Admission: RE | Admit: 2018-12-25 | Discharge: 2018-12-25 | Disposition: A | Discharge status: Home / Self Care | Payer: Medicare Other | Source: Ambulatory Visit | Attending: Emergency Medicine | Admitting: Emergency Medicine

## 2018-12-25 DIAGNOSIS — R911 Solitary pulmonary nodule: Secondary | ICD-10-CM | POA: Diagnosis not present

## 2018-12-25 DIAGNOSIS — R918 Other nonspecific abnormal finding of lung field: Secondary | ICD-10-CM | POA: Diagnosis not present

## 2019-01-02 ENCOUNTER — Encounter: Payer: Self-pay | Admitting: Emergency Medicine

## 2019-01-02 ENCOUNTER — Ambulatory Visit (INDEPENDENT_AMBULATORY_CARE_PROVIDER_SITE_OTHER): Payer: Medicare Other | Admitting: Emergency Medicine

## 2019-01-02 ENCOUNTER — Other Ambulatory Visit: Payer: Self-pay

## 2019-01-02 DIAGNOSIS — R911 Solitary pulmonary nodule: Secondary | ICD-10-CM | POA: Diagnosis not present

## 2019-01-02 DIAGNOSIS — Z87891 Personal history of nicotine dependence: Secondary | ICD-10-CM | POA: Diagnosis not present

## 2019-01-02 DIAGNOSIS — J301 Allergic rhinitis due to pollen: Secondary | ICD-10-CM | POA: Diagnosis not present

## 2019-01-02 NOTE — Assessment & Plan Note (Signed)
Without any dyspnea.  He is not on any scheduled bronchodilators.  If he develops symptoms then we will evaluate more in-depth for obstructive lung disease.

## 2019-01-02 NOTE — Patient Instructions (Signed)
We will plan to repeat your CT chest in August 2021 to compare with your priors.  Agree with starting loratadine (generic Claritin) Follow with Dr. Lamonte Sakai in 12 months to review your CT chest, or sooner if you have any problems.

## 2019-01-02 NOTE — Assessment & Plan Note (Signed)
Stable nodules going back to July 2018.  We will repeat in August 2021.  If stable at that time we may be able to decrease surveillance.

## 2019-01-02 NOTE — Assessment & Plan Note (Signed)
He obtained some loratadine, is taking them believe that is benefiting.  Plan to continue.

## 2019-01-02 NOTE — Progress Notes (Signed)
Subjective:    Patient ID: William West, male    DOB: June 30, 1950, 68 y.o.   MRN: [id number]  HPI 68 year old man formally followed by Dr. Lenna Gilford in our office.  Has a history of former tobacco, no documented COPD.  PMH atrial fibrillation, hypercholesterolemia, rheumatoid arthritis for which she has been treated with methotrexate remotely, currently weekly Enbrel (Dr. Amil Amen).  He has pulmonary nodular disease that is been followed by CT scan of the chest, 10 mm left upper lobe, 7 mm left lower lobe on most recent CT 09/06/2017.  PET negative 03/08/2017.  He has a chronic cough, sometimes some clear mucous in the am, seems to be worse in the allergy season. He is on omeprazole. Recently treated with empiric doxy after tick bites. Denies any dyspnea, CP. He has had some nasal obstruction with congestion.   ROV 01/02/2019 --this a follow-up visit for 68 year old gentleman, former smoker with COPD.  He also has rheumatoid arthritis, has been on methotrexate in the past and is currently on Enbrel.  He has pulmonary nodule disease that has been followed with serial imaging.  We repeated his CT chest on 12/25/2018 and I have reviewed.  This shows stable left upper lobe and left lower lobe nodules compared with 09/06/2017.     Review of Systems  Past Medical History:  Diagnosis Date  . A-fib ([place])   . Acute blood loss anemia 01/26/2015  . Allergic rhinitis   . Diverticulosis of colon   . Dysplastic nevus   . GERD (gastroesophageal reflux disease)   . GI bleeding 01/24/2015  . Hypercholesterolemia   . Postural dizziness with near syncope due to dehydration 12/09/2016  . RA (rheumatoid arthritis) (HCC)      Family History  Problem Relation Age of Onset  . Prostate cancer Father   . Heart disease Mother   . Rheum arthritis Mother      Social History   Socioeconomic History  . Marital status: Married    Spouse name: Jeani Hawking  . Number of children: 2  . Years of education: Not on file  .  Highest education level: Not on file  Occupational History  . Occupation: [employer]    Comment: RETIRED  Social Needs  . Financial resource strain: Not on file  . Food insecurity    Worry: Not on file    Inability: Not on file  . Transportation needs    Medical: Not on file    Non-medical: Not on file  Tobacco Use  . Smoking status: Former Smoker    Packs/day: 0.50    Years: 45.00    Pack years: 22.50    Types: Cigarettes    Quit date: 11/13/2009    Years since quitting: 9.1  . Smokeless tobacco: Never Used  Substance and Sexual Activity  . Alcohol use: No    Alcohol/week: 0.0 standard drinks  . Drug use: No  . Sexual activity: Never  Lifestyle  . Physical activity    Days per week: Not on file    Minutes per session: Not on file  . Stress: Not on file  Relationships  . Social Herbalist on phone: Not on file    Gets together: Not on file    Attends religious service: Not on file    Active member of club or organization: Not on file    Attends meetings of clubs or organizations: Not on file    Relationship status: Not on file  .  Intimate partner violence    Fear of current or ex partner: Not on file    Emotionally abused: Not on file    Physically abused: Not on file    Forced sexual activity: Not on file  Other Topics Concern  . Not on file  Social History Narrative   One son with HBP. One son with IDDM. Exercises regularly at the [facility]. Eats well. Down 30 pounds with the lifestyle change. Exercise keeps him from feeling so stiff. Weighs daily. Baseline 199-204.      Allergies  Allergen Reactions  . Caffeine Other (See Comments)    Causes him to go into afib  . Flecainide Other (See Comments)    Caused heart to race too fast and had him in the ED  . Methotrexate Derivatives Nausea And Vomiting    Increased liver enzymes  . Pseudoephedrine-Ibuprofen Other (See Comments)    " afib"   . Penicillins Rash    .Marland KitchenHas patient had a PCN reaction causing  immediate rash, facial/tongue/throat swelling, SOB or lightheadedness with hypotension: No, YES TO RASH  Has patient had a PCN reaction causing severe rash involving mucus membranes or skin necrosis: No Has patient had a PCN reaction that required hospitalization No Has patient had a PCN reaction occurring within the last 10 years: No If all of the above answers are "NO", then may proceed with Cephalosporin use.     Outpatient Medications Prior to Visit  Medication Sig Dispense Refill  . Acetaminophen (TYLENOL EXTRA STRENGTH PO) Take by mouth. 2 tabs as needed    . aspirin EC 81 MG tablet Take 81 mg by mouth daily.    [name] diltiazem (CARDIZEM CD) 120 MG 24 hr capsule TAKE 1 CAPSULE (120 MG TOTAL) BY MOUTH DAILY. 90 capsule 3  . etanercept (ENBREL) 50 MG/ML injection Inject 50 mg into the skin once a week. On mondays    . ibuprofen (ADVIL,MOTRIN) 200 MG tablet Take 200 mg by mouth every 8 (eight) hours as needed.    . loratadine (CLARITIN) 10 MG tablet Take 10 mg by mouth daily.    [name] LORazepam (ATIVAN) 1 MG tablet Take 1 tablet (1 mg total) by mouth at bedtime. TAKE 1/2 TO 1 TABLET BY MOUTH 3 TIMES A DAY AS NEEDED 90 tablet 1  . Multiple Vitamin (MULTIVITAMIN) tablet Take 1 tablet by mouth daily.    [name] omeprazole (PRILOSEC) 40 MG capsule Take 40 mg by mouth daily.    . Probiotic Product (PROBIOTIC-10 ULTIMATE PO) Take 1 capsule by mouth daily.    . vitamin C (ASCORBIC ACID) 500 MG tablet Take 500 mg by mouth daily.      No facility-administered medications prior to visit.         Objective:   Physical Exam Vitals:   01/02/19 1043  BP: 120/90  Pulse: 60  SpO2: 97%  Weight: 202 lb (91.6 kg)  Height: 5\' 11"  (1.803 m)   Gen: Pleasant, well-nourished, in no distress,  normal affect  ENT: No lesions,  mouth clear,  oropharynx clear, no postnasal drip  Neck: No JVD, no stridor  Lungs: No use of accessory muscles, no crackles or wheezing on normal respiration, no wheeze on forced  expiration  Cardiovascular: RRR, heart sounds normal, no murmur or gallops, no peripheral edema  Musculoskeletal: No deformities, no cyanosis or clubbing  Neuro: alert, awake, non focal  Skin: Warm, no lesions or rash      Assessment & Plan:  Pulmonary nodule, left  Stable nodules going back to July 2018.  We will repeat in August 2021.  If stable at that time we may be able to decrease surveillance.  Former smoker Without any dyspnea.  He is not on any scheduled bronchodilators.  If he develops symptoms then we will evaluate more in-depth for obstructive lung disease.  Allergic rhinitis He obtained some loratadine, is taking them believe that is benefiting.  Plan to continue.  Baltazar Apo, MD, PhD 01/02/2019, 11:12 AM [facility] [phone] or if no answer [phone]

## 2019-01-14 ENCOUNTER — Encounter: Payer: Self-pay | Admitting: Family Medicine

## 2019-01-15 ENCOUNTER — Telehealth: Payer: Self-pay | Admitting: Family Medicine

## 2019-01-15 DIAGNOSIS — M0579 Rheumatoid arthritis with rheumatoid factor of multiple sites without organ or systems involvement: Secondary | ICD-10-CM | POA: Diagnosis not present

## 2019-01-15 DIAGNOSIS — Z6827 Body mass index (BMI) 27.0-27.9, adult: Secondary | ICD-10-CM | POA: Diagnosis not present

## 2019-01-15 DIAGNOSIS — Z79899 Other long term (current) drug therapy: Secondary | ICD-10-CM | POA: Diagnosis not present

## 2019-01-15 DIAGNOSIS — E663 Overweight: Secondary | ICD-10-CM | POA: Diagnosis not present

## 2019-01-15 NOTE — Telephone Encounter (Signed)
I got pt scheduled and made him aware about Dr Juleen China leaving because he has an appt set in November that she will not be here for so I wanted to see if he wanted to go ahead and put in for a transfer of care, He said he will look online at providers and see who he wants to switch to and call back

## 2019-01-15 NOTE — Telephone Encounter (Signed)
Please put on nurse schedule.

## 2019-01-15 NOTE — Telephone Encounter (Signed)
Pt wanted to know if he can schedule his Flu shot, is this ok to schedule?

## 2019-01-17 ENCOUNTER — Encounter: Payer: Self-pay | Admitting: Family Medicine

## 2019-01-17 DIAGNOSIS — Z23 Encounter for immunization: Secondary | ICD-10-CM | POA: Diagnosis not present

## 2019-01-25 ENCOUNTER — Ambulatory Visit: Payer: Medicare Other

## 2019-01-26 DIAGNOSIS — Z23 Encounter for immunization: Secondary | ICD-10-CM | POA: Diagnosis not present

## 2019-02-06 ENCOUNTER — Encounter: Payer: Self-pay | Admitting: Internal Medicine

## 2019-02-06 ENCOUNTER — Ambulatory Visit (INDEPENDENT_AMBULATORY_CARE_PROVIDER_SITE_OTHER): Payer: Medicare Other | Admitting: Internal Medicine

## 2019-02-06 ENCOUNTER — Other Ambulatory Visit: Payer: Self-pay

## 2019-02-06 VITALS — BP 117/68 | HR 71 | Temp 96.0°F | Wt 205.0 lb

## 2019-02-06 DIAGNOSIS — I480 Paroxysmal atrial fibrillation: Secondary | ICD-10-CM

## 2019-02-06 NOTE — Progress Notes (Signed)
HPI Mr. Bartsch today for follow-up and for evaluation of syncope. He is a pleasant 68 year old man with paroxysmal atrial fibrillation, and relative intolerance to flecainide. The patient has been well-controlled with regard to his atrial fibrillation in the past. He has had episodes of syncope which were thought to be mostly related to autonomic dysfunction. He has had some GI bleeding in the past.  Allergies  Allergen Reactions  . Caffeine Other (See Comments)    Causes him to go into afib  . Flecainide Other (See Comments)    Caused heart to race too fast and had him in the ED  . Methotrexate Derivatives Nausea And Vomiting    Increased liver enzymes  . Pseudoephedrine-Ibuprofen Other (See Comments)    " afib"   . Penicillins Rash    .Marland KitchenHas patient had a PCN reaction causing immediate rash, facial/tongue/throat swelling, SOB or lightheadedness with hypotension: No, YES TO RASH  Has patient had a PCN reaction causing severe rash involving mucus membranes or skin necrosis: No Has patient had a PCN reaction that required hospitalization No Has patient had a PCN reaction occurring within the last 10 years: No If all of the above answers are "NO", then may proceed with Cephalosporin use.     Current Outpatient Medications  Medication Sig Dispense Refill  . Acetaminophen (TYLENOL EXTRA STRENGTH PO) Take by mouth. 2 tabs as needed    . aspirin EC 81 MG tablet Take 81 mg by mouth daily.    [name] diltiazem (CARDIZEM CD) 120 MG 24 hr capsule TAKE 1 CAPSULE (120 MG TOTAL) BY MOUTH DAILY. 90 capsule 3  . etanercept (ENBREL) 50 MG/ML injection Inject 50 mg into the skin once a week. On mondays    . ibuprofen (ADVIL,MOTRIN) 200 MG tablet Take 200 mg by mouth every 8 (eight) hours as needed.    . loratadine (CLARITIN) 10 MG tablet Take 10 mg by mouth daily.    [name] LORazepam (ATIVAN) 1 MG tablet Take 1 tablet (1 mg total) by mouth at bedtime. TAKE 1/2 TO 1 TABLET BY MOUTH 3 TIMES A DAY  AS NEEDED 90 tablet 1  . Multiple Vitamin (MULTIVITAMIN) tablet Take 1 tablet by mouth daily.    [name] omeprazole (PRILOSEC) 40 MG capsule Take 40 mg by mouth daily.    . Probiotic Product (PROBIOTIC-10 ULTIMATE PO) Take 1 capsule by mouth daily.    . vitamin C (ASCORBIC ACID) 500 MG tablet Take 500 mg by mouth daily.      No current facility-administered medications for this visit.      Past Medical History:  Diagnosis Date  . A-fib ([place])   . Acute blood loss anemia 01/26/2015  . Allergic rhinitis   . Diverticulosis of colon   . Dysplastic nevus   . GERD (gastroesophageal reflux disease)   . GI bleeding 01/24/2015  . Hypercholesterolemia   . Postural dizziness with near syncope due to dehydration 12/09/2016  . RA (rheumatoid arthritis) (HCC)     ROS:   All systems reviewed and negative except as noted in the HPI.   Past Surgical History:  Procedure Laterality Date  . COLONOSCOPY  01/24/2015   x 3  . ERCP N/A 02/18/2015   Procedure: ENDOSCOPIC RETROGRADE CHOLANGIOPANCREATOGRAPHY (ERCP);  Surgeon: Carol Ada, MD;  Location: [facility];  Service: Endoscopy;  Laterality: N/A;  . GIVENS CAPSULE STUDY N/A 01/24/2015   Procedure: GIVENS CAPSULE STUDY;  Surgeon: Juanita Craver, MD;  Location: [facility];  Service:  Endoscopy;  Laterality: N/A;  . LAPAROSCOPIC CHOLECYSTECTOMY  2011  . TOE AMPUTATION  1966  . TONSILLECTOMY AND ADENOIDECTOMY       Family History  Problem Relation Age of Onset  . Prostate cancer Father   . Heart disease Mother   . Rheum arthritis Mother      Social History   Socioeconomic History  . Marital status: Married    Spouse name: Jeani Hawking  . Number of children: 2  . Years of education: Not on file  . Highest education level: Not on file  Occupational History  . Occupation: [employer]    Comment: RETIRED  Social Needs  . Financial resource strain: Not on file  . Food insecurity    Worry: Not on file    Inability: Not on file  . Transportation needs     Medical: Not on file    Non-medical: Not on file  Tobacco Use  . Smoking status: Former Smoker    Packs/day: 0.50    Years: 45.00    Pack years: 22.50    Types: Cigarettes    Quit date: 11/13/2009    Years since quitting: 9.2  . Smokeless tobacco: Never Used  Substance and Sexual Activity  . Alcohol use: No    Alcohol/week: 0.0 standard drinks  . Drug use: No  . Sexual activity: Never  Lifestyle  . Physical activity    Days per week: Not on file    Minutes per session: Not on file  . Stress: Not on file  Relationships  . Social Herbalist on phone: Not on file    Gets together: Not on file    Attends religious service: Not on file    Active member of club or organization: Not on file    Attends meetings of clubs or organizations: Not on file    Relationship status: Not on file  . Intimate partner violence    Fear of current or ex partner: Not on file    Emotionally abused: Not on file    Physically abused: Not on file    Forced sexual activity: Not on file  Other Topics Concern  . Not on file  Social History Narrative   One son with HBP. One son with IDDM. Exercises regularly at the [facility]. Eats well. Down 30 pounds with the lifestyle change. Exercise keeps him from feeling so stiff. Weighs daily. Baseline 199-204.      BP 117/68   Pulse 71   Temp (!) 96 F (35.6 C) (Temporal)   Wt 205 lb (93 kg)   SpO2 98%   BMI 28.59 kg/m   Physical Exam:  Well appearing 68 yo man, NAD HEENT: Unremarkable Neck:  No JVD, no thyromegally Lymphatics:  No adenopathy Back:  No CVA tenderness Lungs:  Clear with no wheezes HEART:  Regular rate rhythm, no murmurs, no rubs, no clicks Abd:  soft, positive bowel sounds, no organomegally, no rebound, no guarding Ext:  2 plus pulses, no edema, no cyanosis, no clubbing Skin:  No rashes no nodules Neuro:  CN II through XII intact, motor grossly intact  EKG - nsr   Assess/Plan: 1. PAF - he has had a single episode in the  past year. I encouraged him to continue watchful waiting. He will continue his cardizem. 2. Syncope - he has had none since his last visit.  3. Remote Gi bleeding - none in the past.   Mikle Bosworth.D.

## 2019-02-06 NOTE — Patient Instructions (Signed)
Medication Instructions: Your physician recommends that you continue on your current medications as directed. Please refer to the Current Medication list given to you today.   Labwork: none  Procedures/Testing: none  Follow-Up: 1 year with Dr.Taylor  Any Additional Special Instructions Will Be Listed Below (If Applicable).     If you need a refill on your cardiac medications before your next appointment, please call your pharmacy.   

## 2019-02-08 DIAGNOSIS — L814 Other melanin hyperpigmentation: Secondary | ICD-10-CM | POA: Diagnosis not present

## 2019-02-08 DIAGNOSIS — D1801 Hemangioma of skin and subcutaneous tissue: Secondary | ICD-10-CM | POA: Diagnosis not present

## 2019-02-08 DIAGNOSIS — L720 Epidermal cyst: Secondary | ICD-10-CM | POA: Diagnosis not present

## 2019-02-08 DIAGNOSIS — L821 Other seborrheic keratosis: Secondary | ICD-10-CM | POA: Diagnosis not present

## 2019-02-08 DIAGNOSIS — D225 Melanocytic nevi of trunk: Secondary | ICD-10-CM | POA: Diagnosis not present

## 2019-03-06 IMAGING — PT NM PET TUM IMG INITIAL (PI) SKULL BASE T - THIGH
1 of 8 series · 2 of 25 positions shown · non-contrast
Comparison: 02/24/2017 chest CT. 12/02/2016 CT chest, abdomen and
pelvis.

CLINICAL DATA: Initial treatment strategy for left upper and lower
lobe pulmonary nodules.

EXAM:
NUCLEAR MEDICINE PET SKULL BASE TO THIGH
TECHNIQUE: 10.8 mCi F-18 FDG was injected intravenously. Full-ring PET imaging
was performed from the skull base to thigh after the radiotracer. CT
data was obtained and used for attenuation correction and anatomic
localization.
FASTING BLOOD GLUCOSE:  Value: 100 mg/dl

[Series 4: ct sk_thigh 5.0 b31f · axial · 5.0mm · 0.98mm/px · z∈[-732,-488]mm · 2 of 244 slices shown]
[im 61/244  brain]
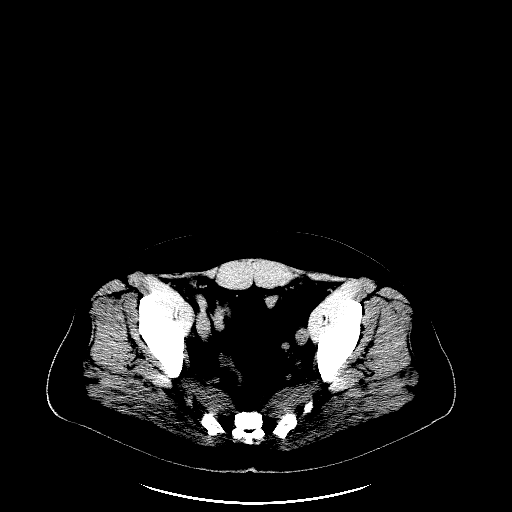
[im 122/244  brain]
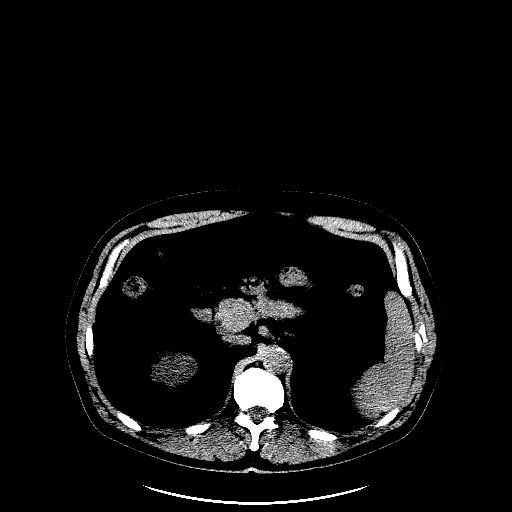

[2 of 25 positions shown; findings below may reference images not displayed]

FINDINGS: NECK: No hypermetabolic lymph nodes in the neck.

CHEST:

Mildly thick walled cystic 1.0 cm lingular lesion (series 8/image
31) demonstrates no significant metabolism, with max SUV 1.4, stable
in size since 12/02/2016 chest CT.

Solid 0.9 cm superior segment left lower lobe pulmonary nodule
(series 8/image 28) demonstrates no significant metabolism with max
SUV 1.6, stable in size since 12/02/2016 chest CT.

No enlarged or hypermetabolic axillary, mediastinal or hilar lymph
nodes. Coronary atherosclerosis. Atherosclerotic nonaneurysmal
thoracic aorta. No pneumothorax. No pleural effusions. No acute
consolidative airspace disease.

ABDOMEN/PELVIS:

There is a solitary hypermetabolic mildly enlarged 1.5 cm right
mesenteric lymph node (series 4/image 148), which is newly enlarged
since 12/02/2016 CT study. No additional enlarged or hypermetabolic
abdominopelvic lymph nodes.

No abnormal hypermetabolic activity within the liver, pancreas,
adrenal glands, or spleen. Mild diffuse hepatic steatosis.
subcentimeter hypodense left liver lobe lesion is below PET
resolution and stable since 01/25/2015, considered benign.
Cholecystectomy. Atherosclerotic nonaneurysmal abdominal aorta.
Marked sigmoid diverticulosis, with no large bowel wall thickening
or pericolonic fat stranding. Small bilateral hydroceles. Asymmetric
fat in the left inguinal canal, unchanged.

SKELETON: No focal hypermetabolic activity to suggest skeletal
metastasis. There is mild hypermetabolism at the sites of the
incompletely healed lateral left eighth, ninth, tenth and eleventh
rib fractures.
IMPRESSION: 1. No significant metabolism associated with the 1.0 cm mildly
thick-walled cystic lingular lesion or associated with the solid
cm superior segment left lower lobe pulmonary nodule, suggestive of
benign etiology. Continued chest CT surveillance is recommended in
6-12 months.
2. Nonspecific hypermetabolic mildly enlarged right mesenteric lymph
node. Given that this solitary lymph node is newly enlarged since
12/02/2016 CT study, a reactive etiology seems more likely, although
neoplasm is not entirely excluded. Follow-up CT abdomen/pelvis with
oral and IV contrast is suggested in 3-6 months.
3. Mild hypermetabolism at the sites of the incompletely healed
lateral left eighth, ninth, tenth and eleventh rib fractures.
4. Chronic findings include: Aortic Atherosclerosis ([id number]).
Coronary atherosclerosis. Marked sigmoid diverticulosis.

## 2019-04-02 ENCOUNTER — Encounter: Payer: Medicare Other | Admitting: Family Medicine

## 2019-04-03 ENCOUNTER — Other Ambulatory Visit: Payer: Self-pay

## 2019-04-03 ENCOUNTER — Telehealth: Payer: Self-pay | Admitting: Family Medicine

## 2019-04-03 ENCOUNTER — Ambulatory Visit (INDEPENDENT_AMBULATORY_CARE_PROVIDER_SITE_OTHER): Payer: Medicare Other | Admitting: Family Medicine

## 2019-04-03 ENCOUNTER — Encounter: Payer: Self-pay | Admitting: Family Medicine

## 2019-04-03 VITALS — BP 124/80 | HR 60 | Temp 97.9°F | Ht 71.0 in | Wt 202.5 lb

## 2019-04-03 DIAGNOSIS — J301 Allergic rhinitis due to pollen: Secondary | ICD-10-CM | POA: Diagnosis not present

## 2019-04-03 DIAGNOSIS — E785 Hyperlipidemia, unspecified: Secondary | ICD-10-CM | POA: Diagnosis not present

## 2019-04-03 DIAGNOSIS — F5101 Primary insomnia: Secondary | ICD-10-CM | POA: Diagnosis not present

## 2019-04-03 DIAGNOSIS — Z125 Encounter for screening for malignant neoplasm of prostate: Secondary | ICD-10-CM

## 2019-04-03 DIAGNOSIS — M069 Rheumatoid arthritis, unspecified: Secondary | ICD-10-CM

## 2019-04-03 DIAGNOSIS — R3911 Hesitancy of micturition: Secondary | ICD-10-CM

## 2019-04-03 DIAGNOSIS — K219 Gastro-esophageal reflux disease without esophagitis: Secondary | ICD-10-CM | POA: Diagnosis not present

## 2019-04-03 DIAGNOSIS — R399 Unspecified symptoms and signs involving the genitourinary system: Secondary | ICD-10-CM | POA: Diagnosis not present

## 2019-04-03 DIAGNOSIS — I480 Paroxysmal atrial fibrillation: Secondary | ICD-10-CM

## 2019-04-03 DIAGNOSIS — Z8042 Family history of malignant neoplasm of prostate: Secondary | ICD-10-CM | POA: Diagnosis not present

## 2019-04-03 DIAGNOSIS — Z0000 Encounter for general adult medical examination without abnormal findings: Secondary | ICD-10-CM

## 2019-04-03 DIAGNOSIS — Z1159 Encounter for screening for other viral diseases: Secondary | ICD-10-CM

## 2019-04-03 LAB — COMPREHENSIVE METABOLIC PANEL
ALT: 18 U/L (ref 0–53)
AST: 20 U/L (ref 0–37)
Albumin: 4.5 g/dL (ref 3.5–5.2)
Alkaline Phosphatase: 63 U/L (ref 39–117)
BUN: 15 mg/dL (ref 6–23)
CO2: 29 mEq/L (ref 19–32)
Calcium: 9.5 mg/dL (ref 8.4–10.5)
Chloride: 104 mEq/L (ref 96–112)
Creatinine, Ser: 1.08 mg/dL (ref 0.40–1.50)
GFR: 68.05 mL/min (ref >60.00)
Glucose, Bld: 101 mg/dL — ABNORMAL HIGH (ref 70–99)
Potassium: 4.5 mEq/L (ref 3.5–5.1)
Sodium: 139 mEq/L (ref 135–145)
Total Bilirubin: 0.8 mg/dL (ref 0.2–1.2)
Total Protein: 6.8 g/dL (ref 6.0–8.3)

## 2019-04-03 LAB — URINALYSIS, ROUTINE W REFLEX MICROSCOPIC
Bilirubin Urine: NEGATIVE
Hgb urine dipstick: NEGATIVE
Ketones, ur: NEGATIVE
Leukocytes,Ua: NEGATIVE
Nitrite: NEGATIVE
RBC / HPF: NONE SEEN (ref 0–?)
Specific Gravity, Urine: 1.015 (ref 1.000–1.030)
Total Protein, Urine: NEGATIVE
Urine Glucose: NEGATIVE
Urobilinogen, UA: 0.2 (ref 0.0–1.0)
pH: 7.5 (ref 5.0–8.0)

## 2019-04-03 LAB — CBC
HCT: 43 % (ref 39.0–52.0)
Hemoglobin: 14.8 g/dL (ref 13.0–17.0)
MCHC: 34.4 g/dL (ref 30.0–36.0)
MCV: 86.5 fl (ref 78.0–100.0)
Platelets: 240 10*3/uL (ref 150.0–400.0)
RBC: 4.97 Mil/uL (ref 4.22–5.81)
RDW: 14.1 % (ref 11.5–15.5)
WBC: 6.1 10*3/uL (ref 4.0–10.5)

## 2019-04-03 LAB — LIPID PANEL
Cholesterol: 146 mg/dL (ref 0–200)
HDL: 52.5 mg/dL (ref >39.00)
LDL Cholesterol: 77 mg/dL (ref 0–99)
NonHDL: 93.88
Total CHOL/HDL Ratio: 3
Triglycerides: 83 mg/dL (ref 0.0–149.0)
VLDL: 16.6 mg/dL (ref 0.0–40.0)

## 2019-04-03 LAB — TSH: TSH: 2.39 u[IU]/mL (ref 0.35–4.50)

## 2019-04-03 LAB — PSA, MEDICARE: PSA: 1.28 ng/ml (ref 0.10–4.00)

## 2019-04-03 MED ORDER — LORAZEPAM 1 MG PO TABS: 1.0000 mg | ORAL_TABLET | Freq: Three times a day (TID) | ORAL | 1 refills | Status: DC | PRN

## 2019-04-03 MED ORDER — LORAZEPAM 1 MG PO TABS: 1.0000 mg | ORAL_TABLET | Freq: Every day | ORAL | 1 refills | Status: DC

## 2019-04-03 NOTE — Patient Instructions (Signed)
It was very nice to see you today!  We will check blood work and urine sample.  I will refill your medication.  Please come back to see me in 1 year for your next checkup, or sooner if needed.  Take care, Dr [name]  Please try these tips to maintain a healthy lifestyle:   Eat at least 3 REAL meals and 1-2 snacks per day.  Aim for no more than 5 hours between eating.  If you eat breakfast, please do so within one hour of getting up.    Obtain twice as many fruits/vegetables as protein or carbohydrate foods for both lunch and dinner. (Half of each meal should be fruits/vegetables, one quarter protein, and one quarter starchy carbs)   Cut down on sweet beverages. This includes juice, soda, and sweet tea.    Exercise at least 150 minutes every week.    Preventive Care 67 Years and Older, William West Preventive care refers to lifestyle choices and visits with your health care provider that can promote health and wellness. This includes:  A yearly physical exam. This is also called an annual well check.  Regular dental and eye exams.  Immunizations.  Screening for certain conditions.  Healthy lifestyle choices, such as diet and exercise. What can I expect for my preventive care visit? Physical exam Your health care provider will check:  Height and weight. These may be used to calculate body mass index (BMI), which is a measurement that tells if you are at a healthy weight.  Heart rate and blood pressure.  Your skin for abnormal spots. Counseling Your health care provider may ask you questions about:  Alcohol, tobacco, and drug use.  Emotional well-being.  Home and relationship well-being.  Sexual activity.  Eating habits.  History of falls.  Memory and ability to understand (cognition).  Work and work Statistician. What immunizations do I need?  Influenza (flu) vaccine  This is recommended every year. Tetanus, diphtheria, and pertussis (Tdap) vaccine  You may  need a Td booster every 10 years. Varicella (chickenpox) vaccine  You may need this vaccine if you have not already been vaccinated. Zoster (shingles) vaccine  You may need this after age 96. Pneumococcal conjugate (PCV13) vaccine  One dose is recommended after age 62. Pneumococcal polysaccharide (PPSV23) vaccine  One dose is recommended after age 3. Measles, mumps, and rubella (MMR) vaccine  You may need at least one dose of MMR if you were born in 1957 or later. You may also need a second dose. Meningococcal conjugate (MenACWY) vaccine  You may need this if you have certain conditions. Hepatitis A vaccine  You may need this if you have certain conditions or if you travel or work in places where you may be exposed to hepatitis A. Hepatitis B vaccine  You may need this if you have certain conditions or if you travel or work in places where you may be exposed to hepatitis B. Haemophilus influenzae type b (Hib) vaccine  You may need this if you have certain conditions. You may receive vaccines as individual doses or as more than one vaccine together in one shot (combination vaccines). Talk with your health care provider about the risks and benefits of combination vaccines. What tests do I need? Blood tests  Lipid and cholesterol levels. These may be checked every 5 years, or more frequently depending on your overall health.  Hepatitis C test.  Hepatitis B test. Screening  Lung cancer screening. You may have this screening every year  starting at age 62 if you have a 30-pack-year history of smoking and currently smoke or have quit within the past 15 years.  Colorectal cancer screening. All adults should have this screening starting at age 82 and continuing until age 16. Your health care provider may recommend screening at age 40 if you are at increased risk. You will have tests every 1-10 years, depending on your results and the type of screening test.  Prostate cancer  screening. Recommendations will vary depending on your family history and other risks.  Diabetes screening. This is done by checking your blood sugar (glucose) after you have not eaten for a while (fasting). You may have this done every 1-3 years.  Abdominal aortic aneurysm (AAA) screening. You may need this if you are a current or former smoker.  Sexually transmitted disease (STD) testing. Follow these instructions at home: Eating and drinking  Eat a diet that includes fresh fruits and vegetables, whole grains, lean protein, and low-fat dairy products. Limit your intake of foods with high amounts of sugar, saturated fats, and salt.  Take vitamin and mineral supplements as recommended by your health care provider.  Do not drink alcohol if your health care provider tells you not to drink.  If you drink alcohol: ? Limit how much you have to 0-2 drinks a day. ? Be aware of how much alcohol is in your drink. In the [place] one drink equals one 12 oz bottle of beer (355 mL), one 5 oz glass of wine (148 mL), or one 1 oz glass of hard liquor (44 mL). Lifestyle  Take daily care of your teeth and gums.  Stay active. Exercise for at least 30 minutes on 5 or more days each week.  Do not use any products that contain nicotine or tobacco, such as cigarettes, e-cigarettes, and chewing tobacco. If you need help quitting, ask your health care provider.  If you are sexually active, practice safe sex. Use a condom or other form of protection to prevent STIs (sexually transmitted infections).  Talk with your health care provider about taking a low-dose aspirin or statin. What's next?  Visit your health care provider once a year for a well check visit.  Ask your health care provider how often you should have your eyes and teeth checked.  Stay up to date on all vaccines. This information is not intended to replace advice given to you by your health care provider. Make sure you discuss any questions  you have with your health care provider. Document Released: 06/06/2015 Document Revised: 05/04/2018 Document Reviewed: 05/04/2018 Elsevier Patient Education  2020 [employer].

## 2019-04-03 NOTE — Assessment & Plan Note (Signed)
Continue management per cardiology. 

## 2019-04-03 NOTE — Telephone Encounter (Signed)
New rx sent in.  Algis Greenhouse. [name], MD 04/03/2019 2:46 PM

## 2019-04-03 NOTE — Assessment & Plan Note (Signed)
Stable.  Continue Prilosec per GI.

## 2019-04-03 NOTE — Assessment & Plan Note (Signed)
Stable  Continue Claritin

## 2019-04-03 NOTE — Assessment & Plan Note (Signed)
Check yearly PSA today.

## 2019-04-03 NOTE — Assessment & Plan Note (Signed)
Lorazepam refilled

## 2019-04-03 NOTE — Telephone Encounter (Signed)
Pt states that [facility] has increased their pricing and pt can't afford medication there anymore. Pt would like to have his LORazepam (ATIVAN) 1 MG tablet called in for 90 days at  [place], [place] - [address] [place] (Phone) [phone] (Fax)

## 2019-04-03 NOTE — Telephone Encounter (Signed)
See below

## 2019-04-03 NOTE — Assessment & Plan Note (Signed)
Check lipid panel  

## 2019-04-03 NOTE — Telephone Encounter (Signed)
Patient needs Rx sent as 30 day supply taking 1 tablet 3 times daily PRN. Please advise

## 2019-04-03 NOTE — Assessment & Plan Note (Signed)
Continue management per rheumatology. 

## 2019-04-03 NOTE — Progress Notes (Signed)
Chief Complaint:  William West is a 68 y.o. male who presents today for a subsequent Medicare Annual Wellness Visit and to discuss management of his chronic medical problems.  Assessment/Plan:  Urinary hesitancy Likely BPH.  No current symptoms.  Will check UA.  Consider trial of Flomax if continues to be an issue.  Allergic rhinitis Stable.  Continue Claritin.  Dyslipidemia Check lipid panel.  Atrial fibrillation ([place]), on Cardizem daily, with prn ASA and Diltiazem Continue management per cardiology.  Family history of prostate cancer Check yearly PSA today.  Rheumatoid arthritis ([place]), on Enbrel, followed by Dr. Amil Amen Continue management per rheumatology.  Insomnia Lorazepam refilled.  GERD (gastroesophageal reflux disease), on Omeprazole Stable.  Continue Prilosec per GI.  Preventative Healthcare Check PSA.  Check hepatitis C antibody.  Up-to-date on flu vaccine and pneumonia vaccines.  Due for colonoscopy in 2023.  During the course of the visit the patient was educated and counseled about appropriate screening and preventive services including:        Fall prevention   Nutrition Physical Activity Weight Management Cognition    Subjective:  HPI:  Health Risk Assessment: Patient considers his overall health to be fair. He has no difficulty performing the following: . Preparing food and eating . Bathing  . Getting dressed . Using the toilet . Shopping . Managing Finances . Moving around from place to place  He has not had any falls within the past year.   Depression screen PHQ 2/9 06/26/2018  Decreased Interest 0  Down, Depressed, Hopeless 0  PHQ - 2 Score 0   Lifestyle Factors: Diet: No specific diets or eating plans.  Exercise: Likes to go to the [facility] and exercise regularly.   Patient Care Team: [name], MD as PCP - General (Family Medicine) Hennie Duos, MD as Consulting Physician (Rheumatology) Juanita Craver, MD as  Consulting Physician (Gastroenterology)   He has no acute complaints today.   He had an episode of urinary hesitancy a few days ago.  This resolved spontaneously.  Wakes up 2-3 times at night to urinate.  No noted hematuria.  No dysuria.  No fevers or chills.  No reported nausea or vomiting.  His chronic medical conditions are outlined below:  # Atrial Fibrillation - Follows with cardiology - Dr Lovena Le - Rate controlled with cardizem 120mg  daily - ROS: No reported chest pain or shortness of breath  # Rheumatoid Arthritis - Follows with Rheumatology-Dr. Amil Amen - On Enbrel 50mg  weekly  # Insomnia - Uses Ativan as needed at bedtime  # Allergic Rhinitis - Uses claritin 10mg  daily as needed  #GERD - Follows with GI - Dr Collene Mares - On omeprazole 40 mg daily - ROS: No reported unintentional weight loss or early satiety   ROS: Per HPI  PMH:  The following were reviewed and entered/updated in epic: Past Medical History:  Diagnosis Date  . A-fib ([place])   . Acute blood loss anemia 01/26/2015  . Allergic rhinitis   . Diverticulosis of colon   . Dysplastic nevus   . GERD (gastroesophageal reflux disease)   . GI bleeding 01/24/2015  . Hypercholesterolemia   . Postural dizziness with near syncope due to dehydration 12/09/2016  . RA (rheumatoid arthritis) ([place])    Past Surgical History:  Procedure Laterality Date  . COLONOSCOPY  01/24/2015   x 3  . ERCP N/A 02/18/2015   Procedure: ENDOSCOPIC RETROGRADE CHOLANGIOPANCREATOGRAPHY (ERCP);  Surgeon: Carol Ada, MD;  Location: [facility];  Service: Endoscopy;  Laterality: N/A;  . GIVENS CAPSULE STUDY N/A 01/24/2015   Procedure: GIVENS CAPSULE STUDY;  Surgeon: Juanita Craver, MD;  Location: [facility];  Service: Endoscopy;  Laterality: N/A;  . LAPAROSCOPIC CHOLECYSTECTOMY  2011  . TOE AMPUTATION  1966  . TONSILLECTOMY AND ADENOIDECTOMY     Family History  Problem Relation Age of Onset  . Prostate cancer Father   . Heart disease Mother    . Rheum arthritis Mother     Medications- reviewed and updated Current Outpatient Medications  Medication Sig Dispense Refill  . Acetaminophen (TYLENOL EXTRA STRENGTH PO) Take by mouth. 2 tabs as needed    . aspirin EC 81 MG tablet Take 81 mg by mouth daily.    [name] diltiazem (CARDIZEM CD) 120 MG 24 hr capsule TAKE 1 CAPSULE (120 MG TOTAL) BY MOUTH DAILY. 90 capsule 3  . etanercept (ENBREL) 50 MG/ML injection Inject 50 mg into the skin once a week. On mondays    . ibuprofen (ADVIL,MOTRIN) 200 MG tablet Take 200 mg by mouth every 8 (eight) hours as needed.    . loratadine (CLARITIN) 10 MG tablet Take 10 mg by mouth daily.    [name] LORazepam (ATIVAN) 1 MG tablet Take 1 tablet (1 mg total) by mouth at bedtime. 90 tablet 1  . Multiple Vitamin (MULTIVITAMIN) tablet Take 1 tablet by mouth daily.    [name] omeprazole (PRILOSEC) 40 MG capsule Take 40 mg by mouth daily.    . Probiotic Product (PROBIOTIC-10 ULTIMATE PO) Take 1 capsule by mouth daily.    . vitamin C (ASCORBIC ACID) 500 MG tablet Take 500 mg by mouth daily.      No current facility-administered medications for this visit.     Allergies-reviewed and updated Allergies  Allergen Reactions  . Caffeine Other (See Comments)    Causes him to go into afib  . Flecainide Other (See Comments)    Caused heart to race too fast and had him in the ED  . Methotrexate Derivatives Nausea And Vomiting    Increased liver enzymes  . Pseudoephedrine-Ibuprofen Other (See Comments)    " afib"   . Penicillins Rash    .Marland KitchenHas patient had a PCN reaction causing immediate rash, facial/tongue/throat swelling, SOB or lightheadedness with hypotension: No, YES TO RASH  Has patient had a PCN reaction causing severe rash involving mucus membranes or skin necrosis: No Has patient had a PCN reaction that required hospitalization No Has patient had a PCN reaction occurring within the last 10 years: No If all of the above answers are "NO", then may proceed with  Cephalosporin use.    Social History   Socioeconomic History  . Marital status: Married    Spouse name: Jeani Hawking  . Number of children: 2  . Years of education: Not on file  . Highest education level: Not on file  Occupational History  . Occupation: [employer]    Comment: RETIRED  Social Needs  . Financial resource strain: Not on file  . Food insecurity    Worry: Not on file    Inability: Not on file  . Transportation needs    Medical: Not on file    Non-medical: Not on file  Tobacco Use  . Smoking status: Former Smoker    Packs/day: 0.50    Years: 45.00    Pack years: 22.50    Types: Cigarettes    Quit date: 11/13/2009    Years since quitting: 9.3  . Smokeless tobacco: Never Used  Substance  and Sexual Activity  . Alcohol use: No    Alcohol/week: 0.0 standard drinks  . Drug use: No  . Sexual activity: Never  Lifestyle  . Physical activity    Days per week: Not on file    Minutes per session: Not on file  . Stress: Not on file  Relationships  . Social Herbalist on phone: Not on file    Gets together: Not on file    Attends religious service: Not on file    Active member of club or organization: Not on file    Attends meetings of clubs or organizations: Not on file    Relationship status: Not on file  Other Topics Concern  . Not on file  Social History Narrative   One son with HBP. One son with IDDM. Exercises regularly at the [facility]. Eats well. Down 30 pounds with the lifestyle change. Exercise keeps him from feeling so stiff. Weighs daily. Baseline 199-204.          Objective/Observations  Physical Exam: BP 124/80   Pulse 60   Temp 97.9 F (36.6 C)   Ht 5\' 11"  (1.803 m)   Wt 202 lb 8 oz (91.9 kg)   SpO2 99%   BMI 28.24 kg/m  Wt Readings from Last 3 Encounters:  04/03/19 202 lb 8 oz (91.9 kg)  02/06/19 205 lb (93 kg)  01/02/19 202 lb (91.6 kg)  Gen: NAD, resting comfortably HEENT: TMs normal bilaterally. OP clear. No thyromegaly noted.  CV: RRR  with no murmurs appreciated Pulm: NWOB, CTAB with no crackles, wheezes, or rhonchi GI: Normal bowel sounds present. Soft, Nontender, Nondistended. MSK: no edema, cyanosis, or clubbing noted Skin: warm, dry Neuro: CN2-12 grossly intact. Strength 5/5 in upper and lower extremities. Reflexes symmetric and intact bilaterally. Normal minicog.  Psych: Normal affect and thought content  No results found for this or any previous visit (from the past 24 hour(s)).      Algis Greenhouse. [name], MD 04/03/2019 10:14 AM

## 2019-04-04 LAB — HEPATITIS C ANTIBODY
Hepatitis C Ab: NONREACTIVE
SIGNAL TO CUT-OFF: 0.02 (ref <1.00)

## 2019-04-05 ENCOUNTER — Encounter: Payer: Self-pay | Admitting: Family Medicine

## 2019-04-05 DIAGNOSIS — R739 Hyperglycemia, unspecified: Secondary | ICD-10-CM | POA: Insufficient documentation

## 2019-04-05 NOTE — Progress Notes (Signed)
Please inform patient of the following:  Blood sugar was borderline elevated but everything else was NORMAL. Recommend that he continue working on diet and exercise and we can recheck everything in a year or so.

## 2019-04-18 ENCOUNTER — Other Ambulatory Visit: Payer: Self-pay

## 2019-04-26 DIAGNOSIS — K5730 Diverticulosis of large intestine without perforation or abscess without bleeding: Secondary | ICD-10-CM | POA: Diagnosis not present

## 2019-04-26 DIAGNOSIS — K219 Gastro-esophageal reflux disease without esophagitis: Secondary | ICD-10-CM | POA: Diagnosis not present

## 2019-06-04 DIAGNOSIS — L02215 Cutaneous abscess of perineum: Secondary | ICD-10-CM | POA: Diagnosis not present

## 2019-06-21 ENCOUNTER — Encounter: Payer: Self-pay | Admitting: Family Medicine

## 2019-06-21 ENCOUNTER — Ambulatory Visit (INDEPENDENT_AMBULATORY_CARE_PROVIDER_SITE_OTHER): Payer: Medicare Other | Admitting: Family Medicine

## 2019-06-21 VITALS — BP 122/70 | HR 68 | Temp 97.6°F | Ht 71.0 in | Wt 206.4 lb

## 2019-06-21 DIAGNOSIS — K4090 Unilateral inguinal hernia, without obstruction or gangrene, not specified as recurrent: Secondary | ICD-10-CM | POA: Diagnosis not present

## 2019-06-21 NOTE — Progress Notes (Signed)
   William West is a 69 y.o. male who presents today for an office visit.  Assessment/Plan:  New/Acute Problems: Inguinal Hernia  No red flags. Discussed typical course of illness and warning signs for incarcerated hernia. Offered referral to surgery however patient declined. He will continue using his compression boxers. He will follow up as needed.   Preventative Healthcare Discussed covid vaccine. Currently on waiting list.     Subjective:  HPI:  Groin pain in right groin started a few months ago. Tried using heat and ice with no significant improvement. Compression boxers have helped. Worse with activity. Worried about possible hernia. Thinks he could have strained it while working in the yard around his house.        Objective:  Physical Exam: BP 122/70   Pulse 68   Temp 97.6 F (36.4 C)   Ht 5\' 11"  (1.803 m)   Wt 206 lb 6.1 oz (93.6 kg)   SpO2 98%   BMI 28.78 kg/m   Gen: No acute distress, resting comfortably CV: Regular rate and rhythm with no murmurs appreciated Pulm: Normal work of breathing, clear to auscultation bilaterally with no crackles, wheezes, or rhonchi GU: Bulge noted in right inguinal area with valsalva Neuro: Grossly normal, moves all extremities Psych: Normal affect and thought content  Time Spent: 45 minutes of total time was spent on the date of the encounter performing the following actions: chart review prior to seeing the patient, obtaining history, performing a medically necessary exam, counseling on the treatment plan, placing orders, and documenting in our EHR.        Algis Greenhouse. [name], MD 06/21/2019 1:47 PM

## 2019-06-21 NOTE — Patient Instructions (Signed)
It was very nice to see you today!  You have a hernia. Please continue using your compression boxers.  Let me know if you would like a referral to  See the surgeon.  Take care, Dr [name]  Please try these tips to maintain a healthy lifestyle:   Eat at least 3 REAL meals and 1-2 snacks per day.  Aim for no more than 5 hours between eating.  If you eat breakfast, please do so within one hour of getting up.    Each meal should contain half fruits/vegetables, one quarter protein, and one quarter carbs (no bigger than a computer mouse)   Cut down on sweet beverages. This includes juice, soda, and sweet tea.     Drink at least 1 glass of water with each meal and aim for at least 8 glasses per day   Exercise at least 150 minutes every week.    Inguinal Hernia, Adult An inguinal hernia is when fat or your intestines push through a weak spot in a muscle where your leg meets your lower belly (groin). This causes a rounded lump (bulge). This kind of hernia could also be:  In your scrotum, if you are male.  In folds of skin around your vagina, if you are male. There are three types of inguinal hernias. These include:  Hernias that can be pushed back into the belly (are reducible). This type rarely causes pain.  Hernias that cannot be pushed back into the belly (are incarcerated).  Hernias that cannot be pushed back into the belly and lose their blood supply (are strangulated). This type needs emergency surgery. If you do not have symptoms, you may not need treatment. If you have symptoms or a large hernia, you may need surgery. Follow these instructions at home: Lifestyle  Do these things if told by your doctor so you do not have trouble pooping (constipation): ? Drink enough fluid to keep your pee (urine) pale yellow. ? Eat foods that have a lot of fiber. These include fresh fruits and vegetables, whole grains, and beans. ? Limit foods that are high in fat and processed sugars.  These include foods that are fried or sweet. ? Take medicine for trouble pooping.  Avoid lifting heavy objects.  Avoid standing for long amounts of time.  Do not use any products that contain nicotine or tobacco. These include cigarettes and e-cigarettes. If you need help quitting, ask your doctor.  Stay at a healthy weight. General instructions  You may try to push your hernia in by very gently pressing on it when you are lying down. Do not try to force the bulge back in if it will not push in easily.  Watch your hernia for any changes in shape, size, or color. Tell your doctor if you see any changes.  Take over-the-counter and prescription medicines only as told by your doctor.  Keep all follow-up visits as told by your doctor. This is important. Contact a doctor if:  You have a fever.  You have new symptoms.  Your symptoms get worse. Get help right away if:  The area where your leg meets your lower belly has: ? Pain that gets worse suddenly. ? A bulge that gets bigger suddenly, and it does not get smaller after that. ? A bulge that turns red or purple. ? A bulge that is painful when you touch it.  You are a man, and your scrotum: ? Suddenly feels painful. ? Suddenly changes in size.  You cannot  push the hernia in by very gently pressing on it when you are lying down. Do not try to force the bulge back in if it will not push in easily.  You feel sick to your stomach (nauseous), and that feeling does not go away.  You throw up (vomit), and that keeps happening.  You have a fast heartbeat.  You cannot poop (have a bowel movement) or pass gas. These symptoms may be an emergency. Do not wait to see if the symptoms will go away. Get medical help right away. Call your local emergency services (911 in the [place] Summary  An inguinal hernia is when fat or your intestines push through a weak spot in a muscle where your leg meets your lower belly (groin). This causes a  rounded lump (bulge).  If you do not have symptoms, you may not need treatment. If you have symptoms or a large hernia, you may need surgery.  Avoid lifting heavy objects. Also avoid standing for long amounts of time.  Do not try to force the bulge back in if it will not push in easily. This information is not intended to replace advice given to you by your health care provider. Make sure you discuss any questions you have with your health care provider. Document Revised: 06/11/2017 Document Reviewed: 02/09/2017 Elsevier Patient Education  [place].

## 2019-06-22 ENCOUNTER — Ambulatory Visit: Payer: Medicare Other

## 2019-06-22 ENCOUNTER — Other Ambulatory Visit: Payer: Self-pay

## 2019-06-22 ENCOUNTER — Telehealth: Payer: Self-pay | Admitting: Family Medicine

## 2019-06-22 DIAGNOSIS — K4090 Unilateral inguinal hernia, without obstruction or gangrene, not specified as recurrent: Secondary | ICD-10-CM

## 2019-06-22 NOTE — Telephone Encounter (Signed)
Referral placed.

## 2019-06-22 NOTE — Telephone Encounter (Signed)
Patient calls in saying he spoke with Dr.Parker about his hernia issues and they discussed surgery, William West is stating that he is ready for surgery and would like a referral. Patient would a phone call once the referral is put in.

## 2019-06-25 ENCOUNTER — Telehealth: Payer: Self-pay

## 2019-06-25 NOTE — Telephone Encounter (Signed)
Patient calling to following up on a referral please call patient

## 2019-06-25 NOTE — Telephone Encounter (Signed)
Spoke with patient I will check on referral and give him a call tomorrow

## 2019-06-30 ENCOUNTER — Ambulatory Visit: Payer: Medicare Other

## 2019-07-01 ENCOUNTER — Ambulatory Visit: Payer: Medicare Other

## 2019-07-02 ENCOUNTER — Ambulatory Visit: Payer: Self-pay | Admitting: General Surgery

## 2019-07-02 DIAGNOSIS — K4090 Unilateral inguinal hernia, without obstruction or gangrene, not specified as recurrent: Secondary | ICD-10-CM | POA: Diagnosis not present

## 2019-07-02 NOTE — H&P (Signed)
History of Present Illness Ralene Ok MD; 07/02/2019 2:57 PM) The patient is a 69 year old male who presents with an inguinal hernia. Referred by: Dr. Dimas Chyle Chief Complaint: Right inguinal hernia  Patient is a 69 year old male who has a history of rheumatoid arthritis - on Embrel, A. fib on diltiazem, with a four-month history of a right inguinal hernia. 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 Patient was recently seen by Dr. [name] and was found to have inguinal hernia. Patient recently getting over left gluteal abscess infection. Patient has been on doxycycline, and been off of his EMbrel.  Patient is a previous laparoscopic cholecystectomy.    Past Surgical History (Tanisha A. Owens Shark, [place]; 07/02/2019 2:39 PM) Gallbladder Surgery - Laparoscopic  Oral Surgery  Tonsillectomy  Vasectomy   Diagnostic Studies History (Tanisha A. Owens Shark, [place]; 07/02/2019 2:39 PM) Colonoscopy  1-5 years ago  Allergies (Tanisha A. Owens Shark, RMA; 07/02/2019 2:41 PM) Penicillins  Caffeine Anhydrous *ADHD/ANTI-NARCOLEPSY/ANTI-OBESITY/ANOREXIANTS*  Pseudephedrine Plus *COUGH/COLD/ALLERGY*  Methotrexate *ANTINEOPLASTICS AND ADJUNCTIVE THERAPIES*  Allergies Reconciled   Medication History (Tanisha A. Owens Shark, RMA; 07/02/2019 2:43 PM) dilTIAZem HCl ER Coated Beads (120MG  Capsule ER 24HR, Oral) Active. Doxycycline Hyclate (100MG  Capsule, Oral) Active. Omeprazole (40MG  Capsule DR, Oral) Active. Enbrel (25MG /0.5ML Solution, Subcutaneous) Active. LORazepam (1MG  Tablet, Oral) Active. Vitamin E (400UNIT Tablet, Oral) Active. Centrum Men (Oral) Active. Airborne (Oral) Active. Medications Reconciled  Social History (Tanisha A. Owens Shark, [place]; 07/02/2019 2:39 PM) Alcohol use  Occasional alcohol use. No caffeine use  No drug use  Tobacco use  Former smoker.  Family History (Tanisha A. Owens Shark,  [place]; 07/02/2019 2:39 PM) Arthritis  Mother. Colon Polyps  Father. Diabetes Mellitus  Son. Heart Disease  Mother. Prostate Cancer  Father.  Other Problems (Tanisha A. Owens Shark, [place]; 07/02/2019 2:39 PM) Arthritis  Atrial Fibrillation  Diverticulosis  Gastroesophageal Reflux Disease  Hepatitis  Inguinal Hernia     Review of Systems Ralene Ok MD; 07/02/2019 2:55 PM) General Not Present- Appetite Loss, Chills, Fatigue, Fever, Night Sweats, Weight Gain and Weight Loss. Skin Not Present- Change in Wart/Mole, Dryness, Hives, Jaundice, New Lesions, Non-Healing Wounds, Rash and Ulcer. HEENT Present- Hearing Loss, Ringing in the Ears, Seasonal Allergies and Wears glasses/contact lenses. Not Present- Earache, Hoarseness, Nose Bleed, Oral Ulcers, Sinus Pain, Sore Throat, Visual Disturbances and Yellow Eyes. Respiratory Not Present- Bloody sputum, Chronic Cough, Difficulty Breathing, Snoring and Wheezing. Breast Not Present- Breast Mass, Breast Pain, Nipple Discharge and Skin Changes. Cardiovascular Present- Palpitations. Not Present- Chest Pain, Difficulty Breathing Lying Down, Leg Cramps, Rapid Heart Rate, Shortness of Breath and Swelling of Extremities. Gastrointestinal Present- Indigestion. Not Present- Abdominal Pain, Bloating, Bloody Stool, Change in Bowel Habits, Chronic diarrhea, Constipation, Difficulty Swallowing, Excessive gas, Gets full quickly at meals, Hemorrhoids, Nausea, Rectal Pain and Vomiting. Male Genitourinary Not Present- Blood in Urine, Change in Urinary Stream, Frequency, Impotence, Nocturia, Painful Urination, Urgency and Urine Leakage. Musculoskeletal Present- Joint Pain. Not Present- Back Pain, Joint Stiffness, Muscle Pain, Muscle Weakness and Swelling of Extremities. Neurological Not Present- Decreased Memory, Fainting, Headaches, Numbness, Seizures, Tingling, Tremor, Trouble walking and Weakness. Psychiatric Not Present- Anxiety, Bipolar, Change in Sleep Pattern,  Depression, Fearful and Frequent crying. Endocrine Not Present- Cold Intolerance, Excessive Hunger, Hair Changes, Heat Intolerance, Hot flashes and New Diabetes. Hematology Not Present- Blood Thinners, Easy Bruising, Excessive bleeding, Gland problems, HIV and Persistent Infections. All other systems negative  Vitals Yvetta Coder  Kristian Covey RMA; 07/02/2019 2:43 PM) 07/02/2019 2:43 PM Weight: 206 lb Height: 71in Body Surface Area: 2.14 m Body Mass Index: 28.73 kg/m  Temp.: 98.2F  Pulse: 78 (Regular)  BP: 126/86 (Sitting, Left Arm, Standard)       Physical Exam Ralene Ok MD; 07/02/2019 2:58 PM) The physical exam findings are as follows: Note:Constitutional: No acute distress, conversant, appears stated age  Eyes: Anicteric sclerae, moist conjunctiva, no lid lag  Neck: No thyromegaly, trachea midline, no cervical lymphadenopathy  Lungs: Clear to auscultation biilaterally, normal respiratory effot  Cardiovascular: regular rate & rhythm, no murmurs, no peripheal edema, pedal pulses 2+  GI: Soft, no masses or hepatosplenomegaly, non-tender to palpation  MSK: Normal gait, no clubbing cyanosis, edema  Skin: No rashes, palpation reveals normal skin turgor  Psychiatric: Appropriate judgment and insight, oriented to person, place, and time  Abdomen Inspection Hernias - Right - Inguinal hernia - Reducible - Right.    Assessment & Plan Ralene Ok MD; 07/02/2019 2:58 PM) RIGHT INGUINAL HERNIA (K40.90) Impression: 69 year old male with a history of RA, A. fib, on times in  Patient with right inguinal hernia. 1. The patient will like to proceed to the operating room for laparoscopic right inguinal hernia repair with mesh.  2. I discussed with the patient the signs and symptoms of incarceration and strangulation and the need to proceed to the ER should they occur.  3. I discussed with the patient the risks and benefits of the procedure to include but not limited to:  Infection, bleeding, damage to surrounding structures, possible need for further surgery, possible nerve pain, and possible recurrence. The patient was understanding and wishes to proceed.  I reviewed the patient's external notes from the referring physicians as well as consulting physician team.

## 2019-07-02 NOTE — H&P (View-Only) (Signed)
History of Present Illness Ralene Ok MD; 07/02/2019 2:57 PM) The patient is a 69 year old male who presents with an inguinal hernia. Referred by: Dr. Dimas Chyle Chief Complaint: Right inguinal hernia  Patient is a 69 year old male who has a history of rheumatoid arthritis - on Embrel, A. fib on diltiazem, with a four-month history of a right inguinal hernia. 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 Patient was recently seen by Dr. [name] and was found to have inguinal hernia. Patient recently getting over left gluteal abscess infection. Patient has been on doxycycline, and been off of his EMbrel.  Patient is a previous laparoscopic cholecystectomy.    Past Surgical History (Tanisha A. Owens Shark, [place]; 07/02/2019 2:39 PM) Gallbladder Surgery - Laparoscopic  Oral Surgery  Tonsillectomy  Vasectomy   Diagnostic Studies History (Tanisha A. Owens Shark, [place]; 07/02/2019 2:39 PM) Colonoscopy  1-5 years ago  Allergies (Tanisha A. Owens Shark, RMA; 07/02/2019 2:41 PM) Penicillins  Caffeine Anhydrous *ADHD/ANTI-NARCOLEPSY/ANTI-OBESITY/ANOREXIANTS*  Pseudephedrine Plus *COUGH/COLD/ALLERGY*  Methotrexate *ANTINEOPLASTICS AND ADJUNCTIVE THERAPIES*  Allergies Reconciled   Medication History (Tanisha A. Owens Shark, RMA; 07/02/2019 2:43 PM) dilTIAZem HCl ER Coated Beads (120MG  Capsule ER 24HR, Oral) Active. Doxycycline Hyclate (100MG  Capsule, Oral) Active. Omeprazole (40MG  Capsule DR, Oral) Active. Enbrel (25MG /0.5ML Solution, Subcutaneous) Active. LORazepam (1MG  Tablet, Oral) Active. Vitamin E (400UNIT Tablet, Oral) Active. Centrum Men (Oral) Active. Airborne (Oral) Active. Medications Reconciled  Social History (Tanisha A. Owens Shark, [place]; 07/02/2019 2:39 PM) Alcohol use  Occasional alcohol use. No caffeine use  No drug use  Tobacco use  Former smoker.  Family History (Tanisha A. Owens Shark,  [place]; 07/02/2019 2:39 PM) Arthritis  Mother. Colon Polyps  Father. Diabetes Mellitus  Son. Heart Disease  Mother. Prostate Cancer  Father.  Other Problems (Tanisha A. Owens Shark, [place]; 07/02/2019 2:39 PM) Arthritis  Atrial Fibrillation  Diverticulosis  Gastroesophageal Reflux Disease  Hepatitis  Inguinal Hernia     Review of Systems Ralene Ok MD; 07/02/2019 2:55 PM) General Not Present- Appetite Loss, Chills, Fatigue, Fever, Night Sweats, Weight Gain and Weight Loss. Skin Not Present- Change in Wart/Mole, Dryness, Hives, Jaundice, New Lesions, Non-Healing Wounds, Rash and Ulcer. HEENT Present- Hearing Loss, Ringing in the Ears, Seasonal Allergies and Wears glasses/contact lenses. Not Present- Earache, Hoarseness, Nose Bleed, Oral Ulcers, Sinus Pain, Sore Throat, Visual Disturbances and Yellow Eyes. Respiratory Not Present- Bloody sputum, Chronic Cough, Difficulty Breathing, Snoring and Wheezing. Breast Not Present- Breast Mass, Breast Pain, Nipple Discharge and Skin Changes. Cardiovascular Present- Palpitations. Not Present- Chest Pain, Difficulty Breathing Lying Down, Leg Cramps, Rapid Heart Rate, Shortness of Breath and Swelling of Extremities. Gastrointestinal Present- Indigestion. Not Present- Abdominal Pain, Bloating, Bloody Stool, Change in Bowel Habits, Chronic diarrhea, Constipation, Difficulty Swallowing, Excessive gas, Gets full quickly at meals, Hemorrhoids, Nausea, Rectal Pain and Vomiting. Male Genitourinary Not Present- Blood in Urine, Change in Urinary Stream, Frequency, Impotence, Nocturia, Painful Urination, Urgency and Urine Leakage. Musculoskeletal Present- Joint Pain. Not Present- Back Pain, Joint Stiffness, Muscle Pain, Muscle Weakness and Swelling of Extremities. Neurological Not Present- Decreased Memory, Fainting, Headaches, Numbness, Seizures, Tingling, Tremor, Trouble walking and Weakness. Psychiatric Not Present- Anxiety, Bipolar, Change in Sleep Pattern,  Depression, Fearful and Frequent crying. Endocrine Not Present- Cold Intolerance, Excessive Hunger, Hair Changes, Heat Intolerance, Hot flashes and New Diabetes. Hematology Not Present- Blood Thinners, Easy Bruising, Excessive bleeding, Gland problems, HIV and Persistent Infections. All other systems negative  Vitals Yvetta Coder  Kristian Covey RMA; 07/02/2019 2:43 PM) 07/02/2019 2:43 PM Weight: 206 lb Height: 71in Body Surface Area: 2.14 m Body Mass Index: 28.73 kg/m  Temp.: 98.30F  Pulse: 78 (Regular)  BP: 126/86 (Sitting, Left Arm, Standard)       Physical Exam Ralene Ok MD; 07/02/2019 2:58 PM) The physical exam findings are as follows: Note:Constitutional: No acute distress, conversant, appears stated age  Eyes: Anicteric sclerae, moist conjunctiva, no lid lag  Neck: No thyromegaly, trachea midline, no cervical lymphadenopathy  Lungs: Clear to auscultation biilaterally, normal respiratory effot  Cardiovascular: regular rate & rhythm, no murmurs, no peripheal edema, pedal pulses 2+  GI: Soft, no masses or hepatosplenomegaly, non-tender to palpation  MSK: Normal gait, no clubbing cyanosis, edema  Skin: No rashes, palpation reveals normal skin turgor  Psychiatric: Appropriate judgment and insight, oriented to person, place, and time  Abdomen Inspection Hernias - Right - Inguinal hernia - Reducible - Right.    Assessment & Plan Ralene Ok MD; 07/02/2019 2:58 PM) RIGHT INGUINAL HERNIA (K40.90) Impression: 69 year old male with a history of RA, A. fib, on times in  Patient with right inguinal hernia. 1. The patient will like to proceed to the operating room for laparoscopic right inguinal hernia repair with mesh.  2. I discussed with the patient the signs and symptoms of incarceration and strangulation and the need to proceed to the ER should they occur.  3. I discussed with the patient the risks and benefits of the procedure to include but not limited to:  Infection, bleeding, damage to surrounding structures, possible need for further surgery, possible nerve pain, and possible recurrence. The patient was understanding and wishes to proceed.  I reviewed the patient's external notes from the referring physicians as well as consulting physician team.

## 2019-07-09 ENCOUNTER — Ambulatory Visit: Payer: Medicare Other

## 2019-07-18 DIAGNOSIS — M0579 Rheumatoid arthritis with rheumatoid factor of multiple sites without organ or systems involvement: Secondary | ICD-10-CM | POA: Diagnosis not present

## 2019-07-18 DIAGNOSIS — Z79899 Other long term (current) drug therapy: Secondary | ICD-10-CM | POA: Diagnosis not present

## 2019-07-18 DIAGNOSIS — Z6827 Body mass index (BMI) 27.0-27.9, adult: Secondary | ICD-10-CM | POA: Diagnosis not present

## 2019-07-18 DIAGNOSIS — E663 Overweight: Secondary | ICD-10-CM | POA: Diagnosis not present

## 2019-07-23 DIAGNOSIS — L570 Actinic keratosis: Secondary | ICD-10-CM | POA: Diagnosis not present

## 2019-07-23 DIAGNOSIS — L821 Other seborrheic keratosis: Secondary | ICD-10-CM | POA: Diagnosis not present

## 2019-07-23 NOTE — Progress Notes (Signed)
Your procedure is scheduled on Friday, July 27, 2019.  Report to [facility] Main Entrance "A" at 07:15 A.M., and check in at the Admitting office.  Call this number if you have problems the morning of surgery:  [phone]  Call [phone] if you have any questions prior to your surgery date Monday-Friday 8am-4pm    Remember:  Do not eat or drink after midnight the night before your surgery   Take these medicines the morning of surgery with A SIP OF WATER: acetaminophen (TYLENOL) - as needed diltiazem (CARDIZEM CD)   >>Follow your surgeon's instructions on when to stop Aspirin.  If no instructions were given by your surgeon then you will need to call the office to get those instructions.    >>As of today, STOP taking any Aleve, Naproxen, Ibuprofen, Motrin, Advil, Goody's, BC's, all herbal medications, fish oil, and all vitamins.    The Morning of Surgery  Do not wear jewelry.  Do not wear lotions, powders, colognes, or deodorant  Men may shave face and neck.  Do not bring valuables to the hospital.  [facility] is not responsible for any belongings or valuables.  If you are a smoker, DO NOT Smoke 24 hours prior to surgery  If you wear a CPAP at night please bring your mask the morning of surgery   Remember that you must have someone to transport you home after your surgery, and remain with you for 24 hours if you are discharged the same day.   Please bring cases for contacts, glasses, hearing aids, dentures or bridgework because it cannot be worn into surgery.    Leave your suitcase in the car.  After surgery it may be brought to your room.  For patients admitted to the hospital, discharge time will be determined by your treatment team.  Patients discharged the day of surgery will not be allowed to drive home.    Special instructions:   [place]- Preparing For Surgery  Before surgery, you can play an important role. Because skin is not sterile, your skin needs  to be as free of germs as possible. You can reduce the number of germs on your skin by washing with CHG (chlorahexidine gluconate) Soap before surgery.  CHG is an antiseptic cleaner which kills germs and bonds with the skin to continue killing germs even after washing.    Oral Hygiene is also important to reduce your risk of infection.  Remember - BRUSH YOUR TEETH THE MORNING OF SURGERY WITH YOUR REGULAR TOOTHPASTE  Please do not use if you have an allergy to CHG or antibacterial soaps. If your skin becomes reddened/irritated stop using the CHG.  Do not shave (including legs and underarms) for at least 48 hours prior to first CHG shower. It is OK to shave your face.  Please follow these instructions carefully.   1. Shower the NIGHT BEFORE SURGERY and the MORNING OF SURGERY with CHG Soap.   2. If you chose to wash your hair, wash your hair first as usual with your normal shampoo.  3. After you shampoo, rinse your hair and body thoroughly to remove the shampoo.  4. Use CHG as you would any other liquid soap. You can apply CHG directly to the skin and wash gently with a scrungie or a clean washcloth.   5. Apply the CHG Soap to your body ONLY FROM THE NECK DOWN.  Do not use on open wounds or open sores. Avoid contact with your eyes, ears, mouth  and genitals (private parts). Wash Face and genitals (private parts)  with your normal soap.   6. Wash thoroughly, paying special attention to the area where your surgery will be performed.  7. Thoroughly rinse your body with warm water from the neck down.  8. DO NOT shower/wash with your normal soap after using and rinsing off the CHG Soap.  9. Pat yourself dry with a CLEAN TOWEL.  10. Wear CLEAN PAJAMAS to bed the night before surgery, wear comfortable clothes the morning of surgery  11. Place CLEAN SHEETS on your bed the night of your first shower and DO NOT SLEEP WITH PETS.    Day of Surgery:  Please shower the morning of surgery with the  CHG soap Do not apply any deodorants/lotions. Please wear clean clothes to the hospital/surgery center.   Remember to brush your teeth WITH YOUR REGULAR TOOTHPASTE.   Please read over the following fact sheets that you were given.

## 2019-07-24 ENCOUNTER — Encounter (HOSPITAL_COMMUNITY): Payer: Self-pay

## 2019-07-24 ENCOUNTER — Encounter (HOSPITAL_COMMUNITY)
Admission: RE | Admit: 2019-07-24 | Discharge: 2019-07-24 | Disposition: A | Discharge status: Home / Self Care | Payer: Medicare Other | Source: Ambulatory Visit | Attending: General Surgery | Admitting: General Surgery

## 2019-07-24 ENCOUNTER — Other Ambulatory Visit (HOSPITAL_COMMUNITY)
Admission: RE | Admit: 2019-07-24 | Discharge: 2019-07-24 | Disposition: A | Discharge status: Home / Self Care | Payer: Medicare Other | Source: Ambulatory Visit | Attending: General Surgery | Admitting: General Surgery

## 2019-07-24 ENCOUNTER — Other Ambulatory Visit: Payer: Self-pay

## 2019-07-24 DIAGNOSIS — Z01818 Encounter for other preprocedural examination: Secondary | ICD-10-CM | POA: Diagnosis not present

## 2019-07-24 DIAGNOSIS — Z20822 Contact with and (suspected) exposure to covid-19: Secondary | ICD-10-CM | POA: Diagnosis not present

## 2019-07-24 LAB — BASIC METABOLIC PANEL
Anion gap: 8 (ref 5–15)
BUN: 15 mg/dL (ref 8–23)
CO2: 27 mmol/L (ref 22–32)
Calcium: 9.1 mg/dL (ref 8.9–10.3)
Chloride: 105 mmol/L (ref 98–111)
Creatinine, Ser: 1.14 mg/dL (ref 0.61–1.24)
GFR calc Af Amer: >60 mL/min (ref >60)
GFR calc non Af Amer: >60 mL/min (ref >60)
Glucose, Bld: 96 mg/dL (ref 70–99)
Potassium: 4.2 mmol/L (ref 3.5–5.1)
Sodium: 140 mmol/L (ref 135–145)

## 2019-07-24 LAB — SARS CORONAVIRUS 2 (TAT 6-24 HRS): SARS Coronavirus 2: NEGATIVE

## 2019-07-24 LAB — CBC
HCT: 44.4 % (ref 39.0–52.0)
Hemoglobin: 14.6 g/dL (ref 13.0–17.0)
MCH: 29.1 pg (ref 26.0–34.0)
MCHC: 32.9 g/dL (ref 30.0–36.0)
MCV: 88.4 fL (ref 80.0–100.0)
Platelets: 227 10*3/uL (ref 150–400)
RBC: 5.02 MIL/uL (ref 4.22–5.81)
RDW: 14 % (ref 11.5–15.5)
WBC: 6.8 10*3/uL (ref 4.0–10.5)
nRBC: 0 % (ref 0.0–0.2)

## 2019-07-24 NOTE — Progress Notes (Signed)
PCP - Dimas Chyle, MD Cardiologist - Cristopher Peru, MD  Chest x-ray - n/a EKG - 07/24/19 Stress Test - denies ECHO - denies Cardiac Cath - denies  Aspirin Instructions: Follow your surgeon's instructions on when to stop Aspirin.  If no instructions were given by your surgeon then you will need to call the office to get those instructions.    >>per pt, he takes ASA 81mg  as needed for a-fib. Dr. Lovena Le instructed pt to take ASA 81mg  if he feels like he's in a-fib (pt states "I can feel when I'm in a-fib, so I'll take aspirin if that happens. Dr. Lovena Le told me I can also take up to 4 cardizem pills in a 24hr period if I needed to if I was in a-fib". "I haven't had a-fib in a while, I haven't taken aspirin in 3-50months. I really only take aspirin about 3 times a year if that"  Per pt, last dose ASA 3-28months ago.  Jeneen Rinks, [place] aware.     COVID TEST- 07/24/19 after PAT appt.    Anesthesia review: no  Patient denies shortness of breath, fever, cough and chest pain at PAT appointment   All instructions explained to the patient, with a verbal understanding of the material. Patient agrees to go over the instructions while at home for a better understanding. Patient also instructed to self quarantine after being tested for COVID-19. The opportunity to ask questions was provided.

## 2019-07-25 NOTE — Anesthesia Preprocedure Evaluation (Addendum)
Anesthesia Evaluation  Patient identified by MRN, date of birth, ID band Patient awake    Reviewed: Allergy & Precautions, NPO status , Patient's Chart, lab work & pertinent test results  Airway Mallampati: II  TM Distance: >3 FB Neck ROM: Full    Dental no notable dental hx. (+) Teeth Intact, Dental Advisory Given   Pulmonary former smoker,  Quit smoking 2011, 22.5 pack year history    Pulmonary exam normal breath sounds clear to auscultation       Cardiovascular Normal cardiovascular exam+ dysrhythmias Atrial Fibrillation  Rhythm:Regular Rate:Normal  HLD   Neuro/Psych negative neurological ROS  negative psych ROS   GI/Hepatic Neg liver ROS, GERD  Medicated and Controlled,Right inguinal hernia    Endo/Other  negative endocrine ROS  Renal/GU negative Renal ROS  negative genitourinary   Musculoskeletal  (+) Arthritis , Rheumatoid disorders,    Abdominal   Peds negative pediatric ROS (+)  Hematology negative hematology ROS (+) hct 44.4, plt 227   Anesthesia Other Findings   Reproductive/Obstetrics negative OB ROS                           Anesthesia Physical Anesthesia Plan  ASA: II  Anesthesia Plan: General   Post-op Pain Management:    Induction: Intravenous  PONV Risk Score and Plan: 3 and Ondansetron, Dexamethasone, Midazolam and Treatment may vary due to age or medical condition  Airway Management Planned: Oral ETT  Additional Equipment: None  Intra-op Plan:   Post-operative Plan: Extubation in OR  Informed Consent: I have reviewed the patients History and Physical, chart, labs and discussed the procedure including the risks, benefits and alternatives for the proposed anesthesia with the patient or authorized representative who has indicated his/her understanding and acceptance.     Dental advisory given  Plan Discussed with: CRNA  Anesthesia Plan Comments: ( )        Anesthesia Quick Evaluation

## 2019-07-25 NOTE — Progress Notes (Signed)
Anesthesia Chart Review:  Follows yearly with cardiologist Dr. Lovena Le for hx of paroxysmal afib and syncope felt due to autonomic dysfunction. He was last seen 02/06/19. Per note, "PAF - he has had a single episode in the past year. I encouraged him to continue watchful waiting. He will continue his cardizem. Syncope - he has had none since his last visit."  Pt reports he has been instructed previously by Dr. Lovena Le to take 81 mg ASA when he feels he is in afib. Has not taken recently.   Rheumatoid arthritis followed by Dr. Amil Amen. Maintained on Enbrel.   Lung nodule followed by Dr. Lamonte Sakai. Stable, next planned surveillance scan Aug 2021.   Preop labs reviewed, WNL.  EKG 07/25/19: Sinus rhythm with occasional Premature ventricular complexes. Rate 61.   Wynonia Musty [facility] Short Stay Center/Anesthesiology Phone [phone] 07/25/2019 2:04 PM

## 2019-07-27 ENCOUNTER — Other Ambulatory Visit: Payer: Self-pay

## 2019-07-27 ENCOUNTER — Encounter (HOSPITAL_COMMUNITY)
Admission: RE | Disposition: A | Discharge status: Home / Self Care | Payer: Self-pay | Source: Home / Self Care | Attending: General Surgery

## 2019-07-27 ENCOUNTER — Ambulatory Visit (HOSPITAL_COMMUNITY)
Admission: RE | Admit: 2019-07-27 | Discharge: 2019-07-27 | Disposition: A | Discharge status: Home / Self Care | Payer: Medicare Other | Attending: General Surgery | Admitting: General Surgery

## 2019-07-27 ENCOUNTER — Ambulatory Visit (HOSPITAL_COMMUNITY): Payer: Medicare Other | Admitting: Physician Assistant

## 2019-07-27 ENCOUNTER — Ambulatory Visit (HOSPITAL_COMMUNITY): Payer: Medicare Other | Admitting: Anesthesiology

## 2019-07-27 ENCOUNTER — Encounter (HOSPITAL_COMMUNITY): Payer: Self-pay | Admitting: General Surgery

## 2019-07-27 DIAGNOSIS — E785 Hyperlipidemia, unspecified: Secondary | ICD-10-CM | POA: Insufficient documentation

## 2019-07-27 DIAGNOSIS — I4891 Unspecified atrial fibrillation: Secondary | ICD-10-CM | POA: Insufficient documentation

## 2019-07-27 DIAGNOSIS — Z888 Allergy status to other drugs, medicaments and biological substances status: Secondary | ICD-10-CM | POA: Diagnosis not present

## 2019-07-27 DIAGNOSIS — Z833 Family history of diabetes mellitus: Secondary | ICD-10-CM | POA: Insufficient documentation

## 2019-07-27 DIAGNOSIS — Z9049 Acquired absence of other specified parts of digestive tract: Secondary | ICD-10-CM | POA: Insufficient documentation

## 2019-07-27 DIAGNOSIS — Z87891 Personal history of nicotine dependence: Secondary | ICD-10-CM | POA: Diagnosis not present

## 2019-07-27 DIAGNOSIS — Z8261 Family history of arthritis: Secondary | ICD-10-CM | POA: Insufficient documentation

## 2019-07-27 DIAGNOSIS — Z880 Allergy status to penicillin: Secondary | ICD-10-CM | POA: Diagnosis not present

## 2019-07-27 DIAGNOSIS — Z8249 Family history of ischemic heart disease and other diseases of the circulatory system: Secondary | ICD-10-CM | POA: Insufficient documentation

## 2019-07-27 DIAGNOSIS — K4090 Unilateral inguinal hernia, without obstruction or gangrene, not specified as recurrent: Secondary | ICD-10-CM | POA: Insufficient documentation

## 2019-07-27 DIAGNOSIS — Z8371 Family history of colonic polyps: Secondary | ICD-10-CM | POA: Insufficient documentation

## 2019-07-27 DIAGNOSIS — Z8042 Family history of malignant neoplasm of prostate: Secondary | ICD-10-CM | POA: Diagnosis not present

## 2019-07-27 DIAGNOSIS — M069 Rheumatoid arthritis, unspecified: Secondary | ICD-10-CM | POA: Diagnosis not present

## 2019-07-27 DIAGNOSIS — K219 Gastro-esophageal reflux disease without esophagitis: Secondary | ICD-10-CM | POA: Diagnosis not present

## 2019-07-27 DIAGNOSIS — K5790 Diverticulosis of intestine, part unspecified, without perforation or abscess without bleeding: Secondary | ICD-10-CM | POA: Diagnosis not present

## 2019-07-27 DIAGNOSIS — D176 Benign lipomatous neoplasm of spermatic cord: Secondary | ICD-10-CM | POA: Diagnosis not present

## 2019-07-27 HISTORY — PX: INGUINAL HERNIA REPAIR: SHX194

## 2019-07-27 SURGERY — REPAIR, HERNIA, INGUINAL, LAPAROSCOPIC
Anesthesia: General | Laterality: Right

## 2019-07-27 MED ORDER — DEXAMETHASONE SODIUM PHOSPHATE 10 MG/ML IJ SOLN
INTRAMUSCULAR | Status: AC
  Filled 2019-07-27: qty 1

## 2019-07-27 MED ORDER — SUGAMMADEX SODIUM 200 MG/2ML IV SOLN
INTRAVENOUS | Status: DC | PRN
  Administered 2019-07-27: 200 mg via INTRAVENOUS

## 2019-07-27 MED ORDER — 0.9 % SODIUM CHLORIDE (POUR BTL) OPTIME
TOPICAL | Status: DC | PRN
  Administered 2019-07-27: 1000 mL

## 2019-07-27 MED ORDER — OXYCODONE HCL 5 MG/5ML PO SOLN: 5.0000 mg | Freq: Once | ORAL | Status: AC | PRN

## 2019-07-27 MED ORDER — MIDAZOLAM HCL 2 MG/2ML IJ SOLN
INTRAMUSCULAR | Status: AC
  Filled 2019-07-27: qty 2

## 2019-07-27 MED ORDER — EPHEDRINE 5 MG/ML INJ
INTRAVENOUS | Status: AC
  Filled 2019-07-27: qty 10

## 2019-07-27 MED ORDER — MIDAZOLAM HCL 5 MG/5ML IJ SOLN
INTRAMUSCULAR | Status: DC | PRN
  Administered 2019-07-27: 2 mg via INTRAVENOUS

## 2019-07-27 MED ORDER — TRAMADOL HCL 50 MG PO TABS: 50.0000 mg | ORAL_TABLET | Freq: Four times a day (QID) | ORAL | 0 refills | Status: DC | PRN

## 2019-07-27 MED ORDER — BUPIVACAINE HCL (PF) 0.25 % IJ SOLN
INTRAMUSCULAR | Status: DC | PRN
  Administered 2019-07-27: 8 mL

## 2019-07-27 MED ORDER — LIDOCAINE 2% (20 MG/ML) 5 ML SYRINGE
INTRAMUSCULAR | Status: DC | PRN
  Administered 2019-07-27: 60 mg via INTRAVENOUS

## 2019-07-27 MED ORDER — CHLORHEXIDINE GLUCONATE CLOTH 2 % EX PADS: 6.0000 | MEDICATED_PAD | Freq: Once | CUTANEOUS | Status: DC

## 2019-07-27 MED ORDER — EPHEDRINE SULFATE-NACL 50-0.9 MG/10ML-% IV SOSY
PREFILLED_SYRINGE | INTRAVENOUS | Status: DC | PRN
  Administered 2019-07-27 (×2): 5 mg via INTRAVENOUS

## 2019-07-27 MED ORDER — ONDANSETRON HCL 4 MG/2ML IJ SOLN
INTRAMUSCULAR | Status: AC
  Filled 2019-07-27: qty 2

## 2019-07-27 MED ORDER — OXYCODONE HCL 5 MG PO TABS
ORAL_TABLET | ORAL | Status: AC
  Filled 2019-07-27: qty 1

## 2019-07-27 MED ORDER — ONDANSETRON HCL 4 MG/2ML IJ SOLN
INTRAMUSCULAR | Status: DC | PRN
  Administered 2019-07-27: 4 mg via INTRAVENOUS

## 2019-07-27 MED ORDER — LIDOCAINE 2% (20 MG/ML) 5 ML SYRINGE
INTRAMUSCULAR | Status: AC
  Filled 2019-07-27: qty 5

## 2019-07-27 MED ORDER — PROPOFOL 10 MG/ML IV BOLUS
INTRAVENOUS | Status: DC | PRN
  Administered 2019-07-27: 150 mg via INTRAVENOUS

## 2019-07-27 MED ORDER — ROCURONIUM BROMIDE 10 MG/ML (PF) SYRINGE
PREFILLED_SYRINGE | INTRAVENOUS | Status: AC
  Filled 2019-07-27: qty 10

## 2019-07-27 MED ORDER — CEFAZOLIN SODIUM-DEXTROSE 2-4 GM/100ML-% IV SOLN
2.0000 g | INTRAVENOUS | Status: AC
  Administered 2019-07-27: 2 g via INTRAVENOUS
  Filled 2019-07-27: qty 100

## 2019-07-27 MED ORDER — LACTATED RINGERS IV SOLN: INTRAVENOUS | Status: DC

## 2019-07-27 MED ORDER — ACETAMINOPHEN 500 MG PO TABS
1000.0000 mg | ORAL_TABLET | ORAL | Status: AC
  Administered 2019-07-27: 1000 mg via ORAL
  Filled 2019-07-27: qty 2

## 2019-07-27 MED ORDER — BUPIVACAINE HCL (PF) 0.25 % IJ SOLN
INTRAMUSCULAR | Status: AC
  Filled 2019-07-27: qty 30

## 2019-07-27 MED ORDER — LACTATED RINGERS IV SOLN: INTRAVENOUS | Status: DC | PRN

## 2019-07-27 MED ORDER — ROCURONIUM BROMIDE 10 MG/ML (PF) SYRINGE
PREFILLED_SYRINGE | INTRAVENOUS | Status: DC | PRN
  Administered 2019-07-27: 50 mg via INTRAVENOUS

## 2019-07-27 MED ORDER — DEXAMETHASONE SODIUM PHOSPHATE 10 MG/ML IJ SOLN
INTRAMUSCULAR | Status: DC | PRN
  Administered 2019-07-27: 10 mg via INTRAVENOUS

## 2019-07-27 MED ORDER — FENTANYL CITRATE (PF) 250 MCG/5ML IJ SOLN
INTRAMUSCULAR | Status: AC
  Filled 2019-07-27: qty 5

## 2019-07-27 MED ORDER — PROPOFOL 10 MG/ML IV BOLUS
INTRAVENOUS | Status: AC
  Filled 2019-07-27: qty 20

## 2019-07-27 MED ORDER — OXYCODONE HCL 5 MG PO TABS
5.0000 mg | ORAL_TABLET | Freq: Once | ORAL | Status: AC | PRN
  Administered 2019-07-27: 5 mg via ORAL

## 2019-07-27 MED ORDER — EPHEDRINE SULFATE 50 MG/ML IJ SOLN: INTRAMUSCULAR | Status: DC | PRN

## 2019-07-27 MED ORDER — FENTANYL CITRATE (PF) 250 MCG/5ML IJ SOLN
INTRAMUSCULAR | Status: DC | PRN
  Administered 2019-07-27 (×2): 50 ug via INTRAVENOUS

## 2019-07-27 MED ORDER — ONDANSETRON HCL 4 MG/2ML IJ SOLN: 4.0000 mg | Freq: Once | INTRAMUSCULAR | Status: DC | PRN

## 2019-07-27 MED ORDER — HYDROMORPHONE HCL 1 MG/ML IJ SOLN: 0.2500 mg | INTRAMUSCULAR | Status: DC | PRN

## 2019-07-27 SURGICAL SUPPLY — 38 items
CANISTER SUCT 3000ML PPV (MISCELLANEOUS) IMPLANT
COVER SURGICAL LIGHT HANDLE (MISCELLANEOUS) ×2 IMPLANT
COVER WAND RF STERILE (DRAPES) ×2 IMPLANT
DEFOGGER SCOPE WARMER CLEARIFY (MISCELLANEOUS) ×2 IMPLANT
DERMABOND ADVANCED (GAUZE/BANDAGES/DRESSINGS) ×1
DERMABOND ADVANCED .7 DNX12 (GAUZE/BANDAGES/DRESSINGS) ×1 IMPLANT
DISSECTOR BLUNT TIP ENDO 5MM (MISCELLANEOUS) IMPLANT
ELECT REM PT RETURN 9FT ADLT (ELECTROSURGICAL) ×2
ELECTRODE REM PT RTRN 9FT ADLT (ELECTROSURGICAL) ×1 IMPLANT
GLOVE BIO SURGEON STRL SZ7.5 (GLOVE) ×2 IMPLANT
GOWN STRL REUS W/ TWL LRG LVL3 (GOWN DISPOSABLE) ×2 IMPLANT
GOWN STRL REUS W/ TWL XL LVL3 (GOWN DISPOSABLE) ×1 IMPLANT
GOWN STRL REUS W/TWL LRG LVL3 (GOWN DISPOSABLE) ×4
GOWN STRL REUS W/TWL XL LVL3 (GOWN DISPOSABLE) ×2
KIT BASIN OR (CUSTOM PROCEDURE TRAY) ×2 IMPLANT
KIT TURNOVER KIT B (KITS) ×2 IMPLANT
MESH 3DMAX 5X7 RT XLRG (Mesh General) ×2 IMPLANT
NEEDLE INSUFFLATION 14GA 120MM (NEEDLE) ×2 IMPLANT
NS IRRIG 1000ML POUR BTL (IV SOLUTION) ×2 IMPLANT
PAD ARMBOARD 7.5X6 YLW CONV (MISCELLANEOUS) ×4 IMPLANT
RELOAD STAPLE HERNIA 4.0 BLUE (INSTRUMENTS) ×2 IMPLANT
RELOAD STAPLE HERNIA 4.8 BLK (STAPLE) IMPLANT
SCISSORS LAP 5X35 DISP (ENDOMECHANICALS) ×2 IMPLANT
SET IRRIG TUBING LAPAROSCOPIC (IRRIGATION / IRRIGATOR) IMPLANT
SET TUBE SMOKE EVAC HIGH FLOW (TUBING) ×2 IMPLANT
STAPLER HERNIA 12 8.5 360D (INSTRUMENTS) ×2 IMPLANT
SUT MNCRL AB 4-0 PS2 18 (SUTURE) ×2 IMPLANT
SUT VIC AB 1 CT1 27 (SUTURE)
SUT VIC AB 1 CT1 27XBRD ANBCTR (SUTURE) IMPLANT
SYRINGE TOOMEY DISP (SYRINGE) ×2 IMPLANT
TOWEL GREEN STERILE (TOWEL DISPOSABLE) ×2 IMPLANT
TOWEL GREEN STERILE FF (TOWEL DISPOSABLE) ×2 IMPLANT
TRAY FOLEY W/BAG SLVR 14FR (SET/KITS/TRAYS/PACK) ×2 IMPLANT
TRAY LAPAROSCOPIC MC (CUSTOM PROCEDURE TRAY) ×2 IMPLANT
TROCAR OPTICAL SHORT 5MM (TROCAR) ×2 IMPLANT
TROCAR OPTICAL SLV SHORT 5MM (TROCAR) ×2 IMPLANT
TROCAR XCEL 12X100 BLDLESS (ENDOMECHANICALS) ×2 IMPLANT
WATER STERILE IRR 1000ML POUR (IV SOLUTION) ×2 IMPLANT

## 2019-07-27 NOTE — Interval H&P Note (Signed)
History and Physical Interval Note:  07/27/2019 8:31 AM  William West  has presented today for surgery, with the diagnosis of RIGHT INGUINAL HERNIA.  The various methods of treatment have been discussed with the patient and family. After consideration of risks, benefits and other options for treatment, the patient has consented to  Procedure(s): LAPAROSCOPIC RIGHT INGUINAL HERNIA REPAIR WITH MESH (Right) as a surgical intervention.  The patient's history has been reviewed, patient examined, no change in status, stable for surgery.  I have reviewed the patient's chart and labs.  Questions were answered to the patient's satisfaction.     Ralene Ok

## 2019-07-27 NOTE — Anesthesia Procedure Notes (Signed)

## 2019-07-27 NOTE — Op Note (Signed)
07/27/2019  10:03 AM  PATIENT:  William West  69 y.o. male  PRE-OPERATIVE DIAGNOSIS:  RIGHT INGUINAL HERNIA  POST-OPERATIVE DIAGNOSIS:  RIGHT INDIRECT INGUINAL HERNIA, CORD LIPOMA  PROCEDURE:  Procedure(s): LAPAROSCOPIC RIGHT INGUINAL HERNIA REPAIR WITH MESH (Right)  SURGEON:  Surgeon(s) and Role:    * Ralene Ok, MD - Primary  ANESTHESIA:   local and general  EBL:  10 mL   BLOOD ADMINISTERED:none  DRAINS: none   LOCAL MEDICATIONS USED:  BUPIVICAINE   SPECIMEN:  No Specimen  DISPOSITION OF SPECIMEN:  N/A  COUNTS:  YES  TOURNIQUET:  * No tourniquets in log *  DICTATION: .Dragon Dictation Counts: reported as correct x 2  Findings:  The patient had a moderate right indirect hernia and a moderated sized cord lipoma  Indications for procedure:  The patient is a 69 year old male with a right inguinal hernia for several months. Patient complained of symptomatology to his right inguinal area. The patient was taken back for elective inguinal hernia repair.  Details of the procedure: The patient was taken back to the operating room. The patient was placed in supine position with bilateral SCDs in place.  The patient was prepped and draped in the usual sterile fashion.  After appropriate anitbiotics were confirmed, a time-out was confirmed and all facts were verified.  0.25% Marcaine was used to infiltrate the umbilical area. A 11-blade was used to cut down the skin and blunt dissection was used to get the anterior fashion.  The anterior fascia was incised approximately 1 cm and the muscles were retracted laterally. Blunt dissection was then used to create a space in the preperitoneal area. At this time a 10 mm camera was then introduced into the space and advanced the pubic tubercle and a 12 mm trocar was placed over this and insufflation was started.  At this time and space was created from medial to laterally the preperitoneal space.  Cooper's ligament was initially cleaned  off.  The hernia sac was identified in the indirect space. Dissection of the hernia sac and cord structures was undertaken the vas deferens was identified and protected in all parts of the case.    Once the hernia sac was taken down to approximately the umbilicus a Bard 3D Max mesh, size: Rachelle Hora, was  introduced into the preperitoneal space.  The mesh was brought over to cover the direct and indirect hernia spaces.  This was anchored into place and secured to Cooper's ligament with 4.23mm staples from a Coviden hernia stapler. It was anchored to the anterior abdominal wall with 4.8 mm staples. The hernia sac was seen lying posterior to the mesh. There was no staples placed laterally. The insufflation was evacuated and the peritoneum was seen posterior to the mesh. The trochars were removed. The anterior fascia was reapproximated using #1 Vicryl on a UR- 6.  Intra-abdominal air was evacuated and the Veress needle removed. The skin was reapproximated using 4-0 Monocryl subcuticular fashion and Dermabond. The patient was awakened from general anesthesia and taken to recovery in stable condition.   PLAN OF CARE: Discharge to home after PACU  PATIENT DISPOSITION:  PACU - hemodynamically stable.   Delay start of Pharmacological VTE agent (>24hrs) due to surgical blood loss or risk of bleeding: not applicable

## 2019-07-27 NOTE — Anesthesia Postprocedure Evaluation (Signed)
Anesthesia Post Note  Patient: William West  Procedure(s) Performed: LAPAROSCOPIC RIGHT INGUINAL HERNIA REPAIR WITH MESH (Right )     Patient location during evaluation: PACU Anesthesia Type: General Level of consciousness: awake and alert, oriented and patient cooperative Pain management: pain level controlled Vital Signs Assessment: post-procedure vital signs reviewed and stable Respiratory status: spontaneous breathing, nonlabored ventilation and respiratory function stable Cardiovascular status: blood pressure returned to baseline and stable Postop Assessment: no apparent nausea or vomiting Anesthetic complications: no    Last Vitals:  Vitals:   07/27/19 1030 07/27/19 1045  BP: 139/83 135/72  Pulse: 63 61  Resp: 15 14  Temp:    SpO2: 99% 97%    Last Pain:  Vitals:   07/27/19 1025  TempSrc:   PainSc: [place]

## 2019-07-27 NOTE — Discharge Instructions (Signed)
[facility] _______[facility] °INGUINAL HERNIA REPAIR: POST OP INSTRUCTIONS ° °Always review your discharge instruction sheet given to you by the facility where your surgery was performed. °IF YOU HAVE DISABILITY OR FAMILY LEAVE FORMS, YOU MUST BRING THEM TO THE OFFICE FOR PROCESSING.   °DO NOT GIVE THEM TO YOUR DOCTOR. ° °1. A  prescription for pain medication may be given to you upon discharge.  Take your pain medication as prescribed, if needed.  If narcotic pain medicine is not needed, then you may take acetaminophen (Tylenol) or ibuprofen (Advil) as needed. °2. Take your usually prescribed medications unless otherwise directed. °If you need a refill on your pain medication, please contact your pharmacy.  They will contact our office to request authorization. Prescriptions will not be filled after 5 pm or on week-ends. °3. You should follow a light diet the first 24 hours after arrival home, such as soup and crackers, etc.  Be sure to include lots of fluids daily.  Resume your normal diet the day after surgery. °4.Most patients will experience some swelling and bruising around the umbilicus or in the groin and scrotum.  Ice packs and reclining will help.  Swelling and bruising can take several days to resolve.  °6. It is common to experience some constipation if taking pain medication after surgery.  Increasing fluid intake and taking a stool softener (such as Colace) will usually help or prevent this problem from occurring.  A mild laxative (Milk of Magnesia or Miralax) should be taken according to package directions if there are no bowel movements after 48 hours. °7. Unless discharge instructions indicate otherwise, you may remove your bandages 24-48 hours after surgery, and you may shower at that time.  You may have steri-strips (small skin tapes) in place directly over the incision.  These strips should be left on the skin for 7-10 days.  If your surgeon used skin glue on the incision, you may  shower in 24 hours.  The glue will flake off over the next 2-3 weeks.  Any sutures or staples will be removed at the office during your follow-up visit. °8. ACTIVITIES:  You may resume regular (light) daily activities beginning the next day--such as daily self-care, walking, climbing stairs--gradually increasing activities as tolerated.  You may have sexual intercourse when it is comfortable.  Refrain from any heavy lifting or straining until approved by your doctor. ° °a.You may drive when you are no longer taking prescription pain medication, you can comfortably wear a seatbelt, and you can safely maneuver your car and apply brakes. °b.RETURN TO WORK:   °_____________________________________________ ° °9.You should see your doctor in the office for a follow-up appointment approximately 2-3 weeks after your surgery.  Make sure that you call for this appointment within a day or two after you arrive home to insure a convenient appointment time. °10.OTHER INSTRUCTIONS: _________________________ °   _____________________________________ ° °WHEN TO CALL YOUR DOCTOR: °1. Fever over 101.0 °2. Inability to urinate °3. Nausea and/or vomiting °4. Extreme swelling or bruising °5. Continued bleeding from incision. °6. Increased pain, redness, or drainage from the incision ° °The clinic staff is available to answer your questions during regular business hours.  Please don’t hesitate to call and ask to speak to one of the nurses for clinical concerns.  If you have a medical emergency, go to the nearest emergency room or call 911.  A surgeon from [facility] is always on call at the hospital ° ° °[address], [address], [place], [place]  [address] ? ° [address] [address], [place], [place]   [address] °[phone] ? [phone] ? FAX [phone] °Web site: [email or web address] ° °

## 2019-07-27 NOTE — Transfer of Care (Signed)
Immediate Anesthesia Transfer of Care Note  Patient: William West  Procedure(s) Performed: LAPAROSCOPIC RIGHT INGUINAL HERNIA REPAIR WITH MESH (Right )  Patient Location: PACU  Anesthesia Type:General  Level of Consciousness: awake and alert   Airway & Oxygen Therapy: Patient Spontanous Breathing  Post-op Assessment: Report given to RN, Post -op Vital signs reviewed and stable and Patient moving all extremities X 4  Post vital signs: Reviewed and stable  Last Vitals:  Vitals Value Taken Time  BP    Temp    Pulse 73 07/27/19 1012  Resp    SpO2 97 % 07/27/19 1012  Vitals shown include unvalidated device data.  Last Pain:  Vitals:   07/27/19 0745  TempSrc:   PainSc: 0-No pain         Complications: No apparent anesthesia complications

## 2019-07-30 ENCOUNTER — Encounter: Payer: Self-pay | Admitting: *Deleted

## 2019-09-13 ENCOUNTER — Other Ambulatory Visit: Payer: Self-pay | Admitting: Internal Medicine

## 2019-09-14 MED ORDER — DILTIAZEM HCL ER COATED BEADS 120 MG PO CP24: 120.0000 mg | ORAL_CAPSULE | Freq: Every day | ORAL | 1 refills | Status: DC

## 2019-09-20 ENCOUNTER — Emergency Department (HOSPITAL_COMMUNITY)
Admission: EM | Admit: 2019-09-20 | Discharge: 2019-09-20 | Disposition: A | Discharge status: Home / Self Care | Payer: Medicare Other | Attending: Emergency Medicine | Admitting: Emergency Medicine

## 2019-09-20 ENCOUNTER — Other Ambulatory Visit: Payer: Self-pay

## 2019-09-20 ENCOUNTER — Emergency Department (HOSPITAL_COMMUNITY): Payer: Medicare Other

## 2019-09-20 ENCOUNTER — Encounter (HOSPITAL_COMMUNITY): Payer: Self-pay | Admitting: *Deleted

## 2019-09-20 DIAGNOSIS — I4891 Unspecified atrial fibrillation: Secondary | ICD-10-CM | POA: Diagnosis not present

## 2019-09-20 DIAGNOSIS — Y999 Unspecified external cause status: Secondary | ICD-10-CM | POA: Diagnosis not present

## 2019-09-20 DIAGNOSIS — S46912A Strain of unspecified muscle, fascia and tendon at shoulder and upper arm level, left arm, initial encounter: Secondary | ICD-10-CM | POA: Insufficient documentation

## 2019-09-20 DIAGNOSIS — M069 Rheumatoid arthritis, unspecified: Secondary | ICD-10-CM | POA: Diagnosis not present

## 2019-09-20 DIAGNOSIS — Z87891 Personal history of nicotine dependence: Secondary | ICD-10-CM | POA: Insufficient documentation

## 2019-09-20 DIAGNOSIS — Z79899 Other long term (current) drug therapy: Secondary | ICD-10-CM | POA: Diagnosis not present

## 2019-09-20 DIAGNOSIS — Y92007 Garden or yard of unspecified non-institutional (private) residence as the place of occurrence of the external cause: Secondary | ICD-10-CM | POA: Insufficient documentation

## 2019-09-20 DIAGNOSIS — S61012A Laceration without foreign body of left thumb without damage to nail, initial encounter: Secondary | ICD-10-CM | POA: Insufficient documentation

## 2019-09-20 DIAGNOSIS — W19XXXA Unspecified fall, initial encounter: Secondary | ICD-10-CM

## 2019-09-20 DIAGNOSIS — Y93H2 Activity, gardening and landscaping: Secondary | ICD-10-CM | POA: Insufficient documentation

## 2019-09-20 DIAGNOSIS — S4992XA Unspecified injury of left shoulder and upper arm, initial encounter: Secondary | ICD-10-CM | POA: Diagnosis not present

## 2019-09-20 DIAGNOSIS — M25512 Pain in left shoulder: Secondary | ICD-10-CM | POA: Diagnosis not present

## 2019-09-20 MED ORDER — LIDOCAINE HCL (PF) 1 % IJ SOLN
5.0000 mL | Freq: Once | INTRAMUSCULAR | Status: AC
  Administered 2019-09-20: 5 mL via INTRADERMAL
  Filled 2019-09-20: qty 30

## 2019-09-20 MED ORDER — POVIDONE-IODINE 10 % EX SOLN
CUTANEOUS | Status: DC | PRN
  Administered 2019-09-20: 1 via TOPICAL
  Filled 2019-09-20: qty 15

## 2019-09-20 MED ORDER — ACETAMINOPHEN 325 MG PO TABS
650.0000 mg | ORAL_TABLET | Freq: Once | ORAL | Status: AC
  Administered 2019-09-20: 650 mg via ORAL
  Filled 2019-09-20: qty 2

## 2019-09-20 NOTE — ED Triage Notes (Signed)
Pt states he fell off a lawnmower today and is having pain to left shoulder; pt states he has full ROM to the shoulder its just sore; pt has laceration to left thumb and laceration to right hand

## 2019-09-20 NOTE — ED Provider Notes (Signed)
[facility] EMERGENCY DEPARTMENT Provider Note   CSN: [id number] Arrival date & time: 09/20/19  1553     History Chief Complaint  Patient presents with  . [place] is a 69 y.o. male.  HPI     [facility] is a 69 y.o. male who presents to the Emergency Department complaining of laceration of the left lateral thumb and left shoulder pain.  Symptoms are secondary to a fall from a lawnmower, landing on his left shoulder.  He describes an aching pain to his shoulder.  States he has full range of motion, but pain with abduction.  Unsure what caused the laceration of his thumb, believes it may have from metal edge.  Bleeding controlled with direct pressure.  He also has an abrasion to the right wrist, non-tender.  He denies head injury, neck or back pain, LOC, headache, dizziness and visual changes.  No pain to left arm or neck, no numbness or weakness of the extremities.  Td is up to date.  Past Medical History:  Diagnosis Date  . A-fib ([place])   . Acute blood loss anemia 01/26/2015  . Allergic rhinitis   . Diverticulosis of colon   . Dysplastic nevus   . GERD (gastroesophageal reflux disease)   . GI bleeding 01/24/2015  . Hypercholesterolemia   . Postural dizziness with near syncope due to dehydration 12/09/2016  . RA (rheumatoid arthritis) [facility])     Patient Active Problem List   Diagnosis Date Noted  . Hyperglycemia 04/05/2019  . Family history of prostate cancer 04/03/2019  . GERD (gastroesophageal reflux disease), on Omeprazole 07/01/2018  . History of GI bleed 07/01/2018  . Insomnia 07/01/2018  . Left inguinal hernia, fat filled, with no complications 123456  . Abnormal CT of the chest 03/03/2018  . Sensorineural hearing loss (SNHL), bilateral 12/28/2017  . Tinnitus, bilateral 12/28/2017  . Pulmonary nodule, left 12/09/2016  . Former smoker 01/16/2015  . Atrial fibrillation ([place]), on Cardizem daily, with prn ASA and Diltiazem 04/25/2012  .  Rheumatoid arthritis ([place]), on Enbrel, followed by Dr. Amil Amen 01/06/2011  . Allergic rhinitis 04/22/2009  . Dyslipidemia 10/24/2008  . Diverticulosis of large intestine 06/21/2007    Past Surgical History:  Procedure Laterality Date  . COLONOSCOPY  01/24/2015   x 3  . ERCP N/A 02/18/2015   Procedure: ENDOSCOPIC RETROGRADE CHOLANGIOPANCREATOGRAPHY (ERCP);  Surgeon: Carol Ada, MD;  Location: [facility];  Service: Endoscopy;  Laterality: N/A;  . GIVENS CAPSULE STUDY N/A 01/24/2015   Procedure: GIVENS CAPSULE STUDY;  Surgeon: Juanita Craver, MD;  Location: [place];  Service: Endoscopy;  Laterality: N/A;  . INGUINAL HERNIA REPAIR Right 07/27/2019   Procedure: LAPAROSCOPIC RIGHT INGUINAL HERNIA REPAIR WITH MESH;  Surgeon: Ralene Ok, MD;  Location: [place];  Service: General;  Laterality: Right;  . LAPAROSCOPIC CHOLECYSTECTOMY  2011  . TOE AMPUTATION  1966  . TONSILLECTOMY AND ADENOIDECTOMY         Family History  Problem Relation Age of Onset  . Prostate cancer Father   . Heart disease Mother   . Rheum arthritis Mother     Social History   Tobacco Use  . Smoking status: Former Smoker    Packs/day: 0.50    Years: 45.00    Pack years: 22.50    Types: Cigarettes    Quit date: 11/13/2009    Years since quitting: 9.8  . Smokeless tobacco: Never Used  Substance Use Topics  . Alcohol use: No  Alcohol/week: 0.0 standard drinks  . Drug use: No    Home Medications Prior to Admission medications   Medication Sig Start Date End Date Taking? Authorizing Provider  acetaminophen (TYLENOL) 160 MG/5ML liquid Take 325 mg by mouth every 4 (four) hours as needed for pain.    [provider]  aspirin EC 81 MG tablet Take 81 mg by mouth daily as needed (afib).     [provider]  clotrimazole (LOTRIMIN) 1 % cream Apply 1 application topically daily as needed (jock itch).    [provider]  diltiazem (CARDIZEM CD) 120 MG 24 hr capsule Take 1 capsule (120  mg total) by mouth daily. 09/14/19   Evans Lance, MD  docusate sodium (COLACE) 100 MG capsule Take 100 mg by mouth daily.    [provider]  etanercept (ENBREL) 50 MG/ML injection Inject 50 mg into the skin every Monday.     [provider]  ibuprofen (ADVIL,MOTRIN) 200 MG tablet Take 600 mg by mouth every 8 (eight) hours as needed for moderate pain.     [provider]  LORazepam (ATIVAN) 1 MG tablet Take 1 tablet (1 mg total) by mouth every 8 (eight) hours as needed for anxiety or sleep. Patient taking differently: Take 0.5 mg by mouth at bedtime.  04/03/19   [name], MD  Multiple Vitamin (MULTIVITAMIN) tablet Take 1 tablet by mouth daily.    [provider]  neomycin-bacitracin-polymyxin (NEOSPORIN) ointment Apply 1 application topically as needed for wound care.    [provider]  omeprazole (PRILOSEC) 40 MG capsule Take 40 mg by mouth every evening.     [provider]  traMADol (ULTRAM) 50 MG tablet Take 1 tablet (50 mg total) by mouth every 6 (six) hours as needed. 07/27/19 07/26/20  Ralene Ok, MD  vitamin C (ASCORBIC ACID) 500 MG tablet Take 500 mg by mouth daily.     [provider]    Allergies    Caffeine, Flecainide, Methotrexate derivatives, Pseudoephedrine-ibuprofen, and Penicillins  Review of Systems   Review of Systems  Constitutional: Negative for chills and fever.  Eyes: Negative for visual disturbance.  Respiratory: Negative for chest tightness and shortness of breath.   Cardiovascular: Negative for chest pain.  Musculoskeletal: Positive for arthralgias (left shoulder pain). Negative for joint swelling and neck pain.  Skin: Positive for wound.       Laceration left thumb, abrasion right wrist  Neurological: Negative for dizziness, weakness, numbness and headaches.  Hematological: Does not bruise/bleed easily.  Psychiatric/Behavioral: Negative for confusion.    Physical Exam Updated Vital  Signs BP 138/75 (BP Location: Right Arm)   Pulse (!) 58   Temp 98 F (36.7 C) (Oral)   Resp 16   Ht 5\' 11"  (1.803 m)   Wt 90.7 kg   SpO2 100%   BMI 27.89 kg/m   Physical Exam Constitutional:      Appearance: Normal appearance. He is not ill-appearing.  HENT:     Head: Atraumatic.  Eyes:     Extraocular Movements: Extraocular movements intact.     Pupils: Pupils are equal, round, and reactive to light.  Cardiovascular:     Rate and Rhythm: Normal rate and regular rhythm.     Pulses: Normal pulses.  Pulmonary:     Effort: Pulmonary effort is normal.     Breath sounds: Normal breath sounds.  Musculoskeletal:        General: Signs of injury present. No swelling.  Left shoulder: Tenderness present. No swelling, deformity, bony tenderness or crepitus. Normal range of motion. Normal strength. Normal pulse.     Left hand: Laceration present. No swelling, deformity, tenderness or bony tenderness. Normal range of motion. Normal strength. Normal sensation. Normal capillary refill. Normal pulse.     Cervical back: Full passive range of motion without pain and normal range of motion. No signs of trauma or tenderness. No spinous process tenderness or muscular tenderness.     Comments: 3 cm laceration of the lateral left thumb.  Pt has full ROM of the thumb.  No FB's. Bleeding controlled.  No edema. Abrasion right wrist.  Wrist non-tender.  Tenderness of left AC joint.  Has full ROM.  Mild tenderness with abduction.    Skin:    General: Skin is warm.     Capillary Refill: Capillary refill takes less than 2 seconds.  Neurological:     General: No focal deficit present.     Mental Status: He is alert.     ED Results / Procedures / Treatments   Labs (all labs ordered are listed, but only abnormal results are displayed) Labs Reviewed - No data to display  EKG None  Radiology DG Shoulder Left  Result Date: 09/20/2019 CLINICAL DATA:  Fall with shoulder pain EXAM: LEFT SHOULDER - 2+  VIEW COMPARISON:  None. FINDINGS: No fracture or malalignment. Mild AC joint and glenohumeral degenerative change. IMPRESSION: No acute osseous abnormality Electronically Signed   By: Donavan Foil M.D.   On: 09/20/2019 18:04   DG Finger Thumb Left  Result Date: 09/20/2019 CLINICAL DATA:  Thumb laceration EXAM: LEFT THUMB 2+V COMPARISON:  Hand radiograph 03/20/2010 FINDINGS: No acute displaced fracture or malalignment. No radiopaque foreign body. Degenerative change at the first MCP joint. IMPRESSION: No acute osseous abnormality. Electronically Signed   By: Donavan Foil M.D.   On: 09/20/2019 18:05    Procedures .Marland KitchenLaceration Repair  Date/Time: 09/20/2019 10:25 PM Performed by: Kem Parkinson, PA-C Authorized by: Kem Parkinson, PA-C   Consent:    Consent obtained:  Verbal   Consent given by:  Patient   Risks discussed:  Pain, infection, poor wound healing and nerve damage Anesthesia (see MAR for exact dosages):    Anesthesia method:  Local infiltration   Local anesthetic:  Lidocaine 1% w/o epi Laceration details:    Location:  Finger   Finger location:  L thumb   Length (cm):  3   Depth (mm):  2 Repair type:    Repair type:  Simple Pre-procedure details:    Preparation:  Patient was prepped and draped in usual sterile fashion Exploration:    Hemostasis achieved with:  Direct pressure   Wound exploration: wound explored through full range of motion and entire depth of wound probed and visualized     Contaminated: no   Treatment:    Area cleansed with:  Betadine   Amount of cleaning:  Standard   Irrigation solution:  Sterile saline   Irrigation method:  Syringe   Visualized foreign bodies/material removed: no   Skin repair:    Repair method:  Sutures   Suture size:  4-0   Suture material:  Prolene   Suture technique:  Simple interrupted   Number of sutures:  5 Approximation:    Approximation:  Close Post-procedure details:    Dressing:  Non-adherent dressing and bulky  dressing   Patient tolerance of procedure:  Tolerated well, no immediate complications   (including critical care time)  Medications Ordered in ED Medications  povidone-iodine (BETADINE) 10 % external solution (1 application Topical Given 09/20/19 2047)  acetaminophen (TYLENOL) tablet 650 mg (650 mg Oral Given 09/20/19 2047)  lidocaine (PF) (XYLOCAINE) 1 % injection 5 mL (5 mLs Intradermal Given 09/20/19 2047)    ED Course  I have reviewed the triage vital signs and the nursing notes.  Pertinent labs & imaging results that were available during my care of the patient were reviewed by me and considered in my medical decision making (see chart for details).    MDM Rules/Calculators/A&P                      Patient here with left shoulder injury, neurovascularly intact.  He has full range of motion.  X-ray is negative for bony injury or dislocation. No cervical tenderness, head injury or LOC.   He also has an abrasion of the right wrist and laceration of the left thumb.  X-ray of the left thumb is negative for bony injury.  Neurovascularly intact.  Wound cleaned and approximated with sutures.  He agrees to wound care instructions and sutures out in 8 to 10 days.  TD is up-to-date.   Final Clinical Impression(s) / ED Diagnoses Final diagnoses:  Fall, initial encounter  Laceration of left thumb without foreign body without damage to nail, initial encounter  Shoulder strain, left, initial encounter    Rx / DC Orders ED Discharge Orders    None       Bufford Lope 09/21/19 1229    Milton Ferguson, MD 09/23/19 [phone]

## 2019-09-20 NOTE — Discharge Instructions (Addendum)
The x-rays of your shoulder and thumb this evening are negative for dislocation or broken bone.  You likely have muscular or ligament this injury of your shoulder.  Apply ice packs on and off to your shoulder.  Elevate your hand when possible.  Clean the wound with mild soap and water and keep it bandaged.  Sutures out in 8 to 10 days.  Return to emergency department for any signs of infection.  You may contact the orthopedic provider listed to arrange a follow-up appointment regarding your shoulder.

## 2019-09-28 ENCOUNTER — Encounter (HOSPITAL_COMMUNITY): Payer: Self-pay | Admitting: Emergency Medicine

## 2019-09-28 ENCOUNTER — Other Ambulatory Visit: Payer: Self-pay

## 2019-09-28 ENCOUNTER — Emergency Department (HOSPITAL_COMMUNITY)
Admission: EM | Admit: 2019-09-28 | Discharge: 2019-09-28 | Disposition: A | Discharge status: Home / Self Care | Payer: Medicare Other | Attending: Emergency Medicine | Admitting: Emergency Medicine

## 2019-09-28 DIAGNOSIS — S61012D Laceration without foreign body of left thumb without damage to nail, subsequent encounter: Secondary | ICD-10-CM | POA: Diagnosis not present

## 2019-09-28 DIAGNOSIS — Z4802 Encounter for removal of sutures: Secondary | ICD-10-CM | POA: Diagnosis not present

## 2019-09-28 NOTE — ED Triage Notes (Signed)
Patient is here for stiches removal

## 2019-09-28 NOTE — Discharge Instructions (Signed)
Make sure to return to the ER if you have any increase in redness, swelling, discharge, pus, fevers, etc.

## 2019-09-28 NOTE — ED Provider Notes (Signed)
[facility] Provider Note   CSN: [id number] Arrival date & time: 09/28/19  1057     History Chief Complaint  Patient presents with  . Suture / Staple Removal    [facility] is a 69 y.o. male.  HPI 69 year old male with laceration to left thumb, was seen in the ER on 09/20/2019 with sutures presents for suture removal.  Tolerated the procedure well, denies any redness, swelling, drainage, fevers, chills, numbness, tingling, weakness in the thumb.    Past Medical History:  Diagnosis Date  . A-fib ([place])   . Acute blood loss anemia 01/26/2015  . Allergic rhinitis   . Diverticulosis of colon   . Dysplastic nevus   . GERD (gastroesophageal reflux disease)   . GI bleeding 01/24/2015  . Hypercholesterolemia   . Postural dizziness with near syncope due to dehydration 12/09/2016  . RA (rheumatoid arthritis) [facility])     Patient Active Problem List   Diagnosis Date Noted  . Hyperglycemia 04/05/2019  . Family history of prostate cancer 04/03/2019  . GERD (gastroesophageal reflux disease), on Omeprazole 07/01/2018  . History of GI bleed 07/01/2018  . Insomnia 07/01/2018  . Left inguinal hernia, fat filled, with no complications 123456  . Abnormal CT of the chest 03/03/2018  . Sensorineural hearing loss (SNHL), bilateral 12/28/2017  . Tinnitus, bilateral 12/28/2017  . Pulmonary nodule, left 12/09/2016  . Former smoker 01/16/2015  . Atrial fibrillation ([place]), on Cardizem daily, with prn ASA and Diltiazem 04/25/2012  . Rheumatoid arthritis ([place]), on Enbrel, followed by Dr. Amil Amen 01/06/2011  . Allergic rhinitis 04/22/2009  . Dyslipidemia 10/24/2008  . Diverticulosis of large intestine 06/21/2007    Past Surgical History:  Procedure Laterality Date  . COLONOSCOPY  01/24/2015   x 3  . ERCP N/A 02/18/2015   Procedure: ENDOSCOPIC RETROGRADE CHOLANGIOPANCREATOGRAPHY (ERCP);  Surgeon: Carol Ada, MD;  Location: [facility];  Service: Endoscopy;   Laterality: N/A;  . GIVENS CAPSULE STUDY N/A 01/24/2015   Procedure: GIVENS CAPSULE STUDY;  Surgeon: Juanita Craver, MD;  Location: [place];  Service: Endoscopy;  Laterality: N/A;  . INGUINAL HERNIA REPAIR Right 07/27/2019   Procedure: LAPAROSCOPIC RIGHT INGUINAL HERNIA REPAIR WITH MESH;  Surgeon: Ralene Ok, MD;  Location: [place];  Service: General;  Laterality: Right;  . LAPAROSCOPIC CHOLECYSTECTOMY  2011  . TOE AMPUTATION  1966  . TONSILLECTOMY AND ADENOIDECTOMY         Family History  Problem Relation Age of Onset  . Prostate cancer Father   . Heart disease Mother   . Rheum arthritis Mother     Social History   Tobacco Use  . Smoking status: Former Smoker    Packs/day: 0.50    Years: 45.00    Pack years: 22.50    Types: Cigarettes    Quit date: 11/13/2009    Years since quitting: 9.8  . Smokeless tobacco: Never Used  Substance Use Topics  . Alcohol use: No    Alcohol/week: 0.0 standard drinks  . Drug use: No    Home Medications Prior to Admission medications   Medication Sig Start Date End Date Taking? Authorizing Provider  acetaminophen (TYLENOL) 160 MG/5ML liquid Take 325 mg by mouth every 4 (four) hours as needed for pain.    [provider]  aspirin EC 81 MG tablet Take 81 mg by mouth daily as needed (afib).     [provider]  clotrimazole (LOTRIMIN) 1 % cream Apply 1 application topically daily as needed (jock  itch).    [provider]  diltiazem (CARDIZEM CD) 120 MG 24 hr capsule Take 1 capsule (120 mg total) by mouth daily. 09/14/19   Evans Lance, MD  docusate sodium (COLACE) 100 MG capsule Take 100 mg by mouth daily.    [provider]  etanercept (ENBREL) 50 MG/ML injection Inject 50 mg into the skin every Monday.     [provider]  ibuprofen (ADVIL,MOTRIN) 200 MG tablet Take 600 mg by mouth every 8 (eight) hours as needed for moderate pain.     [provider]  LORazepam (ATIVAN) 1 MG tablet  Take 1 tablet (1 mg total) by mouth every 8 (eight) hours as needed for anxiety or sleep. Patient taking differently: Take 0.5 mg by mouth at bedtime.  04/03/19   [name], MD  Multiple Vitamin (MULTIVITAMIN) tablet Take 1 tablet by mouth daily.    [provider]  neomycin-bacitracin-polymyxin (NEOSPORIN) ointment Apply 1 application topically as needed for wound care.    [provider]  omeprazole (PRILOSEC) 40 MG capsule Take 40 mg by mouth every evening.     [provider]  traMADol (ULTRAM) 50 MG tablet Take 1 tablet (50 mg total) by mouth every 6 (six) hours as needed. 07/27/19 07/26/20  Ralene Ok, MD  vitamin C (ASCORBIC ACID) 500 MG tablet Take 500 mg by mouth daily.     [provider]    Allergies    Caffeine, Flecainide, Methotrexate derivatives, Pseudoephedrine-ibuprofen, and Penicillins  Review of Systems   Review of Systems  Constitutional: Negative for chills and fever.  Skin: Positive for wound. Negative for color change, pallor and rash.  Neurological: Negative for weakness and numbness.  Hematological: Does not bruise/bleed easily.    Physical Exam Updated Vital Signs BP 132/76 (BP Location: Right Arm)   Pulse 63   Temp 98.8 F (37.1 C) (Oral)   Resp 17   Ht 5\' 11"  (1.803 m)   Wt 90.7 kg   SpO2 100%   BMI 27.89 kg/m   Physical Exam Constitutional:      General: He is not in acute distress.    Appearance: Normal appearance. He is not ill-appearing, toxic-appearing or diaphoretic.  Musculoskeletal:        General: No tenderness or deformity.  Skin:    Findings: Lesion present. No bruising, erythema or rash.     Comments: Well approximated healing laceration on the medial side of left thumb.  No evidence of erythema, drainage, swelling, fluctuance, induration.  No sensory deficit.  5 out of 5 thumb strength.  Neurological:     Mental Status: He is alert.     Sensory: No sensory deficit.     Motor: No weakness.      ED Results / Procedures / Treatments   Labs (all labs ordered are listed, but only abnormal results are displayed) Labs Reviewed - No data to display  EKG None  Radiology No results found.  Procedures .Suture Removal  Date/Time: 09/28/2019 11:58 AM Performed by: Garald Balding, PA-C Authorized by: Garald Balding, PA-C   Consent:    Consent obtained:  Verbal   Consent given by:  Patient   Risks discussed:  Bleeding, pain and wound separation   Alternatives discussed:  No treatment Location:    Location:  Upper extremity   Upper extremity location:  Hand   Hand location:  L thumb Procedure details:    Wound appearance:  No signs of infection, clean,  red and nontender   Number of sutures removed:  5 Post-procedure details:    Post-removal:  Steri-Strips applied and dressing applied   Patient tolerance of procedure:  Tolerated well, no immediate complications   (including critical care time)  Medications Ordered in ED Medications - No data to display  ED Course  I have reviewed the triage vital signs and the nursing notes.  Pertinent labs & imaging results that were available during my care of the patient were reviewed by me and considered in my medical decision making (see chart for details).    MDM Rules/Calculators/A&P                      Staple removal   Pt to ER for staple/suture removal and wound check as above. Procedure tolerated well. Vitals normal, no signs of infection. Scar minimization & return precautions given at dc.   Final Clinical Impression(s) / ED Diagnoses Final diagnoses:  Visit for suture removal    Rx / DC Orders ED Discharge Orders    None       Garald Balding, PA-C 09/28/19 1204    Elnora Morrison, MD 09/29/19 1711

## 2019-10-22 ENCOUNTER — Other Ambulatory Visit: Payer: Self-pay | Admitting: Family Medicine

## 2019-10-23 NOTE — Telephone Encounter (Signed)
LAST APPOINTMENT DATE: 06/21/2019   NEXT APPOINTMENT DATE: 04/03/2020   Rx: Ativan  LAST REFILL: 04/13/2019  QTY: 90 1 rf

## 2019-10-26 ENCOUNTER — Encounter: Payer: Self-pay | Admitting: Family Medicine

## 2019-10-26 ENCOUNTER — Other Ambulatory Visit: Payer: Self-pay

## 2019-10-26 ENCOUNTER — Ambulatory Visit (INDEPENDENT_AMBULATORY_CARE_PROVIDER_SITE_OTHER): Payer: Medicare Other | Admitting: Family Medicine

## 2019-10-26 VITALS — BP 122/64 | HR 67 | Temp 97.3°F | Resp 18 | Ht 71.0 in | Wt 206.0 lb

## 2019-10-26 DIAGNOSIS — S30860A Insect bite (nonvenomous) of lower back and pelvis, initial encounter: Secondary | ICD-10-CM | POA: Diagnosis not present

## 2019-10-26 DIAGNOSIS — W57XXXA Bitten or stung by nonvenomous insect and other nonvenomous arthropods, initial encounter: Secondary | ICD-10-CM | POA: Diagnosis not present

## 2019-10-26 MED ORDER — DOXYCYCLINE HYCLATE 100 MG PO TABS: 100.0000 mg | ORAL_TABLET | Freq: Two times a day (BID) | ORAL | 0 refills | Status: AC

## 2019-10-26 NOTE — Patient Instructions (Addendum)
Please follow up if symptoms do not improve or as needed.   Preventive doxy is recommended for tick bites (deer ticks) that have been attached for > 36 hours, are engorged upon removal, and if given before 72 hours has passed since bite.  Doxycycline can be given in a 200mg  one time dose.   You may discuss these things with Dr. [name]; hopefully if you are able to remove most ticks within 24 hours you will be able to avoid unnecessary antibiotics. Taking the one time 200mg  dose may also be a good option.   Take care and it was a pleasure meeting you.

## 2019-10-26 NOTE — Progress Notes (Signed)
Subjective  CC:  Chief Complaint  Patient presents with  . Tick Removal    Deer tick was right under his right butt cheek. Concerned about lyme disease. Pulled the dog ticket off his left upper arm.    Same day acute visit; PCP not available. New pt to me. Chart reviewed.   HPI: William West is a 69 y.o. male who presents to the office today to address the problems listed above in the chief complaint.  69 yo male with small deer tick removed this am; believes got it yesterday while out in yard. Earlier in week removed a larger dog tick from upper arm. Feels fine. Typically will take doxy preventively with any deer ticks. He does all of the preventive measures. He has friends who had lymes disease. No rash or fever or joint pains. Has h/o RMSF as a child    Assessment  1. Tick bite, initial encounter      Plan   Tick bite:  Deer tick, ? Likely < 36 hours exposure, engorged. Pt prefers abx use. Doxy x 7 days given.   Gave information on indications for prophylaxis: since he gets multiple ticks/year: would consider giving abx only if > 36 hours exposure, deer tick and can use the 200mg  one time dose (up to date data). He will research and discuss with his pcp to see if can limit his exposure to full dose doxy.   Follow up: prn  04/03/2020  No orders of the defined types were placed in this encounter.  Meds ordered this encounter  Medications  . doxycycline (VIBRA-TABS) 100 MG tablet    Sig: Take 1 tablet (100 mg total) by mouth 2 (two) times daily for 7 days.    Dispense:  14 tablet    Refill:  0      I reviewed the patients updated PMH, FH, and SocHx.    Patient Active Problem List   Diagnosis Date Noted  . Hyperglycemia 04/05/2019  . Family history of prostate cancer 04/03/2019  . GERD (gastroesophageal reflux disease), on Omeprazole 07/01/2018  . History of GI bleed 07/01/2018  . Insomnia 07/01/2018  . Left inguinal hernia, fat filled, with no complications  [id number]/[id number]  . Abnormal CT of the chest 03/03/2018  . Sensorineural hearing loss (SNHL), bilateral 12/28/2017  . Tinnitus, bilateral 12/28/2017  . Pulmonary nodule, left 12/09/2016  . Former smoker 01/16/2015  . Atrial fibrillation ([place]), on Cardizem daily, with prn ASA and Diltiazem 04/25/2012  . Rheumatoid arthritis ([place]), on Enbrel, followed by Dr. Amil Amen 01/06/2011  . Allergic rhinitis 04/22/2009  . Dyslipidemia 10/24/2008  . Diverticulosis of large intestine 06/21/2007   Current Meds  Medication Sig  . acetaminophen (TYLENOL) 160 MG/5ML liquid Take 325 mg by mouth every 4 (four) hours as needed for pain.  . clotrimazole (LOTRIMIN) 1 % cream Apply 1 application topically daily as needed (jock itch).  [name] diltiazem (CARDIZEM CD) 120 MG 24 hr capsule Take 1 capsule (120 mg total) by mouth daily.  [name] docusate sodium (COLACE) 100 MG capsule Take 100 mg by mouth as needed.   . etanercept (ENBREL) 50 MG/ML injection Inject 50 mg into the skin every Monday.   [name] ibuprofen (ADVIL,MOTRIN) 200 MG tablet Take 600 mg by mouth every 8 (eight) hours as needed for moderate pain.   [name] LORazepam (ATIVAN) 1 MG tablet TAKE 1 TABLET BY MOUTH AT BEDTIME (Patient taking differently: 0.5 mg. )  . Multiple Vitamin (MULTIVITAMIN) tablet Take 1 tablet  by mouth daily.  [name] neomycin-bacitracin-polymyxin (NEOSPORIN) ointment Apply 1 application topically as needed for wound care.  [name] omeprazole (PRILOSEC) 40 MG capsule Take 40 mg by mouth every evening.   . vitamin C (ASCORBIC ACID) 500 MG tablet Take 500 mg by mouth daily.     Allergies: Patient is allergic to caffeine; flecainide; methotrexate derivatives; pseudoephedrine-ibuprofen; and penicillins. Family History: Patient family history includes Heart disease in his mother; Prostate cancer in his father; Rheum arthritis in his mother. Social History:  Patient  reports that he quit smoking about 9 years ago. His smoking use included cigarettes. He has a 22.50  pack-year smoking history. He has never used smokeless tobacco. He reports that he does not drink alcohol or use drugs.  Review of Systems: Constitutional: Negative for fever malaise or anorexia Cardiovascular: negative for chest pain Respiratory: negative for SOB or persistent cough Gastrointestinal: negative for abdominal pain  Objective  Vitals: BP 122/64   Pulse 67   Temp (!) 97.3 F (36.3 C) (Temporal)   Resp 18   Ht 5\' 11"  (1.803 m)   Wt 206 lb (93.4 kg)   SpO2 95%   BMI 28.73 kg/m  General: no acute distress , A&Ox3 Skin:  Warm, no rashes, small reddened area on posterior upper left arm. No rashes.      Commons side effects, risks, benefits, and alternatives for medications and treatment plan prescribed today were discussed, and the patient expressed understanding of the given instructions. Patient is instructed to call or message via MyChart if he/she has any questions or concerns regarding our treatment plan. No barriers to understanding were identified. We discussed Red Flag symptoms and signs in detail. Patient expressed understanding regarding what to do in case of urgent or emergency type symptoms.   Medication list was reconciled, printed and provided to the patient in AVS. Patient instructions and summary information was reviewed with the patient as documented in the AVS. This note was prepared with assistance of Dragon voice recognition software. Occasional wrong-word or sound-a-like substitutions may have occurred due to the inherent limitations of voice recognition software  This visit occurred during the SARS-CoV-2 public health emergency.  Safety protocols were in place, including screening questions prior to the visit, additional usage of staff PPE, and extensive cleaning of exam room while observing appropriate contact time as indicated for disinfecting solutions.

## 2019-11-24 ENCOUNTER — Other Ambulatory Visit: Payer: Self-pay | Admitting: Family Medicine

## 2019-11-27 NOTE — Telephone Encounter (Signed)
Pt requesting refill on Lorazepam. Last OV 05/2019

## 2019-12-22 ENCOUNTER — Other Ambulatory Visit: Payer: Self-pay | Admitting: Family Medicine

## 2019-12-24 ENCOUNTER — Telehealth: Payer: Self-pay | Admitting: Emergency Medicine

## 2019-12-24 ENCOUNTER — Telehealth: Payer: Self-pay

## 2019-12-24 DIAGNOSIS — R911 Solitary pulmonary nodule: Secondary | ICD-10-CM

## 2019-12-24 NOTE — Telephone Encounter (Signed)
Order for CT was placed. Pt's wife was notified that [facility] will contact them about setting up scan. Nothing further needed at this time.

## 2019-12-24 NOTE — Telephone Encounter (Signed)
LR: 11-27-2019 Qty: 30 w 0 Last office visit: 10-26-2019 Jonni Sanger) Upcoming appointment: 04-03-2020

## 2019-12-24 NOTE — Telephone Encounter (Signed)
Patient office notes mention repeat CT scan for August 2021. Patient calling to schedule but no order placed. Please advise type of scan and diagnosis.

## 2019-12-24 NOTE — Telephone Encounter (Signed)
Pt was around his granddaughter all last week. She ended up being diagnosed with pinworm. Pt wants to know if he needs to be treated.

## 2019-12-24 NOTE — Telephone Encounter (Signed)
Yes he needs a CT chest without contrast for pulmonary nodules to be done in August.  Then follow-up with me to review

## 2019-12-24 NOTE — Telephone Encounter (Signed)
Yes he should be treated. Please send in albendazole 400mg  take once as a single dose and then repeat in 2 weeks.  Algis Greenhouse. [name], MD 12/24/2019 10:41 AM

## 2019-12-25 MED ORDER — ALBENDAZOLE 200 MG PO TABS: 400.0000 mg | ORAL_TABLET | Freq: Once | ORAL | 0 refills | Status: AC

## 2019-12-25 NOTE — Telephone Encounter (Signed)
Medication has been sent to the patient's pharmacy. Patient has been notified via mychart about his medication been sent in.

## 2020-01-07 ENCOUNTER — Ambulatory Visit
Admission: RE | Admit: 2020-01-07 | Discharge: 2020-01-07 | Disposition: A | Discharge status: Home / Self Care | Payer: Medicare Other | Source: Ambulatory Visit | Attending: Emergency Medicine | Admitting: Emergency Medicine

## 2020-01-07 ENCOUNTER — Other Ambulatory Visit: Payer: Self-pay

## 2020-01-07 DIAGNOSIS — I2510 Atherosclerotic heart disease of native coronary artery without angina pectoris: Secondary | ICD-10-CM | POA: Diagnosis not present

## 2020-01-07 DIAGNOSIS — R911 Solitary pulmonary nodule: Secondary | ICD-10-CM

## 2020-01-07 DIAGNOSIS — M47816 Spondylosis without myelopathy or radiculopathy, lumbar region: Secondary | ICD-10-CM | POA: Diagnosis not present

## 2020-01-07 DIAGNOSIS — R918 Other nonspecific abnormal finding of lung field: Secondary | ICD-10-CM | POA: Diagnosis not present

## 2020-01-07 DIAGNOSIS — I700 Atherosclerosis of aorta: Secondary | ICD-10-CM | POA: Diagnosis not present

## 2020-01-07 DIAGNOSIS — Z23 Encounter for immunization: Secondary | ICD-10-CM | POA: Diagnosis not present

## 2020-01-15 DIAGNOSIS — E663 Overweight: Secondary | ICD-10-CM | POA: Diagnosis not present

## 2020-01-15 DIAGNOSIS — Z6827 Body mass index (BMI) 27.0-27.9, adult: Secondary | ICD-10-CM | POA: Diagnosis not present

## 2020-01-15 DIAGNOSIS — M0579 Rheumatoid arthritis with rheumatoid factor of multiple sites without organ or systems involvement: Secondary | ICD-10-CM | POA: Diagnosis not present

## 2020-01-15 DIAGNOSIS — Z79899 Other long term (current) drug therapy: Secondary | ICD-10-CM | POA: Diagnosis not present

## 2020-02-04 DIAGNOSIS — D2272 Melanocytic nevi of left lower limb, including hip: Secondary | ICD-10-CM | POA: Diagnosis not present

## 2020-02-04 DIAGNOSIS — L821 Other seborrheic keratosis: Secondary | ICD-10-CM | POA: Diagnosis not present

## 2020-02-04 DIAGNOSIS — D2262 Melanocytic nevi of left upper limb, including shoulder: Secondary | ICD-10-CM | POA: Diagnosis not present

## 2020-02-04 DIAGNOSIS — L720 Epidermal cyst: Secondary | ICD-10-CM | POA: Diagnosis not present

## 2020-02-04 DIAGNOSIS — D1801 Hemangioma of skin and subcutaneous tissue: Secondary | ICD-10-CM | POA: Diagnosis not present

## 2020-02-04 DIAGNOSIS — L918 Other hypertrophic disorders of the skin: Secondary | ICD-10-CM | POA: Diagnosis not present

## 2020-02-04 DIAGNOSIS — L570 Actinic keratosis: Secondary | ICD-10-CM | POA: Diagnosis not present

## 2020-02-04 DIAGNOSIS — L814 Other melanin hyperpigmentation: Secondary | ICD-10-CM | POA: Diagnosis not present

## 2020-02-11 ENCOUNTER — Other Ambulatory Visit: Payer: Self-pay

## 2020-02-11 ENCOUNTER — Ambulatory Visit (INDEPENDENT_AMBULATORY_CARE_PROVIDER_SITE_OTHER): Payer: Medicare Other | Admitting: Emergency Medicine

## 2020-02-11 ENCOUNTER — Encounter: Payer: Self-pay | Admitting: Emergency Medicine

## 2020-02-11 VITALS — BP 114/70 | HR 74 | Temp 97.4°F | Ht 71.0 in | Wt 203.0 lb

## 2020-02-11 DIAGNOSIS — R9389 Abnormal findings on diagnostic imaging of other specified body structures: Secondary | ICD-10-CM

## 2020-02-11 DIAGNOSIS — R911 Solitary pulmonary nodule: Secondary | ICD-10-CM

## 2020-02-11 DIAGNOSIS — Z87891 Personal history of nicotine dependence: Secondary | ICD-10-CM

## 2020-02-11 DIAGNOSIS — Z23 Encounter for immunization: Secondary | ICD-10-CM | POA: Diagnosis not present

## 2020-02-11 NOTE — Patient Instructions (Addendum)
We will plan to follow-up in 1 year with a chest x-ray.  Please call if you have any problems, any change in your breathing, and we will see you sooner. Your pneumonia shots and your Covid shots are up-to-date Please continue to practice good social distancing and wearing a mask, wash hands You need the flu shot today.

## 2020-02-11 NOTE — Progress Notes (Signed)
   Subjective:    Patient ID: William West, male    DOB: 1950-07-05, 69 y.o.   MRN: [id number]  HPI  ROV 02/11/20 --69 year old man, former smoker, with mild COPD not currently on bronchodilator therapy also with rheumatoid arthritis on weekly Enbrel, previously on methotrexate.  He has pulmonary nodular disease that we have been following with serial CT scans of the chest.  These have been stable going back to July 2018.  He had a repeat scan done on 01/07/2020 which I have reviewed, shows that his 7 x 9 mm left lower lobe nodule is grossly unchanged since 2018, central left upper lobe nodule no longer cavitary again unchanged.   He is feeling well. No CP, does have some mild daily cough. Remains active.  COVID vaccine and pneumonia vaccines up to date. Due for flu shot.   Review of Systems As per HPI     Objective:   Physical Exam Vitals:   02/11/20 1138  BP: 114/70  Pulse: 74  Temp: (!) 97.4 F (36.3 C)  TempSrc: Temporal  SpO2: 93%  Weight: 203 lb (92.1 kg)  Height: 5\' 11"  (1.803 m)   Gen: Pleasant, well-nourished, in no distress,  normal affect  ENT: No lesions,  mouth clear,  oropharynx clear, no postnasal drip  Neck: No JVD, no stridor  Lungs: No use of accessory muscles, no crackles or wheezing on normal respiration, no wheeze on forced expiration  Cardiovascular: RRR, heart sounds normal, no murmur or gallops, no peripheral edema  Musculoskeletal: No deformities, no cyanosis or clubbing  Neuro: alert, awake, non focal  Skin: Warm, no lesions or rash      Assessment & Plan:  Abnormal CT of the chest His pulmonary nodules are stable in size and appearance, no evidence for malignancy.  Likely related to rheumatoid.  They have been stable since 2018 I do not feel that we need to schedule follow-up CT at this time unless he has a clinical change.  Should be able to space out his surveillance, just check a chest x-ray next time.  We will plan to follow-up in 1  year with a chest x-ray.  Please call if you have any problems, any change in your breathing, and we will see you sooner. Your pneumonia shots and your Covid shots are up-to-date Please continue to practice good social distancing and wearing a mask, wash hands You need the flu shot today.  Former smoker With suspected mild COPD.  He is asymptomatic, has never needed bronchodilators.  We will discuss a possible bronchodilator regimen if there is a clinical change.  Baltazar Apo, MD, PhD 02/11/2020, 12:05 PM [facility] [phone] or if no answer [phone]

## 2020-02-11 NOTE — Assessment & Plan Note (Signed)
His pulmonary nodules are stable in size and appearance, no evidence for malignancy.  Likely related to rheumatoid.  They have been stable since 2018 I do not feel that we need to schedule follow-up CT at this time unless he has a clinical change.  Should be able to space out his surveillance, just check a chest x-ray next time.  We will plan to follow-up in 1 year with a chest x-ray.  Please call if you have any problems, any change in your breathing, and we will see you sooner. Your pneumonia shots and your Covid shots are up-to-date Please continue to practice good social distancing and wearing a mask, wash hands You need the flu shot today.

## 2020-02-11 NOTE — Assessment & Plan Note (Signed)
With suspected mild COPD.  He is asymptomatic, has never needed bronchodilators.  We will discuss a possible bronchodilator regimen if there is a clinical change.

## 2020-02-19 ENCOUNTER — Telehealth: Payer: Self-pay

## 2020-02-19 NOTE — Telephone Encounter (Signed)
Patient is calling in asking if it would be okay to receive the shingrix vaccine as he has rheumatoid arthritis and has not been able to get a hold of his rheumatologist to ask them.

## 2020-02-20 NOTE — Telephone Encounter (Signed)
LVM to return call.

## 2020-02-20 NOTE — Telephone Encounter (Signed)
Yes it is fine for him to get the shingles vaccines.

## 2020-02-20 NOTE — Telephone Encounter (Signed)
See below

## 2020-03-12 ENCOUNTER — Encounter: Payer: Self-pay | Admitting: Internal Medicine

## 2020-03-12 ENCOUNTER — Ambulatory Visit (INDEPENDENT_AMBULATORY_CARE_PROVIDER_SITE_OTHER): Payer: Medicare Other | Admitting: Internal Medicine

## 2020-03-12 ENCOUNTER — Other Ambulatory Visit: Payer: Self-pay

## 2020-03-12 VITALS — BP 120/78 | HR 63 | Ht 71.0 in | Wt 201.0 lb

## 2020-03-12 DIAGNOSIS — I480 Paroxysmal atrial fibrillation: Secondary | ICD-10-CM | POA: Diagnosis not present

## 2020-03-12 MED ORDER — DILTIAZEM HCL ER COATED BEADS 120 MG PO CP24: 120.0000 mg | ORAL_CAPSULE | Freq: Every day | ORAL | 1 refills | Status: DC

## 2020-03-12 NOTE — Patient Instructions (Signed)
Medication Instructions:  Your physician recommends that you continue on your current medications as directed. Please refer to the Current Medication list given to you today.  *If you need a refill on your cardiac medications before your next appointment, please call your pharmacy*   Lab Work: NONE   If you have labs (blood work) drawn today and your tests are completely normal, you will receive your results only by: . MyChart Message (if you have MyChart) OR . A paper copy in the mail If you have any lab test that is abnormal or we need to change your treatment, we will call you to review the results.   Testing/Procedures: NONE    Follow-Up: At [facility], you and your health needs are our priority.  As part of our continuing mission to provide you with exceptional heart care, we have created designated Provider Care Teams.  These Care Teams include your primary Cardiologist (physician) and Advanced Practice Providers (APPs -  Physician Assistants and Nurse Practitioners) who all work together to provide you with the care you need, when you need it.  We recommend signing up for the patient portal called "MyChart".  Sign up information is provided on this After Visit Summary.  MyChart is used to connect with patients for Virtual Visits (Telemedicine).  Patients are able to view lab/test results, encounter notes, upcoming appointments, etc.  Non-urgent messages can be sent to your provider as well.   To learn more about what you can do with MyChart, go to [email or web address].    Your next appointment:   1 year(s)  The format for your next appointment:   In Person  Provider:   Gregg Taylor, MD   Other Instructions Thank you for choosing [facility]!    

## 2020-03-12 NOTE — Progress Notes (Signed)
HPI William West today for follow-up and for evaluation of syncope. He is a pleasant 69 year old man with paroxysmal atrial fibrillation, and relative intolerance to flecainide. The patient has been well-controlled with regard to his atrial fibrillation in the past.He has had episodes of syncope which were thought to be mostly related to autonomic dysfunction. He has had some GI bleeding in the past. He feels well. He has had 3 episodes of atrial fib over the past year. Allergies  Allergen Reactions  . Caffeine Other (See Comments)    Causes him to go into afib  . Flecainide Other (See Comments)    Caused heart to race too fast and had him in the ED  . Methotrexate Derivatives Nausea And Vomiting    Increased liver enzymes  . Pseudoephedrine-Ibuprofen Other (See Comments)    " afib"   . Penicillins Rash    Did it involve swelling of the face/tongue/throat, SOB, or low BP? No Did it involve sudden or severe rash/hives, skin peeling, or any reaction on the inside of your mouth or nose? No Did you need to seek medical attention at a hospital or doctor's office? No When did it last happen?40 + years If all above answers are "NO", may proceed with cephalosporin use.      Current Outpatient Medications  Medication Sig Dispense Refill  . acetaminophen (TYLENOL) 160 MG/5ML liquid Take 325 mg by mouth every 4 (four) hours as needed for pain.    [name] aspirin EC 81 MG tablet Take 81 mg by mouth daily as needed (afib).     . clotrimazole (LOTRIMIN) 1 % cream Apply 1 application topically daily as needed (jock itch).    [name] diltiazem (CARDIZEM CD) 120 MG 24 hr capsule Take 1 capsule (120 mg total) by mouth daily. 90 capsule 1  . docusate sodium (COLACE) 100 MG capsule Take 100 mg by mouth as needed.     . etanercept (ENBREL) 50 MG/ML injection Inject 50 mg into the skin every Monday.     [name] ibuprofen (ADVIL,MOTRIN) 200 MG tablet Take 600 mg by mouth every 8 (eight) hours as  needed for moderate pain.     [name] LORazepam (ATIVAN) 1 MG tablet TAKE 1 TABLET BY MOUTH AT BEDTIME 30 tablet 5  . Multiple Vitamin (MULTIVITAMIN) tablet Take 1 tablet by mouth daily.    [name] neomycin-bacitracin-polymyxin (NEOSPORIN) ointment Apply 1 application topically as needed for wound care.    [name] omeprazole (PRILOSEC) 40 MG capsule Take 40 mg by mouth every evening.     . vitamin C (ASCORBIC ACID) 500 MG tablet Take 500 mg by mouth daily.      No current facility-administered medications for this visit.     Past Medical History:  Diagnosis Date  . A-fib ([place])   . Acute blood loss anemia 01/26/2015  . Allergic rhinitis   . Diverticulosis of colon   . Dysplastic nevus   . GERD (gastroesophageal reflux disease)   . GI bleeding 01/24/2015  . Hypercholesterolemia   . Postural dizziness with near syncope due to dehydration 12/09/2016  . RA (rheumatoid arthritis) ([facility])     ROS:   All systems reviewed and negative except as noted in the HPI.   Past Surgical History:  Procedure Laterality Date  . COLONOSCOPY  01/24/2015   x 3  . ERCP N/A 02/18/2015   Procedure: ENDOSCOPIC RETROGRADE CHOLANGIOPANCREATOGRAPHY (ERCP);  Surgeon: Carol Ada, MD;  Location: [facility];  Service: Endoscopy;  Laterality: N/A;  .  GIVENS CAPSULE STUDY N/A 01/24/2015   Procedure: GIVENS CAPSULE STUDY;  Surgeon: Juanita Craver, MD;  Location: [facility];  Service: Endoscopy;  Laterality: N/A;  . INGUINAL HERNIA REPAIR Right 07/27/2019   Procedure: LAPAROSCOPIC RIGHT INGUINAL HERNIA REPAIR WITH MESH;  Surgeon: Ralene Ok, MD;  Location: [facility];  Service: General;  Laterality: Right;  . LAPAROSCOPIC CHOLECYSTECTOMY  2011  . TOE AMPUTATION  1966  . TONSILLECTOMY AND ADENOIDECTOMY       Family History  Problem Relation Age of Onset  . Prostate cancer Father   . Heart disease Mother   . Rheum arthritis Mother      Social History   Socioeconomic History  . Marital status: Married    Spouse name: William West    . Number of children: 2  . Years of education: Not on file  . Highest education level: Not on file  Occupational History  . Occupation: [employer]    Comment: RETIRED  Tobacco Use  . Smoking status: Former Smoker    Packs/day: 0.50    Years: 45.00    Pack years: 22.50    Types: Cigarettes    Quit date: 11/13/2009    Years since quitting: 10.3  . Smokeless tobacco: Never Used  Vaping Use  . Vaping Use: Never used  Substance and Sexual Activity  . Alcohol use: No    Alcohol/week: 0.0 standard drinks  . Drug use: No  . Sexual activity: Never  Other Topics Concern  . Not on file  Social History Narrative   One son with HBP. One son with IDDM. Exercises regularly at the [facility]. Eats well. Down 30 pounds with the lifestyle change. Exercise keeps him from feeling so stiff. Weighs daily. Baseline 199-204.    Social Determinants of Health   Financial Resource Strain:   . Difficulty of Paying Living Expenses: Not on file  Food Insecurity:   . Worried About Charity fundraiser in the Last Year: Not on file  . Ran Out of Food in the Last Year: Not on file  Transportation Needs:   . Lack of Transportation (Medical): Not on file  . Lack of Transportation (Non-Medical): Not on file  Physical Activity:   . Days of Exercise per Week: Not on file  . Minutes of Exercise per Session: Not on file  Stress:   . Feeling of Stress : Not on file  Social Connections:   . Frequency of Communication with Friends and Family: Not on file  . Frequency of Social Gatherings with Friends and Family: Not on file  . Attends Religious Services: Not on file  . Active Member of Clubs or Organizations: Not on file  . Attends Archivist Meetings: Not on file  . Marital Status: Not on file  Intimate Partner Violence:   . Fear of Current or Ex-Partner: Not on file  . Emotionally Abused: Not on file  . Physically Abused: Not on file  . Sexually Abused: Not on file     BP 120/78   Pulse 63   Ht 5'  11" (1.803 m)   Wt 201 lb (91.2 kg)   SpO2 97%   BMI 28.03 kg/m   Physical Exam:  Well appearing NAD HEENT: Unremarkable Neck:  No JVD, no thyromegally Lymphatics:  No adenopathy Back:  No CVA tenderness Lungs:  Clear with no wheezes HEART:  Regular rate rhythm, no murmurs, no rubs, no clicks Abd:  soft, positive bowel sounds, no organomegally, no rebound, no  guarding Ext:  2 plus pulses, no edema, no cyanosis, no clubbing Skin:  No rashes no nodules Neuro:  CN II through XII intact, motor grossly intact   Assess/Plan: 1. PAF - - his atrial fib has been well controlled. He will continue cardizem. His stroke risk is low. 2. Autonomic dysfunction - he has been asymptomatic. No syncope.   Carleene Overlie Seanpaul Preece,MD

## 2020-04-03 ENCOUNTER — Other Ambulatory Visit: Payer: Self-pay

## 2020-04-03 ENCOUNTER — Encounter: Payer: Self-pay | Admitting: Family Medicine

## 2020-04-03 ENCOUNTER — Ambulatory Visit (INDEPENDENT_AMBULATORY_CARE_PROVIDER_SITE_OTHER): Payer: Medicare Other | Admitting: Family Medicine

## 2020-04-03 VITALS — BP 113/75 | HR 67 | Temp 98.0°F | Ht 71.0 in | Wt 202.0 lb

## 2020-04-03 DIAGNOSIS — Z0000 Encounter for general adult medical examination without abnormal findings: Secondary | ICD-10-CM | POA: Diagnosis not present

## 2020-04-03 DIAGNOSIS — F5101 Primary insomnia: Secondary | ICD-10-CM | POA: Diagnosis not present

## 2020-04-03 DIAGNOSIS — E785 Hyperlipidemia, unspecified: Secondary | ICD-10-CM

## 2020-04-03 DIAGNOSIS — M069 Rheumatoid arthritis, unspecified: Secondary | ICD-10-CM | POA: Diagnosis not present

## 2020-04-03 DIAGNOSIS — R351 Nocturia: Secondary | ICD-10-CM | POA: Diagnosis not present

## 2020-04-03 DIAGNOSIS — Z6828 Body mass index (BMI) 28.0-28.9, adult: Secondary | ICD-10-CM

## 2020-04-03 DIAGNOSIS — R739 Hyperglycemia, unspecified: Secondary | ICD-10-CM

## 2020-04-03 DIAGNOSIS — I480 Paroxysmal atrial fibrillation: Secondary | ICD-10-CM | POA: Diagnosis not present

## 2020-04-03 DIAGNOSIS — E663 Overweight: Secondary | ICD-10-CM | POA: Diagnosis not present

## 2020-04-03 DIAGNOSIS — Z8042 Family history of malignant neoplasm of prostate: Secondary | ICD-10-CM

## 2020-04-03 LAB — CBC
HCT: 46.4 % (ref 38.5–50.0)
Hemoglobin: 15.5 g/dL (ref 13.2–17.1)
MCH: 29.2 pg (ref 27.0–33.0)
MCHC: 33.4 g/dL (ref 32.0–36.0)
MCV: 87.4 fL (ref 80.0–100.0)
MPV: 10.5 fL (ref 7.5–12.5)
Platelets: 263 10*3/uL (ref 140–400)
RBC: 5.31 10*6/uL (ref 4.20–5.80)
RDW: 12.8 % (ref 11.0–15.0)
WBC: 6 10*3/uL (ref 3.8–10.8)

## 2020-04-03 NOTE — Assessment & Plan Note (Signed)
Check lipid panel  

## 2020-04-03 NOTE — Assessment & Plan Note (Signed)
Continue management per cardiology. HE is on aspirin and cardizem and tolerating well.

## 2020-04-03 NOTE — Patient Instructions (Signed)
It was very nice to see you today!  We will check blood work today.   I will see you back in a year for your next physical. Please come back to see me sooner if needed.   Take care, Dr [name]  Please try these tips to maintain a healthy lifestyle:   Eat at least 3 REAL meals and 1-2 snacks per day.  Aim for no more than 5 hours between eating.  If you eat breakfast, please do so within one hour of getting up.    Each meal should contain half fruits/vegetables, one quarter protein, and one quarter carbs (no bigger than a computer mouse)   Cut down on sweet beverages. This includes juice, soda, and sweet tea.     Drink at least 1 glass of water with each meal and aim for at least 8 glasses per day   Exercise at least 150 minutes every week.    Preventive Care 9 Years and Older, Male Preventive care refers to lifestyle choices and visits with your health care provider that can promote health and wellness. This includes:  A yearly physical exam. This is also called an annual well check.  Regular dental and eye exams.  Immunizations.  Screening for certain conditions.  Healthy lifestyle choices, such as diet and exercise. What can I expect for my preventive care visit? Physical exam Your health care provider will check:  Height and weight. These may be used to calculate body mass index (BMI), which is a measurement that tells if you are at a healthy weight.  Heart rate and blood pressure.  Your skin for abnormal spots. Counseling Your health care provider may ask you questions about:  Alcohol, tobacco, and drug use.  Emotional well-being.  Home and relationship well-being.  Sexual activity.  Eating habits.  History of falls.  Memory and ability to understand (cognition).  Work and work Statistician. What immunizations do I need?  Influenza (flu) vaccine  This is recommended every year. Tetanus, diphtheria, and pertussis (Tdap) vaccine  You may need  a Td booster every 10 years. Varicella (chickenpox) vaccine  You may need this vaccine if you have not already been vaccinated. Zoster (shingles) vaccine  You may need this after age 59. Pneumococcal conjugate (PCV13) vaccine  One dose is recommended after age 16. Pneumococcal polysaccharide (PPSV23) vaccine  One dose is recommended after age 70. Measles, mumps, and rubella (MMR) vaccine  You may need at least one dose of MMR if you were born in 1957 or later. You may also need a second dose. Meningococcal conjugate (MenACWY) vaccine  You may need this if you have certain conditions. Hepatitis A vaccine  You may need this if you have certain conditions or if you travel or work in places where you may be exposed to hepatitis A. Hepatitis B vaccine  You may need this if you have certain conditions or if you travel or work in places where you may be exposed to hepatitis B. Haemophilus influenzae type b (Hib) vaccine  You may need this if you have certain conditions. You may receive vaccines as individual doses or as more than one vaccine together in one shot (combination vaccines). Talk with your health care provider about the risks and benefits of combination vaccines. What tests do I need? Blood tests  Lipid and cholesterol levels. These may be checked every 5 years, or more frequently depending on your overall health.  Hepatitis C test.  Hepatitis B test. Screening  Lung  cancer screening. You may have this screening every year starting at age 39 if you have a 30-pack-year history of smoking and currently smoke or have quit within the past 15 years.  Colorectal cancer screening. All adults should have this screening starting at age 51 and continuing until age 50. Your health care provider may recommend screening at age 68 if you are at increased risk. You will have tests every 1-10 years, depending on your results and the type of screening test.  Prostate cancer screening.  Recommendations will vary depending on your family history and other risks.  Diabetes screening. This is done by checking your blood sugar (glucose) after you have not eaten for a while (fasting). You may have this done every 1-3 years.  Abdominal aortic aneurysm (AAA) screening. You may need this if you are a current or former smoker.  Sexually transmitted disease (STD) testing. Follow these instructions at home: Eating and drinking  Eat a diet that includes fresh fruits and vegetables, whole grains, lean protein, and low-fat dairy products. Limit your intake of foods with high amounts of sugar, saturated fats, and salt.  Take vitamin and mineral supplements as recommended by your health care provider.  Do not drink alcohol if your health care provider tells you not to drink.  If you drink alcohol: ? Limit how much you have to 0-2 drinks a day. ? Be aware of how much alcohol is in your drink. In the [place] one drink equals one 12 oz bottle of beer (355 mL), one 5 oz glass of wine (148 mL), or one 1 oz glass of hard liquor (44 mL). Lifestyle  Take daily care of your teeth and gums.  Stay active. Exercise for at least 30 minutes on 5 or more days each week.  Do not use any products that contain nicotine or tobacco, such as cigarettes, e-cigarettes, and chewing tobacco. If you need help quitting, ask your health care provider.  If you are sexually active, practice safe sex. Use a condom or other form of protection to prevent STIs (sexually transmitted infections).  Talk with your health care provider about taking a low-dose aspirin or statin. What's next?  Visit your health care provider once a year for a well check visit.  Ask your health care provider how often you should have your eyes and teeth checked.  Stay up to date on all vaccines. This information is not intended to replace advice given to you by your health care provider. Make sure you discuss any questions you have with  your health care provider. Document Revised: 05/04/2018 Document Reviewed: 05/04/2018 Elsevier Patient Education  2020 [employer].

## 2020-04-03 NOTE — Assessment & Plan Note (Signed)
Check PSA. ?

## 2020-04-03 NOTE — Assessment & Plan Note (Signed)
Continue enbrel per rheumatology.

## 2020-04-03 NOTE — Assessment & Plan Note (Signed)
Check A1c. 

## 2020-04-03 NOTE — Progress Notes (Signed)
Chief Complaint:  William West is a 69 y.o. male who presents today for a subsequent Medicare Annual Wellness Visit and to discuss management of his chronic medical problems.  Assessment/Plan:  Chronic Problems Addressed Today: Dyslipidemia Check lipid panel.   Atrial fibrillation ([place]), on Cardizem daily, with prn ASA and Diltiazem Continue management per cardiology. HE is on aspirin and cardizem and tolerating well.   Hyperglycemia Check A1c.   Family history of prostate cancer Check PSA.   Rheumatoid arthritis ([place]), on Enbrel, followed by Dr. Amil Amen Continue enbrel per rheumatology.   Insomnia Stable. Continue lorazepam as needed.    Body mass index is 28.17 kg/m. / Overweight  BMI Metric Follow Up - 04/03/20 0831      BMI Metric Follow Up-Please document annually   BMI Metric Follow Up Education provided            Preventative Healthcare Check PSA. Has received flu and covid vaccines. Up to date on pneumonia vaccine. Due for colonoscopy in 2023.   There are no preventive care reminders to display for this patient.  During the course of the visit the patient was educated and counseled about appropriate screening and preventive services including:        Fall prevention   Nutrition Physical Activity Weight Management Cognition    Subjective:  HPI:  Health Risk Assessment: Patient considers his overall health to be good. He has no difficulty performing the following: . Preparing food and eating . Bathing  . Getting dressed . Using the toilet . Shopping . Managing Finances . Moving around from place to place  He has not had any falls within the past year.   Depression screen PHQ 2/9 04/03/2020  Decreased Interest 0  Down, Depressed, Hopeless 0  PHQ - 2 Score 0  Altered sleeping -  Tired, decreased energy -  Change in appetite -  Feeling bad or failure about yourself  -  Trouble concentrating -  Moving slowly or fidgety/restless -    Suicidal thoughts -  PHQ-9 Score -  Difficult doing work/chores -   Lifestyle Factors: Diet: None specific diets or eating plans.  Exercise: Walks and goes to [employer].   Patient Care Team: [name], MD as PCP - General (Family Medicine) Hennie Duos, MD as Consulting Physician (Rheumatology) Juanita Craver, MD as Consulting Physician (Gastroenterology)   He has no acute complaints today.   ROS: Per HPI, otherwise a complete review of systems was negative.   PMH:  The following were reviewed and entered/updated in epic: Past Medical History:  Diagnosis Date  . A-fib ([place])   . Acute blood loss anemia 01/26/2015  . Allergic rhinitis   . Diverticulosis of colon   . Dysplastic nevus   . GERD (gastroesophageal reflux disease)   . GI bleeding 01/24/2015  . Hypercholesterolemia   . Postural dizziness with near syncope due to dehydration 12/09/2016  . RA (rheumatoid arthritis) [facility])    Patient Active Problem List   Diagnosis Date Noted  . Hyperglycemia 04/05/2019  . Family history of prostate cancer 04/03/2019  . GERD (gastroesophageal reflux disease), on Omeprazole 07/01/2018  . History of GI bleed 07/01/2018  . Insomnia 07/01/2018  . Abnormal CT of the chest 03/03/2018  . Sensorineural hearing loss (SNHL), bilateral 12/28/2017  . Tinnitus, bilateral 12/28/2017  . Pulmonary nodule, left 12/09/2016  . Former smoker 01/16/2015  . Atrial fibrillation ([place]), on Cardizem daily, with prn ASA and Diltiazem 04/25/2012  .  Rheumatoid arthritis ([place]), on Enbrel, followed by Dr. Amil Amen 01/06/2011  . Allergic rhinitis 04/22/2009  . Dyslipidemia 10/24/2008  . Diverticulosis of large intestine 06/21/2007   Past Surgical History:  Procedure Laterality Date  . COLONOSCOPY  01/24/2015   x 3  . ERCP N/A 02/18/2015   Procedure: ENDOSCOPIC RETROGRADE CHOLANGIOPANCREATOGRAPHY (ERCP);  Surgeon: Carol Ada, MD;  Location: [facility];  Service: Endoscopy;  Laterality: N/A;  .  GIVENS CAPSULE STUDY N/A 01/24/2015   Procedure: GIVENS CAPSULE STUDY;  Surgeon: Juanita Craver, MD;  Location: [place];  Service: Endoscopy;  Laterality: N/A;  . INGUINAL HERNIA REPAIR Right 07/27/2019   Procedure: LAPAROSCOPIC RIGHT INGUINAL HERNIA REPAIR WITH MESH;  Surgeon: Ralene Ok, MD;  Location: [facility];  Service: General;  Laterality: Right;  . LAPAROSCOPIC CHOLECYSTECTOMY  2011  . TOE AMPUTATION  1966  . TONSILLECTOMY AND ADENOIDECTOMY      Family History  Problem Relation Age of Onset  . Prostate cancer Father   . Heart disease Mother   . Rheum arthritis Mother     Medications- reviewed and updated Current Outpatient Medications  Medication Sig Dispense Refill  . acetaminophen (TYLENOL) 160 MG/5ML liquid Take 325 mg by mouth every 4 (four) hours as needed for pain.    [name] aspirin EC 81 MG tablet Take 81 mg by mouth daily as needed (afib).     . clotrimazole (LOTRIMIN) 1 % cream Apply 1 application topically daily as needed (jock itch).    [name] diltiazem (CARDIZEM CD) 120 MG 24 hr capsule Take 1 capsule (120 mg total) by mouth daily. 90 capsule 1  . docusate sodium (COLACE) 100 MG capsule Take 100 mg by mouth as needed.     . etanercept (ENBREL) 50 MG/ML injection Inject 50 mg into the skin every Monday.     [name] ibuprofen (ADVIL,MOTRIN) 200 MG tablet Take 600 mg by mouth every 8 (eight) hours as needed for moderate pain.     [name] LORazepam (ATIVAN) 1 MG tablet TAKE 1 TABLET BY MOUTH AT BEDTIME 30 tablet 5  . Multiple Vitamin (MULTIVITAMIN) tablet Take 1 tablet by mouth daily.    [name] neomycin-bacitracin-polymyxin (NEOSPORIN) ointment Apply 1 application topically as needed for wound care.    [name] omeprazole (PRILOSEC) 40 MG capsule Take 40 mg by mouth every evening.     . Probiotic Product (PROBIOTIC-10 ULTIMATE) CAPS Take by mouth.    . vitamin C (ASCORBIC ACID) 500 MG tablet Take 500 mg by mouth daily.      No current facility-administered medications for this visit.     Allergies-reviewed and updated Allergies  Allergen Reactions  . Caffeine Other (See Comments)    Causes him to go into afib  . Flecainide Other (See Comments)    Caused heart to race too fast and had him in the ED  . Methotrexate Derivatives Nausea And Vomiting    Increased liver enzymes  . Pseudoephedrine-Ibuprofen Other (See Comments)    " afib"   . Penicillins Rash    Did it involve swelling of the face/tongue/throat, SOB, or low BP? No Did it involve sudden or severe rash/hives, skin peeling, or any reaction on the inside of your mouth or nose? No Did you need to seek medical attention at a hospital or doctor's office? No When did it last happen?40 + years If all above answers are "NO", may proceed with cephalosporin use.     Social History   Socioeconomic History  . Marital status: Married  Spouse name: Jeani Hawking  . Number of children: 2  . Years of education: Not on file  . Highest education level: Not on file  Occupational History  . Occupation: [employer]    Comment: RETIRED  Tobacco Use  . Smoking status: Former Smoker    Packs/day: 0.50    Years: 45.00    Pack years: 22.50    Types: Cigarettes    Quit date: 11/13/2009    Years since quitting: 10.3  . Smokeless tobacco: Never Used  Vaping Use  . Vaping Use: Never used  Substance and Sexual Activity  . Alcohol use: No    Alcohol/week: 0.0 standard drinks  . Drug use: No  . Sexual activity: Never  Other Topics Concern  . Not on file  Social History Narrative   One son with HBP. One son with IDDM. Exercises regularly at the [facility]. Eats well. Down 30 pounds with the lifestyle change. Exercise keeps him from feeling so stiff. Weighs daily. Baseline 199-204.    Social Determinants of Health   Financial Resource Strain:   . Difficulty of Paying Living Expenses: Not on file  Food Insecurity:   . Worried About Charity fundraiser in the Last Year: Not on file  . Ran Out of Food in the Last Year: Not on  file  Transportation Needs:   . Lack of Transportation (Medical): Not on file  . Lack of Transportation (Non-Medical): Not on file  Physical Activity:   . Days of Exercise per Week: Not on file  . Minutes of Exercise per Session: Not on file  Stress:   . Feeling of Stress : Not on file  Social Connections:   . Frequency of Communication with Friends and Family: Not on file  . Frequency of Social Gatherings with Friends and Family: Not on file  . Attends Religious Services: Not on file  . Active Member of Clubs or Organizations: Not on file  . Attends Archivist Meetings: Not on file  . Marital Status: Not on file         Objective/Observations  Physical Exam: BP 113/75   Pulse 67   Temp 98 F (36.7 C) (Temporal)   Ht 5\' 11"  (1.803 m)   Wt 202 lb (91.6 kg)   SpO2 98%   BMI 28.17 kg/m  Gen: NAD, resting comfortably HEENT: TMs normal bilaterally. OP clear. No thyromegaly noted.  CV: RRR with no murmurs appreciated Pulm: NWOB, CTAB with no crackles, wheezes, or rhonchi GI: Normal bowel sounds present. Soft, Nontender, Nondistended. MSK: no edema, cyanosis, or clubbing noted Skin: warm, dry Neuro: CN2-12 grossly intact. Strength 5/5 in upper and lower extremities. Reflexes symmetric and intact bilaterally. Normal minicog with 3/3 delayed word recall. Psych: Normal affect and thought content  No results found for this or any previous visit (from the past 24 hour(s)).      Algis Greenhouse. [name], MD 04/03/2020 8:39 AM

## 2020-04-03 NOTE — Assessment & Plan Note (Signed)
Stable. Continue lorazepam as needed.

## 2020-04-04 LAB — COMPREHENSIVE METABOLIC PANEL
AG Ratio: 2 (calc) (ref 1.0–2.5)
ALT: 13 U/L (ref 9–46)
AST: 18 U/L (ref 10–35)
Albumin: 4.5 g/dL (ref 3.6–5.1)
Alkaline phosphatase (APISO): 62 U/L (ref 35–144)
BUN: 15 mg/dL (ref 7–25)
CO2: 27 mmol/L (ref 20–32)
Calcium: 9.7 mg/dL (ref 8.6–10.3)
Chloride: 108 mmol/L (ref 98–110)
Creat: 1.23 mg/dL (ref 0.70–1.25)
Globulin: 2.2 g/dL (calc) (ref 1.9–3.7)
Glucose, Bld: 97 mg/dL (ref 65–99)
Potassium: 4.6 mmol/L (ref 3.5–5.3)
Sodium: 143 mmol/L (ref 135–146)
Total Bilirubin: 0.8 mg/dL (ref 0.2–1.2)
Total Protein: 6.7 g/dL (ref 6.1–8.1)

## 2020-04-04 LAB — LIPID PANEL
Cholesterol: 141 mg/dL (ref <200)
HDL: 48 mg/dL (ref >=40)
LDL Cholesterol (Calc): 78 mg/dL (calc)
Non-HDL Cholesterol (Calc): 93 mg/dL (calc) (ref <130)
Total CHOL/HDL Ratio: 2.9 (calc) (ref <5.0)
Triglycerides: 73 mg/dL (ref <150)

## 2020-04-04 LAB — PSA: PSA: 1.08 ng/mL (ref <=4.0)

## 2020-04-04 LAB — HEMOGLOBIN A1C
Hgb A1c MFr Bld: 4.7 % of total Hgb (ref <5.7)
Mean Plasma Glucose: 88 (calc)
eAG (mmol/L): 4.9 (calc)

## 2020-04-04 LAB — TSH: TSH: 2.7 mIU/L (ref 0.40–4.50)

## 2020-04-04 NOTE — Progress Notes (Signed)
Please inform patient of the following:  Labs are all NORMAL. Do not need to make any changes to his treatment plan at this time. Would like for him to keep up the good work and we can recheck in a year.  Algis Greenhouse. [name], MD 04/04/2020 8:03 AM

## 2020-04-24 DIAGNOSIS — Z86010 Personal history of colonic polyps: Secondary | ICD-10-CM | POA: Diagnosis not present

## 2020-04-24 DIAGNOSIS — K219 Gastro-esophageal reflux disease without esophagitis: Secondary | ICD-10-CM | POA: Diagnosis not present

## 2020-04-24 DIAGNOSIS — K5730 Diverticulosis of large intestine without perforation or abscess without bleeding: Secondary | ICD-10-CM | POA: Diagnosis not present

## 2020-06-17 ENCOUNTER — Other Ambulatory Visit: Payer: Self-pay | Admitting: Family Medicine

## 2020-06-19 DIAGNOSIS — H25043 Posterior subcapsular polar age-related cataract, bilateral: Secondary | ICD-10-CM | POA: Diagnosis not present

## 2020-06-20 DIAGNOSIS — Z23 Encounter for immunization: Secondary | ICD-10-CM | POA: Diagnosis not present

## 2020-07-19 ENCOUNTER — Other Ambulatory Visit: Payer: Self-pay | Admitting: Family Medicine

## 2020-07-21 DIAGNOSIS — M0579 Rheumatoid arthritis with rheumatoid factor of multiple sites without organ or systems involvement: Secondary | ICD-10-CM | POA: Diagnosis not present

## 2020-07-21 DIAGNOSIS — E663 Overweight: Secondary | ICD-10-CM | POA: Diagnosis not present

## 2020-07-21 DIAGNOSIS — Z79899 Other long term (current) drug therapy: Secondary | ICD-10-CM | POA: Diagnosis not present

## 2020-07-21 DIAGNOSIS — Z6827 Body mass index (BMI) 27.0-27.9, adult: Secondary | ICD-10-CM | POA: Diagnosis not present

## 2020-07-21 NOTE — Telephone Encounter (Signed)
Rx Request 

## 2020-08-28 DIAGNOSIS — D225 Melanocytic nevi of trunk: Secondary | ICD-10-CM | POA: Diagnosis not present

## 2020-08-28 DIAGNOSIS — S20461A Insect bite (nonvenomous) of right back wall of thorax, initial encounter: Secondary | ICD-10-CM | POA: Diagnosis not present

## 2020-08-28 DIAGNOSIS — D485 Neoplasm of uncertain behavior of skin: Secondary | ICD-10-CM | POA: Diagnosis not present

## 2020-09-04 ENCOUNTER — Telehealth: Payer: Self-pay | Admitting: Family Medicine

## 2020-09-04 NOTE — Chronic Care Management (AMB) (Signed)
  Chronic Care Management   Note  09/04/2020 Name: William West MRN: [id number] DOB: 1950-12-27  William West is a 70 y.o. year old male who is a primary care patient of [name], Algis Greenhouse, MD. I reached out to Theresia Majors by phone today in response to a referral sent by William West PCP, [name], MD.   Mr. Negro was given information about Chronic Care Management services today including:  1. CCM service includes personalized support from designated clinical staff supervised by his physician, including individualized plan of care and coordination with other care providers 2. 24/7 contact phone numbers for assistance for urgent and routine care needs. 3. Service will only be billed when office clinical staff spend 20 minutes or more in a month to coordinate care. 4. Only one practitioner may furnish and bill the service in a calendar month. 5. The patient may stop CCM services at any time (effective at the end of the month) by phone call to the office staff.   Patient agreed to services and verbal consent obtained.   Follow up plan:   Lauretta Grill Upstream Scheduler

## 2020-09-07 ENCOUNTER — Other Ambulatory Visit: Payer: Self-pay | Admitting: Internal Medicine

## 2020-09-07 ENCOUNTER — Other Ambulatory Visit: Payer: Self-pay | Admitting: Family Medicine

## 2020-09-11 ENCOUNTER — Telehealth: Payer: Self-pay

## 2020-09-11 NOTE — Chronic Care Management (AMB) (Signed)
Chronic Care Management Pharmacy Assistant   Name: William West  MRN: [id number] DOB: 01/22/1951  William West is an 70 y.o. year old male who presents for his initial CCM visit with the clinical pharmacist.  Reason for Encounter: Chart Prep  Recent office visits:  04/03/20- William Chyle, MD- [facility], chronic conditions addressed, follow up 1 yr  Recent consult visits:  No visits noted  Hospital visits:  None in previous 6 months  Medications: Outpatient Encounter Medications as of 09/11/2020  Medication Sig Note  . LORazepam (ATIVAN) 1 MG tablet TAKE 1 TABLET BY MOUTH AT BEDTIME   . acetaminophen (TYLENOL) 160 MG/5ML liquid Take 325 mg by mouth every 4 (four) hours as needed for pain.   [name] aspirin EC 81 MG tablet Take 81 mg by mouth daily as needed (afib).    . clotrimazole (LOTRIMIN) 1 % cream Apply 1 application topically daily as needed (jock itch).   [name] diltiazem (CARDIZEM CD) 120 MG 24 hr capsule Take 1 capsule by mouth once daily   . docusate sodium (COLACE) 100 MG capsule Take 100 mg by mouth as needed.    . etanercept (ENBREL) 50 MG/ML injection Inject 50 mg into the skin every Monday.  07/17/2019: On hold for procedure  . ibuprofen (ADVIL,MOTRIN) 200 MG tablet Take 600 mg by mouth every 8 (eight) hours as needed for moderate pain.    . Multiple Vitamin (MULTIVITAMIN) tablet Take 1 tablet by mouth daily.   [name] neomycin-bacitracin-polymyxin (NEOSPORIN) ointment Apply 1 application topically as needed for wound care.   [name] omeprazole (PRILOSEC) 40 MG capsule Take 40 mg by mouth every evening.    . Probiotic Product (PROBIOTIC-10 ULTIMATE) CAPS Take by mouth.   . vitamin C (ASCORBIC ACID) 500 MG tablet Take 500 mg by mouth daily.     No facility-administered encounter medications on file as of 09/11/2020.   Current Documented Medications Lorazepam 1 mg- 30 DS last filled 06/17/20 Acetaminophen 160 mg / 5 ml asprin 81 mg Clotrimazole 1%  Diltiazem 120 mg- 90 DS last  filled 06/09/20 Docusate sodium 100 mg Etanercept 50 mg/ ml inj- [place] 90 DS rheumatology   Ibuprofen 200 mg Multivitamin Neosporin Omeprazole 40 mg- 90 DS last filled 05/13/20 before evening meal Probiotic - 10 ultimate Vitamin C 500 mg- reported taking 250 mg daily    Have you seen any other providers since your last visit? Patient stated he saw his dermatologist a few weeks ago  Any changes in your medications or health?  Patient stated he was prescribed betamethasone dip aug 0.05 for a tick bite by his dermatologist, William West, a couple weeks ago  Any side effects from any medications?  Patient stated there are currently no side effects from any medications  Do you have an symptoms or problems not managed by your medications?  Patient stated that sometimes his indigestion is not managed by his medication for he drinks a mixture of ginger ale and water and also takes antacids  Any concerns about your health right now?  Patient stated there are currently no concerns  Has your provider asked that you check blood pressure, blood sugar, or follow special diet at home?  Patient stated he was asked to check his blood pressure. Recent values given  were 110/70 and  112/70  Do you get any type of exercise on a regular basis?  Patient stated he visits the [facility] a couple times a week. He also states he plays golf at  least once a week.  Can you think of a goal you would like to reach for your health? Patient would like to enjoy life.  Do you have any problems getting your medications?  Patient stated he currently has no problems getting his medications  Is there anything that you would like to discuss during the appointment? Patient stated there was nothing he could think of to discuss at the moment.   William West returned my call for initial questions. He was at the [facility] this morning with his wife when I initially called. He and his wife will be celebrating their 50th anniversary  04/17/21. 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 William West wife prepares mostly all of his meals consisting of veggies, chicken, steak, burgers, lasagna, and biscuits with bacon and egg. They both do activities together, but William West does mow the yard and prepare coal and wood for their home.  Please bring medications and supplements to appointment Reminded patient of initial in person visit with CPP on 04/25 @ 11 am  William West, [place]

## 2020-09-12 NOTE — Progress Notes (Signed)
Chronic Care Management Pharmacy Note  09/15/2020 Name:  William West MRN:  [id number] DOB:  [birth year]  Subjective: William West is an 70 y.o. year old male who is a primary patient of [name], Algis Greenhouse, MD.  The CCM team was consulted for assistance with disease management and care coordination needs.    Engaged with patient by telephone for initial visit in response to provider referral for pharmacy case management and/or care coordination services.   Consent to Services:  The patient was given the following information about Chronic Care Management services today, agreed to services, and gave verbal consent: 1. CCM service includes personalized support from designated clinical staff supervised by the primary care provider, including individualized plan of care and coordination with other care providers 2. 24/7 contact phone numbers for assistance for urgent and routine care needs. 3. Service will only be billed when office clinical staff spend 20 minutes or more in a month to coordinate care. 4. Only one practitioner may furnish and bill the service in a calendar month. 5.The patient may stop CCM services at any time (effective at the end of the month) by phone call to the office staff. 6. The patient will be responsible for cost sharing (co-pay) of up to 20% of the service fee (after annual deductible is met). Patient agreed to services and consent obtained.  Patient Care Team: [name], MD as PCP - General (Family Medicine) Hennie Duos, MD as Consulting Physician (Rheumatology) Juanita Craver, MD as Consulting Physician (Gastroenterology) Madelin Rear, [facility] as Pharmacist (Pharmacist)  Recent office visits:  04/03/20- Dimas Chyle, MD- [facility], chronic conditions addressed, follow up 1 yr  Recent consult visits:  No visits noted  Hospital visits:  None in previous 6 months  Objective:  Lab Results  Component Value Date   CREATININE 1.23 04/03/2020   CREATININE  1.14 07/24/2019   CREATININE 1.08 04/03/2019    Lab Results  Component Value Date   HGBA1C 4.7 04/03/2020   Last diabetic Eye exam: No results found for: HMDIABEYEEXA  Last diabetic Foot exam: No results found for: HMDIABFOOTEX      Component Value Date/Time   CHOL 141 04/03/2020 0853   TRIG 73 04/03/2020 0853   HDL 48 04/03/2020 0853   CHOLHDL 2.9 04/03/2020 0853   VLDL 16.6 04/03/2019 1012   LDLCALC 78 04/03/2020 0853    Hepatic Function Latest Ref Rng & Units 04/03/2020 04/03/2019 07/19/2018  Total Protein 6.1 - 8.1 g/dL 6.7 6.8 -  Albumin 3.5 - 5.2 g/dL - 4.5 -  AST 10 - 35 U/L $Remo'18 20 22  'PZhmD$ ALT 9 - 46 U/L $Remo'13 18 15  'Ictwi$ Alk Phosphatase 39 - 117 U/L - 63 60  Total Bilirubin 0.2 - 1.2 mg/dL 0.8 0.8 -  Bilirubin, Direct 0.0 - 0.3 mg/dL - - -    Lab Results  Component Value Date/Time   TSH 2.70 04/03/2020 08:53 AM   TSH 2.39 04/03/2019 10:12 AM    CBC Latest Ref Rng & Units 04/03/2020 07/24/2019 04/03/2019  WBC 3.8 - 10.8 Thousand/uL 6.0 6.8 6.1  Hemoglobin 13.2 - 17.1 g/dL 15.5 14.6 14.8  Hematocrit 38.5 - 50.0 % 46.4 44.4 43.0  Platelets 140 - 400 Thousand/uL 263 227 240.0    Lab Results  Component Value Date/Time   VD25OH 37.74 02/21/2017 10:41 AM    Clinical ASCVD:  The 10-year ASCVD risk score Mikey Bussing DC Jr., et al., 2013) is: 11.7%   Values used to calculate  the score:     Age: 7 years     Sex: Male     Is Non-Hispanic African American: No     Diabetic: No     Tobacco smoker: No     Systolic Blood Pressure: 782 mmHg     Is BP treated: No     HDL Cholesterol: 48 mg/dL     Total Cholesterol: 141 mg/dL     Social History   Tobacco Use  Smoking Status Former Smoker  . Packs/day: 0.50  . Years: 45.00  . Pack years: 22.50  . Types: Cigarettes  . Quit date: 11/13/2009  . Years since quitting: 10.8  Smokeless Tobacco Never Used   BP Readings from Last 3 Encounters:  04/03/20 113/75  03/12/20 120/78  02/11/20 114/70   Pulse Readings from Last 3  Encounters:  04/03/20 67  03/12/20 63  02/11/20 74   Wt Readings from Last 3 Encounters:  04/03/20 202 lb (91.6 kg)  03/12/20 201 lb (91.2 kg)  02/11/20 203 lb (92.1 kg)   Assessment: Review of patient past medical history, allergies, medications, health status, including review of consultants reports, laboratory and other test data, was performed as part of comprehensive evaluation and provision of chronic care management services.   SDOH:  (Social Determinants of Health) assessments and interventions performed: Yes  CCM Care Plan  Allergies  Allergen Reactions  . Caffeine Other (See Comments)    Causes him to go into afib  . Flecainide Other (See Comments)    Caused heart to race too fast and had him in the ED  . Methotrexate Derivatives Nausea And Vomiting    Increased liver enzymes  . Pseudoephedrine-Ibuprofen Other (See Comments)    " afib"   . Penicillins Rash    Did it involve swelling of the face/tongue/throat, SOB, or low BP? No Did it involve sudden or severe rash/hives, skin peeling, or any reaction on the inside of your mouth or nose? No Did you need to seek medical attention at a hospital or doctor's office? No When did it last happen?40 + years If all above answers are "NO", may proceed with cephalosporin use.     Medications Reviewed Today    Reviewed by Madelin Rear, [facility] (Pharmacist) on 09/15/20 at [place] List Status: <None>  Medication Order Taking? Sig Documenting Provider Last Dose Status Informant  acetaminophen (TYLENOL) 160 MG/5ML liquid [id number]  Take 325 mg by mouth every 4 (four) hours as needed for pain. [provider]  Active Self  aspirin EC 81 MG tablet [id number]  Take 81 mg by mouth daily as needed (afib).  [provider]  Active Self  clotrimazole (LOTRIMIN) 1 % cream [id number]  Apply 1 application topically daily as needed (jock itch). [provider]  Active Self  diltiazem (CARDIZEM CD) 120 MG 24 hr  capsule [id number] Yes Take 1 capsule by mouth once daily Evans Lance, MD  Active   docusate sodium (COLACE) 100 MG capsule [id number]  Take 100 mg by mouth as needed.  [provider]  Active Self  etanercept (ENBREL) 50 MG/ML injection [id number] Yes Inject 50 mg into the skin every Monday.  [provider]  Active Self           Med Note Caryn Section, Marylyn Ishihara A   Tue Jul 17, 2019  1:25 PM) On hold for procedure  ibuprofen (ADVIL,MOTRIN) 200 MG tablet [id number]  Take 600 mg by mouth every 8 (eight) hours as needed  for moderate pain.  [provider]  Active Self  LORazepam (ATIVAN) 1 MG tablet [id number] Yes TAKE 1 TABLET BY MOUTH AT BEDTIME [name], MD  Active   Multiple Vitamin (MULTIVITAMIN) tablet [id number] Yes Take 1 tablet by mouth daily. [provider]  Active Self  neomycin-bacitracin-polymyxin (NEOSPORIN) ointment [id number]  Apply 1 application topically as needed for wound care. [provider]  Active Self  omeprazole (PRILOSEC) 40 MG capsule [id number] Yes Take 40 mg by mouth every evening.  [provider]  Active Self  Probiotic Product (PROBIOTIC-10 ULTIMATE) CAPS [id number]  Take by mouth. [provider]  Active   vitamin C (ASCORBIC ACID) 500 MG tablet [id number] Yes Take 500 mg by mouth daily.  [provider]  Active Self          Patient Active Problem List   Diagnosis Date Noted  . Hyperglycemia 04/05/2019  . Family history of prostate cancer 04/03/2019  . GERD (gastroesophageal reflux disease), on Omeprazole 07/01/2018  . History of GI bleed 07/01/2018  . Insomnia 07/01/2018  . Abnormal CT of the chest 03/03/2018  . Sensorineural hearing loss (SNHL), bilateral 12/28/2017  . Tinnitus, bilateral 12/28/2017  . Pulmonary nodule, left 12/09/2016  . Former smoker 01/16/2015  . Atrial fibrillation ([facility]), on Cardizem daily, with prn ASA and Diltiazem 04/25/2012  . Rheumatoid arthritis ([place]), on Enbrel,  followed by Dr. Amil Amen 01/06/2011  . Allergic rhinitis 04/22/2009  . Dyslipidemia 10/24/2008  . Diverticulosis of large intestine 06/21/2007   Immunization History  Administered Date(s) Administered  . Fluad Quad(high Dose 65+) 02/11/2020  . Influenza Split 02/22/2011, 03/14/2012, 03/16/2013, 02/08/2014, 02/22/2015  . Influenza, High Dose Seasonal PF 02/21/2017, 03/02/2018, 01/17/2019  . Influenza,inj,Quad PF,6+ Mos 01/21/2016  . PFIZER(Purple Top)SARS-COV-2 Vaccination 06/28/2019, 07/19/2019, 01/07/2020  . Pneumococcal Conjugate-13 08/22/2017  . Pneumococcal Polysaccharide-23 10/24/2008, 01/26/2019  . Td 09/21/2001, 10/24/2008  . Tdap 03/07/2014, 03/30/2018  . Zoster Recombinat (Shingrix) 03/13/2020    Conditions to be addressed/monitored: HLD Afib Insomnia Hyperglycemia Former Smoker GERD   Current Barriers:  . N/A  Pharmacist Clinical Goal(s):  [name] Patient will contact provider office for questions/concerns as evidenced notation of same in electronic health record through collaboration with PharmD and provider.   Interventions: . 1:1 collaboration with [name], MD regarding development and update of comprehensive plan of care as evidenced by provider attestation and co-signature . Inter-disciplinary care team collaboration (see longitudinal plan of care) . Comprehensive medication review performed; medication list updated in electronic medical record . No changes  Care Plan : [place]  Updates made by Madelin Rear, [facility] since 09/15/2020 12:00 AM    Problem: HLD Afib Insomnia Hyperglycemia Former Smoker GERD   Priority: High    Long-Range Goal: Disease Management   Start Date: 09/15/2020  This Visit's Progress: On track  Priority: High  Note:   Hyperlipidemia: (LDL goal < 100) -Controlled -Current treatment: . No medications -Current dietary patterns: chicken, steak, salads. Staying away from sweets. Portion control. -Current exercise habits:  golf, walking, [employer]  -Educated on Cholesterol goals;  Exercise goal of 150 minutes per week; -Counseled on diet and exercise extensively  Atrial Fibrillation (Goal: prevent stroke and major bleeding) -Controlled -Feels  -Current treatment: . Rate control: diltiazem 120 mg once every day (Dr Lovena Le) . Anticoagulation: on aspirin 81 mg once daily PRN -Counseled on importance of adherence to anticoagulant exactly as prescribed; -Counseled on diet and exercise extensively  GERD (Goal: minimize symptoms, optimize medication  safety) -Controlled -Hx of GI bleed -Current treatment  . Omeprazole 40 mg once daily (Dr Collene Mares) -Reviewed triggers at length - usually has issue with fatty foods, will start reducing use of butter/margarine  -Recommended to continue current medication  Insomnia (Goal: ensure) -Controlled -No alcohol, caffeine, or smoking  -Current treatment  . Lorazepam 1 mg once every night at bedtime -Medications previously tried: has attempted dose reduction to lorazepam 0.5 mg without success -Reviewed sleep hygiene, medication side effects - no problems noted   -Recommended to continue current medication  Patient Goals/Self-Care Activities . Patient will:  - target a minimum of 150 minutes of moderate intensity exercise weekly  Follow Up Plan: [place] f/u telephone visit 3 months  Medication Assistance: None required.  Patient affirms current coverage meets needs.  Patient's preferred pharmacy is:  [facility] [address], [place] - [place] [address] [place] [address] [place] [address] Phone: [phone] Fax: [phone]  Follow Up:  Patient agrees to Care Plan and Follow-up.     Future Appointments  Date Time Provider [place]  03/24/2021 10:30 AM [facility] CCM PHARMACIST [facility]  04/06/2021  8:20 AM [name], MD [facility]   Madelin Rear, Pharm.D., BCGP Clinical Pharmacist [place] [phone]

## 2020-09-15 ENCOUNTER — Ambulatory Visit (INDEPENDENT_AMBULATORY_CARE_PROVIDER_SITE_OTHER): Payer: Medicare Other

## 2020-09-15 DIAGNOSIS — I480 Paroxysmal atrial fibrillation: Secondary | ICD-10-CM

## 2020-09-15 DIAGNOSIS — E785 Hyperlipidemia, unspecified: Secondary | ICD-10-CM

## 2020-09-15 DIAGNOSIS — F5101 Primary insomnia: Secondary | ICD-10-CM

## 2020-09-15 DIAGNOSIS — K219 Gastro-esophageal reflux disease without esophagitis: Secondary | ICD-10-CM

## 2020-09-15 NOTE — Patient Instructions (Addendum)
[name],  Thank you for talking with me today. I have included our care plan/goals in the following pages.   Please review and call me at [phone] with any questions.  Thanks! William West, Pharm.D., BCGP Clinical Pharmacist [facility] [phone]  Patient Care Plan: William West Plan    Problem Identified: HLD Afib Insomnia Hyperglycemia Former Smoker GERD   Priority: High    Long-Range Goal: Disease Management   Start Date: 09/15/2020  This Visit's Progress: On track  Priority: High  Note:   Hyperlipidemia: (LDL goal < 100) -Controlled -Current treatment: . No medications -Current dietary patterns: chicken, steak, salads. Staying away from sweets. Portion control. -Current exercise habits: golf, walking, [employer]  -Educated on Cholesterol goals;  Exercise goal of 150 minutes per week; -Counseled on diet and exercise extensively  Atrial Fibrillation (Goal: prevent stroke and major bleeding) -Controlled -Feels  -Current treatment: . Rate control: diltiazem 120 mg once every day (Dr Lovena Le) . Anticoagulation: on aspirin 81 mg once daily PRN -Counseled on importance of adherence to anticoagulant exactly as prescribed; -Counseled on diet and exercise extensively  GERD (Goal: minimize symptoms, optimize medication safety) -Controlled -Hx of GI bleed -Current treatment  . Omeprazole 40 mg once daily (Dr Collene Mares) -Reviewed triggers at length - usually has issue with fatty foods, will start reducing use of butter/margarine  -Recommended to continue current medication  Insomnia (Goal: ensure) -Controlled -No alcohol, caffeine, or smoking  -Current treatment  . Lorazepam 1 mg once every night at bedtime -Medications previously tried: has attempted dose reduction to lorazepam 0.5 mg without success -Reviewed sleep hygiene, medication side effects - no problems noted   -Recommended to continue current medication  Patient  Goals/Self-Care Activities . Patient will:  - target a minimum of 150 minutes of moderate intensity exercise weekly  Follow Up Plan: [place] f/u telephone visit 3 months  Medication Assistance: None required.  Patient affirms current coverage meets needs.  Patient's preferred pharmacy is:  [facility] [address] [place] - [place] [address] [place] [address] [place] [address] Phone: [phone] Fax: [phone]  Follow Up:  Patient agrees to Care Plan and Follow-up.      The patient was given the following information about Chronic Care Management services today, agreed to services, and gave verbal consent: 1. CCM service includes personalized support from designated clinical staff supervised by the primary care provider, including individualized plan of care and coordination with other care providers 2. 24/7 contact phone numbers for assistance for urgent and routine care needs. 3. Service will only be billed when office clinical staff spend 20 minutes or more in a month to coordinate care. 4. Only one practitioner may furnish and bill the service in a calendar month. 5.The patient may stop CCM services at any time (effective at the end of the month) by phone call to the office staff. 6. The patient will be responsible for cost sharing (co-pay) of up to 20% of the service fee (after annual deductible is met). Patient agreed to services and consent obtained.  The patient verbalized understanding of instructions provided today and agreed to receive a MyChart copy of patient instruction and/or educational materials. Telephone follow up appointment with pharmacy team member scheduled for: See next appointment with "Care Management Staff" under "What's Next" below.   Food Choices for Gastroesophageal Reflux Disease, Adult When you have gastroesophageal reflux disease (GERD), the foods you eat and your  eating habits are very important. Choosing the right foods can help ease the  discomfort of GERD. Consider working with a dietitian to help you make healthy food choices. What are tips for following this plan? Reading food labels  Look for foods that are low in saturated fat. Foods that have less than 5% of daily value (DV) of fat and 0 g of trans fats may help with your symptoms. Cooking  Cook foods using methods other than frying. This may include baking, steaming, grilling, or broiling. These are all methods that do not need a lot of fat for cooking.  To add flavor, try to use herbs that are low in spice and acidity. Meal planning  Choose healthy foods that are low in fat, such as fruits, vegetables, whole grains, low-fat dairy products, lean meats, fish, and poultry.  Eat frequent, small meals instead of three large meals each day. Eat your meals slowly, in a relaxed setting. Avoid bending over or lying down until 2-3 hours after eating.  Limit high-fat foods such as fatty meats or fried foods.  Limit your intake of fatty foods, such as oils, butter, and shortening.  Avoid the following as told by your health care provider: ? Foods that cause symptoms. These may be different for different people. Keep a food diary to keep track of foods that cause symptoms. ? Alcohol. ? Drinking large amounts of liquid with meals. ? Eating meals during the 2-3 hours before bed.   Lifestyle  Maintain a healthy weight. Ask your health care provider what weight is healthy for you. If you need to lose weight, work with your health care provider to do so safely.  Exercise for at least 30 minutes on 5 or more days each week, or as told by your health care provider.  Avoid wearing clothes that fit tightly around your waist and chest.  Do not use any products that contain nicotine or tobacco. These products include cigarettes, chewing tobacco, and vaping devices, such as e-cigarettes. If you need help quitting, ask your health care provider.  Sleep with the head of your bed  raised. Use a wedge under the mattress or blocks under the bed frame to raise the head of the bed.  Chew sugar-free gum after mealtimes. What foods should I eat? Eat a healthy, well-balanced diet of fruits, vegetables, whole grains, low-fat dairy products, lean meats, fish, and poultry. Each person is different. Foods that may trigger symptoms in one person may not trigger any symptoms in another person. Work with your health care provider to identify foods that are safe for you. The items listed above may not be a complete list of recommended foods and beverages. Contact a dietitian for more information.   What foods should I avoid? Limiting some of these foods may help manage the symptoms of GERD. Everyone is different. Consult a dietitian or your health care provider to help you identify the exact foods to avoid, if any. Fruits Any fruits prepared with added fat. Any fruits that cause symptoms. For some people this may include citrus fruits, such as oranges, grapefruit, pineapple, and lemons. Vegetables Deep-fried vegetables. [place] fries. Any vegetables prepared with added fat. Any vegetables that cause symptoms. For some people, this may include tomatoes and tomato products, chili peppers, onions and garlic, and horseradish. Grains Pastries or quick breads with added fat. Meats and other proteins High-fat meats, such as fatty beef or pork, hot dogs, ribs, ham, sausage, salami, and bacon. Fried meat or protein,  including fried fish and fried chicken. Nuts and nut butters, in large amounts. Dairy Whole milk and chocolate milk. Sour cream. Cream. Ice cream. Cream cheese. Milkshakes. Fats and oils Butter. Margarine. Shortening. Ghee. Beverages Coffee and tea, with or without caffeine. Carbonated beverages. Sodas. Energy drinks. Fruit juice made with acidic fruits, such as orange or grapefruit. Tomato juice. Alcoholic drinks. Sweets and desserts Chocolate and cocoa. Donuts. Seasonings and  condiments Pepper. Peppermint and spearmint. Added salt. Any condiments, herbs, or seasonings that cause symptoms. For some people, this may include curry, hot sauce, or vinegar-based salad dressings. The items listed above may not be a complete list of foods and beverages to avoid. Contact a dietitian for more information. Questions to ask your health care provider Diet and lifestyle changes are usually the first steps that are taken to manage symptoms of GERD. If diet and lifestyle changes do not improve your symptoms, talk with your health care provider about taking medicines. Where to find more information  International Foundation for Gastrointestinal Disorders: aboutgerd.org Summary  When you have gastroesophageal reflux disease (GERD), food and lifestyle choices may be very helpful in easing the discomfort of GERD.  Eat frequent, small meals instead of three large meals each day. Eat your meals slowly, in a relaxed setting. Avoid bending over or lying down until 2-3 hours after eating.  Limit high-fat foods such as fatty meats or fried foods. This information is not intended to replace advice given to you by your health care provider. Make sure you discuss any questions you have with your health care provider. Document Revised: 11/19/2019 Document Reviewed: 11/19/2019 Elsevier Patient Education  [place].  Gallbladder Eating Plan If you have a gallbladder condition, you may have trouble digesting fats. Eating a low-fat diet can help reduce your symptoms, and may be helpful before and after having surgery to remove your gallbladder (cholecystectomy). Your health care provider may recommend that you work with a diet and nutrition specialist (dietitian) to help you reduce the amount of fat in your diet. What are tips for following this plan? General guidelines  Limit your fat intake to less than 30% of your total daily calories. If you eat around 1,800 calories each day, this is  less than 60 grams (g) of fat per day.  Fat is an important part of a healthy diet. Eating a low-fat diet can make it hard to maintain a healthy body weight. Ask your dietitian how much fat, calories, and other nutrients you need each day.  Eat small, frequent meals throughout the day instead of three large meals.  Drink at least 8-10 cups of fluid a day. Drink enough fluid to keep your urine clear or pale yellow.  Limit alcohol intake to no more than 1 drink a day for nonpregnant women and 2 drinks a day for men. One drink equals 12 oz of beer, 5 oz of wine, or 1 oz of hard liquor. Reading food labels  Check Nutrition Facts on food labels for the amount of fat per serving. Choose foods with less than 3 grams of fat per serving.   Shopping  Choose nonfat and low-fat healthy foods. Look for the words "nonfat," "low fat," or "fat free."  Avoid buying processed or prepackaged foods. Cooking  Cook using low-fat methods, such as baking, broiling, grilling, or boiling.  Cook with small amounts of healthy fats, such as olive oil, grapeseed oil, canola oil, or sunflower oil. What foods are recommended?  All fresh, frozen, or  canned fruits and vegetables.  Whole grains.  Low-fat or non-fat (skim) milk and yogurt.  Lean meat, skinless poultry, fish, eggs, and beans.  Low-fat protein supplement powders or drinks.  Spices and herbs. What foods are not recommended?  High-fat foods. These include baked goods, fast food, fatty cuts of meat, ice cream, french toast, sweet rolls, pizza, cheese bread, foods covered with butter, creamy sauces, or cheese.  Fried foods. These include french fries, tempura, battered fish, breaded chicken, fried breads, and sweets.  Foods with strong odors.  Foods that cause bloating and gas. Summary  A low-fat diet can be helpful if you have a gallbladder condition, or before and after gallbladder surgery.  Limit your fat intake to less than 30% of your  total daily calories. This is about 60 g of fat if you eat 1,800 calories each day.  Eat small, frequent meals throughout the day instead of three large meals. This information is not intended to replace advice given to you by your health care provider. Make sure you discuss any questions you have with your health care provider. Document Revised: 12/27/2019 Document Reviewed: 12/27/2019 Elsevier Patient Education  2021 [employer].

## 2020-11-10 ENCOUNTER — Other Ambulatory Visit: Payer: Self-pay | Admitting: Family Medicine

## 2020-11-13 ENCOUNTER — Telehealth: Payer: Self-pay

## 2020-11-13 NOTE — Chronic Care Management (AMB) (Signed)
    Chronic Care Management Pharmacy Assistant   Name: JONATHA GAGEN  MRN: [id number] DOB: May 20, 1951   Reason for Encounter: General adherence call   Recent office visits:  None  Recent consult visits:  None  Hospital visits:  None in previous 6 months  Medications: Outpatient Encounter Medications as of 11/13/2020  Medication Sig Note   acetaminophen (TYLENOL) 160 MG/5ML liquid Take 325 mg by mouth every 4 (four) hours as needed for pain.    aspirin EC 81 MG tablet Take 81 mg by mouth daily as needed (afib).     clotrimazole (LOTRIMIN) 1 % cream Apply 1 application topically daily as needed (jock itch).    diltiazem (CARDIZEM CD) 120 MG 24 hr capsule Take 1 capsule by mouth once daily    docusate sodium (COLACE) 100 MG capsule Take 100 mg by mouth as needed.     etanercept (ENBREL) 50 MG/ML injection Inject 50 mg into the skin every Monday.  07/17/2019: On hold for procedure   ibuprofen (ADVIL,MOTRIN) 200 MG tablet Take 600 mg by mouth every 8 (eight) hours as needed for moderate pain.     LORazepam (ATIVAN) 1 MG tablet TAKE 1 TABLET BY MOUTH AT BEDTIME    Multiple Vitamin (MULTIVITAMIN) tablet Take 1 tablet by mouth daily.    neomycin-bacitracin-polymyxin (NEOSPORIN) ointment Apply 1 application topically as needed for wound care.    omeprazole (PRILOSEC) 40 MG capsule Take 40 mg by mouth every evening.     Probiotic Product (PROBIOTIC-10 ULTIMATE) CAPS Take by mouth.    vitamin C (ASCORBIC ACID) 500 MG tablet Take 500 mg by mouth daily.     No facility-administered encounter medications on file as of 11/13/2020.   Have you had any problems recently with your health? Patient denies recent problems with his health.   Have you had any problems with your pharmacy? Patient denies problems obtaining medications for his medications.    What issues or side effects are you having with your medications? Patient denies side effects from medications.   What would you like me to  pass along to [facility] Nasuti [facility] for them to help you with?  Patient stated there was nothing to pass along, just a friendly hello.   What can we do to take care of you better? Patient stated there is nothing we can do to take better care of him.  He stated we are already taking good care.   Patient stated he is outside a lot with yard work. He mows a lot at this time of year and makes sure to take breaks frequently.  Confirmed upcoming appointment with Edison Nasuti and Dr [name] in November.   Star Rating Drugs:  None  Leeroy Bock [place]

## 2020-11-20 ENCOUNTER — Telehealth: Payer: Self-pay

## 2020-11-20 NOTE — Chronic Care Management (AMB) (Signed)
    Chronic Care Management Pharmacy Assistant   Name: William West  MRN: [id number] DOB: Dec 14, 1950  Reason for Encounter: Chart Review   Medications: Outpatient Encounter Medications as of 11/20/2020  Medication Sig Note   acetaminophen (TYLENOL) 160 MG/5ML liquid Take 325 mg by mouth every 4 (four) hours as needed for pain.    aspirin EC 81 MG tablet Take 81 mg by mouth daily as needed (afib).     clotrimazole (LOTRIMIN) 1 % cream Apply 1 application topically daily as needed (jock itch).    diltiazem (CARDIZEM CD) 120 MG 24 hr capsule Take 1 capsule by mouth once daily    docusate sodium (COLACE) 100 MG capsule Take 100 mg by mouth as needed.     etanercept (ENBREL) 50 MG/ML injection Inject 50 mg into the skin every Monday.  07/17/2019: On hold for procedure   ibuprofen (ADVIL,MOTRIN) 200 MG tablet Take 600 mg by mouth every 8 (eight) hours as needed for moderate pain.     LORazepam (ATIVAN) 1 MG tablet TAKE 1 TABLET BY MOUTH AT BEDTIME    Multiple Vitamin (MULTIVITAMIN) tablet Take 1 tablet by mouth daily.    neomycin-bacitracin-polymyxin (NEOSPORIN) ointment Apply 1 application topically as needed for wound care.    omeprazole (PRILOSEC) 40 MG capsule Take 40 mg by mouth every evening.     Probiotic Product (PROBIOTIC-10 ULTIMATE) CAPS Take by mouth.    vitamin C (ASCORBIC ACID) 500 MG tablet Take 500 mg by mouth daily.     No facility-administered encounter medications on file as of 11/20/2020.   Reviewed chart for medication changes and adherence.  No OVs, Consults, or hospital visits since last care coordination call / Pharmacist visit. No medication changes indicated  No gaps in adherence identified. No further action required.  Wilford Sports CPA, CMA

## 2020-11-22 ENCOUNTER — Encounter (HOSPITAL_COMMUNITY): Payer: Self-pay | Admitting: Emergency Medicine

## 2020-11-22 ENCOUNTER — Telehealth: Payer: Self-pay | Admitting: Internal Medicine

## 2020-11-22 ENCOUNTER — Emergency Department (HOSPITAL_COMMUNITY)
Admission: EM | Admit: 2020-11-22 | Discharge: 2020-11-23 | Disposition: A | Discharge status: Home / Self Care | Payer: Medicare Other | Attending: Emergency Medicine | Admitting: Emergency Medicine

## 2020-11-22 ENCOUNTER — Other Ambulatory Visit: Payer: Self-pay

## 2020-11-22 DIAGNOSIS — U071 COVID-19: Secondary | ICD-10-CM | POA: Insufficient documentation

## 2020-11-22 DIAGNOSIS — Z7982 Long term (current) use of aspirin: Secondary | ICD-10-CM | POA: Insufficient documentation

## 2020-11-22 DIAGNOSIS — Z87891 Personal history of nicotine dependence: Secondary | ICD-10-CM | POA: Insufficient documentation

## 2020-11-22 DIAGNOSIS — Z8679 Personal history of other diseases of the circulatory system: Secondary | ICD-10-CM | POA: Diagnosis not present

## 2020-11-22 DIAGNOSIS — Z79899 Other long term (current) drug therapy: Secondary | ICD-10-CM | POA: Diagnosis not present

## 2020-11-22 LAB — CBC WITH DIFFERENTIAL/PLATELET
Abs Immature Granulocytes: 0.04 10*3/uL (ref 0.00–0.07)
Basophils Absolute: 0 10*3/uL (ref 0.0–0.1)
Basophils Relative: 1 %
Eosinophils Absolute: 0.1 10*3/uL (ref 0.0–0.5)
Eosinophils Relative: 2 %
HCT: 41.5 % (ref 39.0–52.0)
Hemoglobin: 14 g/dL (ref 13.0–17.0)
Immature Granulocytes: 1 %
Lymphocytes Relative: 9 %
Lymphs Abs: 0.7 10*3/uL (ref 0.7–4.0)
MCH: 30 pg (ref 26.0–34.0)
MCHC: 33.7 g/dL (ref 30.0–36.0)
MCV: 89.1 fL (ref 80.0–100.0)
Monocytes Absolute: 0.7 10*3/uL (ref 0.1–1.0)
Monocytes Relative: 10 %
Neutro Abs: 5.7 10*3/uL (ref 1.7–7.7)
Neutrophils Relative %: 77 %
Platelets: 200 10*3/uL (ref 150–400)
RBC: 4.66 MIL/uL (ref 4.22–5.81)
RDW: 14.1 % (ref 11.5–15.5)
WBC: 7.2 10*3/uL (ref 4.0–10.5)
nRBC: 0 % (ref 0.0–0.2)

## 2020-11-22 LAB — COMPREHENSIVE METABOLIC PANEL
ALT: 16 U/L (ref 0–44)
AST: 21 U/L (ref 15–41)
Albumin: 3.8 g/dL (ref 3.5–5.0)
Alkaline Phosphatase: 65 U/L (ref 38–126)
Anion gap: 6 (ref 5–15)
BUN: 12 mg/dL (ref 8–23)
CO2: 26 mmol/L (ref 22–32)
Calcium: 8.7 mg/dL — ABNORMAL LOW (ref 8.9–10.3)
Chloride: 106 mmol/L (ref 98–111)
Creatinine, Ser: 1.24 mg/dL (ref 0.61–1.24)
GFR, Estimated: >60 mL/min (ref >60)
Glucose, Bld: 107 mg/dL — ABNORMAL HIGH (ref 70–99)
Potassium: 3.7 mmol/L (ref 3.5–5.1)
Sodium: 138 mmol/L (ref 135–145)
Total Bilirubin: 0.7 mg/dL (ref 0.3–1.2)
Total Protein: 6.3 g/dL — ABNORMAL LOW (ref 6.5–8.1)

## 2020-11-22 NOTE — ED Provider Notes (Signed)
Emergency Medicine Provider Triage Evaluation Note  William West , a 70 y.o. male  was evaluated in triage.  Pt complains of headache, body aches, and fevers since yesterday. Tested positive for COVID this evening around 6 PM. He called his PCP's office; they wanted to prescribe him paxlovid given he is immunocompromised due to being on Enbrel for RA however they did not have a recent creatinine on file and sent pt here for labwork. Pt denies chest pain or SOB. He has been taking Tylenol for his fever and headache with  mild relief. Pt states he walked 3 miles yesterday without issue. He is vaccinated x 4.  Review of Systems  Positive: + headache, body aches, fever Negative: - chest pain, SOB, vomiting, diarrhea  Physical Exam  BP 133/88 (BP Location: Right Arm)   Pulse 84   Temp 99.1 F (37.3 C) (Oral)   Resp 20   Ht 5\' 11"  (1.803 m)   Wt 91.6 kg   SpO2 98%   BMI 28.17 kg/m  Gen:   Awake, no distress   Resp:  Normal effort. Actively coughing.  MSK:   Moves extremities without difficulty  Other:    Medical Decision Making  Medically screening exam initiated at 8:01 PM.  Appropriate orders placed.  William West was informed that the remainder of the evaluation will be completed by another provider, this initial triage assessment does not replace that evaluation, and the importance of remaining in the ED until their evaluation is complete.     Eustaquio Maize, PA-C 11/22/20 2002    Arnaldo Natal, MD 11/22/20 2026

## 2020-11-22 NOTE — ED Triage Notes (Signed)
Pt states he tested positive for COVID today. C/o fever, coughing, and headache. Called PCP recommended ED visit for kidney function test and possible paxlovid prescription. Pt fully vaccinated with two boosters. Denies CP/SOB

## 2020-11-22 NOTE — Telephone Encounter (Signed)
Onset 7/1 temp to 100/ chills/ aches/ sneezing/ clear mucus, mild sob but sats 98%   Vax x 4 last was Jan 2022   No creat on file  Since on embrel rec go to [facility] for BMET and consideration for paxlovid rx tonight or first thing in am

## 2020-11-23 MED ORDER — FLUTICASONE PROPIONATE 50 MCG/ACT NA SUSP: 2.0000 | Freq: Every day | NASAL | 0 refills | Status: DC

## 2020-11-23 MED ORDER — MOLNUPIRAVIR EUA 200MG CAPSULE: 4.0000 | ORAL_CAPSULE | Freq: Two times a day (BID) | ORAL | 0 refills | Status: AC

## 2020-11-23 MED ORDER — BENZONATATE 100 MG PO CAPS: 100.0000 mg | ORAL_CAPSULE | Freq: Three times a day (TID) | ORAL | 0 refills | Status: DC | PRN

## 2020-11-23 NOTE — ED Provider Notes (Signed)
[facility] EMERGENCY DEPARTMENT Provider Note   CSN: [id number] Arrival date & time: 11/22/20  1952     History Chief Complaint  Patient presents with   Covid Positive    William West is a 70 y.o. male presents to the Emergency Department complaining of gradual, persistent, progressively worsening URI symptoms onset 3 days ago.  Patient reports yesterday he tested positive for COVID-19 on a rapid at-home test.  Discussed that he called his pulmonologist who recommended she come to the emergency department for blood work and Paxlovid prescription.  Patient is taking Claritin and Tylenol with moderate relief of symptoms.  Reports he sees pulmonology for a lung nodule but no other breathing problems.  He does have a history of A. fib and takes diltiazem daily.  No missed doses.  Denies chest pain, shortness of breath or palpitations.  No other aggravating or alleviating factors.  The history is provided by the patient and medical records. No language interpreter was used.      Past Medical History:  Diagnosis Date   A-fib ([place])    Acute blood loss anemia 01/26/2015   Allergic rhinitis    Diverticulosis of colon    Dysplastic nevus    GERD (gastroesophageal reflux disease)    GI bleeding 01/24/2015   Hypercholesterolemia    Postural dizziness with near syncope due to dehydration 12/09/2016   RA (rheumatoid arthritis) [facility])     Patient Active Problem List   Diagnosis Date Noted   Hyperglycemia 04/05/2019   Family history of prostate cancer 04/03/2019   GERD (gastroesophageal reflux disease), on Omeprazole 07/01/2018   History of GI bleed 07/01/2018   Insomnia 07/01/2018   Abnormal CT of the chest 03/03/2018   Sensorineural hearing loss (SNHL), bilateral 12/28/2017   Tinnitus, bilateral 12/28/2017   Pulmonary nodule, left 12/09/2016   Former smoker 01/16/2015   Atrial fibrillation ([place]), on Cardizem daily, with prn ASA and Diltiazem 04/25/2012   Rheumatoid  arthritis ([place]), on Enbrel, followed by Dr. Amil Amen 01/06/2011   Allergic rhinitis 04/22/2009   Dyslipidemia 10/24/2008   Diverticulosis of large intestine 06/21/2007    Past Surgical History:  Procedure Laterality Date   COLONOSCOPY  01/24/2015   x 3   ERCP N/A 02/18/2015   Procedure: ENDOSCOPIC RETROGRADE CHOLANGIOPANCREATOGRAPHY (ERCP);  Surgeon: Carol Ada, MD;  Location: [facility];  Service: Endoscopy;  Laterality: N/A;   GIVENS CAPSULE STUDY N/A 01/24/2015   Procedure: GIVENS CAPSULE STUDY;  Surgeon: Juanita Craver, MD;  Location: [facility];  Service: Endoscopy;  Laterality: N/A;   INGUINAL HERNIA REPAIR Right 07/27/2019   Procedure: LAPAROSCOPIC RIGHT INGUINAL HERNIA REPAIR WITH MESH;  Surgeon: Ralene Ok, MD;  Location: [facility];  Service: General;  Laterality: Right;   LAPAROSCOPIC CHOLECYSTECTOMY  2011   TOE AMPUTATION  1966   TONSILLECTOMY AND ADENOIDECTOMY         Family History  Problem Relation Age of Onset   Prostate cancer Father    Heart disease Mother    Rheum arthritis Mother     Social History   Tobacco Use   Smoking status: Former    Packs/day: 0.50    Years: 45.00    Pack years: 22.50    Types: Cigarettes    Quit date: 11/13/2009    Years since quitting: 11.0   Smokeless tobacco: Never  Vaping Use   Vaping Use: Never used  Substance Use Topics   Alcohol use: No    Alcohol/week: 0.0 standard drinks  Drug use: No    Home Medications Prior to Admission medications   Medication Sig Start Date End Date Taking? Authorizing Provider  benzonatate (TESSALON PERLES) 100 MG capsule Take 1 capsule (100 mg total) by mouth 3 (three) times daily as needed for cough (cough). 11/23/20  Yes Tylor Courtwright, Jarrett Soho, PA-C  fluticasone (FLONASE) 50 MCG/ACT nasal spray Place 2 sprays into both nostrils daily. 11/23/20  Yes Kensley Valladares, Jarrett Soho, PA-C  molnupiravir EUA 200 mg CAPS Take 4 capsules (800 mg total) by mouth 2 (two) times daily for 5 days. 11/23/20 11/28/20 Yes  Terecia Plaut, Jarrett Soho, PA-C  acetaminophen (TYLENOL) 160 MG/5ML liquid Take 325 mg by mouth every 4 (four) hours as needed for pain.    [provider]  aspirin EC 81 MG tablet Take 81 mg by mouth daily as needed (afib).     [provider]  clotrimazole (LOTRIMIN) 1 % cream Apply 1 application topically daily as needed (jock itch).    [provider]  diltiazem (CARDIZEM CD) 120 MG 24 hr capsule Take 1 capsule by mouth once daily 09/10/20   Evans Lance, MD  docusate sodium (COLACE) 100 MG capsule Take 100 mg by mouth as needed.     [provider]  etanercept (ENBREL) 50 MG/ML injection Inject 50 mg into the skin every Monday.     [provider]  ibuprofen (ADVIL,MOTRIN) 200 MG tablet Take 600 mg by mouth every 8 (eight) hours as needed for moderate pain.     [provider]  LORazepam (ATIVAN) 1 MG tablet TAKE 1 TABLET BY MOUTH AT BEDTIME 11/11/20   [name], MD  Multiple Vitamin (MULTIVITAMIN) tablet Take 1 tablet by mouth daily.    [provider]  neomycin-bacitracin-polymyxin (NEOSPORIN) ointment Apply 1 application topically as needed for wound care.    [provider]  omeprazole (PRILOSEC) 40 MG capsule Take 40 mg by mouth every evening.     [provider]  Probiotic Product (PROBIOTIC-10 ULTIMATE) CAPS Take by mouth.    [provider]  vitamin C (ASCORBIC ACID) 500 MG tablet Take 500 mg by mouth daily.     [provider]    Allergies    Caffeine, Flecainide, Methotrexate derivatives, Pseudoephedrine-ibuprofen, and Penicillins  Review of Systems   Review of Systems  Constitutional:  Positive for fatigue and fever. Negative for appetite change, diaphoresis and unexpected weight change.  HENT:  Positive for congestion, rhinorrhea and sore throat. Negative for mouth sores.   Eyes:  Negative for visual disturbance.  Respiratory:  Positive for cough. Negative for chest tightness,  shortness of breath and wheezing.   Cardiovascular:  Negative for chest pain.  Gastrointestinal:  Negative for abdominal pain, constipation, diarrhea, nausea and vomiting.  Endocrine: Negative for polydipsia, polyphagia and polyuria.  Genitourinary:  Negative for dysuria, frequency, hematuria and urgency.  Musculoskeletal:  Positive for myalgias. Negative for back pain and neck stiffness.  Skin:  Negative for rash.  Allergic/Immunologic: Negative for immunocompromised state.  Neurological:  Positive for headaches. Negative for syncope and light-headedness.  Hematological:  Does not bruise/bleed easily.  Psychiatric/Behavioral:  Negative for sleep disturbance. The patient is not nervous/anxious.    Physical Exam Updated Vital Signs BP 123/79   Pulse 77   Temp 99.5 F (37.5 C) (Oral)   Resp 16   Ht 5\' 11"  (1.803 m)   Wt 91.6 kg   SpO2 99%   BMI 28.17 kg/m   Physical Exam Vitals and nursing note reviewed.  Constitutional:      General: He is not in acute distress.    Appearance: He is not diaphoretic.  HENT:     Head: Normocephalic.  Eyes:     General: No scleral icterus.    Conjunctiva/sclera: Conjunctivae normal.  Cardiovascular:     Rate and Rhythm: Normal rate and regular rhythm.     Pulses: Normal pulses.          Radial pulses are 2+ on the right side and 2+ on the left side.  Pulmonary:     Effort: Pulmonary effort is normal. No tachypnea, accessory muscle usage, prolonged expiration, respiratory distress or retractions.     Breath sounds: Normal breath sounds. No stridor.     Comments: Equal chest rise. No increased work of breathing. Abdominal:     General: There is no distension.     Palpations: Abdomen is soft.     Tenderness: There is no abdominal tenderness. There is no guarding or rebound.  Musculoskeletal:     Cervical back: Normal range of motion.     Comments: Moves all extremities equally and without difficulty.  Skin:    General: Skin is warm and  dry.     Capillary Refill: Capillary refill takes less than 2 seconds.  Neurological:     Mental Status: He is alert.     GCS: GCS eye subscore is 4. GCS verbal subscore is 5. GCS motor subscore is 6.     Comments: Speech is clear and goal oriented.  Psychiatric:        Mood and Affect: Mood normal.    ED Results / Procedures / Treatments   Labs (all labs ordered are listed, but only abnormal results are displayed) Labs Reviewed  COMPREHENSIVE METABOLIC PANEL - Abnormal; Notable for the following components:      Result Value   Glucose, Bld 107 (*)    Calcium 8.7 (*)    Total Protein 6.3 (*)    All other components within normal limits  CBC WITH DIFFERENTIAL/PLATELET     Procedures Procedures   Medications Ordered in ED Medications - No data to display  ED Course  I have reviewed the triage vital signs and the nursing notes.  Pertinent labs & imaging results that were available during my care of the patient were reviewed by me and considered in my medical decision making (see chart for details).    MDM Rules/Calculators/A&P                          Patient presents for prescription of Paxlovid.  Though his serum creatinine is within normal limits for prescription, he is taking Cardizem which is contraindicated.  Discussed with in-house pharmacy recommended antibiotics or Molnupiravir.  Patient is well-appearing.  I believe he will do well with oral antivirals.  Prescription sent to the pharmacy.  Discussed close primary care follow-up and reasons to return to the emergency department.  Patient states understanding and is in agreement with the plan.   Final Clinical Impression(s) / ED Diagnoses Final diagnoses:  COVID-19    Rx / DC Orders ED Discharge Orders          Ordered    molnupiravir EUA 200 mg CAPS  2 times daily        11/23/20 0535    fluticasone (FLONASE) 50 MCG/ACT nasal spray  Daily        11/23/20 0536    benzonatate (TESSALON PERLES) 100  MG capsule   3 times daily PRN        11/23/20 0536             Estalee Mccandlish, Jarrett Soho, PA-C 11/23/20 0541    Orpah Greek, MD 11/23/20 [phone]

## 2020-11-23 NOTE — Discharge Instructions (Addendum)
1. Medications: Alternate tylenol and ibuprofen for fever control, flonase for nasal congestion; tessalon for cough, molnupiravir is the antiviral; continue usual home medications 2. Treatment: rest, drink plenty of fluids, isolate for the next 10 days 3. Follow Up: Please followup with your primary doctor if your symptoms are not improving after 10-14 days; Please return to the ER for high fevers, persistent vomiting, shortness of breath or other concerns.

## 2020-11-25 ENCOUNTER — Telehealth: Payer: Self-pay | Admitting: Emergency Medicine

## 2020-11-25 NOTE — Telephone Encounter (Signed)
Patient is aware of below recommendations and voiced his understanding.  °Nothing further needed at this time.  ° °

## 2020-11-25 NOTE — Telephone Encounter (Signed)
Spoke to patient, who stated he tested positive with home covid test on 11/22/2020. Patient seen at ED on 11/22/2020 and was prescribed molnupiravir 200mg  4 tabs BID, tessalon TID and Flonase daily .  He has taken 2 fulls doses of molnupiravir and one partial dose this am with some relief in sx. He does not have supplemental oxygen. Monitoring oxygen levels, Spo2 is maintaining around 98%. C/o sore throat, mild productive cough with clear sputum, chills and body aches. Fever of 100 yesterday.   He is overall feeling better today but wants to be sure that nothing else needs to be prescribed.  Fully vaccinated against covid. 2 shots and 2 boosters.    Dr. Erin Fulling please advise.  William West is unavailable.

## 2020-11-27 ENCOUNTER — Telehealth: Payer: Self-pay | Admitting: Emergency Medicine

## 2020-11-27 DIAGNOSIS — U071 COVID-19: Secondary | ICD-10-CM | POA: Diagnosis not present

## 2020-11-27 DIAGNOSIS — Z23 Encounter for immunization: Secondary | ICD-10-CM | POA: Diagnosis not present

## 2020-11-27 NOTE — Telephone Encounter (Signed)
ATC left voicemail to return call to office.

## 2020-11-27 NOTE — Telephone Encounter (Signed)
Primary Pulmonologist: Dr. Lamonte Sakai Last office visit and with whom: 02/11/20 with Dr. Lamonte Sakai What do we see them for (pulmonary problems): Pulmonary Nodule, Abn Ct of chest, former smoker Last OV assessment/plan: see below  Was appointment offered to patient (explain)?     Assessment & Plan:  Abnormal CT of the chest His pulmonary nodules are stable in size and appearance, no evidence for malignancy.  Likely related to rheumatoid.  They have been stable since 2018 I do not feel that we need to schedule follow-up CT at this time unless he has a clinical change.  Should be able to space out his surveillance, just check a chest x-ray next time.   We will plan to follow-up in 1 year with a chest x-ray.  Please call if you have any problems, any change in your breathing, and we will see you sooner. Your pneumonia shots and your Covid shots are up-to-date Please continue to practice good social distancing and wearing a mask, wash hands You need the flu shot today.   Former smoker With suspected mild COPD.  He is asymptomatic, has never needed bronchodilators.  We will discuss a possible bronchodilator regimen if there is a clinical change.   Baltazar Apo, MD, PhD 02/11/2020, 12:05 PM [facility] [phone] or if no answer [phone]      Reason for call: patient called in regards to Covid infusion. Patient was seen in ED per Dr. Melvyn Novas on Saturday 11/22/20 and tested positive for Covid. Was having fever, chills, body aches, sneezing and headache. Was not given Paxlovid due to being on Cardizem and was given Molnupiravir which he finished yesterday. Patient is feeling some better but still coughing with occ yellow mucous and hoarseness. Patient stated [facility] in [place] is offering covid infusion for him and is wanting to make sure he is ok to get it. Will route to provider of the day since Dr. Lamonte Sakai is on vacation.  Tammy, please advise. Thanks!  (examples of things to  ask: : When did symptoms start? Fever? Cough? Productive? Color to sputum? More sputum than usual? Wheezing? Have you needed increased oxygen? Are you taking your respiratory medications? What over the counter measures have you tried?)  Allergies  Allergen Reactions   Caffeine Other (See Comments)    Causes him to go into afib   Flecainide Other (See Comments)    Caused heart to race too fast and had him in the ED   Methotrexate Derivatives Nausea And Vomiting    Increased liver enzymes   Pseudoephedrine-Ibuprofen Other (See Comments)    " afib"    Penicillins Rash    Did it involve swelling of the face/tongue/throat, SOB, or low BP? No Did it involve sudden or severe rash/hives, skin peeling, or any reaction on the inside of your mouth or nose? No Did you need to seek medical attention at a hospital or doctor's office? No When did it last happen?      40 + years If all above answers are "NO", may proceed with cephalosporin use.     Immunization History  Administered Date(s) Administered   Fluad Quad(high Dose 65+) 02/11/2020   Influenza Split 02/22/2011, 03/14/2012, 03/16/2013, 02/08/2014, 02/22/2015   Influenza, High Dose Seasonal PF 02/21/2017, 03/02/2018, 01/17/2019   Influenza,inj,Quad PF,6+ Mos 01/21/2016   PFIZER(Purple Top)SARS-COV-2 Vaccination 06/28/2019, 07/19/2019, 01/07/2020   Pneumococcal Conjugate-13 08/22/2017   Pneumococcal Polysaccharide-23 10/24/2008, 01/26/2019   Td 09/21/2001, 10/24/2008   Tdap 03/07/2014, 03/30/2018   Zoster  Recombinat (Shingrix) 03/13/2020

## 2020-11-27 NOTE — Telephone Encounter (Signed)
He is immunosuppressed , has COPD and Age  Fully vaccinated and boosted.   If still symptomatic and infusion team qualifies him for MAB that is fine to proceed .  Has he finished the Molnupiravir ? Make sure he let them know he took this   Make sure he does a follow up in office or PCP in next 2 weeks for post covid check up .   Please contact office for sooner follow up if symptoms do not improve or worsen or seek emergency care

## 2020-11-27 NOTE — Telephone Encounter (Signed)
Patient returned call and I was able to speak with him, he states he has 4 tablets left of the Molnupiravir for tonight and that will complete the prescription.  He received a call from [facility] regarding the infusion and he received the infusion of Bebtelovimab today at 11 am.   Advised of recommendations per Rexene Edison NP and he verbalized understanding.  He has a f/u planned with Dr. Lamonte Sakai in November, however, will schedule something for 2 weeks with our office.  Nothing further needed.

## 2020-12-29 DIAGNOSIS — H43811 Vitreous degeneration, right eye: Secondary | ICD-10-CM | POA: Diagnosis not present

## 2021-01-13 ENCOUNTER — Telehealth: Payer: Self-pay | Admitting: Pharmacist

## 2021-01-13 NOTE — Chronic Care Management (AMB) (Addendum)
    Chronic Care Management Pharmacy Assistant   Name: William West  MRN: [id number] DOB: [birth year]   Reason for Encounter: General Adherence Call    Recent office visits:  11/27/2020 [name]; no further information available  Recent consult visits:  None  Hospital visits:  None in previous 6 months  Medications: Outpatient Encounter Medications as of 01/13/2021  Medication Sig Note   acetaminophen (TYLENOL) 160 MG/5ML liquid Take 325 mg by mouth every 4 (four) hours as needed for pain.    aspirin EC 81 MG tablet Take 81 mg by mouth daily as needed (afib).     benzonatate (TESSALON PERLES) 100 MG capsule Take 1 capsule (100 mg total) by mouth 3 (three) times daily as needed for cough (cough).    clotrimazole (LOTRIMIN) 1 % cream Apply 1 application topically daily as needed (jock itch).    diltiazem (CARDIZEM CD) 120 MG 24 hr capsule Take 1 capsule by mouth once daily    docusate sodium (COLACE) 100 MG capsule Take 100 mg by mouth as needed.     etanercept (ENBREL) 50 MG/ML injection Inject 50 mg into the skin every Monday.  07/17/2019: On hold for procedure   fluticasone (FLONASE) 50 MCG/ACT nasal spray Place 2 sprays into both nostrils daily.    ibuprofen (ADVIL,MOTRIN) 200 MG tablet Take 600 mg by mouth every 8 (eight) hours as needed for moderate pain.     LORazepam (ATIVAN) 1 MG tablet TAKE 1 TABLET BY MOUTH AT BEDTIME    Multiple Vitamin (MULTIVITAMIN) tablet Take 1 tablet by mouth daily.    neomycin-bacitracin-polymyxin (NEOSPORIN) ointment Apply 1 application topically as needed for wound care.    omeprazole (PRILOSEC) 40 MG capsule Take 40 mg by mouth every evening.     Probiotic Product (PROBIOTIC-10 ULTIMATE) CAPS Take by mouth.    vitamin C (ASCORBIC ACID) 500 MG tablet Take 500 mg by mouth daily.     No facility-administered encounter medications on file as of 01/13/2021.   Patient Questions: Have you had any problems recently with your  health? Patients wife states the patient recently had covid-19. She states he had an infusion last month and is feeling much better. She states the patient did not have any long term effects so far.  Have you had any problems with your pharmacy? Patient wife states the patient has not had any problems recently with his pharmacy.  What issues or side effects are you having with your medications? Patients wife states the patient is not currently having any issues or side effects with any of his medications.  What would you like me to pass along to [name], CPP for him to help you with?  Patients wife did not have anything to pass along for the patient at this time.  What can we do to take care of you better? Patients wife did not have any suggestions.  Future Appointments  Date Time Provider [place]  04/06/2021  8:20 AM [name], MD [facility]    Star Rating Drugs: None  April D Calhoun, [place] Pharmacist Assistant [phone]

## 2021-01-19 DIAGNOSIS — E663 Overweight: Secondary | ICD-10-CM | POA: Diagnosis not present

## 2021-01-19 DIAGNOSIS — M0579 Rheumatoid arthritis with rheumatoid factor of multiple sites without organ or systems involvement: Secondary | ICD-10-CM | POA: Diagnosis not present

## 2021-01-19 DIAGNOSIS — Z79899 Other long term (current) drug therapy: Secondary | ICD-10-CM | POA: Diagnosis not present

## 2021-01-19 DIAGNOSIS — Z6827 Body mass index (BMI) 27.0-27.9, adult: Secondary | ICD-10-CM | POA: Diagnosis not present

## 2021-02-09 DIAGNOSIS — Z23 Encounter for immunization: Secondary | ICD-10-CM | POA: Diagnosis not present

## 2021-02-13 DIAGNOSIS — D225 Melanocytic nevi of trunk: Secondary | ICD-10-CM | POA: Diagnosis not present

## 2021-02-13 DIAGNOSIS — L570 Actinic keratosis: Secondary | ICD-10-CM | POA: Diagnosis not present

## 2021-02-13 DIAGNOSIS — L814 Other melanin hyperpigmentation: Secondary | ICD-10-CM | POA: Diagnosis not present

## 2021-02-13 DIAGNOSIS — D1801 Hemangioma of skin and subcutaneous tissue: Secondary | ICD-10-CM | POA: Diagnosis not present

## 2021-03-05 ENCOUNTER — Ambulatory Visit: Payer: Medicare Other | Admitting: Emergency Medicine

## 2021-03-09 DIAGNOSIS — Z23 Encounter for immunization: Secondary | ICD-10-CM | POA: Diagnosis not present

## 2021-03-17 ENCOUNTER — Encounter: Payer: Self-pay | Admitting: Internal Medicine

## 2021-03-17 ENCOUNTER — Other Ambulatory Visit: Payer: Self-pay

## 2021-03-17 ENCOUNTER — Ambulatory Visit (INDEPENDENT_AMBULATORY_CARE_PROVIDER_SITE_OTHER): Payer: Medicare Other | Admitting: Internal Medicine

## 2021-03-17 VITALS — BP 136/78 | HR 74 | Ht 71.5 in | Wt 200.0 lb

## 2021-03-17 DIAGNOSIS — I480 Paroxysmal atrial fibrillation: Secondary | ICD-10-CM

## 2021-03-17 MED ORDER — DILTIAZEM HCL ER COATED BEADS 120 MG PO CP24
120.0000 mg | ORAL_CAPSULE | Freq: Every day | ORAL | 3 refills | Status: DC
Start: 2021-03-17 — End: 2022-04-21

## 2021-03-17 NOTE — Progress Notes (Signed)
HPI William West returns today for follow-up and for evaluation of syncope. He is a pleasant 70 year old man with paroxysmal atrial fibrillation, and relative intolerance to flecainide. The patient has been well-controlled with regard to his atrial fibrillation in the past. He has had episodes of syncope which were thought to be mostly related to autonomic dysfunction. He has had some GI bleeding in the past. He feels well. He has had 3 episodes of atrial fib over the past year.  Allergies  Allergen Reactions   Caffeine Other (See Comments)    Causes him to go into afib   Flecainide Other (See Comments)    Caused heart to race too fast and had him in the ED   Methotrexate Derivatives Nausea And Vomiting    Increased liver enzymes   Pseudoephedrine-Ibuprofen Other (See Comments)    " afib"    Penicillins Rash    Did it involve swelling of the face/tongue/throat, SOB, or low BP? No Did it involve sudden or severe rash/hives, skin peeling, or any reaction on the inside of your mouth or nose? No Did you need to seek medical attention at a hospital or doctor's office? No When did it last happen?      40 + years If all above answers are "NO", may proceed with cephalosporin use.      Current Outpatient Medications  Medication Sig Dispense Refill   acetaminophen (TYLENOL) 160 MG/5ML liquid Take 325 mg by mouth every 4 (four) hours as needed for pain.     aspirin EC 81 MG tablet Take 81 mg by mouth daily as needed (afib).      benzonatate (TESSALON PERLES) 100 MG capsule Take 1 capsule (100 mg total) by mouth 3 (three) times daily as needed for cough (cough). 20 capsule 0   clotrimazole (LOTRIMIN) 1 % cream Apply 1 application topically daily as needed (jock itch).     diltiazem (CARDIZEM CD) 120 MG 24 hr capsule Take 1 capsule by mouth once daily 90 capsule 1   docusate sodium (COLACE) 100 MG capsule Take 100 mg by mouth as needed.      etanercept (ENBREL) 50 MG/ML injection Inject  50 mg into the skin every Monday.      fluticasone (FLONASE) 50 MCG/ACT nasal spray Place 2 sprays into both nostrils daily. 9.9 g 0   ibuprofen (ADVIL,MOTRIN) 200 MG tablet Take 600 mg by mouth every 8 (eight) hours as needed for moderate pain.      LORazepam (ATIVAN) 1 MG tablet TAKE 1 TABLET BY MOUTH AT BEDTIME 30 tablet 0   Multiple Vitamin (MULTIVITAMIN) tablet Take 1 tablet by mouth daily.     neomycin-bacitracin-polymyxin (NEOSPORIN) ointment Apply 1 application topically as needed for wound care.     omeprazole (PRILOSEC) 40 MG capsule Take 40 mg by mouth every evening.      Probiotic Product (PROBIOTIC-10 ULTIMATE) CAPS Take by mouth.     vitamin C (ASCORBIC ACID) 500 MG tablet Take 500 mg by mouth daily.      No current facility-administered medications for this visit.     Past Medical History:  Diagnosis Date   A-fib ([place])    Acute blood loss anemia 01/26/2015   Allergic rhinitis    Diverticulosis of colon    Dysplastic nevus    GERD (gastroesophageal reflux disease)    GI bleeding 01/24/2015   Hypercholesterolemia    Postural dizziness with near syncope due to dehydration 12/09/2016   RA (rheumatoid  arthritis) ([place])     ROS:   All systems reviewed and negative except as noted in the HPI.   Past Surgical History:  Procedure Laterality Date   COLONOSCOPY  01/24/2015   x 3   ERCP N/A 02/18/2015   Procedure: ENDOSCOPIC RETROGRADE CHOLANGIOPANCREATOGRAPHY (ERCP);  Surgeon: Carol Ada, MD;  Location: [facility];  Service: Endoscopy;  Laterality: N/A;   GIVENS CAPSULE STUDY N/A 01/24/2015   Procedure: GIVENS CAPSULE STUDY;  Surgeon: Juanita Craver, MD;  Location: [place];  Service: Endoscopy;  Laterality: N/A;   INGUINAL HERNIA REPAIR Right 07/27/2019   Procedure: LAPAROSCOPIC RIGHT INGUINAL HERNIA REPAIR WITH MESH;  Surgeon: Ralene Ok, MD;  Location: [place];  Service: General;  Laterality: Right;   LAPAROSCOPIC CHOLECYSTECTOMY  2011   TOE AMPUTATION  1966    TONSILLECTOMY AND ADENOIDECTOMY       Family History  Problem Relation Age of Onset   Prostate cancer Father    Heart disease Mother    Rheum arthritis Mother      Social History   Socioeconomic History   Marital status: Married    Spouse name: William West   Number of children: 2   Years of education: Not on file   Highest education level: Not on file  Occupational History   Occupation: [employer]    Comment: RETIRED  Tobacco Use   Smoking status: Former    Packs/day: 0.50    Years: 45.00    Pack years: 22.50    Types: Cigarettes    Quit date: 11/13/2009    Years since quitting: 11.3   Smokeless tobacco: Never  Vaping Use   Vaping Use: Never used  Substance and Sexual Activity   Alcohol use: No    Alcohol/week: 0.0 standard drinks   Drug use: No   Sexual activity: Never  Other Topics Concern   Not on file  Social History Narrative   One son with HBP. One son with IDDM. Exercises regularly at the [facility]. Eats well. Down 30 pounds with the lifestyle change. Exercise keeps him from feeling so stiff. Weighs daily. Baseline 199-204.    Social Determinants of Radio broadcast assistant Strain: Not on file  Food Insecurity: No Food Insecurity   Worried About Charity fundraiser in the Last Year: Never true   Ran Out of Food in the Last Year: Never true  Transportation Needs: Not on file  Physical Activity: Not on file  Stress: Not on file  Social Connections: Not on file  Intimate Partner Violence: Not on file     BP 136/78   Pulse 74   Ht 5' 11.5" (1.816 m)   Wt 200 lb (90.7 kg)   BMI 27.51 kg/m   Physical Exam:  Well appearing NAD HEENT: Unremarkable Neck:  No JVD, no thyromegally Lymphatics:  No adenopathy Back:  No CVA tenderness Lungs:  Clear with no wheezes HEART:  Regular rate rhythm, no murmurs, no rubs, no clicks Abd:  soft, positive bowel sounds, no organomegally, no rebound, no guarding Ext:  2 plus pulses, no edema, no cyanosis, no clubbing Skin:   No rashes no nodules Neuro:  CN II through XII intact, motor grossly intact  EKG - nsr   Assess/Plan:  1. PAF -  his atrial fib has been well controlled. He will continue cardizem. His stroke risk is low. 2. Autonomic dysfunction - he has been asymptomatic. No syncope.    Carleene Overlie Caesar Mannella,MD

## 2021-03-17 NOTE — Patient Instructions (Signed)
Medication Instructions:  Your physician recommends that you continue on your current medications as directed. Please refer to the Current Medication list given to you today.  *If you need a refill on your cardiac medications before your next appointment, please call your pharmacy*   Lab Work: NONE   If you have labs (blood work) drawn today and your tests are completely normal, you will receive your results only by: . MyChart Message (if you have MyChart) OR . A paper copy in the mail If you have any lab test that is abnormal or we need to change your treatment, we will call you to review the results.   Testing/Procedures: NONE    Follow-Up: At [facility], you and your health needs are our priority.  As part of our continuing mission to provide you with exceptional heart care, we have created designated Provider Care Teams.  These Care Teams include your primary Cardiologist (physician) and Advanced Practice Providers (APPs -  Physician Assistants and Nurse Practitioners) who all work together to provide you with the care you need, when you need it.  We recommend signing up for the patient portal called "MyChart".  Sign up information is provided on this After Visit Summary.  MyChart is used to connect with patients for Virtual Visits (Telemedicine).  Patients are able to view lab/test results, encounter notes, upcoming appointments, etc.  Non-urgent messages can be sent to your provider as well.   To learn more about what you can do with MyChart, go to [email or web address].    Your next appointment:   1 year(s)  The format for your next appointment:   In Person  Provider:   Gregg Taylor, MD   Other Instructions Thank you for choosing [facility]!    

## 2021-03-19 ENCOUNTER — Ambulatory Visit (INDEPENDENT_AMBULATORY_CARE_PROVIDER_SITE_OTHER): Payer: Medicare Other

## 2021-03-19 ENCOUNTER — Encounter: Payer: Self-pay | Admitting: Emergency Medicine

## 2021-03-19 ENCOUNTER — Ambulatory Visit (INDEPENDENT_AMBULATORY_CARE_PROVIDER_SITE_OTHER): Payer: Medicare Other | Admitting: Emergency Medicine

## 2021-03-19 ENCOUNTER — Other Ambulatory Visit: Payer: Self-pay

## 2021-03-19 VITALS — BP 108/72 | HR 61 | Ht 71.5 in | Wt 203.2 lb

## 2021-03-19 DIAGNOSIS — R911 Solitary pulmonary nodule: Secondary | ICD-10-CM

## 2021-03-19 DIAGNOSIS — R9389 Abnormal findings on diagnostic imaging of other specified body structures: Secondary | ICD-10-CM | POA: Diagnosis not present

## 2021-03-19 DIAGNOSIS — Z87891 Personal history of nicotine dependence: Secondary | ICD-10-CM | POA: Diagnosis not present

## 2021-03-19 NOTE — Assessment & Plan Note (Signed)
With probable mild COPD.  He has never required bronchodilator therapy.  We will continue to follow him clinically and consider initiating if he notices a functional decline, increased respiratory symptoms

## 2021-03-19 NOTE — Assessment & Plan Note (Signed)
Stable pulmonary nodular disease, no change on chest x-ray from today.  We will continue to follow clinically and with intermittent chest x-rays.

## 2021-03-19 NOTE — Progress Notes (Signed)
   Subjective:    Patient ID: William West, male    DOB: [birth year], 70 y.o.   MRN: [id number]  HPI  ROV 02/11/20 --70 year old man, former smoker, with mild COPD not currently on bronchodilator therapy also with rheumatoid arthritis on weekly Enbrel, previously on methotrexate.  He has pulmonary nodular disease that we have been following with serial CT scans of the chest.  These have been stable going back to July 2018.  He had a repeat scan done on 01/07/2020 which I have reviewed, shows that his 7 x 9 mm left lower lobe nodule is grossly unchanged since 2018, central left upper lobe nodule no longer cavitary again unchanged.   He is feeling well. No CP, does have some mild daily cough. Remains active.  COVID vaccine and pneumonia vaccines up to date. Due for flu shot.   ROV 03/19/21 --follow-up visit 70 year old gentleman, former smoker with mild COPD.  He has rheumatoid arthritis and is on weekly Enbrel (previously MTX).  We have been following pulmonary nodular disease going back to 11/2016 (stable).  He had COVID-19 in July 2022, treated with mulnupiravir and m-Ab. He reports that he has a terrible HA and sore throat, but that he recovered to baseline. He has a good functional capacity. Deals w some sciatica but his joint disease is well controlled. No cough. Off flonase NS, tessalon.   Chest x-ray performed today reviewed by me, shows clear lungs, no new infiltrates or pulmonary nodules  Review of Systems As per HPI     Objective:   Physical Exam Vitals:   03/19/21 1509  BP: 108/72  Pulse: 61  SpO2: 98%  Weight: 203 lb 3.2 oz (92.2 kg)  Height: 5' 11.5" (1.816 m)   Gen: Pleasant, well-nourished, in no distress,  normal affect  ENT: No lesions,  mouth clear,  oropharynx clear, no postnasal drip  Neck: No JVD, no stridor  Lungs: No use of accessory muscles, no crackles or wheezing on normal respiration, no wheeze on forced expiration  Cardiovascular: RRR, heart sounds  normal, no murmur or gallops, no peripheral edema  Musculoskeletal: No deformities, no cyanosis or clubbing  Neuro: alert, awake, non focal  Skin: Warm, no lesions or rash      Assessment & Plan:  Abnormal CT of the chest Stable pulmonary nodular disease, no change on chest x-ray from today.  We will continue to follow clinically and with intermittent chest x-rays.  Pulmonary nodule, left Stable pulmonary nodular disease, no change on chest x-ray from today.  We will continue to follow clinically and with intermittent chest x-rays.  Former smoker With probable mild COPD.  He has never required bronchodilator therapy.  We will continue to follow him clinically and consider initiating if he notices a functional decline, increased respiratory symptoms  Baltazar Apo, MD, PhD 03/19/2021, 3:28 PM  Pulmonary and Critical Care [phone] or if no answer [phone]

## 2021-03-19 NOTE — Patient Instructions (Signed)
We reviewed your chest x-ray today.  No concerning changes, no new pulmonary nodules. We will not start any inhaled medication at this time. Follow Dr. Lamonte Sakai in 1 year or sooner if any problems.

## 2021-03-24 ENCOUNTER — Telehealth: Payer: Medicare Other

## 2021-04-06 ENCOUNTER — Ambulatory Visit (INDEPENDENT_AMBULATORY_CARE_PROVIDER_SITE_OTHER): Payer: Medicare Other | Admitting: Family Medicine

## 2021-04-06 ENCOUNTER — Other Ambulatory Visit: Payer: Self-pay

## 2021-04-06 ENCOUNTER — Encounter: Payer: Self-pay | Admitting: Family Medicine

## 2021-04-06 VITALS — BP 131/82 | HR 62 | Temp 97.7°F | Ht 71.5 in | Wt 201.4 lb

## 2021-04-06 DIAGNOSIS — M069 Rheumatoid arthritis, unspecified: Secondary | ICD-10-CM

## 2021-04-06 DIAGNOSIS — Z8042 Family history of malignant neoplasm of prostate: Secondary | ICD-10-CM | POA: Diagnosis not present

## 2021-04-06 DIAGNOSIS — R351 Nocturia: Secondary | ICD-10-CM | POA: Diagnosis not present

## 2021-04-06 DIAGNOSIS — R739 Hyperglycemia, unspecified: Secondary | ICD-10-CM

## 2021-04-06 DIAGNOSIS — I480 Paroxysmal atrial fibrillation: Secondary | ICD-10-CM | POA: Diagnosis not present

## 2021-04-06 DIAGNOSIS — K219 Gastro-esophageal reflux disease without esophagitis: Secondary | ICD-10-CM

## 2021-04-06 DIAGNOSIS — F5101 Primary insomnia: Secondary | ICD-10-CM | POA: Diagnosis not present

## 2021-04-06 DIAGNOSIS — E785 Hyperlipidemia, unspecified: Secondary | ICD-10-CM | POA: Diagnosis not present

## 2021-04-06 LAB — HEMOGLOBIN A1C: Hgb A1c MFr Bld: 5.1 % (ref 4.6–6.5)

## 2021-04-06 LAB — LIPID PANEL
Cholesterol: 146 mg/dL (ref 0–200)
HDL: 53.7 mg/dL (ref >39.00)
LDL Cholesterol: 77 mg/dL (ref 0–99)
NonHDL: 92.77
Total CHOL/HDL Ratio: 3
Triglycerides: 77 mg/dL (ref 0.0–149.0)
VLDL: 15.4 mg/dL (ref 0.0–40.0)

## 2021-04-06 LAB — CBC
HCT: 43 % (ref 39.0–52.0)
Hemoglobin: 14.5 g/dL (ref 13.0–17.0)
MCHC: 33.7 g/dL (ref 30.0–36.0)
MCV: 85.7 fl (ref 78.0–100.0)
Platelets: 216 10*3/uL (ref 150.0–400.0)
RBC: 5.01 Mil/uL (ref 4.22–5.81)
RDW: 14 % (ref 11.5–15.5)
WBC: 5.7 10*3/uL (ref 4.0–10.5)

## 2021-04-06 LAB — COMPREHENSIVE METABOLIC PANEL
ALT: 13 U/L (ref 0–53)
AST: 17 U/L (ref 0–37)
Albumin: 4.4 g/dL (ref 3.5–5.2)
Alkaline Phosphatase: 62 U/L (ref 39–117)
BUN: 13 mg/dL (ref 6–23)
CO2: 29 mEq/L (ref 19–32)
Calcium: 9.5 mg/dL (ref 8.4–10.5)
Chloride: 104 mEq/L (ref 96–112)
Creatinine, Ser: 1.32 mg/dL (ref 0.40–1.50)
GFR: 54.94 mL/min — ABNORMAL LOW (ref >60.00)
Glucose, Bld: 94 mg/dL (ref 70–99)
Potassium: 4.8 mEq/L (ref 3.5–5.1)
Sodium: 140 mEq/L (ref 135–145)
Total Bilirubin: 1 mg/dL (ref 0.2–1.2)
Total Protein: 6.9 g/dL (ref 6.0–8.3)

## 2021-04-06 LAB — PSA: PSA: 1.34 ng/mL (ref 0.10–4.00)

## 2021-04-06 LAB — TSH: TSH: 2.79 u[IU]/mL (ref 0.35–5.50)

## 2021-04-06 NOTE — Progress Notes (Addendum)
Chief Complaint:  William West is a 70 y.o. male who presents today for his annual comprehensive physical exam.    Assessment/Plan:  Chronic Problems Addressed Today: Dyslipidemia Check labs.   Atrial fibrillation ([place]), on Cardizem daily, with prn ASA and Diltiazem Follows with cardiology. On aspirin and cardizem.   Hyperglycemia Check A1c.  Family history of prostate cancer Check PSA.   Rheumatoid arthritis ([place]), on Enbrel, followed by Dr. Mitzie Na on embrel per rheumatology.   Insomnia Stable. Weaning off ativan over the last several months. Still uses very occasionally. Uses melatonin which works well.   GERD (gastroesophageal reflux disease), on Omeprazole Stable on prilosec per GI.    Preventative Healthcare: Check Labs. UTD on vaccines and screenings.   Patient Counseling(The following topics were reviewed and/or handout was given):  -Nutrition: Stressed importance of moderation in sodium/caffeine intake, saturated fat and cholesterol, caloric balance, sufficient intake of fresh fruits, vegetables, and fiber.  -Stressed the importance of regular exercise.   -Substance Abuse: Discussed cessation/primary prevention of tobacco, alcohol, or other drug use; driving or other dangerous activities under the influence; availability of treatment for abuse.   -Injury prevention: Discussed safety belts, safety helmets, smoke detector, smoking near bedding or upholstery.   -Sexuality: Discussed sexually transmitted diseases, partner selection, use of condoms, avoidance of unintended pregnancy and contraceptive alternatives.   -Dental health: Discussed importance of regular tooth brushing, flossing, and dental visits.  -Health maintenance and immunizations reviewed. Please refer to Health maintenance section.  Return to care in 1 year for next preventative visit.     Subjective:  HPI:  He has no acute complaints today.   He states that he had tested positive in  July for covid. Additionally, he had gone to the ER for blood testing to see if he could be prescribed Paxlovid. He denies having long term symptoms.  Additionally he states that he avoids caffeine and chocolate as to not trigger his A fib. He is compliant with all medication with no side effects. However, he has mostly cut out using his Lorazepam prescription, using Melatonin as replacement. He occasionally still takes Lorazepam but only as needed.  He states that he has a FMHx of gastric issues with his father and grandfather.  Lifestyle Diet: Reasonably healthy diet Exercise: Golfs on Sundays, goes to [facility] 3-4x a week, walks 1-2 miles and light weightlifting there.  Depression screen PHQ 2/9 04/06/2021  Decreased Interest 0  Down, Depressed, Hopeless 0  PHQ - 2 Score 0  Altered sleeping -  Tired, decreased energy -  Change in appetite -  Feeling bad or failure about yourself  -  Trouble concentrating -  Moving slowly or fidgety/restless -  Suicidal thoughts -  PHQ-9 Score -  Difficult doing work/chores -    Health Maintenance Due  Topic Date Due   Zoster Vaccines- Shingrix (2 of 2) 05/08/2020     ROS: Per HPI, otherwise a complete review of systems was negative.   PMH:  The following were reviewed and entered/updated in epic: Past Medical History:  Diagnosis Date   A-fib ([place])    Acute blood loss anemia 01/26/2015   Allergic rhinitis    Diverticulosis of colon    Dysplastic nevus    GERD (gastroesophageal reflux disease)    GI bleeding 01/24/2015   Hypercholesterolemia    Postural dizziness with near syncope due to dehydration 12/09/2016   RA (rheumatoid arthritis) [facility])    Patient Active Problem List   Diagnosis  Date Noted   Hyperglycemia 04/05/2019   Family history of prostate cancer 04/03/2019   GERD (gastroesophageal reflux disease), on Omeprazole 07/01/2018   History of GI bleed 07/01/2018   Insomnia 07/01/2018   Abnormal CT of the chest 03/03/2018    Sensorineural hearing loss (SNHL), bilateral 12/28/2017   Tinnitus, bilateral 12/28/2017   Pulmonary nodule, left 12/09/2016   Former smoker 01/16/2015   Atrial fibrillation ([place]), on Cardizem daily, with prn ASA and Diltiazem 04/25/2012   Rheumatoid arthritis ([place]), on Enbrel, followed by Dr. Amil Amen 01/06/2011   Allergic rhinitis 04/22/2009   Dyslipidemia 10/24/2008   Diverticulosis of large intestine 06/21/2007   Past Surgical History:  Procedure Laterality Date   COLONOSCOPY  01/24/2015   x 3   ERCP N/A 02/18/2015   Procedure: ENDOSCOPIC RETROGRADE CHOLANGIOPANCREATOGRAPHY (ERCP);  Surgeon: Carol Ada, MD;  Location: [facility];  Service: Endoscopy;  Laterality: N/A;   GIVENS CAPSULE STUDY N/A 01/24/2015   Procedure: GIVENS CAPSULE STUDY;  Surgeon: Juanita Craver, MD;  Location: [facility];  Service: Endoscopy;  Laterality: N/A;   INGUINAL HERNIA REPAIR Right 07/27/2019   Procedure: LAPAROSCOPIC RIGHT INGUINAL HERNIA REPAIR WITH MESH;  Surgeon: Ralene Ok, MD;  Location: [place];  Service: General;  Laterality: Right;   LAPAROSCOPIC CHOLECYSTECTOMY  2011   TOE AMPUTATION  1966   TONSILLECTOMY AND ADENOIDECTOMY      Family History  Problem Relation Age of Onset   Prostate cancer Father    Heart disease Mother    Rheum arthritis Mother     Medications- reviewed and updated Current Outpatient Medications  Medication Sig Dispense Refill   acetaminophen (TYLENOL) 160 MG/5ML liquid Take 325 mg by mouth every 4 (four) hours as needed for pain.     aspirin EC 81 MG tablet Take 81 mg by mouth daily as needed (afib).      clotrimazole (LOTRIMIN) 1 % cream Apply 1 application topically daily as needed (jock itch).     diltiazem (CARDIZEM CD) 120 MG 24 hr capsule Take 1 capsule (120 mg total) by mouth daily. 90 capsule 3   docusate sodium (COLACE) 100 MG capsule Take 100 mg by mouth as needed.      etanercept (ENBREL) 50 MG/ML injection Inject 50 mg into the skin every Monday.       ibuprofen (ADVIL,MOTRIN) 200 MG tablet Take 600 mg by mouth every 8 (eight) hours as needed for moderate pain.      LORazepam (ATIVAN) 1 MG tablet TAKE 1 TABLET BY MOUTH AT BEDTIME 30 tablet 0   melatonin 5 MG TABS Take 5 mg by mouth.     Multiple Vitamin (MULTIVITAMIN) tablet Take 1 tablet by mouth daily.     neomycin-bacitracin-polymyxin (NEOSPORIN) ointment Apply 1 application topically as needed for wound care.     omeprazole (PRILOSEC) 40 MG capsule Take 40 mg by mouth every evening.      Probiotic Product (PROBIOTIC-10 ULTIMATE) CAPS Take by mouth.     vitamin C (ASCORBIC ACID) 500 MG tablet Take 500 mg by mouth daily.      No current facility-administered medications for this visit.    Allergies-reviewed and updated Allergies  Allergen Reactions   Caffeine Other (See Comments)    Causes him to go into afib   Flecainide Other (See Comments)    Caused heart to race too fast and had him in the ED   Methotrexate Derivatives Nausea And Vomiting    Increased liver enzymes   Pseudoephedrine-Ibuprofen Other (See Comments)    "  afib"    Penicillins Rash    Did it involve swelling of the face/tongue/throat, SOB, or low BP? No Did it involve sudden or severe rash/hives, skin peeling, or any reaction on the inside of your mouth or nose? No Did you need to seek medical attention at a hospital or doctor's office? No When did it last happen?      40 + years If all above answers are "NO", may proceed with cephalosporin use.     Social History   Socioeconomic History   Marital status: Married    Spouse name: Jeani Hawking   Number of children: 2   Years of education: Not on file   Highest education level: Not on file  Occupational History   Occupation: [employer]    Comment: RETIRED  Tobacco Use   Smoking status: Former    Packs/day: 0.50    Years: 45.00    Pack years: 22.50    Types: Cigarettes    Quit date: 11/13/2009    Years since quitting: 11.4   Smokeless tobacco: Never  Vaping Use    Vaping Use: Never used  Substance and Sexual Activity   Alcohol use: No    Alcohol/week: 0.0 standard drinks   Drug use: No   Sexual activity: Never  Other Topics Concern   Not on file  Social History Narrative   One son with HBP. One son with IDDM. Exercises regularly at the [facility]. Eats well. Down 30 pounds with the lifestyle change. Exercise keeps him from feeling so stiff. Weighs daily. Baseline 199-204.    Social Determinants of Radio broadcast assistant Strain: Not on file  Food Insecurity: No Food Insecurity   Worried About Charity fundraiser in the Last Year: Never true   Ran Out of Food in the Last Year: Never true  Transportation Needs: Not on file  Physical Activity: Not on file  Stress: Not on file  Social Connections: Not on file        Objective:  Physical Exam: BP 131/82   Pulse 62   Temp 97.7 F (36.5 C) (Temporal)   Ht 5' 11.5" (1.816 m)   Wt 201 lb 6.4 oz (91.4 kg)   SpO2 97%   BMI 27.70 kg/m   Body mass index is 27.7 kg/m. Wt Readings from Last 3 Encounters:  04/06/21 201 lb 6.4 oz (91.4 kg)  03/19/21 203 lb 3.2 oz (92.2 kg)  03/17/21 200 lb (90.7 kg)   Gen: NAD, resting comfortably HEENT: TMs normal bilaterally. OP clear. No thyromegaly noted.  CV: RRR with no murmurs appreciated Pulm: NWOB, CTAB with no crackles, wheezes, or rhonchi GI: Normal bowel sounds present. Soft, Nontender, Nondistended. MSK: no edema, cyanosis, or clubbing noted Skin: warm, dry Neuro: CN2-12 grossly intact. Strength 5/5 in upper and lower extremities. Reflexes symmetric and intact bilaterally.  Psych: Normal affect and thought content     I,Jordan Kelly,acting as a scribe for Dimas Chyle, MD.,have documented all relevant documentation on the behalf of Dimas Chyle, MD,as directed by  Dimas Chyle, MD while in the presence of Dimas Chyle, MD.  I, Dimas Chyle, MD, have reviewed all documentation for this visit. The documentation on 04/06/21 for the exam,  diagnosis, procedures, and orders are all accurate and complete.  Time Spent: 45 minutes of total time was spent on the date of the encounter performing the following actions: chart review prior to seeing the patient including recent visits with specialists, obtaining history, performing a  medically necessary exam, counseling on the treatment plan, placing orders, and documenting in our EHR.    Algis Greenhouse. [name], MD 04/06/2021 9:09 AM

## 2021-04-06 NOTE — Assessment & Plan Note (Signed)
Check labs 

## 2021-04-06 NOTE — Assessment & Plan Note (Signed)
Stable on embrel per rheumatology.

## 2021-04-06 NOTE — Assessment & Plan Note (Signed)
Follows with cardiology. On aspirin and cardizem.

## 2021-04-06 NOTE — Assessment & Plan Note (Signed)
Check PSA. ?

## 2021-04-06 NOTE — Patient Instructions (Signed)
It was very nice to see you today!  We will check blood work today.  Keep up the good work with diet and exercise.   I will see you back in a year. Please come back to see me sooner if needed.   Take care, Dr [name]  PLEASE NOTE:  If you had any lab tests please let us know if you have not heard back within a few days. You may see your results on mychart before we have a chance to review them but we will give you a call once they are reviewed by [place]. If we ordered any referrals today, please let us know if you have not heard from their office within the next week.   Please try these tips to maintain a healthy lifestyle:  Eat at least 3 REAL meals and 1-2 snacks per day.  Aim for no more than 5 hours between eating.  If you eat breakfast, please do so within one hour of getting up.   Each meal should contain half fruits/vegetables, one quarter protein, and one quarter carbs (no bigger than a computer mouse)  Cut down on sweet beverages. This includes juice, soda, and sweet tea.   Drink at least 1 glass of water with each meal and aim for at least 8 glasses per day  Exercise at least 150 minutes every week.    Preventive Care 49 Years and Older, Male Preventive care refers to lifestyle choices and visits with your health care provider that can promote health and wellness. Preventive care visits are also called wellness exams. What can I expect for my preventive care visit? Counseling During your preventive care visit, your health care provider may ask about your: Medical history, including: Past medical problems. Family medical history. History of falls. Current health, including: Emotional well-being. Home life and relationship well-being. Sexual activity. Memory and ability to understand (cognition). Lifestyle, including: Alcohol, nicotine or tobacco, and drug use. Access to firearms. Diet, exercise, and sleep habits. Work and work Statistician. Sunscreen use. Safety  issues such as seatbelt and bike helmet use. Physical exam Your health care provider will check your: Height and weight. These may be used to calculate your BMI (body mass index). BMI is a measurement that tells if you are at a healthy weight. Waist circumference. This measures the distance around your waistline. This measurement also tells if you are at a healthy weight and may help predict your risk of certain diseases, such as type 2 diabetes and high blood pressure. Heart rate and blood pressure. Body temperature. Skin for abnormal spots. What immunizations do I need? Vaccines are usually given at various ages, according to a schedule. Your health care provider will recommend vaccines for you based on your age, medical history, and lifestyle or other factors, such as travel or where you work. What tests do I need? Screening Your health care provider may recommend screening tests for certain conditions. This may include: Lipid and cholesterol levels. Diabetes screening. This is done by checking your blood sugar (glucose) after you have not eaten for a while (fasting). Hepatitis C test. Hepatitis B test. HIV (human immunodeficiency virus) test. STI (sexually transmitted infection) testing, if you are at risk. Lung cancer screening. Colorectal cancer screening. Prostate cancer screening. Abdominal aortic aneurysm (AAA) screening. You may need this if you are a current or former smoker. Talk with your health care provider about your test results, treatment options, and if necessary, the need for more tests. Follow these instructions at  home: Eating and drinking  Eat a diet that includes fresh fruits and vegetables, whole grains, lean protein, and low-fat dairy products. Limit your intake of foods with high amounts of sugar, saturated fats, and salt. Take vitamin and mineral supplements as recommended by your health care provider. Do not drink alcohol if your health care provider tells  you not to drink. If you drink alcohol: Limit how much you have to 0-2 drinks a day. Know how much alcohol is in your drink. In the [place] one drink equals one 12 oz bottle of beer (355 mL), one 5 oz glass of wine (148 mL), or one 1 oz glass of hard liquor (44 mL). Lifestyle Brush your teeth every morning and night with fluoride toothpaste. Floss one time each day. Exercise for at least 30 minutes 5 or more days each week. Do not use any products that contain nicotine or tobacco. These products include cigarettes, chewing tobacco, and vaping devices, such as e-cigarettes. If you need help quitting, ask your health care provider. Do not use drugs. If you are sexually active, practice safe sex. Use a condom or other form of protection to prevent STIs. Take aspirin only as told by your health care provider. Make sure that you understand how much to take and what form to take. Work with your health care provider to find out whether it is safe and beneficial for you to take aspirin daily. Ask your health care provider if you need to take a cholesterol-lowering medicine (statin). Find healthy ways to manage stress, such as: Meditation, yoga, or listening to music. Journaling. Talking to a trusted person. Spending time with friends and family. Safety Always wear your seat belt while driving or riding in a vehicle. Do not drive: If you have been drinking alcohol. Do not ride with someone who has been drinking. When you are tired or distracted. While texting. If you have been using any mind-altering substances or drugs. Wear a helmet and other protective equipment during sports activities. If you have firearms in your house, make sure you follow all gun safety procedures. Minimize exposure to UV radiation to reduce your risk of skin cancer. What's next? Visit your health care provider once a year for an annual wellness visit. Ask your health care provider how often you should have your eyes and  teeth checked. Stay up to date on all vaccines. This information is not intended to replace advice given to you by your health care provider. Make sure you discuss any questions you have with your health care provider. Document Revised: 11/05/2020 Document Reviewed: 11/05/2020 Elsevier Patient Education  [place].

## 2021-04-06 NOTE — Assessment & Plan Note (Signed)
Check A1c. 

## 2021-04-06 NOTE — Assessment & Plan Note (Signed)
Stable. Weaning off ativan over the last several months. Still uses very occasionally. Uses melatonin which works well.

## 2021-04-06 NOTE — Assessment & Plan Note (Signed)
Stable on prilosec per GI.

## 2021-04-08 NOTE — Progress Notes (Signed)
Please inform patient of the following:  Labs are all NORMAL. Would like for him to keep up the good work and we can recheck in a year.  William West. [name], MD 04/08/2021 7:41 AM

## 2021-04-23 ENCOUNTER — Telehealth: Payer: Self-pay

## 2021-04-23 NOTE — Telephone Encounter (Signed)
Patient called in and wanted to see who Dr. [name] would suggest for him to see a [facility]. Patient was getting seen by Dr. Collene Mares at [facility] and but patient was told he had to place his credit card on file and doesn't feel comfortable doing that and would like to get seen at [facility] now. Please advise.

## 2021-04-24 ENCOUNTER — Ambulatory Visit (INDEPENDENT_AMBULATORY_CARE_PROVIDER_SITE_OTHER): Payer: Medicare Other

## 2021-04-24 ENCOUNTER — Other Ambulatory Visit: Payer: Self-pay

## 2021-04-24 DIAGNOSIS — Z0000 Encounter for general adult medical examination without abnormal findings: Secondary | ICD-10-CM

## 2021-04-24 NOTE — Patient Instructions (Signed)
William West , Thank you for taking time to come for your Medicare Wellness Visit. I appreciate your ongoing commitment to your health goals. Please review the following plan we discussed and let me know if I can assist you in the future.   Screening recommendations/referrals: Colonoscopy: Done 01/24/15 repeat every 7 years  Recommended yearly ophthalmology/optometry visit for glaucoma screening and checkup Recommended yearly dental visit for hygiene and checkup  Vaccinations: Influenza vaccine: Done 12/09/20 repeat every year Pneumococcal vaccine: Up to date Tdap vaccine: Done 03/30/18 repeat every 10 years  Shingles vaccine: 03/13/20   Covid-19: Completed 2/4, 2/25, 01/07/20 & 03/09/21  Advanced directives: Please bring a copy of your health care power of attorney and living will to the office at your convenience.  Conditions/risks identified: Stay active  Next appointment: Follow up in one year for your annual wellness visit.   Preventive Care 69 Years and Older, Male Preventive care refers to lifestyle choices and visits with your health care provider that can promote health and wellness. What does preventive care include? A yearly physical exam. This is also called an annual well check. Dental exams once or twice a year. Routine eye exams. Ask your health care provider how often you should have your eyes checked. Personal lifestyle choices, including: Daily care of your teeth and gums. Regular physical activity. Eating a healthy diet. Avoiding tobacco and drug use. Limiting alcohol use. Practicing safe sex. Taking low doses of aspirin every day. Taking vitamin and mineral supplements as recommended by your health care provider. What happens during an annual well check? The services and screenings done by your health care provider during your annual well check will depend on your age, overall health, lifestyle risk factors, and family history of disease. Counseling  Your health  care provider may ask you questions about your: Alcohol use. Tobacco use. Drug use. Emotional well-being. Home and relationship well-being. Sexual activity. Eating habits. History of falls. Memory and ability to understand (cognition). Work and work Statistician. Screening  You may have the following tests or measurements: Height, weight, and BMI. Blood pressure. Lipid and cholesterol levels. These may be checked every 5 years, or more frequently if you are over 75 years old. Skin check. Lung cancer screening. You may have this screening every year starting at age 69 if you have a 30-pack-year history of smoking and currently smoke or have quit within the past 15 years. Fecal occult blood test (FOBT) of the stool. You may have this test every year starting at age 69. Flexible sigmoidoscopy or colonoscopy. You may have a sigmoidoscopy every 5 years or a colonoscopy every 10 years starting at age 69. Prostate cancer screening. Recommendations will vary depending on your family history and other risks. Hepatitis C blood test. Hepatitis B blood test. Sexually transmitted disease (STD) testing. Diabetes screening. This is done by checking your blood sugar (glucose) after you have not eaten for a while (fasting). You may have this done every 1-3 years. Abdominal aortic aneurysm (AAA) screening. You may need this if you are a current or former smoker. Osteoporosis. You may be screened starting at age 69 if you are at high risk. Talk with your health care provider about your test results, treatment options, and if necessary, the need for more tests. Vaccines  Your health care provider may recommend certain vaccines, such as: Influenza vaccine. This is recommended every year. Tetanus, diphtheria, and acellular pertussis (Tdap, Td) vaccine. You may need a Td booster every 10 years. Zoster  vaccine. You may need this after age 69. Pneumococcal 13-valent conjugate (PCV13) vaccine. One dose is  recommended after age 69. Pneumococcal polysaccharide (PPSV23) vaccine. One dose is recommended after age 26. Talk to your health care provider about which screenings and vaccines you need and how often you need them. This information is not intended to replace advice given to you by your health care provider. Make sure you discuss any questions you have with your health care provider. Document Released: 06/06/2015 Document Revised: 01/28/2016 Document Reviewed: 03/11/2015 Elsevier Interactive Patient Education  2017 [place] Prevention in the Home Falls can cause injuries. They can happen to people of all ages. There are many things you can do to make your home safe and to help prevent falls. What can I do on the outside of my home? Regularly fix the edges of walkways and driveways and fix any cracks. Remove anything that might make you trip as you walk through a door, such as a raised step or threshold. Trim any bushes or trees on the path to your home. Use bright outdoor lighting. Clear any walking paths of anything that might make someone trip, such as rocks or tools. Regularly check to see if handrails are loose or broken. Make sure that both sides of any steps have handrails. Any raised decks and porches should have guardrails on the edges. Have any leaves, snow, or ice cleared regularly. Use sand or salt on walking paths during winter. Clean up any spills in your garage right away. This includes oil or grease spills. What can I do in the bathroom? Use night lights. Install grab bars by the toilet and in the tub and shower. Do not use towel bars as grab bars. Use non-skid mats or decals in the tub or shower. If you need to sit down in the shower, use a plastic, non-slip stool. Keep the floor dry. Clean up any water that spills on the floor as soon as it happens. Remove soap buildup in the tub or shower regularly. Attach bath mats securely with double-sided non-slip rug  tape. Do not have throw rugs and other things on the floor that can make you trip. What can I do in the bedroom? Use night lights. Make sure that you have a light by your bed that is easy to reach. Do not use any sheets or blankets that are too big for your bed. They should not hang down onto the floor. Have a firm chair that has side arms. You can use this for support while you get dressed. Do not have throw rugs and other things on the floor that can make you trip. What can I do in the kitchen? Clean up any spills right away. Avoid walking on wet floors. Keep items that you use a lot in easy-to-reach places. If you need to reach something above you, use a strong step stool that has a grab bar. Keep electrical cords out of the way. Do not use floor polish or wax that makes floors slippery. If you must use wax, use non-skid floor wax. Do not have throw rugs and other things on the floor that can make you trip. What can I do with my stairs? Do not leave any items on the stairs. Make sure that there are handrails on both sides of the stairs and use them. Fix handrails that are broken or loose. Make sure that handrails are as long as the stairways. Check any carpeting to make sure that it  is firmly attached to the stairs. Fix any carpet that is loose or worn. Avoid having throw rugs at the top or bottom of the stairs. If you do have throw rugs, attach them to the floor with carpet tape. Make sure that you have a light switch at the top of the stairs and the bottom of the stairs. If you do not have them, ask someone to add them for you. What else can I do to help prevent falls? Wear shoes that: Do not have high heels. Have rubber bottoms. Are comfortable and fit you well. Are closed at the toe. Do not wear sandals. If you use a stepladder: Make sure that it is fully opened. Do not climb a closed stepladder. Make sure that both sides of the stepladder are locked into place. Ask someone to  hold it for you, if possible. Clearly mark and make sure that you can see: Any grab bars or handrails. First and last steps. Where the edge of each step is. Use tools that help you move around (mobility aids) if they are needed. These include: Canes. Walkers. Scooters. Crutches. Turn on the lights when you go into a dark area. Replace any light bulbs as soon as they burn out. Set up your furniture so you have a clear path. Avoid moving your furniture around. If any of your floors are uneven, fix them. If there are any pets around you, be aware of where they are. Review your medicines with your doctor. Some medicines can make you feel dizzy. This can increase your chance of falling. Ask your doctor what other things that you can do to help prevent falls. This information is not intended to replace advice given to you by your health care provider. Make sure you discuss any questions you have with your health care provider. Document Released: 03/06/2009 Document Revised: 10/16/2015 Document Reviewed: 06/14/2014 Elsevier Interactive Patient Education  2017 [employer].

## 2021-04-24 NOTE — Progress Notes (Signed)
Virtual Visit via Telephone Note  I connected with  William West on 04/24/21 at  3:15 PM EST by telephone and verified that I am speaking with the correct person using two identifiers.  Medicare Annual Wellness visit completed telephonically due to Covid-19 pandemic.   Persons participating in this call: This Health Coach and this patient.   Location: Patient: Home Provider: Office   I discussed the limitations, risks, security and privacy concerns of performing an evaluation and management service by telephone and the availability of in person appointments. The patient expressed understanding and agreed to proceed.  Unable to perform video visit due to video visit attempted and failed and/or patient does not have video capability.   Some vital signs may be absent or patient reported.   Willette Brace, LPN   Subjective:   William West is a 70 y.o. male who presents for Medicare Annual/Subsequent preventive examination.  Review of Systems     Cardiac Risk Factors include: advanced age (>72men, >104 women);male gender;dyslipidemia     Objective:    There were no vitals filed for this visit. There is no height or weight on file to calculate BMI.  Advanced Directives 04/24/2021 11/22/2020 09/28/2019 09/20/2019 07/27/2019 07/24/2019 03/30/2018  Does Patient Have a Medical Advance Directive? Yes No No Yes Yes Yes Yes  Type of [employer] Power of [place];Living will Living will Living will [place];Living will  Does patient want to make changes to medical advance directive? - - - - No - Patient declined No - Guardian declined -  Copy of [place] in Chart? No - copy requested - No - copy requested - - - -  Would patient like information on creating a medical advance directive? - No - Patient declined No - Patient declined - - - No - Patient declined    Current  Medications (verified) Outpatient Encounter Medications as of 04/24/2021  Medication Sig   acetaminophen (TYLENOL) 160 MG/5ML liquid Take 325 mg by mouth every 4 (four) hours as needed for pain.   aspirin EC 81 MG tablet Take 81 mg by mouth daily as needed (afib).    clotrimazole (LOTRIMIN) 1 % cream Apply 1 application topically daily as needed (jock itch).   diltiazem (CARDIZEM CD) 120 MG 24 hr capsule Take 1 capsule (120 mg total) by mouth daily.   docusate sodium (COLACE) 100 MG capsule Take 100 mg by mouth as needed.    etanercept (ENBREL) 50 MG/ML injection Inject 50 mg into the skin every Monday.    ibuprofen (ADVIL,MOTRIN) 200 MG tablet Take 600 mg by mouth every 8 (eight) hours as needed for moderate pain.    LORazepam (ATIVAN) 1 MG tablet TAKE 1 TABLET BY MOUTH AT BEDTIME   melatonin 5 MG TABS Take 5 mg by mouth.   Multiple Vitamin (MULTIVITAMIN) tablet Take 1 tablet by mouth daily.   neomycin-bacitracin-polymyxin (NEOSPORIN) ointment Apply 1 application topically as needed for wound care.   omeprazole (PRILOSEC) 40 MG capsule Take 40 mg by mouth every evening.    vitamin C (ASCORBIC ACID) 500 MG tablet Take 500 mg by mouth daily.    Probiotic Product (PROBIOTIC-10 ULTIMATE) CAPS Take by mouth. (Patient not taking: Reported on 04/24/2021)   No facility-administered encounter medications on file as of 04/24/2021.    Allergies (verified) Caffeine, Flecainide, Methotrexate derivatives, Pseudoephedrine-ibuprofen, and Penicillins   History: Past Medical History:  Diagnosis  Date   A-fib [facility])    Acute blood loss anemia 01/26/2015   Allergic rhinitis    Diverticulosis of colon    Dysplastic nevus    GERD (gastroesophageal reflux disease)    GI bleeding 01/24/2015   Hypercholesterolemia    Postural dizziness with near syncope due to dehydration 12/09/2016   RA (rheumatoid arthritis) [facility])    Past Surgical History:  Procedure Laterality Date   COLONOSCOPY  01/24/2015   x 3   ERCP  N/A 02/18/2015   Procedure: ENDOSCOPIC RETROGRADE CHOLANGIOPANCREATOGRAPHY (ERCP);  Surgeon: Carol Ada, MD;  Location: [facility];  Service: Endoscopy;  Laterality: N/A;   GIVENS CAPSULE STUDY N/A 01/24/2015   Procedure: GIVENS CAPSULE STUDY;  Surgeon: Juanita Craver, MD;  Location: [place];  Service: Endoscopy;  Laterality: N/A;   INGUINAL HERNIA REPAIR Right 07/27/2019   Procedure: LAPAROSCOPIC RIGHT INGUINAL HERNIA REPAIR WITH MESH;  Surgeon: Ralene Ok, MD;  Location: [place];  Service: General;  Laterality: Right;   LAPAROSCOPIC CHOLECYSTECTOMY  2011   TOE AMPUTATION  1966   TONSILLECTOMY AND ADENOIDECTOMY     Family History  Problem Relation Age of Onset   Prostate cancer Father    Heart disease Mother    Rheum arthritis Mother    Social History   Socioeconomic History   Marital status: Married    Spouse name: Jeani Hawking   Number of children: 2   Years of education: Not on file   Highest education level: Not on file  Occupational History   Occupation: [employer]    Comment: RETIRED  Tobacco Use   Smoking status: Former    Packs/day: 0.50    Years: 45.00    Pack years: 22.50    Types: Cigarettes    Quit date: 11/13/2009    Years since quitting: 11.4   Smokeless tobacco: Never  Vaping Use   Vaping Use: Never used  Substance and Sexual Activity   Alcohol use: No    Alcohol/week: 0.0 standard drinks   Drug use: No   Sexual activity: Never  Other Topics Concern   Not on file  Social History Narrative   One son with HBP. One son with IDDM. Exercises regularly at the [name]. Eats well. Down 30 pounds with the lifestyle change. Exercise keeps him from feeling so stiff. Weighs daily. Baseline 199-204.    Social Determinants of Health   Financial Resource Strain: Low Risk    Difficulty of Paying Living Expenses: Not hard at all  Food Insecurity: No Food Insecurity   Worried About Charity fundraiser in the Last Year: Never true   [place] in the Last Year: Never true   Transportation Needs: No Transportation Needs   Lack of Transportation (Medical): No   Lack of Transportation (Non-Medical): No  Physical Activity: Sufficiently Active   Days of Exercise per Week: 5 days   Minutes of Exercise per Session: 60 min  Stress: No Stress Concern Present   Feeling of Stress : Not at all  Social Connections: Socially Integrated   Frequency of Communication with Friends and Family: More than three times a week   Frequency of Social Gatherings with Friends and Family: More than three times a week   Attends Religious Services: More than 4 times per year   Active Member of [employer] or Organizations: Yes   Attends Archivist Meetings: 1 to 4 times per year   Marital Status: Married    Tobacco Counseling Counseling given:  Not Answered   Clinical Intake:  Pre-visit preparation completed: Yes  Pain : No/denies pain     BMI - recorded: 27.7 Nutritional Status: BMI 25 -29 Overweight Nutritional Risks: None Diabetes: No  How often do you need to have someone help you when you read instructions, pamphlets, or other written materials from your doctor or pharmacy?: 1 - Never  Diabetic?No  Interpreter Needed?: No  Information entered by :: Dayton Martes   Activities of Daily Living In your present state of health, do you have any difficulty performing the following activities: 04/24/2021  Hearing? Y  Vision? N  Difficulty concentrating or making decisions? N  Walking or climbing stairs? N  Dressing or bathing? N  Doing errands, shopping? N  Preparing Food and eating ? N  Using the Toilet? N  In the past six months, have you accidently leaked urine? N  Do you have problems with loss of bowel control? N  Managing your Medications? N  Managing your Finances? N  Housekeeping or managing your Housekeeping? N  Some recent data might be hidden    Patient Care Team: [name], MD as PCP - General (Family Medicine) Hennie Duos,  MD as Consulting Physician (Rheumatology) Juanita Craver, MD as Consulting Physician (Gastroenterology) Madelin Rear, [facility] as Pharmacist (Pharmacist)  Indicate any recent Medical Services you may have received from other than [facility] providers in the past year (date may be approximate).     Assessment:   This is a routine wellness examination for [place].  Hearing/Vision screen Hearing Screening - Comments:: Pt stated moderate heaRING LOSS IN RIGHT and sever in the left   Dietary issues and exercise activities discussed: Current Exercise Habits: Home exercise routine, Type of exercise: Other - see comments, Time (Minutes): > 60, Frequency (Times/Week): 5, Weekly Exercise (Minutes/Week): 0   Goals Addressed             This Visit's Progress    Patient Stated       Continue to stay healthy and stay active        Depression Screen PHQ 2/9 Scores 04/24/2021 04/06/2021 04/03/2020 04/03/2019 06/26/2018 01/21/2016 12/24/2015  PHQ - 2 Score 0 0 0 0 0 0 0  PHQ- 9 Score - - - 1 - - -    Fall Risk Fall Risk  04/24/2021 04/06/2021 04/18/2019 04/03/2019 11/30/2018  Falls in the past year? 0 0 0 0 0  Comment - - Emmi Telephone Survey: data to providers prior to load - -  Number falls in past yr: 0 0 - - 0  Injury with Fall? 0 0 - - 0  Risk for fall due to : Impaired vision No Fall Risks - - -  Follow up Falls prevention discussed - - - -    FALL RISK PREVENTION PERTAINING TO THE HOME:  Any stairs in or around the home? Yes  If so, are there any without handrails? No  Home free of loose throw rugs in walkways, pet beds, electrical cords, etc? Yes  Adequate lighting in your home to reduce risk of falls? Yes   ASSISTIVE DEVICES UTILIZED TO PREVENT FALLS:  Life alert? No  Use of a cane, walker or w/c? No  Grab bars in the bathroom? No  Shower chair or bench in shower? No  Elevated toilet seat or a handicapped toilet? No   TIMED UP AND GO:  Was the test performed? No .  Cognitive  Function:     6CIT Screen 04/24/2021  What Year? 0 points  What month? 0 points  What time? 0 points  Count back from 20 0 points  Months in reverse 0 points  Repeat phrase 0 points  Total Score 0    Immunizations Immunization History  Administered Date(s) Administered   Fluad Quad(high Dose 65+) 02/11/2020, 02/09/2021   Influenza Split 02/22/2011, 03/14/2012, 03/16/2013, 02/08/2014, 02/22/2015   Influenza, High Dose Seasonal PF 02/21/2017, 03/02/2018, 01/17/2019   Influenza,inj,Quad PF,6+ Mos 01/21/2016   Influenza,inj,Quad PF,6-35 Mos 12/09/2020   PFIZER(Purple Top)SARS-COV-2 Vaccination 06/28/2019, 07/19/2019, 01/07/2020   Pfizer Covid-19 Vaccine Bivalent Booster 8yrs & up 03/09/2021   Pneumococcal Conjugate-13 08/22/2017   Pneumococcal Polysaccharide-23 10/24/2008, 01/26/2019   Td 09/21/2001, 10/24/2008   Tdap 03/07/2014, 03/30/2018   Zoster Recombinat (Shingrix) 03/13/2020    TDAP status: Up to date  Flu Vaccine status: Up to date  Pneumococcal vaccine status: Up to date  Covid-19 vaccine status: Completed vaccines  Qualifies for Shingles Vaccine? Yes   Zostavax completed Yes   Shingrix Completed?: Yes  Screening Tests Health Maintenance  Topic Date Due   Zoster Vaccines- Shingrix (2 of 2) 05/08/2020   COLONOSCOPY (Pts 45-31yrs Insurance coverage will need to be confirmed)  01/23/2022   TETANUS/TDAP  03/30/2028   Pneumonia Vaccine 2+ Years old  Completed   INFLUENZA VACCINE  Completed   COVID-19 Vaccine  Completed   Hepatitis C Screening  Completed   HPV VACCINES  Aged Out    Health Maintenance  Health Maintenance Due  Topic Date Due   Zoster Vaccines- Shingrix (2 of 2) 05/08/2020    Colorectal cancer screening: Type of screening: Colonoscopy. Completed 01/24/15. Repeat every 7 years   Additional Screening:  Hepatitis C Screening: Completed 04/03/19  Vision Screening: Recommended annual ophthalmology exams for early detection of glaucoma and  other disorders of the eye. Is the patient up to date with their annual eye exam?  Yes  Who is the provider or what is the name of the office in which the patient attends annual eye exams? Follows up with provider  If pt is not established with a provider, would they like to be referred to a provider to establish care? No .   Dental Screening: Recommended annual dental exams for proper oral hygiene  Community Resource Referral / Chronic Care Management: CRR required this visit?  No   CCM required this visit?  No      Plan:     I have personally reviewed and noted the following in the patient's chart:   Medical and social history Use of alcohol, tobacco or illicit drugs  Current medications and supplements including opioid prescriptions. Patient is not currently taking opioid prescriptions. Functional ability and status Nutritional status Physical activity Advanced directives List of other physicians Hospitalizations, surgeries, and ER visits in previous 12 months Vitals Screenings to include cognitive, depression, and falls Referrals and appointments  In addition, I have reviewed and discussed with patient certain preventive protocols, quality metrics, and best practice recommendations. A written personalized care plan for preventive services as well as general preventive health recommendations were provided to patient.     Willette Brace, LPN   [id number]   Nurse Notes: None

## 2021-04-28 ENCOUNTER — Other Ambulatory Visit: Payer: Self-pay | Admitting: *Deleted

## 2021-04-28 DIAGNOSIS — K219 Gastro-esophageal reflux disease without esophagitis: Secondary | ICD-10-CM

## 2021-04-28 NOTE — Telephone Encounter (Signed)
Ok with me. Please place any necessary orders. 

## 2021-04-28 NOTE — Telephone Encounter (Signed)
Please advise 

## 2021-04-28 NOTE — Telephone Encounter (Signed)
Referral for [facility] placed

## 2021-05-06 ENCOUNTER — Encounter: Payer: Self-pay | Admitting: Internal Medicine

## 2021-05-06 ENCOUNTER — Telehealth: Payer: Self-pay | Admitting: Gastroenterology

## 2021-05-06 NOTE — Telephone Encounter (Signed)
Hi Dr. Lorenso Courier and Dr. Candis Schatz,  Please see Dr. Lynne Leader note below.  Would either of you approve the transfer of care of this patient?  Records are in Epic for review.  Thank you.

## 2021-05-06 NOTE — Telephone Encounter (Signed)
Request received to transfer GI care from outside practice to [place].  I am unable to accommodate a transfer of care. Perhaps Dr. Lorenso Courier or Dr. Candis Schatz have availability.

## 2021-05-06 NOTE — Telephone Encounter (Signed)
Good morning Dr. Fuller Plan, (DoD for 05/06/21, a.m.)  This patient is requesting a transfer of care to our practice.  He has previously seen Dr. Collene Mares (records in epic for ERCP and colonoscopies last done in September of 2016); however, he has had a "falling out" with that practice as they have a new policy requiring they put a credit card on file.  The patient did not agree with this policy and they will not see him unless he gives them his credit card.  The patient is having issues with GERD and is probably due for another colonoscopy.  Please let me know if you approve the transfer.  Thank you so much for your time.

## 2021-06-05 ENCOUNTER — Encounter: Payer: Self-pay | Admitting: Internal Medicine

## 2021-06-05 ENCOUNTER — Ambulatory Visit (INDEPENDENT_AMBULATORY_CARE_PROVIDER_SITE_OTHER): Payer: Medicare Other | Admitting: Internal Medicine

## 2021-06-05 VITALS — BP 110/70 | HR 66 | Ht 71.5 in | Wt 204.0 lb

## 2021-06-05 DIAGNOSIS — K317 Polyp of stomach and duodenum: Secondary | ICD-10-CM | POA: Diagnosis not present

## 2021-06-05 DIAGNOSIS — K219 Gastro-esophageal reflux disease without esophagitis: Secondary | ICD-10-CM | POA: Diagnosis not present

## 2021-06-05 DIAGNOSIS — Z1211 Encounter for screening for malignant neoplasm of colon: Secondary | ICD-10-CM | POA: Diagnosis not present

## 2021-06-05 DIAGNOSIS — R140 Abdominal distension (gaseous): Secondary | ICD-10-CM | POA: Diagnosis not present

## 2021-06-05 NOTE — Progress Notes (Signed)
Chief Complaint: GERD, colonoscopy  HPI : 71 year old with history of RA, A-fib, and GERD presents with GERD and for colonoscopy  He has had longstanding gas and bloating ever since he was in his 20-30s. Over the last 5 years he has lost about 35 lbs deliberately by being more active and eating better. Typically he eats a fairly high fiber diet. He cannot think of any particular foods that cause his bloating to become worse. He did cut out some dairy which has helped somewhat bloating. He has tried to take Beano, but has not found any benefit from that. He will take gas relief medications from [facility] or [place], which seems to help some. He will also occassionally take probiotics, which seem to help. He has on average 1-3 BMs per day, and he feels like he clears himself out pretty well. Denies abdominal pain, N&V, dysphagia, diarrhea, and constipation. Denies blood in stools currently. Has fam hx of colon polyps. Denies use of blood thinners. His last colonoscopy was in 2016 that was unremarkable  Wt Readings from Last 3 Encounters:  06/05/21 204 lb (92.5 kg)  04/06/21 201 lb 6.4 oz (91.4 kg)  03/19/21 203 lb 3.2 oz (92.2 kg)   Past Medical History:  Diagnosis Date   A-fib ([place])    Acute blood loss anemia 01/26/2015   Allergic rhinitis    Diverticulosis of colon    Dysplastic nevus    GERD (gastroesophageal reflux disease)    GI bleeding 01/24/2015   Hypercholesterolemia    Postural dizziness with near syncope due to dehydration 12/09/2016   RA (rheumatoid arthritis) [facility])     Past Surgical History:  Procedure Laterality Date   COLONOSCOPY  01/24/2015   x 3   ERCP N/A 02/18/2015   Procedure: ENDOSCOPIC RETROGRADE CHOLANGIOPANCREATOGRAPHY (ERCP);  Surgeon: Carol Ada, MD;  Location: [facility];  Service: Endoscopy;  Laterality: N/A;   GIVENS CAPSULE STUDY N/A 01/24/2015   Procedure: GIVENS CAPSULE STUDY;  Surgeon: Juanita Craver, MD;  Location: [place];  Service: Endoscopy;   Laterality: N/A;   INGUINAL HERNIA REPAIR Right 07/27/2019   Procedure: LAPAROSCOPIC RIGHT INGUINAL HERNIA REPAIR WITH MESH;  Surgeon: Ralene Ok, MD;  Location: [place];  Service: General;  Laterality: Right;   LAPAROSCOPIC CHOLECYSTECTOMY  2011   TOE AMPUTATION  1966   TONSILLECTOMY AND ADENOIDECTOMY     Family History  Problem Relation Age of Onset   Prostate cancer Father    Heart disease Mother    Rheum arthritis Mother    Social History   Tobacco Use   Smoking status: Former    Packs/day: 0.50    Years: 45.00    Pack years: 22.50    Types: Cigarettes    Quit date: 11/13/2009    Years since quitting: 11.5   Smokeless tobacco: Never  Vaping Use   Vaping Use: Never used  Substance Use Topics   Alcohol use: No    Alcohol/week: 0.0 standard drinks   Drug use: No   Current Outpatient Medications  Medication Sig Dispense Refill   acetaminophen (TYLENOL) 160 MG/5ML liquid Take 325 mg by mouth every 4 (four) hours as needed for pain.     aspirin EC 81 MG tablet Take 81 mg by mouth daily as needed (afib).      clotrimazole (LOTRIMIN) 1 % cream Apply 1 application topically daily as needed (jock itch).     diltiazem (CARDIZEM CD) 120 MG 24 hr capsule Take 1 capsule (120 mg  total) by mouth daily. 90 capsule 3   docusate sodium (COLACE) 100 MG capsule Take 100 mg by mouth as needed.      etanercept (ENBREL) 50 MG/ML injection Inject 50 mg into the skin every Monday.      ibuprofen (ADVIL,MOTRIN) 200 MG tablet Take 600 mg by mouth every 8 (eight) hours as needed for moderate pain.      LORazepam (ATIVAN) 1 MG tablet TAKE 1 TABLET BY MOUTH AT BEDTIME 30 tablet 0   melatonin 5 MG TABS Take 5 mg by mouth.     Multiple Vitamin (MULTIVITAMIN) tablet Take 1 tablet by mouth daily.     neomycin-bacitracin-polymyxin (NEOSPORIN) ointment Apply 1 application topically as needed for wound care.     omeprazole (PRILOSEC) 40 MG capsule Take 40 mg by mouth every evening.      Probiotic Product  (PROBIOTIC-10 ULTIMATE) CAPS Take by mouth.     simethicone (MYLICON) 732 MG chewable tablet Chew 125 mg by mouth every 6 (six) hours as needed for flatulence.     vitamin C (ASCORBIC ACID) 500 MG tablet Take 500 mg by mouth daily.      No current facility-administered medications for this visit.   Allergies  Allergen Reactions   Caffeine Other (See Comments)    Causes him to go into afib   Flecainide Other (See Comments)    Caused heart to race too fast and had him in the ED   Methotrexate Derivatives Nausea And Vomiting    Increased liver enzymes   Pseudoephedrine-Ibuprofen Other (See Comments)    " afib"    Penicillins Rash    Did it involve swelling of the face/tongue/throat, SOB, or low BP? No Did it involve sudden or severe rash/hives, skin peeling, or any reaction on the inside of your mouth or nose? No Did you need to seek medical attention at a hospital or doctor's office? No When did it last happen?      40 + years If all above answers are NO, may proceed with cephalosporin use.      Review of Systems: All systems reviewed and negative except where noted in HPI.   Physical Exam: BP 110/70    Pulse 66    Ht 5' 11.5" (1.816 m)    Wt 204 lb (92.5 kg)    BMI 28.06 kg/m  Constitutional: Pleasant,well-developed, male in no acute distress. HEENT: Normocephalic and atraumatic. Conjunctivae are normal. No scleral icterus. Cardiovascular: Normal rate, regular rhythm.  Pulmonary/chest: Effort normal and breath sounds normal. No wheezing, rales or rhonchi. Abdominal: Soft, nondistended, nontender. Bowel sounds active throughout. There are no masses palpable. No hepatomegaly. Extremities: No edema Neurological: Alert and oriented to person place and time. Skin: Skin is warm and dry. No rashes noted. Psychiatric: Normal mood and affect. Behavior is normal.  Labs 03/2021: CBC and CMP unremarkable.  CT A/P w/contrast 09/06/17: Abdomen / Pelvis Impression: 1. Pneumobilia  within the LEFT hepatic lobe is presumed related to prior sphincterotomy. 2. Sigmoid diverticulosis without diverticulitis. 3. Moderate size fat filled LEFT inguinal hernia.  Colonoscopy 03/25/10:   EGD 01/24/15:   Colonoscopy 01/24/15:   ERCP 02/18/15: IMPRESSIONS: 1) Choledocholithiasis s/p successful stone extraction.  ASSESSMENT AND PLAN:  Colon cancer screening Abdominal bloating Gastric polyps GERD Patient presents with longstanding issues with bloating and GERD. I gave him some information on the low FODMAP diet that may help with his bloating issues. Patient has been noted to have gastric polyps in the past, though no  biopsies are available for review. Per prior gastroenterologist Dr. Collene Mares, he was due for colonoscopy in 2022 for colon cancer screening. Will plan to proceed with EGD and colonoscopy for further evaluation - Low FODMAP diet handout - EGD/colonoscopy [facility] - Consider hydrogen breath testing in the future  [name], MD

## 2021-06-05 NOTE — Patient Instructions (Addendum)
If you are age 70 or older, your body mass index should be between 23-30. Your Body mass index is 28.06 kg/m. If this is out of the aforementioned range listed, please consider follow up with your Primary Care Provider.  If you are age 46 or younger, your body mass index should be between 19-25. Your Body mass index is 28.06 kg/m. If this is out of the aformentioned range listed, please consider follow up with your Primary Care Provider.   ________________________________________________________  The [place] [facility] providers would like to encourage you to use [facility] to communicate with providers for non-urgent requests or questions.  Due to long hold times on the telephone, sending your provider a message by [facility] may be a faster and more efficient way to get a response.  Please allow 48 business hours for a response.  Please remember that this is for non-urgent requests.  _______________________________________________________  Dennis Bast have been scheduled for an endoscopy and colonoscopy. Please follow the written instructions given to you at your visit today. Please pick up your prep supplies at the pharmacy within the next 1-3 days. If you use inhalers (even only as needed), please bring them with you on the day of your procedure.  You have been given a low FODMAP diet

## 2021-06-11 ENCOUNTER — Encounter: Payer: Self-pay | Admitting: Internal Medicine

## 2021-06-11 ENCOUNTER — Other Ambulatory Visit: Payer: Self-pay

## 2021-06-11 DIAGNOSIS — K219 Gastro-esophageal reflux disease without esophagitis: Secondary | ICD-10-CM

## 2021-06-11 MED ORDER — OMEPRAZOLE 40 MG PO CPDR: 40.0000 mg | DELAYED_RELEASE_CAPSULE | Freq: Every evening | ORAL | 0 refills | Status: DC

## 2021-06-29 DIAGNOSIS — H04123 Dry eye syndrome of bilateral lacrimal glands: Secondary | ICD-10-CM | POA: Diagnosis not present

## 2021-07-22 DIAGNOSIS — Z6827 Body mass index (BMI) 27.0-27.9, adult: Secondary | ICD-10-CM | POA: Diagnosis not present

## 2021-07-22 DIAGNOSIS — Z79899 Other long term (current) drug therapy: Secondary | ICD-10-CM | POA: Diagnosis not present

## 2021-07-22 DIAGNOSIS — M0579 Rheumatoid arthritis with rheumatoid factor of multiple sites without organ or systems involvement: Secondary | ICD-10-CM | POA: Diagnosis not present

## 2021-07-22 DIAGNOSIS — E663 Overweight: Secondary | ICD-10-CM | POA: Diagnosis not present

## 2021-07-23 ENCOUNTER — Ambulatory Visit (AMBULATORY_SURGERY_CENTER): Payer: Medicare Other | Admitting: Internal Medicine

## 2021-07-23 ENCOUNTER — Encounter: Payer: Self-pay | Admitting: Internal Medicine

## 2021-07-23 VITALS — BP 127/65 | HR 63 | Temp 97.5°F | Resp 12 | Ht 71.0 in | Wt 204.0 lb

## 2021-07-23 DIAGNOSIS — K219 Gastro-esophageal reflux disease without esophagitis: Secondary | ICD-10-CM

## 2021-07-23 DIAGNOSIS — K449 Diaphragmatic hernia without obstruction or gangrene: Secondary | ICD-10-CM | POA: Diagnosis not present

## 2021-07-23 DIAGNOSIS — K2970 Gastritis, unspecified, without bleeding: Secondary | ICD-10-CM | POA: Diagnosis not present

## 2021-07-23 DIAGNOSIS — Z1211 Encounter for screening for malignant neoplasm of colon: Secondary | ICD-10-CM | POA: Diagnosis not present

## 2021-07-23 DIAGNOSIS — K317 Polyp of stomach and duodenum: Secondary | ICD-10-CM

## 2021-07-23 DIAGNOSIS — D120 Benign neoplasm of cecum: Secondary | ICD-10-CM

## 2021-07-23 DIAGNOSIS — K3189 Other diseases of stomach and duodenum: Secondary | ICD-10-CM | POA: Diagnosis not present

## 2021-07-23 DIAGNOSIS — D122 Benign neoplasm of ascending colon: Secondary | ICD-10-CM

## 2021-07-23 DIAGNOSIS — K222 Esophageal obstruction: Secondary | ICD-10-CM | POA: Diagnosis not present

## 2021-07-23 MED ORDER — SODIUM CHLORIDE 0.9 % IV SOLN: 500.0000 mL | INTRAVENOUS | Status: DC

## 2021-07-23 NOTE — Progress Notes (Signed)
Called to room to assist during endoscopic procedure.  Patient ID and intended procedure confirmed with present staff. Received instructions for my participation in the procedure from the performing physician.  

## 2021-07-23 NOTE — Patient Instructions (Signed)
Handout on polyps, gastritis, Hiatal hernia  provided  ? ?Await pathology results.  ? ?Continue current medications.  ? ? ?YOU HAD AN ENDOSCOPIC PROCEDURE TODAY AT [facility]:   Refer to the procedure report that was given to you for any specific questions about what was found during the examination.  If the procedure report does not answer your questions, please call your gastroenterologist to clarify.  If you requested that your care partner not be given the details of your procedure findings, then the procedure report has been included in a sealed envelope for you to review at your convenience later. ? ?YOU SHOULD EXPECT: Some feelings of bloating in the abdomen. Passage of more gas than usual.  Walking can help get rid of the air that was put into your GI tract during the procedure and reduce the bloating. If you had a lower endoscopy (such as a colonoscopy or flexible sigmoidoscopy) you may notice spotting of blood in your stool or on the toilet paper. If you underwent a bowel prep for your procedure, you may not have a normal bowel movement for a few days. ? ?Please Note:  You might notice some irritation and congestion in your nose or some drainage.  This is from the oxygen used during your procedure.  There is no need for concern and it should clear up in a day or so. ? ?SYMPTOMS TO REPORT IMMEDIATELY: ? ?Following lower endoscopy (colonoscopy or flexible sigmoidoscopy): ? Excessive amounts of blood in the stool ? Significant tenderness or worsening of abdominal pains ? Swelling of the abdomen that is new, acute ? Fever of 100?F or higher ? ?Following upper endoscopy (EGD) ? Vomiting of blood or coffee ground material ? New chest pain or pain under the shoulder blades ? Painful or persistently difficult swallowing ? New shortness of breath ? Fever of 100?F or higher ? Black, tarry-looking stools ? ?For urgent or emergent issues, a gastroenterologist can be reached at any hour by calling  [phone]. ?Do not use MyChart messaging for urgent concerns.  ? ? ?DIET:  We do recommend a small meal at first, but then you may proceed to your regular diet.  Drink plenty of fluids but you should avoid alcoholic beverages for 24 hours. ? ?ACTIVITY:  You should plan to take it easy for the rest of today and you should NOT DRIVE or use heavy machinery until tomorrow (because of the sedation medicines used during the test).   ? ?FOLLOW UP: ?Our staff will call the number listed on your records 48-72 hours following your procedure to check on you and address any questions or concerns that you may have regarding the information given to you following your procedure. If we do not reach you, we will leave a message.  We will attempt to reach you two times.  During this call, we will ask if you have developed any symptoms of COVID 19. If you develop any symptoms (ie: fever, flu-like symptoms, shortness of breath, cough etc.) before then, please call [phone].  If you test positive for Covid 19 in the 2 weeks post procedure, please call and report this information to [place].   ? ?If any biopsies were taken you will be contacted by phone or by letter within the next 1-3 weeks.  Please call us at [phone] if you have not heard about the biopsies in 3 weeks.  ? ? ?SIGNATURES/CONFIDENTIALITY: ?You and/or your care partner have signed paperwork which will be  entered into your electronic medical record.  These signatures attest to the fact that that the information above on your After Visit Summary has been reviewed and is understood.  Full responsibility of the confidentiality of this discharge information lies with you and/or your care-partner. ? ?

## 2021-07-23 NOTE — Progress Notes (Signed)
GASTROENTEROLOGY PROCEDURE H&P NOTE   Primary Care Physician: [name], MD    Reason for Procedure:   Colon cancer screening, gastric polyps, GERD  Plan:    EGD/colonoscopy  Patient is appropriate for endoscopic procedure(s) in the ambulatory ([place]) setting.  The nature of the procedure, as well as the risks, benefits, and alternatives were carefully and thoroughly reviewed with the patient. Ample time for discussion and questions allowed. The patient understood, was satisfied, and agreed to proceed.     HPI: William West is a 71 y.o. male who presents for EGD/colonoscopy for evaluation of colon cancer screening, gastric polyps, and GERD .  Patient was most recently seen in the Gastroenterology Clinic on 06/05/21.  No interval change in medical history since that appointment. Please refer to that note for full details regarding GI history and clinical presentation.   Past Medical History:  Diagnosis Date   A-fib ([place])    Acute blood loss anemia 01/26/2015   Allergic rhinitis    Diverticulosis of colon    Dysplastic nevus    GERD (gastroesophageal reflux disease)    GI bleeding 01/24/2015   Hypercholesterolemia    Postural dizziness with near syncope due to dehydration 12/09/2016   RA (rheumatoid arthritis) [facility])     Past Surgical History:  Procedure Laterality Date   COLONOSCOPY  01/24/2015   x 3   ERCP N/A 02/18/2015   Procedure: ENDOSCOPIC RETROGRADE CHOLANGIOPANCREATOGRAPHY (ERCP);  Surgeon: Carol Ada, MD;  Location: [facility];  Service: Endoscopy;  Laterality: N/A;   GIVENS CAPSULE STUDY N/A 01/24/2015   Procedure: GIVENS CAPSULE STUDY;  Surgeon: Juanita Craver, MD;  Location: [place];  Service: Endoscopy;  Laterality: N/A;   INGUINAL HERNIA REPAIR Right 07/27/2019   Procedure: LAPAROSCOPIC RIGHT INGUINAL HERNIA REPAIR WITH MESH;  Surgeon: Ralene Ok, MD;  Location: [place];  Service: General;  Laterality: Right;   LAPAROSCOPIC CHOLECYSTECTOMY  2011    TOE [place]      Prior to Admission medications   Medication Sig Start Date End Date Taking? Authorizing Provider  acetaminophen (TYLENOL) 160 MG/5ML liquid Take 325 mg by mouth every 4 (four) hours as needed for pain.    [provider]  aspirin EC 81 MG tablet Take 81 mg by mouth daily as needed (afib).     [provider]  clotrimazole (LOTRIMIN) 1 % cream Apply 1 application topically daily as needed (jock itch).    [provider]  diltiazem (CARDIZEM CD) 120 MG 24 hr capsule Take 1 capsule (120 mg total) by mouth daily. 03/17/21   Evans Lance, MD  docusate sodium (COLACE) 100 MG capsule Take 100 mg by mouth as needed.     [provider]  etanercept (ENBREL) 50 MG/ML injection Inject 50 mg into the skin every Monday.     [provider]  ibuprofen (ADVIL,MOTRIN) 200 MG tablet Take 600 mg by mouth every 8 (eight) hours as needed for moderate pain.     [provider]  LORazepam (ATIVAN) 1 MG tablet TAKE 1 TABLET BY MOUTH AT BEDTIME 11/11/20   [name], MD  melatonin 5 MG TABS Take 5 mg by mouth.    [provider]  Multiple Vitamin (MULTIVITAMIN) tablet Take 1 tablet by mouth daily.    [provider]  neomycin-bacitracin-polymyxin (NEOSPORIN) ointment Apply 1 application topically as needed for wound care.    [provider]  omeprazole (PRILOSEC) 40  MG capsule Take 1 capsule (40 mg total) by mouth every evening. 06/11/21   Sharyn Creamer, MD  Probiotic Product (PROBIOTIC-10 ULTIMATE) CAPS Take by mouth.    [provider]  simethicone (MYLICON) 947 MG chewable tablet Chew 125 mg by mouth every 6 (six) hours as needed for flatulence.    [provider]  vitamin C (ASCORBIC ACID) 500 MG tablet Take 500 mg by mouth daily.     [provider]    Current Outpatient Medications  Medication Sig Dispense Refill   acetaminophen  (TYLENOL) 160 MG/5ML liquid Take 325 mg by mouth every 4 (four) hours as needed for pain.     aspirin EC 81 MG tablet Take 81 mg by mouth daily as needed (afib).      clotrimazole (LOTRIMIN) 1 % cream Apply 1 application topically daily as needed (jock itch).     diltiazem (CARDIZEM CD) 120 MG 24 hr capsule Take 1 capsule (120 mg total) by mouth daily. 90 capsule 3   docusate sodium (COLACE) 100 MG capsule Take 100 mg by mouth as needed.      etanercept (ENBREL) 50 MG/ML injection Inject 50 mg into the skin every Monday.      ibuprofen (ADVIL,MOTRIN) 200 MG tablet Take 600 mg by mouth every 8 (eight) hours as needed for moderate pain.      LORazepam (ATIVAN) 1 MG tablet TAKE 1 TABLET BY MOUTH AT BEDTIME 30 tablet 0   melatonin 5 MG TABS Take 5 mg by mouth.     Multiple Vitamin (MULTIVITAMIN) tablet Take 1 tablet by mouth daily.     neomycin-bacitracin-polymyxin (NEOSPORIN) ointment Apply 1 application topically as needed for wound care.     omeprazole (PRILOSEC) 40 MG capsule Take 1 capsule (40 mg total) by mouth every evening. 90 capsule 0   Probiotic Product (PROBIOTIC-10 ULTIMATE) CAPS Take by mouth.     simethicone (MYLICON) 096 MG chewable tablet Chew 125 mg by mouth every 6 (six) hours as needed for flatulence.     vitamin C (ASCORBIC ACID) 500 MG tablet Take 500 mg by mouth daily.      Current Facility-Administered Medications  Medication Dose Route Frequency Provider Last Rate Last Admin   0.9 %  sodium chloride infusion  500 mL Intravenous Continuous Sharyn Creamer, MD        Allergies as of 07/23/2021 - Review Complete 06/05/2021  Allergen Reaction Noted   Caffeine Other (See Comments) 04/25/2012   Flecainide Other (See Comments) 01/24/2015   Methotrexate derivatives Nausea And Vomiting 01/16/2015   Pseudoephedrine-ibuprofen Other (See Comments) 04/25/2012   Penicillins Rash     Family History  Problem Relation Age of Onset   Prostate cancer Father    Heart disease Mother     Rheum arthritis Mother     Social History   Socioeconomic History   Marital status: Married    Spouse name: William West   Number of children: 2   Years of education: Not on file   Highest education level: Not on file  Occupational History   Occupation: [employer]    Comment: RETIRED  Tobacco Use   Smoking status: Former    Packs/day: 0.50    Years: 45.00    Pack years: 22.50    Types: Cigarettes    Quit date: 11/13/2009    Years since quitting: 11.6   Smokeless tobacco: Never  Vaping Use   Vaping Use: Never used  Substance and Sexual Activity  Alcohol use: No    Alcohol/week: 0.0 standard drinks   Drug use: No   Sexual activity: Never  Other Topics Concern   Not on file  Social History Narrative   One son with HBP. One son with IDDM. Exercises regularly at the [facility]. Eats well. Down 30 pounds with the lifestyle change. Exercise keeps him from feeling so stiff. Weighs daily. Baseline 199-204.    Social Determinants of Health   Financial Resource Strain: Low Risk    Difficulty of Paying Living Expenses: Not hard at all  Food Insecurity: No Food Insecurity   Worried About Charity fundraiser in the Last Year: Never true   [place] in the Last Year: Never true  Transportation Needs: No Transportation Needs   Lack of Transportation (Medical): No   Lack of Transportation (Non-Medical): No  Physical Activity: Sufficiently Active   Days of Exercise per Week: 5 days   Minutes of Exercise per Session: 60 min  Stress: No Stress Concern Present   Feeling of Stress : Not at all  Social Connections: Socially Integrated   Frequency of Communication with Friends and Family: More than three times a week   Frequency of Social Gatherings with Friends and Family: More than three times a week   Attends Religious Services: More than 4 times per year   Active Member of [employer] or Organizations: Yes   Attends Archivist Meetings: 1 to 4 times per year   Marital Status: Married   Human resources officer Violence: Not At Risk   Fear of Current or Ex-Partner: No   Emotionally Abused: No   Physically Abused: No   Sexually Abused: No    Physical Exam: Vital signs in last 24 hours: BP 120/77    Pulse (!) 58    Temp (!) 97.5 F (36.4 C) (Temporal)    Ht 5\' 11"  (1.803 m)    Wt 204 lb (92.5 kg)    SpO2 98%    BMI 28.45 kg/m  GEN: NAD EYE: Sclerae anicteric ENT: MMM CV: Non-tachycardic Pulm: No increased WOB GI: Soft NEURO:  Alert & Oriented   [name], MD [facility]   07/23/2021 10:41 AM

## 2021-07-23 NOTE — Op Note (Signed)
[place] ?Patient Name: William West ?Procedure Date: 07/23/2021 11:19 AM ?MRN: [id number] ?Endoscopist: Sonny Masters "[name] ,  ?Age: 71 ?Referring MD:  ?Date of Birth: 1951/01/05 ?Gender: Male ?Account #: [id number] ?Procedure:                Upper GI endoscopy ?Indications:              Heartburn, Follow-up of gastric polyps ?Medicines:                Monitored Anesthesia Care ?Procedure:                Pre-Anesthesia Assessment: ?                          - Prior to the procedure, a History and Physical  ?                          was performed, and patient medications and  ?                          allergies were reviewed. The patient's tolerance of  ?                          previous anesthesia was also reviewed. The risks  ?                          and benefits of the procedure and the sedation  ?                          options and risks were discussed with the patient.  ?                          All questions were answered, and informed consent  ?                          was obtained. Prior Anticoagulants: The patient has  ?                          taken no previous anticoagulant or antiplatelet  ?                          agents. ASA Grade Assessment: III - A patient with  ?                          severe systemic disease. After reviewing the risks  ?                          and benefits, the patient was deemed in  ?                          satisfactory condition to undergo the procedure. ?                          After obtaining informed consent, the endoscope was  ?  passed under direct vision. Throughout the  ?                          procedure, the patient's blood pressure, pulse, and  ?                          oxygen saturations were monitored continuously. The  ?                          GIF [id number] #[id number] was introduced through the  ?                          mouth, and advanced to the second part of duodenum.  ?                          The upper GI  endoscopy was accomplished without  ?                          difficulty. The patient tolerated the procedure  ?                          well. ?Scope In: ?Scope Out: ?Findings:                 Biopsies were taken with a cold forceps in the  ?                          proximal esophagus and in the distal esophagus for  ?                          histology. ?                          A non-obstructing Schatzki ring was found at the  ?                          gastroesophageal junction. ?                          A hiatal hernia was present. ?                          Multiple large sessile polyps with no bleeding and  ?                          no stigmata of recent bleeding were found in the  ?                          gastric body. Biopsies were taken with a cold  ?                          forceps for histology. ?                          Localized mild inflammation characterized by  ?  congestion (edema) and erythema was found in the  ?                          gastric antrum. Biopsies were taken with a cold  ?                          forceps for histology. ?                          The examined duodenum was normal. Biopsies were  ?                          taken with a cold forceps for histology. ?Complications:            No immediate complications. ?Estimated Blood Loss:     Estimated blood loss was minimal. ?Impression:               - Biopsies were taken with a cold forceps for  ?                          histology in the proximal esophagus and in the  ?                          distal esophagus. ?                          - Non-obstructing Schatzki ring. ?                          - Hiatal hernia. ?                          - Multiple gastric polyps. Biopsied. ?                          - Gastritis. Biopsied. ?                          - Normal examined duodenum. Biopsied. ?Recommendation:           - Await pathology results. ?                          - Perform a colonoscopy  today. ?Georgian Co,  ?07/23/2021 11:54:40 AM ?

## 2021-07-23 NOTE — Op Note (Signed)
[facility] ?Patient Name: William West ?Procedure Date: 07/23/2021 11:19 AM ?MRN: [id number] ?Endoscopist: Sonny Masters "William West ,  ?Age: 71 ?Referring MD:  ?Date of Birth: 03-Feb-1951 ?Gender: Male ?Account #: [id number] ?Procedure:                Colonoscopy ?Indications:              Screening for colorectal malignant neoplasm ?Medicines:                Monitored Anesthesia Care ?Procedure:                Pre-Anesthesia Assessment: ?                          - Prior to the procedure, a History and Physical  ?                          was performed, and patient medications and  ?                          allergies were reviewed. The patient's tolerance of  ?                          previous anesthesia was also reviewed. The risks  ?                          and benefits of the procedure and the sedation  ?                          options and risks were discussed with the patient.  ?                          All questions were answered, and informed consent  ?                          was obtained. Prior Anticoagulants: The patient has  ?                          taken no previous anticoagulant or antiplatelet  ?                          agents. ASA Grade Assessment: III - A patient with  ?                          severe systemic disease. After reviewing the risks  ?                          and benefits, the patient was deemed in  ?                          satisfactory condition to undergo the procedure. ?                          After obtaining informed consent, the colonoscope  ?  was passed under direct vision. Throughout the  ?                          procedure, the patient's blood pressure, pulse, and  ?                          oxygen saturations were monitored continuously. The  ?                          CF [id number] #[id number] was introduced through the anus  ?                          and advanced to the the terminal ileum. The  ?                          colonoscopy was  performed without difficulty. The  ?                          patient tolerated the procedure well. The quality  ?                          of the bowel preparation was excellent. The  ?                          terminal ileum, ileocecal valve, appendiceal  ?                          orifice, and rectum were photographed. ?Scope In: 11:35:21 AM ?Scope Out: 11:49:41 AM ?Scope Withdrawal Time: 0 hours 10 minutes 59 seconds  ?Total Procedure Duration: 0 hours 14 minutes 20 seconds  ?Findings:                 The terminal ileum appeared normal. ?                          Two sessile polyps were found in the ascending  ?                          colon and cecum. The polyps were 3 to 4 mm in size.  ?                          These polyps were removed with a cold snare.  ?                          Resection and retrieval were complete. ?                          Multiple diverticula were found in the sigmoid  ?                          colon. ?                          Non-bleeding internal hemorrhoids were found during  ?  retroflexion. ?Complications:            No immediate complications. ?Estimated Blood Loss:     Estimated blood loss was minimal. ?Impression:               - The examined portion of the ileum was normal. ?                          - Two 3 to 4 mm polyps in the ascending colon and  ?                          in the cecum, removed with a cold snare. Resected  ?                          and retrieved. ?                          - Diverticulosis in the sigmoid colon. ?                          - Non-bleeding internal hemorrhoids. ?Recommendation:           - Discharge patient to home (with escort). ?                          - Await pathology results. ?                          - The findings and recommendations were discussed  ?                          with the patient. ?Georgian West,  ?07/23/2021 11:58:06 AM ?

## 2021-07-23 NOTE — Progress Notes (Signed)
To Pacu, VSS. Report to [email or web address] 

## 2021-07-27 ENCOUNTER — Telehealth: Payer: Self-pay | Admitting: *Deleted

## 2021-07-27 ENCOUNTER — Telehealth: Payer: Self-pay

## 2021-07-27 NOTE — Telephone Encounter (Signed)
Second attempt, left VM.  

## 2021-07-27 NOTE — Telephone Encounter (Signed)
Called # [phone] and left a message we tried to reach pt for a follow up call. maw  ?

## 2021-07-30 ENCOUNTER — Encounter: Payer: Self-pay | Admitting: Internal Medicine

## 2021-07-30 NOTE — Progress Notes (Signed)
Adding some additional documentation from Dr. Lorie Apley records: ? ?Colonoscopy 06/08/04: Small internal hemorrhoid. Small sessile polyp biopsied from rectosigmoid colon. Scattered sigmoid diverticulosis. Otherwise normal colonoscopy up to the cecum.  ?Path: Fragments of benign mucosa. ? ?Colonoscopy Path 03/25/10: ?Fragments of hyperplastic polyp ? ?VCE 01/24/15: ?Gastric polyps. Normal small bowel mucosa for 1st 4 hours of the study. Very dark stool encountered in the distal small bowel from 4 hours until cecal images at 6 hour 41 minutes. Dark stool in the right colon totally obscuring colon images.  ?

## 2021-08-05 DIAGNOSIS — R7611 Nonspecific reaction to tuberculin skin test without active tuberculosis: Secondary | ICD-10-CM | POA: Diagnosis not present

## 2021-08-13 ENCOUNTER — Telehealth: Payer: Self-pay | Admitting: Emergency Medicine

## 2021-08-13 NOTE — Telephone Encounter (Signed)
I called to get more information and I was not able to talk to the patient to get more information. He was asking about his TB. Please advise. ?

## 2021-08-14 NOTE — Telephone Encounter (Signed)
I do not see any QuantiFERON gold results, must have been done outside our system.  Will review data with him at [facility].  Need to get his outside labs ?

## 2021-08-14 NOTE — Telephone Encounter (Signed)
Spoke with William West who states he had a quantafarin gold blood drawn done twice recently and both came back positive. William West states he was then referred by his PCP to the [facility] but has no interest in going there as he lives in [place]. William West did contact [facility] Department and was informed that Dr. Lamonte Sakai should be involved with or manage William West's. William West has a lot of questions and is scheduled for OV with Dr. Lamonte Sakai on 08/21/21.  ? ?Dr. Lamonte Sakai please advise.  ?

## 2021-08-21 ENCOUNTER — Ambulatory Visit (INDEPENDENT_AMBULATORY_CARE_PROVIDER_SITE_OTHER): Payer: Medicare Other | Admitting: Emergency Medicine

## 2021-08-21 ENCOUNTER — Encounter: Payer: Self-pay | Admitting: Emergency Medicine

## 2021-08-21 VITALS — BP 122/76 | HR 74 | Temp 97.7°F | Ht 71.5 in | Wt 207.2 lb

## 2021-08-21 DIAGNOSIS — Z227 Latent tuberculosis: Secondary | ICD-10-CM

## 2021-08-21 DIAGNOSIS — R911 Solitary pulmonary nodule: Secondary | ICD-10-CM | POA: Diagnosis not present

## 2021-08-21 MED ORDER — ISONIAZID 300 MG PO TABS: 300.0000 mg | ORAL_TABLET | Freq: Every day | ORAL | 0 refills | Status: DC

## 2021-08-21 NOTE — Assessment & Plan Note (Signed)
2 positive QuantiFERON gold test in March 2023, was -1-year ago.  His stable pulmonary nodules predated the conversion to positive QuantiFERON, had been stable.  Do not suspect that these are related but for completeness I will check a CT scan of the chest to ensure they have not changed.  He is completely asymptomatic.  I think this is latent TB.  He is unaware of when he may have been exposed.  He will need to be treated in order to continue his Enbrel with rheumatology.  I will go ahead and start him on the isoniazid 300 mg daily, plan for 39-monthcourse.  If his repeat QuantiFERON gold is negative then we will have to decide whether he can stop the isoniazid. ? ?We will repeat your CT scan of the chest now to assess for pulmonary nodules for stability. ?We will repeat your QuantiFERON gold test ?We will start isoniazid 300 mg daily.  You need to take this on an empty stomach.  Depending on your test results above we will decide together whether you need to continue the medication.  The usual regimen is 6 months. ?Follow with Dr BLamonte Sakainext available after your CT scan so we can review the results together. ?

## 2021-08-21 NOTE — Addendum Note (Signed)
Addended by: Gavin Potters R on: 08/21/2021 11:35 AM ? ? Modules accepted: Orders ? ?

## 2021-08-21 NOTE — Progress Notes (Signed)
? ?Subjective:  ? ? Patient ID: William West, male    DOB: Jun 04, 1950, 71 y.o.   MRN: [id number] ? ?HPI ? ?ROV 03/19/21 --follow-up visit 71 year old gentleman, former smoker with mild COPD.  He has rheumatoid arthritis and is on weekly Enbrel (previously MTX).  We have been following pulmonary nodular disease going back to 11/2016 (stable).  He had COVID-19 in July 2022, treated with mulnupiravir and m-Ab. He reports that he has a terrible HA and sore throat, but that he recovered to baseline. He has a good functional capacity. Deals w some sciatica but his joint disease is well controlled. No cough. Off flonase NS, tessalon.  ? ?Chest x-ray performed today reviewed by me, shows clear lungs, no new infiltrates or pulmonary nodules ? ? ?ROV 08/21/21 --William West follows up today.  He is a 71 year old gentleman with mild COPD.  Also with rheumatoid arthritis previously on methotrexate but managed on Enbrel.  I have followed him in the past for pulmonary nodular disease.  He gets surveillance TB testing at rheumatology since he is on biologic therapy.  Reports that his quantGOLD was positive (repeat confirmed). No fevers, chills, sweats, cough or any other sx.  ? ?Labs: ?QuantiFERON gold 07/21/2020 >> negative ?QuantiFERON gold 07/22/2021 >> positive ?QuantiFERON gold 08/05/2021 >> positive ? ?Most recent CT scan of the chest 01/07/2020 reviewed and showed that his left lung pulmonary nodules were unchanged going back to 02/24/2017 ? ? ? ?Review of Systems ?As per HPI ? ?   ?Objective:  ? Physical Exam ?Vitals:  ? 08/21/21 1027  ?BP: 122/76  ?Pulse: 74  ?Temp: 97.7 ?F (36.5 ?C)  ?TempSrc: Oral  ?SpO2: 99%  ?Weight: 207 lb 3.2 oz (94 kg)  ?Height: 5' 11.5" (1.816 m)  ? ?Gen: Pleasant, well-nourished, in no distress,  normal affect ? ?ENT: No lesions,  mouth clear,  oropharynx clear, no postnasal drip ? ?Neck: No JVD, no stridor ? ?Lungs: No use of accessory muscles, no crackles or wheezing on normal respiration, no wheeze on  forced expiration ? ?Cardiovascular: RRR, heart sounds normal, no murmur or gallops, no peripheral edema ? ?Musculoskeletal: No deformities, no cyanosis or clubbing ? ?Neuro: alert, awake, non focal ? ?Skin: Warm, no lesions or rash ? ? ?   ?Assessment & Plan:  ?TB lung, latent ?2 positive QuantiFERON gold test in March 2023, was -1-year ago.  His stable pulmonary nodules predated the conversion to positive QuantiFERON, had been stable.  Do not suspect that these are related but for completeness I will check a CT scan of the chest to ensure they have not changed.  He is completely asymptomatic.  I think this is latent TB.  He is unaware of when he may have been exposed.  He will need to be treated in order to continue his Enbrel with rheumatology.  I will go ahead and start him on the isoniazid 300 mg daily, plan for 64-monthcourse.  If his repeat QuantiFERON gold is negative then we will have to decide whether he can stop the isoniazid. ? ?We will repeat your CT scan of the chest now to assess for pulmonary nodules for stability. ?We will repeat your QuantiFERON gold test ?We will start isoniazid 300 mg daily.  You need to take this on an empty stomach.  Depending on your test results above we will decide together whether you need to continue the medication.  The usual regimen is 6 months. ?Follow with Dr BLamonte Sakainext available after your CT  scan so we can review the results together. ? ?Time spent 53 minutes ? ?Baltazar Apo, MD, PhD ?08/21/2021, 10:56 AM ?William West ?[phone] or if no answer [phone] ? ?

## 2021-08-21 NOTE — Addendum Note (Signed)
Addended by: Gavin Potters R on: 08/21/2021 11:16 AM ? ? Modules accepted: Orders ? ?

## 2021-08-21 NOTE — Patient Instructions (Signed)
We will repeat your CT scan of the chest now to assess for pulmonary nodules for stability. ?We will repeat your QuantiFERON gold test ?We will start isoniazid 300 mg daily.  You need to take this on an empty stomach.  Depending on your test results above we will decide together whether you need to continue the medication.  The usual regimen is 6 months. ?Follow with Dr Lamonte Sakai next available after your CT scan so we can review the results together. ?

## 2021-08-26 LAB — QUANTIFERON-TB GOLD PLUS
Mitogen-NIL: >10 IU/mL
NIL: 0.03 IU/mL
QuantiFERON-TB Gold Plus: NEGATIVE
TB1-NIL: 0.01 IU/mL
TB2-NIL: 0.02 IU/mL

## 2021-09-11 ENCOUNTER — Other Ambulatory Visit: Payer: Self-pay | Admitting: Internal Medicine

## 2021-09-11 DIAGNOSIS — K219 Gastro-esophageal reflux disease without esophagitis: Secondary | ICD-10-CM

## 2021-09-17 IMAGING — DX DG SHOULDER 2+V*L*
3 series · 3 of 3 positions shown · non-contrast
Comparison: None.

CLINICAL DATA: Fall with shoulder pain

EXAM:
LEFT SHOULDER - 2+ VIEW

[shoulder grashey]
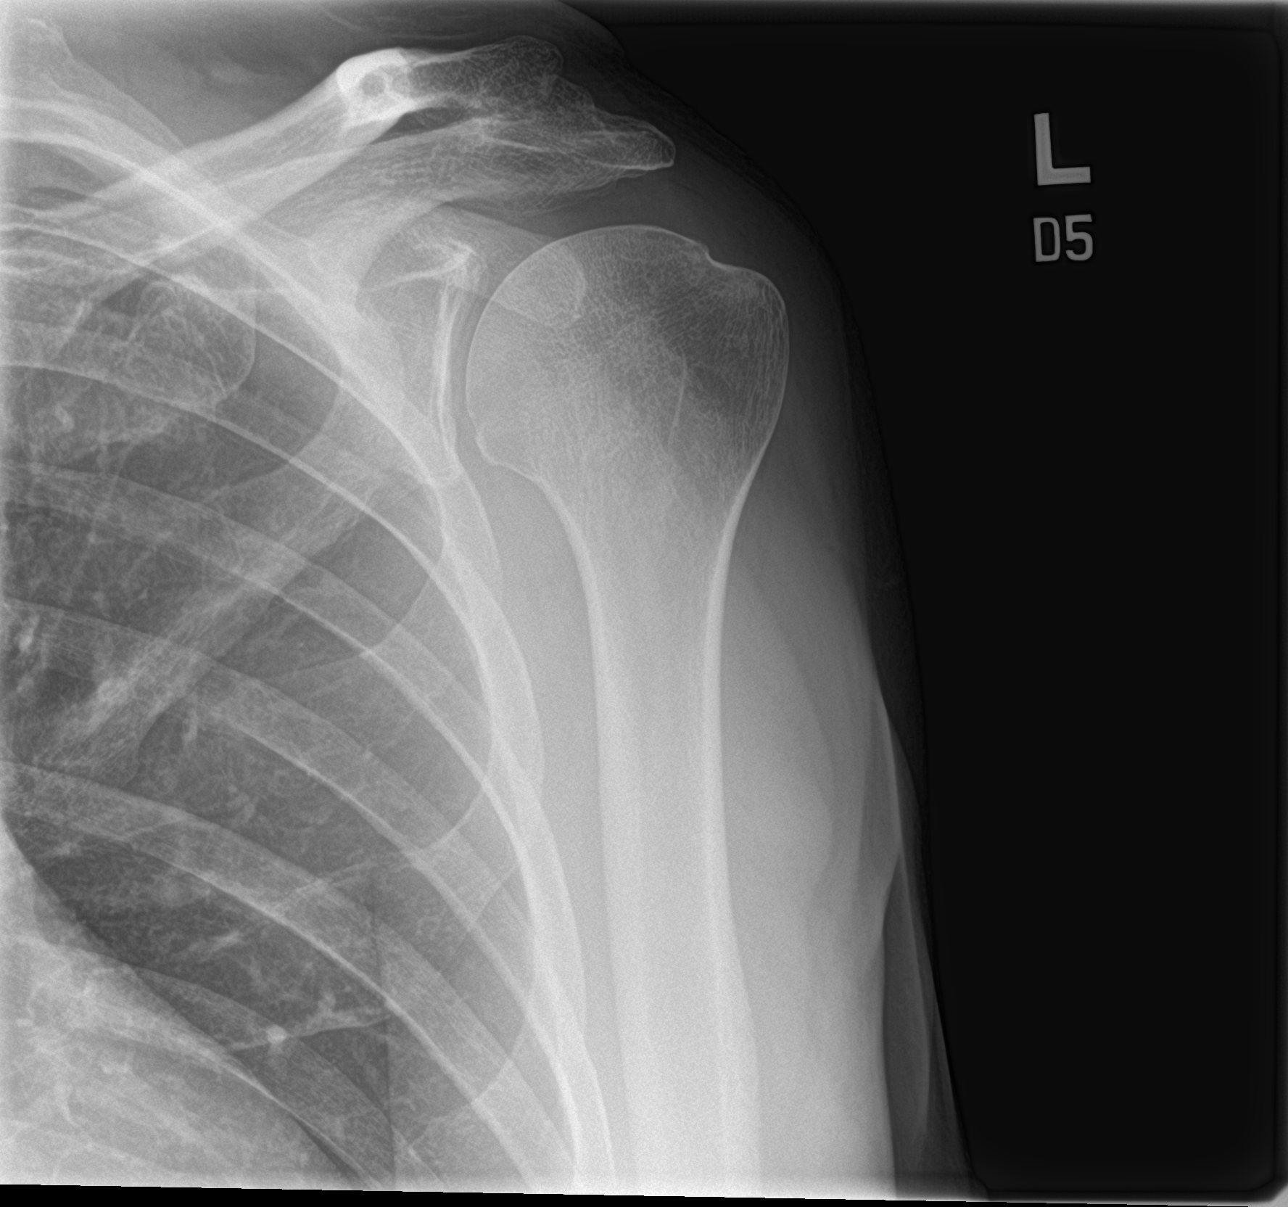

[shoulder y view]
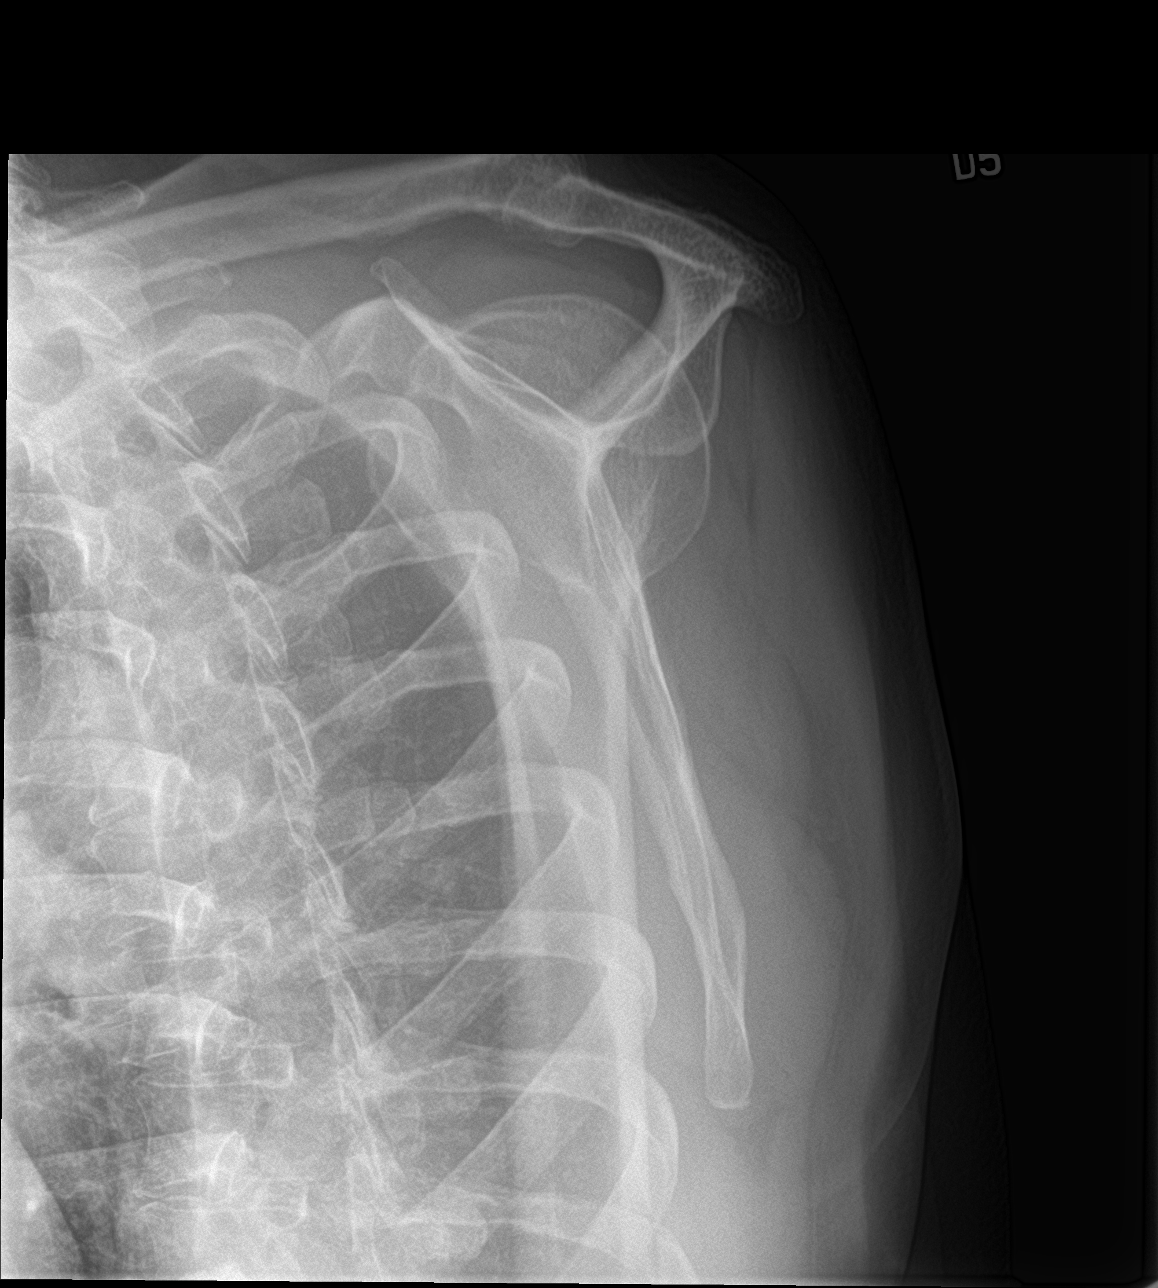

[shoulder axillary]
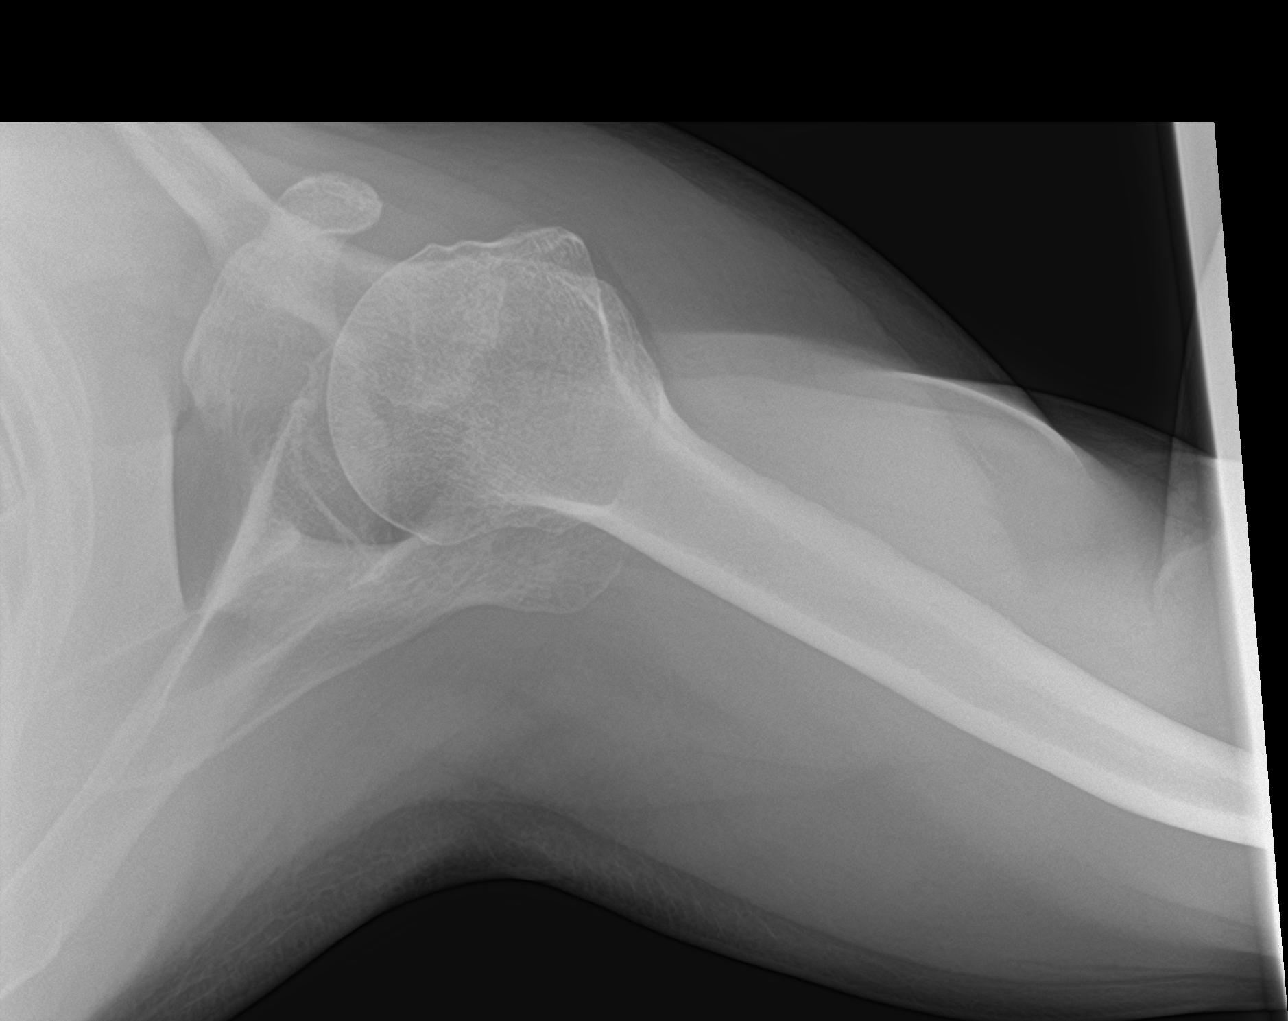

[3 of 3 positions shown; findings below may reference images not displayed]

FINDINGS: No fracture or malalignment. Mild AC joint and glenohumeral
degenerative change.
IMPRESSION: No acute osseous abnormality

## 2021-09-18 ENCOUNTER — Ambulatory Visit
Admission: RE | Admit: 2021-09-18 | Discharge: 2021-09-18 | Disposition: A | Discharge status: Home / Self Care | Payer: Medicare Other | Source: Ambulatory Visit | Attending: Emergency Medicine | Admitting: Emergency Medicine

## 2021-09-18 DIAGNOSIS — R911 Solitary pulmonary nodule: Secondary | ICD-10-CM | POA: Diagnosis not present

## 2021-09-29 ENCOUNTER — Ambulatory Visit (INDEPENDENT_AMBULATORY_CARE_PROVIDER_SITE_OTHER): Payer: Medicare Other | Admitting: Emergency Medicine

## 2021-09-29 ENCOUNTER — Encounter: Payer: Self-pay | Admitting: Emergency Medicine

## 2021-09-29 DIAGNOSIS — R911 Solitary pulmonary nodule: Secondary | ICD-10-CM | POA: Diagnosis not present

## 2021-09-29 DIAGNOSIS — Z227 Latent tuberculosis: Secondary | ICD-10-CM

## 2021-09-29 DIAGNOSIS — U071 COVID-19: Secondary | ICD-10-CM | POA: Diagnosis not present

## 2021-09-29 NOTE — Assessment & Plan Note (Signed)
His follow-up QuantiFERON gold was negative.  Based on this I do not think he has latent TB, I asked him to stop the isoniazid.  He will have annual TB testing through rheumatology since he is on Humira. ? ?Based on your negative QuantiFERON gold test I do not believe we need to continue isoniazid. ?You should be able to be back on your Humira, will need to coordinate with rheumatology ?Follow Dr. Lamonte Sakai in 12 months or sooner if you have any problems ?

## 2021-09-29 NOTE — Progress Notes (Signed)
? ?Subjective:  ? ? Patient ID: William West, male    DOB: 1951-04-23, 71 y.o.   MRN: [id number] ? ?HPI ? ?ROV 03/19/21 --follow-up visit 71 year old gentleman, former smoker with mild COPD.  He has rheumatoid arthritis and is on weekly Enbrel (previously MTX).  We have been following pulmonary nodular disease going back to 11/2016 (stable).  He had COVID-19 in July 2022, treated with mulnupiravir and m-Ab. He reports that he has a terrible HA and sore throat, but that he recovered to baseline. He has a good functional capacity. Deals w some sciatica but his joint disease is well controlled. No cough. Off flonase NS, tessalon.  ? ?Chest x-ray performed today reviewed by me, shows clear lungs, no new infiltrates or pulmonary nodules ? ? ?ROV 08/21/21 --William West follows up today.  He is a 71 year old gentleman with mild COPD.  Also with rheumatoid arthritis previously on methotrexate but managed on Enbrel.  I have followed him in the past for pulmonary nodular disease.  He gets surveillance TB testing at rheumatology since he is on biologic therapy.  Reports that his quantGOLD was positive (repeat confirmed). No fevers, chills, sweats, cough or any other sx.  ? ?Labs: ?QuantiFERON gold 07/21/2020 >> negative ?QuantiFERON gold 07/22/2021 >> positive ?QuantiFERON gold 08/05/2021 >> positive ? ?Most recent CT scan of the chest 01/07/2020 reviewed and showed that his left lung pulmonary nodules were unchanged going back to 02/24/2017 ? ?ROV 09/29/21 --follow-up 71 year old gentleman with history of mild COPD and rheumatoid arthritis that is been managed on Enbrel.  Pulmonary nodular disease that has been stable for years, presumed related to his RA.  No evidence of changes due to prior methotrexate use.  He had surveillance TB testing with positive QuantiFERON gold detected on 3/1 and 08/05/2021.  His Enbrel had to be held.  He did not have any constitutional or respiratory symptoms.  I plan to start him on isoniazid for possible  latent TB but also repeated his QuantiFERON on 08/21/2021 which was negative.  Based on this I think the March labs were probably false positive.  We repeated his CT as below. ? ?CT chest 09/18/2021 reviewed by me shows no mediastinal or hilar lymphadenopathy, a stable left lower lobe 8 x 7 mm pulmonary nodule now with some calcification consistent with benign partially calcified granuloma.  No other nodules noted. ? ?Review of Systems ?As per HPI ? ?   ?Objective:  ? Physical Exam ?Vitals:  ? 09/29/21 1206  ?BP: 120/72  ?Pulse: 61  ?Temp: 97.9 ?F (36.6 ?C)  ?TempSrc: Oral  ?SpO2: 96%  ?Weight: 204 lb 6.4 oz (92.7 kg)  ?Height: 5' 11.5" (1.816 m)  ? ?Gen: Pleasant, well-nourished, in no distress,  normal affect ? ?ENT: No lesions,  mouth clear,  oropharynx clear, no postnasal drip ? ?Neck: No JVD, no stridor ? ?Lungs: No use of accessory muscles, no crackles or wheezing on normal respiration, no wheeze on forced expiration ? ?Cardiovascular: RRR, heart sounds normal, no murmur or gallops, no peripheral edema ? ?Musculoskeletal: No deformities, no cyanosis or clubbing ? ?Neuro: alert, awake, non focal ? ?Skin: Warm, no lesions or rash ? ? ?   ?Assessment & Plan:  ?TB lung, latent ?His follow-up QuantiFERON gold was negative.  Based on this I do not think he has latent TB, I asked him to stop the isoniazid.  He will have annual TB testing through rheumatology since he is on Humira. ? ?Based on your negative QuantiFERON gold test  I do not believe we need to continue isoniazid. ?You should be able to be back on your Humira, will need to coordinate with rheumatology ?Follow Dr. Lamonte Sakai in 12 months or sooner if you have any problems ? ?Pulmonary nodule, left ?Pulmonary nodules unchanged, stable, now with some calcification.  He should not need serial follow-up unless there is a clinical change.  I reassured him about this ? ? ?Baltazar Apo, MD, PhD ?09/29/2021, 12:48 PM ?[facility] ?[phone] or if  no answer [phone] ? ?

## 2021-09-29 NOTE — Assessment & Plan Note (Signed)
Pulmonary nodules unchanged, stable, now with some calcification.  He should not need serial follow-up unless there is a clinical change.  I reassured him about this ?

## 2021-09-29 NOTE — Patient Instructions (Addendum)
Based on your negative QuantiFERON gold test I do not believe we need to continue isoniazid. ?You should be able to be back on your Humira, will need to coordinate with rheumatology ?Your CT scan of the chest is stable.  We do not need to repeat any serial CT scans to follow pulmonary nodules unless you experience a clinical change. ?Follow Dr. Lamonte Sakai in 12 months or sooner if you have any problems ?

## 2021-11-30 ENCOUNTER — Other Ambulatory Visit: Payer: Self-pay | Admitting: Internal Medicine

## 2021-11-30 DIAGNOSIS — K219 Gastro-esophageal reflux disease without esophagitis: Secondary | ICD-10-CM

## 2022-01-27 DIAGNOSIS — M0579 Rheumatoid arthritis with rheumatoid factor of multiple sites without organ or systems involvement: Secondary | ICD-10-CM | POA: Diagnosis not present

## 2022-01-27 DIAGNOSIS — E663 Overweight: Secondary | ICD-10-CM | POA: Diagnosis not present

## 2022-01-27 DIAGNOSIS — Z79899 Other long term (current) drug therapy: Secondary | ICD-10-CM | POA: Diagnosis not present

## 2022-01-27 DIAGNOSIS — Z6828 Body mass index (BMI) 28.0-28.9, adult: Secondary | ICD-10-CM | POA: Diagnosis not present

## 2022-01-29 DIAGNOSIS — Z23 Encounter for immunization: Secondary | ICD-10-CM | POA: Diagnosis not present

## 2022-02-15 ENCOUNTER — Encounter: Payer: Self-pay | Admitting: *Deleted

## 2022-02-24 DIAGNOSIS — I8392 Asymptomatic varicose veins of left lower extremity: Secondary | ICD-10-CM | POA: Diagnosis not present

## 2022-02-24 DIAGNOSIS — D692 Other nonthrombocytopenic purpura: Secondary | ICD-10-CM | POA: Diagnosis not present

## 2022-02-24 DIAGNOSIS — L570 Actinic keratosis: Secondary | ICD-10-CM | POA: Diagnosis not present

## 2022-02-24 DIAGNOSIS — S70362A Insect bite (nonvenomous), left thigh, initial encounter: Secondary | ICD-10-CM | POA: Diagnosis not present

## 2022-02-24 DIAGNOSIS — Z23 Encounter for immunization: Secondary | ICD-10-CM | POA: Diagnosis not present

## 2022-02-24 DIAGNOSIS — L814 Other melanin hyperpigmentation: Secondary | ICD-10-CM | POA: Diagnosis not present

## 2022-02-24 DIAGNOSIS — D1801 Hemangioma of skin and subcutaneous tissue: Secondary | ICD-10-CM | POA: Diagnosis not present

## 2022-03-04 ENCOUNTER — Telehealth: Payer: Self-pay | Admitting: Family Medicine

## 2022-03-04 NOTE — Telephone Encounter (Signed)
See note

## 2022-03-04 NOTE — Telephone Encounter (Signed)
Patient's wife William West requests RX for [employer] (states Patient has taken this medication before) for Patient's cough (Patient is scheduled for appointment 03/05/22-first available appointment) so that Patient can sleep tonight-Cough is keeping Patient awake at night.  Requests RX for Tessalon Pearls be sent asap to:  [place] [place] - [id number] [place] [address] [place] Phone: [phone]  Fax: [phone]

## 2022-03-05 ENCOUNTER — Encounter: Payer: Self-pay | Admitting: Family Medicine

## 2022-03-05 ENCOUNTER — Ambulatory Visit (INDEPENDENT_AMBULATORY_CARE_PROVIDER_SITE_OTHER): Payer: Medicare Other | Admitting: Family Medicine

## 2022-03-05 VITALS — BP 114/71 | HR 95 | Temp 97.8°F | Ht 71.5 in | Wt 206.6 lb

## 2022-03-05 DIAGNOSIS — M069 Rheumatoid arthritis, unspecified: Secondary | ICD-10-CM | POA: Diagnosis not present

## 2022-03-05 DIAGNOSIS — I480 Paroxysmal atrial fibrillation: Secondary | ICD-10-CM

## 2022-03-05 MED ORDER — AZELASTINE HCL 0.1 % NA SOLN: 2.0000 | Freq: Two times a day (BID) | NASAL | 12 refills | Status: DC

## 2022-03-05 MED ORDER — DOXYCYCLINE HYCLATE 100 MG PO TABS: 100.0000 mg | ORAL_TABLET | Freq: Two times a day (BID) | ORAL | 0 refills | Status: DC

## 2022-03-05 MED ORDER — BENZONATATE 200 MG PO CAPS: 200.0000 mg | ORAL_CAPSULE | Freq: Two times a day (BID) | ORAL | 0 refills | Status: DC | PRN

## 2022-03-05 NOTE — Assessment & Plan Note (Signed)
Follows with cardiology.  Appears to be regular rate and rhythm today.  On aspirin and Cardizem.

## 2022-03-05 NOTE — Patient Instructions (Signed)
It was very nice to see you today!  Please start the nasal spray and the cough medication.  Take the antibiotic if your symptoms worsen or not improving in the next several days.  What is know if not improving by next week.   Take care, Dr [name]  PLEASE NOTE:  If you had any lab tests please let us know if you have not heard back within a few days. You may see your results on mychart before we have a chance to review them but we will give you a call once they are reviewed by [place]. If we ordered any referrals today, please let us know if you have not heard from their office within the next week.   Please try these tips to maintain a healthy lifestyle:  Eat at least 3 REAL meals and 1-2 snacks per day.  Aim for no more than 5 hours between eating.  If you eat breakfast, please do so within one hour of getting up.   Each meal should contain half fruits/vegetables, one quarter protein, and one quarter carbs (no bigger than a computer mouse)  Cut down on sweet beverages. This includes juice, soda, and sweet tea.   Drink at least 1 glass of water with each meal and aim for at least 8 glasses per day  Exercise at least 150 minutes every week.

## 2022-03-05 NOTE — Telephone Encounter (Signed)
We saw him for this today.

## 2022-03-05 NOTE — Progress Notes (Signed)
   William West is a 71 y.o. male who presents today for an office visit.  Assessment/Plan:  New/Acute Problems: Sinusitis  No red flags.  No signs of systemic infection.  We will start Astelin nasal spray and Tessalon.  We will send pocket prescription for doxycycline with instruction to not start unless symptoms fail to improve or worsen over the next several days.  Encouraged hydration.  He can continue over-the-counter medications.  We discussed reasons return to care.  Follow-up as needed.  Chronic Problems Addressed Today: Atrial fibrillation (William West), on Cardizem daily, with prn ASA and Diltiazem Follows with cardiology.  Appears to be regular rate and rhythm today.  On aspirin and Cardizem.  Rheumatoid arthritis (William West), on Enbrel, followed by Dr. Amil Amen On Enbrel per rheumatology though does have increased risk for infection due to this.  We will be managing his sinusitis as above.     Subjective:  HPI:  Patient here with cough and congestion. Started a few days ago. A lot of cough. Tried taking tyelnol and ibuprofen with modest improvement. Also tried taking flonase with some improvement. This morning he has some improvement. Has a lot of sinus congestion. Home covid test was negative.        Objective:  Physical Exam: BP 114/71 (BP Location: Left Arm, Patient Position: Sitting)   Pulse 95   Temp 97.8 F (36.6 C) (Temporal)   Ht 5' 11.5" (1.816 m)   Wt 206 lb 9.6 oz (93.7 kg)   SpO2 94%   BMI 28.41 kg/m   Gen: No acute distress, resting comfortably HEENT: TMs with clear effusion.  OP erythematous.  Nose mucosa erythematous and boggy bilaterally with clear discharge. CV: Regular rate and rhythm with no murmurs appreciated Pulm: Normal work of breathing, clear to auscultation bilaterally with no crackles, wheezes, or rhonchi Neuro: Grossly normal, moves all extremities Psych: Normal affect and thought content      William Rahming M. William Pain, MD 03/05/2022 10:01 AM

## 2022-03-05 NOTE — Assessment & Plan Note (Signed)
On Enbrel per rheumatology though does have increased risk for infection due to this.  We will be managing his sinusitis as above.

## 2022-03-25 ENCOUNTER — Encounter: Payer: Self-pay | Admitting: Internal Medicine

## 2022-03-25 ENCOUNTER — Ambulatory Visit: Payer: Medicare Other | Attending: Internal Medicine | Admitting: Internal Medicine

## 2022-03-25 VITALS — BP 116/76 | HR 69 | Ht 72.0 in | Wt 208.0 lb

## 2022-03-25 DIAGNOSIS — I480 Paroxysmal atrial fibrillation: Secondary | ICD-10-CM | POA: Diagnosis not present

## 2022-03-25 NOTE — Patient Instructions (Signed)
Medication Instructions:  Your physician recommends that you continue on your current medications as directed. Please refer to the Current Medication list given to you today.  *If you need a refill on your cardiac medications before your next appointment, please call your pharmacy*   Lab Work: NONE   If you have labs (blood work) drawn today and your tests are completely normal, you will receive your results only by: MyChart Message (if you have MyChart) OR A paper copy in the mail If you have any lab test that is abnormal or we need to change your treatment, we will call you to review the results.   Testing/Procedures: NONE    Follow-Up: At [facility], you and your health needs are our priority.  As part of our continuing mission to provide you with exceptional heart care, we have created designated Provider Care Teams.  These Care Teams include your primary Cardiologist (physician) and Advanced Practice Providers (APPs -  Physician Assistants and Nurse Practitioners) who all work together to provide you with the care you need, when you need it.  We recommend signing up for the patient portal called "MyChart".  Sign up information is provided on this After Visit Summary.  MyChart is used to connect with patients for Virtual Visits (Telemedicine).  Patients are able to view lab/test results, encounter notes, upcoming appointments, etc.  Non-urgent messages can be sent to your provider as well.   To learn more about what you can do with MyChart, go to [email or web address].    Your next appointment:   1 year(s)  The format for your next appointment:   In Person  Provider:   Paula Ross, MD    Other Instructions Thank you for choosing [facility]!    Important Information About Sugar       

## 2022-03-25 NOTE — Progress Notes (Signed)
HPI [name] returns today for follow-up and for evaluation of syncope. He is a pleasant 71 year old man with paroxysmal atrial fibrillation, and relative intolerance to flecainide. The patient has been well-controlled with regard to his atrial fibrillation in the past. He has had episodes of syncope which were thought to be mostly related to autonomic dysfunction.  He feels well. He has had 4 episodes of atrial fib over the past year.   Allergies  Allergen Reactions   Caffeine Other (See Comments)    Causes him to go into afib   Flecainide Other (See Comments)    Caused heart to race too fast and had him in the ED   Methotrexate Derivatives Nausea And Vomiting    Increased liver enzymes   Pseudoephedrine-Ibuprofen Other (See Comments)    " afib"    Penicillins Rash    Did it involve swelling of the face/tongue/throat, SOB, or low BP? No Did it involve sudden or severe rash/hives, skin peeling, or any reaction on the inside of your mouth or nose? No Did you need to seek medical attention at a hospital or doctor's office? No When did it last happen?      40 + years If all above answers are "NO", may proceed with cephalosporin use.      Current Outpatient Medications  Medication Sig Dispense Refill   acetaminophen (TYLENOL) 160 MG/5ML liquid Take 325 mg by mouth every 4 (four) hours as needed for pain.     aspirin EC 81 MG tablet Take 81 mg by mouth daily as needed (afib).     azelastine (ASTELIN) 0.1 % nasal spray Place 2 sprays into both nostrils 2 (two) times daily. 30 mL 12   benzonatate (TESSALON) 200 MG capsule Take 1 capsule (200 mg total) by mouth 2 (two) times daily as needed for cough. 20 capsule 0   clotrimazole (LOTRIMIN) 1 % cream Apply 1 application topically daily as needed (jock itch).     diltiazem (CARDIZEM CD) 120 MG 24 hr capsule Take 1 capsule (120 mg total) by mouth daily. 90 capsule 3   docusate sodium (COLACE) 100 MG capsule Take 100 mg by mouth as  needed.      doxycycline (VIBRA-TABS) 100 MG tablet Take 1 tablet (100 mg total) by mouth 2 (two) times daily. 14 tablet 0   etanercept (ENBREL) 50 MG/ML injection Inject 50 mg into the skin every Monday.      ibuprofen (ADVIL,MOTRIN) 200 MG tablet Take 600 mg by mouth every 8 (eight) hours as needed for moderate pain.      LORazepam (ATIVAN) 1 MG tablet TAKE 1 TABLET BY MOUTH AT BEDTIME 30 tablet 0   melatonin 5 MG TABS Take 5 mg by mouth.     Multiple Vitamin (MULTIVITAMIN) tablet Take 1 tablet by mouth daily.     neomycin-bacitracin-polymyxin (NEOSPORIN) ointment Apply 1 application topically as needed for wound care.     omeprazole (PRILOSEC) 40 MG capsule TAKE 1 CAPSULE BY MOUTH IN THE EVENING 90 capsule 1   Probiotic Product (PROBIOTIC-10 ULTIMATE) CAPS Take by mouth.     simethicone (MYLICON) 010 MG chewable tablet Chew 125 mg by mouth every 6 (six) hours as needed for flatulence.     vitamin C (ASCORBIC ACID) 500 MG tablet Take 500 mg by mouth daily.      Current Facility-Administered Medications  Medication Dose Route Frequency Provider Last Rate Last Admin   0.9 %  sodium chloride infusion  500 mL Intravenous Continuous Sharyn Creamer, MD         Past Medical History:  Diagnosis Date   A-fib [facility])    Acute blood loss anemia 01/26/2015   Allergic rhinitis    Diverticulosis of colon    Dysplastic nevus    GERD (gastroesophageal reflux disease)    GI bleeding 01/24/2015   Hypercholesterolemia    Postural dizziness with near syncope due to dehydration 12/09/2016   RA (rheumatoid arthritis) ([place])     ROS:   All systems reviewed and negative except as noted in the HPI.   Past Surgical History:  Procedure Laterality Date   COLONOSCOPY  01/24/2015   x 3   dental implant     ERCP N/A 02/18/2015   Procedure: ENDOSCOPIC RETROGRADE CHOLANGIOPANCREATOGRAPHY (ERCP);  Surgeon: Carol Ada, MD;  Location: [facility];  Service: Endoscopy;  Laterality: N/A;   GIVENS CAPSULE STUDY  N/A 01/24/2015   Procedure: GIVENS CAPSULE STUDY;  Surgeon: Juanita Craver, MD;  Location: [place];  Service: Endoscopy;  Laterality: N/A;   INGUINAL HERNIA REPAIR Right 07/27/2019   Procedure: LAPAROSCOPIC RIGHT INGUINAL HERNIA REPAIR WITH MESH;  Surgeon: Ralene Ok, MD;  Location: [place];  Service: General;  Laterality: Right;   LAPAROSCOPIC CHOLECYSTECTOMY  2011   TOE AMPUTATION  1966   TONSILLECTOMY AND ADENOIDECTOMY       Family History  Problem Relation Age of Onset   Heart disease Mother    Rheum arthritis Mother    Prostate cancer Father    Colon cancer Neg Hx    Esophageal cancer Neg Hx    Rectal cancer Neg Hx    Stomach cancer Neg Hx      Social History   Socioeconomic History   Marital status: Married    Spouse name: William West   Number of children: 2   Years of education: Not on file   Highest education level: Not on file  Occupational History   Occupation: [employer]    Comment: RETIRED  Tobacco Use   Smoking status: Former    Packs/day: 0.50    Years: 45.00    Total pack years: 22.50    Types: Cigarettes    Quit date: 11/13/2009    Years since quitting: 12.3   Smokeless tobacco: Never  Vaping Use   Vaping Use: Never used  Substance and Sexual Activity   Alcohol use: No    Alcohol/week: 0.0 standard drinks of alcohol   Drug use: No   Sexual activity: Never  Other Topics Concern   Not on file  Social History Narrative   One son with HBP. One son with IDDM. Exercises regularly at the [facility]. Eats well. Down 30 pounds with the lifestyle change. Exercise keeps him from feeling so stiff. Weighs daily. Baseline 199-204.    Social Determinants of Health   Financial Resource Strain: Low Risk  (04/24/2021)   Overall Financial Resource Strain (CARDIA)    Difficulty of Paying Living Expenses: Not hard at all  Food Insecurity: No Food Insecurity (04/24/2021)   Hunger Vital Sign    Worried About Running Out of Food in the Last Year: Never true    Ran Out of Food  in the Last Year: Never true  Transportation Needs: No Transportation Needs (04/24/2021)   PRAPARE - Hydrologist (Medical): No    Lack of Transportation (Non-Medical): No  Physical Activity: Sufficiently Active (04/24/2021)   Exercise Vital Sign    Days  of Exercise per Week: 5 days    Minutes of Exercise per Session: 60 min  Stress: No Stress Concern Present (04/24/2021)   William West    Feeling of Stress : Not at all  Social Connections: [place] (04/24/2021)   Social Connection and Isolation Panel [NHANES]    Frequency of Communication with Friends and Family: More than three times a week    Frequency of Social Gatherings with Friends and Family: More than three times a week    Attends Religious Services: More than 4 times per year    Active Member of [employer] or Organizations: Yes    Attends Archivist Meetings: 1 to 4 times per year    Marital Status: Married  Human resources officer Violence: Not At Risk (04/24/2021)   Humiliation, Afraid, Rape, and Kick questionnaire    Fear of Current or Ex-Partner: No    Emotionally Abused: No    Physically Abused: No    Sexually Abused: No     BP 116/76   Pulse 69   Ht 6' (1.829 m)   Wt 208 lb (94.3 kg)   SpO2 96%   BMI 28.21 kg/m   Physical Exam:  Well appearing NAD HEENT: Unremarkable Neck:  No JVD, no thyromegally Lymphatics:  No adenopathy Back:  No CVA tenderness Lungs:  Clear with no wheezes HEART:  Regular rate rhythm, no murmurs, no rubs, no clicks Abd:  soft, positive bowel sounds, no organomegally, no rebound, no guarding Ext:  2 plus pulses, no edema, no cyanosis, no clubbing Skin:  No rashes no nodules Neuro:  CN II through XII intact, motor grossly intact  EKG - nsr  Assess/Plan:  1. PAF -  his atrial fib has been well controlled. He will continue cardizem. His stroke risk is low. 2. Autonomic dysfunction -  he has been asymptomatic. No syncope.    Carleene Overlie Deshana Rominger,MD

## 2022-04-07 ENCOUNTER — Ambulatory Visit (INDEPENDENT_AMBULATORY_CARE_PROVIDER_SITE_OTHER): Payer: Medicare Other | Admitting: Family Medicine

## 2022-04-07 ENCOUNTER — Encounter: Payer: Self-pay | Admitting: Family Medicine

## 2022-04-07 VITALS — BP 116/71 | HR 59 | Temp 97.3°F | Ht 72.0 in | Wt 205.2 lb

## 2022-04-07 DIAGNOSIS — M069 Rheumatoid arthritis, unspecified: Secondary | ICD-10-CM

## 2022-04-07 DIAGNOSIS — E785 Hyperlipidemia, unspecified: Secondary | ICD-10-CM

## 2022-04-07 DIAGNOSIS — F5101 Primary insomnia: Secondary | ICD-10-CM

## 2022-04-07 DIAGNOSIS — R739 Hyperglycemia, unspecified: Secondary | ICD-10-CM

## 2022-04-07 DIAGNOSIS — K219 Gastro-esophageal reflux disease without esophagitis: Secondary | ICD-10-CM

## 2022-04-07 DIAGNOSIS — E559 Vitamin D deficiency, unspecified: Secondary | ICD-10-CM | POA: Diagnosis not present

## 2022-04-07 DIAGNOSIS — Z8042 Family history of malignant neoplasm of prostate: Secondary | ICD-10-CM | POA: Diagnosis not present

## 2022-04-07 DIAGNOSIS — R399 Unspecified symptoms and signs involving the genitourinary system: Secondary | ICD-10-CM | POA: Diagnosis not present

## 2022-04-07 DIAGNOSIS — I480 Paroxysmal atrial fibrillation: Secondary | ICD-10-CM

## 2022-04-07 DIAGNOSIS — Z79899 Other long term (current) drug therapy: Secondary | ICD-10-CM

## 2022-04-07 DIAGNOSIS — R351 Nocturia: Secondary | ICD-10-CM | POA: Diagnosis not present

## 2022-04-07 LAB — COMPREHENSIVE METABOLIC PANEL
ALT: 14 U/L (ref 0–53)
AST: 20 U/L (ref 0–37)
Albumin: 4.3 g/dL (ref 3.5–5.2)
Alkaline Phosphatase: 66 U/L (ref 39–117)
BUN: 15 mg/dL (ref 6–23)
CO2: 31 mEq/L (ref 19–32)
Calcium: 9.6 mg/dL (ref 8.4–10.5)
Chloride: 105 mEq/L (ref 96–112)
Creatinine, Ser: 1.14 mg/dL (ref 0.40–1.50)
GFR: 65.05 mL/min (ref >60.00)
Glucose, Bld: 95 mg/dL (ref 70–99)
Potassium: 4.6 mEq/L (ref 3.5–5.1)
Sodium: 141 mEq/L (ref 135–145)
Total Bilirubin: 0.8 mg/dL (ref 0.2–1.2)
Total Protein: 7 g/dL (ref 6.0–8.3)

## 2022-04-07 LAB — CBC
HCT: 41.6 % (ref 39.0–52.0)
Hemoglobin: 14.1 g/dL (ref 13.0–17.0)
MCHC: 33.9 g/dL (ref 30.0–36.0)
MCV: 86.9 fl (ref 78.0–100.0)
Platelets: 282 10*3/uL (ref 150.0–400.0)
RBC: 4.79 Mil/uL (ref 4.22–5.81)
RDW: 14.3 % (ref 11.5–15.5)
WBC: 7.5 10*3/uL (ref 4.0–10.5)

## 2022-04-07 LAB — URINALYSIS, ROUTINE W REFLEX MICROSCOPIC
Bilirubin Urine: NEGATIVE
Hgb urine dipstick: NEGATIVE
Ketones, ur: NEGATIVE
Leukocytes,Ua: NEGATIVE
Nitrite: NEGATIVE
RBC / HPF: NONE SEEN (ref 0–?)
Specific Gravity, Urine: 1.025 (ref 1.000–1.030)
Total Protein, Urine: NEGATIVE
Urine Glucose: NEGATIVE
Urobilinogen, UA: 0.2 (ref 0.0–1.0)
pH: 5.5 (ref 5.0–8.0)

## 2022-04-07 LAB — LIPID PANEL
Cholesterol: 151 mg/dL (ref 0–200)
HDL: 48.2 mg/dL (ref >39.00)
LDL Cholesterol: 88 mg/dL (ref 0–99)
NonHDL: 102.95
Total CHOL/HDL Ratio: 3
Triglycerides: 77 mg/dL (ref 0.0–149.0)
VLDL: 15.4 mg/dL (ref 0.0–40.0)

## 2022-04-07 LAB — VITAMIN D 25 HYDROXY (VIT D DEFICIENCY, FRACTURES): VITD: 37.3 ng/mL (ref 30.00–100.00)

## 2022-04-07 LAB — TSH: TSH: 2.42 u[IU]/mL (ref 0.35–5.50)

## 2022-04-07 LAB — PSA: PSA: 1.3 ng/mL (ref 0.10–4.00)

## 2022-04-07 LAB — HEMOGLOBIN A1C: Hgb A1c MFr Bld: 4.8 % (ref 4.6–6.5)

## 2022-04-07 LAB — VITAMIN B12: Vitamin B-12: 324 pg/mL (ref 211–911)

## 2022-04-07 NOTE — Progress Notes (Signed)
Chief Complaint:  William West is a 71 y.o. male who presents today for his annual comprehensive physical exam.    Assessment/Plan:  New/Acute Problems: Other Fatigue Likely related to poor sleep and insomnia. Check labs today to rule out other causes.   Chronic Problems Addressed Today: Dyslipidemia Check labs.  Rheumatoid arthritis ([place]), on Enbrel, followed by Dr. Amil Amen Continue management per rheumatology.  He is on Enbrel.  Atrial fibrillation ([place]), on Cardizem daily, with prn ASA and Diltiazem Follows with cardiology.  Peers to be in regular rate and rhythm today.  On aspirin and Cardizem.  GERD (gastroesophageal reflux disease), on Omeprazole On Prilosec per GI.  Check B12.  Insomnia Probably related to his nocturia as well.  He can take melatonin as needed.  Family history of prostate cancer Check PSA.  No nodules on prostate exam  Hyperglycemia Check A1c.  Lower urinary tract symptoms (LUTS) Has mild BPH.  Check PSA today.  He is not interested in prescription medications at this point.  We discussed strategies to help minimize symptoms including double voiding and decreasing consumption of fluids late in the evening.  Preventative Healthcare: Check labs. UTD on other routine vaccines and screenings. He will go to the pharmacy to get RSV vaccine.   Patient Counseling(The following topics were reviewed and/or handout was given):  -Nutrition: Stressed importance of moderation in sodium/caffeine intake, saturated fat and cholesterol, caloric balance, sufficient intake of fresh fruits, vegetables, and fiber.  -Stressed the importance of regular exercise.   -Substance Abuse: Discussed cessation/primary prevention of tobacco, alcohol, or other drug use; driving or other dangerous activities under the influence; availability of treatment for abuse.   -Injury prevention: Discussed safety belts, safety helmets, smoke detector, smoking near bedding or upholstery.    -Sexuality: Discussed sexually transmitted diseases, partner selection, use of condoms, avoidance of unintended pregnancy and contraceptive alternatives.   -Dental health: Discussed importance of regular tooth brushing, flossing, and dental visits.  -Health maintenance and immunizations reviewed. Please refer to Health maintenance section.  Return to care in 1 year for next preventative visit.     Subjective:  HPI:  He has had several episodes of nocturia recently. Wakes up 2-4 times per night to urinate.   Lifestyle Diet: Balanced. Plenty of fruits and vegetables.  Exercise: Golf.      04/07/2022    9:02 AM  Depression screen PHQ 2/9  Decreased Interest 0  Down, Depressed, Hopeless 0  PHQ - 2 Score 0    Health Maintenance Due  Topic Date Due   Medicare Annual Wellness (AWV)  04/24/2022     ROS: Per HPI, otherwise a complete review of systems was negative.   PMH:  The following were reviewed and entered/updated in epic: Past Medical History:  Diagnosis Date   A-fib ([place])    Acute blood loss anemia 01/26/2015   Allergic rhinitis    Diverticulosis of colon    Dysplastic nevus    GERD (gastroesophageal reflux disease)    GI bleeding 01/24/2015   Hypercholesterolemia    Postural dizziness with near syncope due to dehydration 12/09/2016   RA (rheumatoid arthritis) [facility])    Patient Active Problem List   Diagnosis Date Noted   Lower urinary tract symptoms (LUTS) 04/07/2022   TB lung, latent 08/21/2021   Hyperglycemia 04/05/2019   Family history of prostate cancer 04/03/2019   GERD (gastroesophageal reflux disease), on Omeprazole 07/01/2018   History of GI bleed 07/01/2018   Insomnia 07/01/2018   Abnormal  CT of the chest 03/03/2018   Sensorineural hearing loss (SNHL), bilateral 12/28/2017   Tinnitus, bilateral 12/28/2017   Pulmonary nodule, left 12/09/2016   Former smoker 01/16/2015   Atrial fibrillation ([place]), on Cardizem daily, with prn ASA and Diltiazem  04/25/2012   Rheumatoid arthritis ([place]), on Enbrel, followed by Dr. Amil Amen 01/06/2011   Allergic rhinitis 04/22/2009   Dyslipidemia 10/24/2008   Diverticulosis of large intestine 06/21/2007   Past Surgical History:  Procedure Laterality Date   COLONOSCOPY  01/24/2015   x 3   dental implant     ERCP N/A 02/18/2015   Procedure: ENDOSCOPIC RETROGRADE CHOLANGIOPANCREATOGRAPHY (ERCP);  Surgeon: Carol Ada, MD;  Location: [facility];  Service: Endoscopy;  Laterality: N/A;   GIVENS CAPSULE STUDY N/A 01/24/2015   Procedure: GIVENS CAPSULE STUDY;  Surgeon: Juanita Craver, MD;  Location: [place];  Service: Endoscopy;  Laterality: N/A;   INGUINAL HERNIA REPAIR Right 07/27/2019   Procedure: LAPAROSCOPIC RIGHT INGUINAL HERNIA REPAIR WITH MESH;  Surgeon: Ralene Ok, MD;  Location: [place];  Service: General;  Laterality: Right;   LAPAROSCOPIC CHOLECYSTECTOMY  2011   TOE AMPUTATION  1966   TONSILLECTOMY AND ADENOIDECTOMY      Family History  Problem Relation Age of Onset   Heart disease Mother    Rheum arthritis Mother    Prostate cancer Father    Colon cancer Neg Hx    Esophageal cancer Neg Hx    Rectal cancer Neg Hx    Stomach cancer Neg Hx     Medications- reviewed and updated Current Outpatient Medications  Medication Sig Dispense Refill   acetaminophen (TYLENOL) 160 MG/5ML liquid Take 325 mg by mouth every 4 (four) hours as needed for pain.     aspirin EC 81 MG tablet Take 81 mg by mouth daily as needed (afib).     azelastine (ASTELIN) 0.1 % nasal spray Place 2 sprays into both nostrils 2 (two) times daily. 30 mL 12   clotrimazole (LOTRIMIN) 1 % cream Apply 1 application topically daily as needed (jock itch).     diltiazem (CARDIZEM CD) 120 MG 24 hr capsule Take 1 capsule (120 mg total) by mouth daily. 90 capsule 3   docusate sodium (COLACE) 100 MG capsule Take 100 mg by mouth as needed.      etanercept (ENBREL) 50 MG/ML injection Inject 50 mg into the skin every Monday.       ibuprofen (ADVIL,MOTRIN) 200 MG tablet Take 600 mg by mouth every 8 (eight) hours as needed for moderate pain.      LORazepam (ATIVAN) 1 MG tablet TAKE 1 TABLET BY MOUTH AT BEDTIME 30 tablet 0   melatonin 5 MG TABS Take 5 mg by mouth.     Multiple Vitamin (MULTIVITAMIN) tablet Take 1 tablet by mouth daily.     neomycin-bacitracin-polymyxin (NEOSPORIN) ointment Apply 1 application topically as needed for wound care.     omeprazole (PRILOSEC) 40 MG capsule TAKE 1 CAPSULE BY MOUTH IN THE EVENING 90 capsule 1   Probiotic Product (PROBIOTIC-10 ULTIMATE) CAPS Take by mouth.     vitamin C (ASCORBIC ACID) 500 MG tablet Take 500 mg by mouth daily.      No current facility-administered medications for this visit.    Allergies-reviewed and updated Allergies  Allergen Reactions   Caffeine Other (See Comments)    Causes him to go into afib   Flecainide Other (See Comments)    Caused heart to race too fast and had him in the ED  Methotrexate Derivatives Nausea And Vomiting    Increased liver enzymes   Pseudoephedrine-Ibuprofen Other (See Comments)    " afib"    Penicillins Rash    Did it involve swelling of the face/tongue/throat, SOB, or low BP? No Did it involve sudden or severe rash/hives, skin peeling, or any reaction on the inside of your mouth or nose? No Did you need to seek medical attention at a hospital or doctor's office? No When did it last happen?      40 + years If all above answers are "NO", may proceed with cephalosporin use.     Social History   Socioeconomic History   Marital status: Married    Spouse name: Jeani Hawking   Number of children: 2   Years of education: Not on file   Highest education level: Not on file  Occupational History   Occupation: [employer]    Comment: RETIRED  Tobacco Use   Smoking status: Former    Packs/day: 0.50    Years: 45.00    Total pack years: 22.50    Types: Cigarettes    Quit date: 11/13/2009    Years since quitting: 12.4   Smokeless  tobacco: Never  Vaping Use   Vaping Use: Never used  Substance and Sexual Activity   Alcohol use: No    Alcohol/week: 0.0 standard drinks of alcohol   Drug use: No   Sexual activity: Never  Other Topics Concern   Not on file  Social History Narrative   One son with HBP. One son with IDDM. Exercises regularly at the [facility]. Eats well. Down 30 pounds with the lifestyle change. Exercise keeps him from feeling so stiff. Weighs daily. Baseline 199-204.    Social Determinants of Health   Financial Resource Strain: Low Risk  (04/24/2021)   Overall Financial Resource Strain (CARDIA)    Difficulty of Paying Living Expenses: Not hard at all  Food Insecurity: No Food Insecurity (04/24/2021)   Hunger Vital Sign    Worried About Running Out of Food in the Last Year: Never true    Ran Out of Food in the Last Year: Never true  Transportation Needs: No Transportation Needs (04/24/2021)   PRAPARE - Hydrologist (Medical): No    Lack of Transportation (Non-Medical): No  Physical Activity: Sufficiently Active (04/24/2021)   Exercise Vital Sign    Days of Exercise per Week: 5 days    Minutes of Exercise per Session: 60 min  Stress: No Stress Concern Present (04/24/2021)   [place]    Feeling of Stress : Not at all  Social Connections: [place] (04/24/2021)   Social Connection and Isolation Panel [NHANES]    Frequency of Communication with Friends and Family: More than three times a week    Frequency of Social Gatherings with Friends and Family: More than three times a week    Attends Religious Services: More than 4 times per year    Active Member of [employer] or Organizations: Yes    Attends Archivist Meetings: 1 to 4 times per year    Marital Status: Married        Objective:  Physical Exam: BP 116/71   Pulse (!) 59   Temp (!) 97.3 F (36.3 C) (Temporal)   Ht 6' (1.829 m)   Wt 205  lb 3.2 oz (93.1 kg)   SpO2 97%   BMI 27.83 kg/m   Body mass  index is 27.83 kg/m. Wt Readings from Last 3 Encounters:  04/07/22 205 lb 3.2 oz (93.1 kg)  03/25/22 208 lb (94.3 kg)  03/05/22 206 lb 9.6 oz (93.7 kg)   Gen: NAD, resting comfortably HEENT: TMs normal bilaterally. OP clear. No thyromegaly noted.  CV: RRR with no murmurs appreciated Pulm: NWOB, CTAB with no crackles, wheezes, or rhonchi GI: Normal bowel sounds present. Soft, Nontender, Nondistended. GU: Mild prostatic enlargement noted. No nodules MSK: no edema, cyanosis, or clubbing noted Skin: warm, dry Neuro: CN2-12 grossly intact. Strength 5/5 in upper and lower extremities. Reflexes symmetric and intact bilaterally.  Psych: Normal affect and thought content  Time Spent: 45 minutes of total time was spent on the date of the encounter performing the following actions: chart review prior to seeing the patient including reent visits with specialists, obtaining history, performing a medically necessary exam, counseling on the treatment plan, placing orders, and documenting in our EHR.       Algis Greenhouse. [name], MD 04/07/2022 9:53 AM

## 2022-04-07 NOTE — Assessment & Plan Note (Signed)
Continue management per rheumatology.  He is on Enbrel.

## 2022-04-07 NOTE — Assessment & Plan Note (Signed)
Check A1c. 

## 2022-04-07 NOTE — Assessment & Plan Note (Signed)
Has mild BPH.  Check PSA today.  He is not interested in prescription medications at this point.  We discussed strategies to help minimize symptoms including double voiding and decreasing consumption of fluids late in the evening.

## 2022-04-07 NOTE — Assessment & Plan Note (Signed)
Check PSA.  No nodules on prostate exam

## 2022-04-07 NOTE — Patient Instructions (Addendum)
It was very nice to see you today!  You have a mildly enlarged prostate. We will check blood work today.   Come back in a year in your next physical. Come back sooner if needed.   Take care, Dr [name]  PLEASE NOTE:  If you had any lab tests please let us know if you have not heard back within a few days. You may see your results on mychart before we have a chance to review them but we will give you a call once they are reviewed by [place]. If we ordered any referrals today, please let us know if you have not heard from their office within the next week.   Please try these tips to maintain a healthy lifestyle:  Eat at least 3 REAL meals and 1-2 snacks per day.  Aim for no more than 5 hours between eating.  If you eat breakfast, please do so within one hour of getting up.   Each meal should contain half fruits/vegetables, one quarter protein, and one quarter carbs (no bigger than a computer mouse)  Cut down on sweet beverages. This includes juice, soda, and sweet tea.   Drink at least 1 glass of water with each meal and aim for at least 8 glasses per day  Exercise at least 150 minutes every week.    Preventive Care 34 Years and Older, Male Preventive care refers to lifestyle choices and visits with your health care provider that can promote health and wellness. Preventive care visits are also called wellness exams. What can I expect for my preventive care visit? Counseling During your preventive care visit, your health care provider may ask about your: Medical history, including: Past medical problems. Family medical history. History of falls. Current health, including: Emotional well-being. Home life and relationship well-being. Sexual activity. Memory and ability to understand (cognition). Lifestyle, including: Alcohol, nicotine or tobacco, and drug use. Access to firearms. Diet, exercise, and sleep habits. Work and work Statistician. Sunscreen use. Safety issues such as  seatbelt and bike helmet use. Physical exam Your health care provider will check your: Height and weight. These may be used to calculate your BMI (body mass index). BMI is a measurement that tells if you are at a healthy weight. Waist circumference. This measures the distance around your waistline. This measurement also tells if you are at a healthy weight and may help predict your risk of certain diseases, such as type 2 diabetes and high blood pressure. Heart rate and blood pressure. Body temperature. Skin for abnormal spots. What immunizations do I need?  Vaccines are usually given at various ages, according to a schedule. Your health care provider will recommend vaccines for you based on your age, medical history, and lifestyle or other factors, such as travel or where you work. What tests do I need? Screening Your health care provider may recommend screening tests for certain conditions. This may include: Lipid and cholesterol levels. Diabetes screening. This is done by checking your blood sugar (glucose) after you have not eaten for a while (fasting). Hepatitis C test. Hepatitis B test. HIV (human immunodeficiency virus) test. STI (sexually transmitted infection) testing, if you are at risk. Lung cancer screening. Colorectal cancer screening. Prostate cancer screening. Abdominal aortic aneurysm (AAA) screening. You may need this if you are a current or former smoker. Talk with your health care provider about your test results, treatment options, and if necessary, the need for more tests. Follow these instructions at home: Eating and drinking  Eat  a diet that includes fresh fruits and vegetables, whole grains, lean protein, and low-fat dairy products. Limit your intake of foods with high amounts of sugar, saturated fats, and salt. Take vitamin and mineral supplements as recommended by your health care provider. Do not drink alcohol if your health care provider tells you not to  drink. If you drink alcohol: Limit how much you have to 0-2 drinks a day. Know how much alcohol is in your drink. In the [place] one drink equals one 12 oz bottle of beer (355 mL), one 5 oz glass of wine (148 mL), or one 1 oz glass of hard liquor (44 mL). Lifestyle Brush your teeth every morning and night with fluoride toothpaste. Floss one time each day. Exercise for at least 30 minutes 5 or more days each week. Do not use any products that contain nicotine or tobacco. These products include cigarettes, chewing tobacco, and vaping devices, such as e-cigarettes. If you need help quitting, ask your health care provider. Do not use drugs. If you are sexually active, practice safe sex. Use a condom or other form of protection to prevent STIs. Take aspirin only as told by your health care provider. Make sure that you understand how much to take and what form to take. Work with your health care provider to find out whether it is safe and beneficial for you to take aspirin daily. Ask your health care provider if you need to take a cholesterol-lowering medicine (statin). Find healthy ways to manage stress, such as: Meditation, yoga, or listening to music. Journaling. Talking to a trusted person. Spending time with friends and family. Safety Always wear your seat belt while driving or riding in a vehicle. Do not drive: If you have been drinking alcohol. Do not ride with someone who has been drinking. When you are tired or distracted. While texting. If you have been using any mind-altering substances or drugs. Wear a helmet and other protective equipment during sports activities. If you have firearms in your house, make sure you follow all gun safety procedures. Minimize exposure to UV radiation to reduce your risk of skin cancer. What's next? Visit your health care provider once a year for an annual wellness visit. Ask your health care provider how often you should have your eyes and teeth  checked. Stay up to date on all vaccines. This information is not intended to replace advice given to you by your health care provider. Make sure you discuss any questions you have with your health care provider. Document Revised: 11/05/2020 Document Reviewed: 11/05/2020 Elsevier Patient Education  [place].

## 2022-04-07 NOTE — Assessment & Plan Note (Signed)
On Prilosec per GI.  Check B12.

## 2022-04-07 NOTE — Assessment & Plan Note (Signed)
Check labs 

## 2022-04-07 NOTE — Assessment & Plan Note (Signed)
Probably related to his nocturia as well.  He can take melatonin as needed.

## 2022-04-07 NOTE — Assessment & Plan Note (Signed)
Follows with cardiology.  Peers to be in regular rate and rhythm today.  On aspirin and Cardizem.

## 2022-04-09 ENCOUNTER — Encounter: Payer: Self-pay | Admitting: Family Medicine

## 2022-04-09 DIAGNOSIS — E538 Deficiency of other specified B group vitamins: Secondary | ICD-10-CM | POA: Insufficient documentation

## 2022-04-09 LAB — URINE CULTURE
MICRO NUMBER:: 14193074
Result:: NO GROWTH
SPECIMEN QUALITY:: ADEQUATE

## 2022-04-09 NOTE — Progress Notes (Signed)
Please inform patient of the following:  His B12 is on the low side of normal but everything else is normal.  It is possible that the B12 could be causing some of his fatigue issues.  Recommend starting B12 supplement 1000 mcg daily.  We can recheck this again in 6 to 12 months.  His cholesterol, blood sugar and PSA were all normal.  He should continue the great work with diet and exercise and we can recheck this in a year.

## 2022-04-18 ENCOUNTER — Other Ambulatory Visit: Payer: Self-pay | Admitting: Internal Medicine

## 2022-04-30 ENCOUNTER — Ambulatory Visit (INDEPENDENT_AMBULATORY_CARE_PROVIDER_SITE_OTHER): Payer: Medicare Other

## 2022-04-30 VITALS — Wt 205.0 lb

## 2022-04-30 DIAGNOSIS — Z0000 Encounter for general adult medical examination without abnormal findings: Secondary | ICD-10-CM

## 2022-04-30 NOTE — Progress Notes (Signed)
I connected with  William West on 04/30/22 by a audio enabled telemedicine application and verified that I am speaking with the correct person using two identifiers.  Patient Location: Home  Provider Location: Office/Clinic  I discussed the limitations of evaluation and management by telemedicine. The patient expressed understanding and agreed to proceed.   Subjective:   William West is a 71 y.o. male who presents for Medicare Annual/Subsequent preventive examination.  Review of Systems     Cardiac Risk Factors include: advanced age (>68mn, >>51women);dyslipidemia;male gender     Objective:    Today's Vitals   04/30/22 0752  Weight: 205 lb (93 kg)   Body mass index is 27.8 kg/m.     04/30/2022    7:59 AM 04/24/2021    3:21 PM 11/22/2020    8:01 PM 09/28/2019   11:11 AM 09/20/2019    5:31 PM 07/27/2019    7:44 AM 07/24/2019    1:37 PM  Advanced Directives  Does Patient Have a Medical Advance Directive? Yes Yes No No Yes Yes Yes  Type of [place] Healthcare Power of [place] will Living will Living will  Does patient want to make changes to medical advance directive?      No - Patient declined No - Guardian declined  Copy of [place]? No - copy requested No - copy requested  No - copy requested     Would patient like information on creating a medical advance directive?   No - Patient declined No - Patient declined       Current Medications (verified) Outpatient Encounter Medications as of 04/30/2022  Medication Sig   acetaminophen (TYLENOL) 160 MG/5ML liquid Take 325 mg by mouth every 4 (four) hours as needed for pain.   aspirin EC 81 MG tablet Take 81 mg by mouth daily as needed (afib).   clotrimazole (LOTRIMIN) 1 % cream Apply 1 application topically daily as needed (jock itch).   Cyanocobalamin (B-12 PO) Take by mouth.    diltiazem (CARDIZEM CD) 120 MG 24 hr capsule Take 1 capsule by mouth once daily   docusate sodium (COLACE) 100 MG capsule Take 100 mg by mouth as needed.    etanercept (ENBREL) 50 MG/ML injection Inject 50 mg into the skin every Monday.    ibuprofen (ADVIL,MOTRIN) 200 MG tablet Take 600 mg by mouth every 8 (eight) hours as needed for moderate pain.    LORazepam (ATIVAN) 1 MG tablet TAKE 1 TABLET BY MOUTH AT BEDTIME (Patient taking differently: as needed.)   melatonin 5 MG TABS Take 5 mg by mouth.   Multiple Vitamin (MULTIVITAMIN) tablet Take 1 tablet by mouth daily.   neomycin-bacitracin-polymyxin (NEOSPORIN) ointment Apply 1 application topically as needed for wound care.   omeprazole (PRILOSEC) 40 MG capsule TAKE 1 CAPSULE BY MOUTH IN THE EVENING   Probiotic Product (PROBIOTIC-10 ULTIMATE) CAPS Take by mouth as needed.   vitamin C (ASCORBIC ACID) 500 MG tablet Take 500 mg by mouth daily.    azelastine (ASTELIN) 0.1 % nasal spray Place 2 sprays into both nostrils 2 (two) times daily. (Patient not taking: Reported on 04/30/2022)   No facility-administered encounter medications on file as of 04/30/2022.    Allergies (verified) Caffeine, Flecainide, Methotrexate derivatives, Pseudoephedrine-ibuprofen, and Penicillins   History: Past Medical History:  Diagnosis Date   A-fib ([place]    Acute blood loss anemia 01/26/2015  Allergic rhinitis    Diverticulosis of colon    Dysplastic nevus    GERD (gastroesophageal reflux disease)    GI bleeding 01/24/2015   Hypercholesterolemia    Postural dizziness with near syncope due to dehydration 12/09/2016   RA (rheumatoid arthritis) [facility])    Past Surgical History:  Procedure Laterality Date   COLONOSCOPY  01/24/2015   x 3   dental implant     ERCP N/A 02/18/2015   Procedure: ENDOSCOPIC RETROGRADE CHOLANGIOPANCREATOGRAPHY (ERCP);  Surgeon: Carol Ada, MD;  Location: [facility];  Service: Endoscopy;  Laterality: N/A;   GIVENS CAPSULE STUDY N/A  01/24/2015   Procedure: GIVENS CAPSULE STUDY;  Surgeon: Juanita Craver, MD;  Location: [place];  Service: Endoscopy;  Laterality: N/A;   INGUINAL HERNIA REPAIR Right 07/27/2019   Procedure: LAPAROSCOPIC RIGHT INGUINAL HERNIA REPAIR WITH MESH;  Surgeon: Ralene Ok, MD;  Location: [place];  Service: General;  Laterality: Right;   LAPAROSCOPIC CHOLECYSTECTOMY  2011   TOE AMPUTATION  1966   TONSILLECTOMY AND ADENOIDECTOMY     Family History  Problem Relation Age of Onset   Heart disease Mother    Rheum arthritis Mother    Prostate cancer Father    Colon cancer Neg Hx    Esophageal cancer Neg Hx    Rectal cancer Neg Hx    Stomach cancer Neg Hx    Social History   Socioeconomic History   Marital status: Married    Spouse name: William West   Number of children: 2   Years of education: Not on file   Highest education level: Not on file  Occupational History   Occupation: [employer]    Comment: RETIRED  Tobacco Use   Smoking status: Former    Packs/day: 0.50    Years: 45.00    Total pack years: 22.50    Types: Cigarettes    Quit date: 11/13/2009    Years since quitting: 12.4   Smokeless tobacco: Never  Vaping Use   Vaping Use: Never used  Substance and Sexual Activity   Alcohol use: No    Alcohol/week: 0.0 standard drinks of alcohol   Drug use: No   Sexual activity: Never  Other Topics Concern   Not on file  Social History Narrative   One son with HBP. One son with IDDM. Exercises regularly at the [facility]. Eats well. Down 30 pounds with the lifestyle change. Exercise keeps him from feeling so stiff. Weighs daily. Baseline 199-204.    Social Determinants of Health   Financial Resource Strain: Low Risk  (04/30/2022)   Overall Financial Resource Strain (CARDIA)    Difficulty of Paying Living Expenses: Not hard at all  Food Insecurity: No Food Insecurity (04/30/2022)   Hunger Vital Sign    Worried About Running Out of Food in the Last Year: Never true    Ran Out of Food in the Last  Year: Never true  Transportation Needs: No Transportation Needs (04/30/2022)   PRAPARE - Hydrologist (Medical): No    Lack of Transportation (Non-Medical): No  Physical Activity: Sufficiently Active (04/30/2022)   Exercise Vital Sign    Days of Exercise per Week: 4 days    Minutes of Exercise per Session: 60 min  Stress: No Stress Concern Present (04/30/2022)   [place]    Feeling of Stress : Not at all  Social Connections: [place] (04/30/2022)   Social Connection and Isolation Panel [  NHANES]    Frequency of Communication with Friends and Family: More than three times a week    Frequency of Social Gatherings with Friends and Family: More than three times a week    Attends Religious Services: More than 4 times per year    Active Member of [employer] or Organizations: Yes    Attends Archivist Meetings: 1 to 4 times per year    Marital Status: Married    Tobacco Counseling Counseling given: Not Answered   Clinical Intake:  Pre-visit preparation completed: Yes  Pain : No/denies pain     BMI - recorded: 27.8 Nutritional Status: BMI 25 -29 Overweight Nutritional Risks: None Diabetes: No  How often do you need to have someone help you when you read instructions, pamphlets, or other written materials from your doctor or pharmacy?: 1 - Never  Diabetic?no  Interpreter Needed?: No  Information entered by :: Charlott Rakes, LPN   Activities of Daily Living    04/30/2022    8:00 AM  In your present state of health, do you have any difficulty performing the following activities:  Hearing? 1  Comment HOH  Vision? 0  Difficulty concentrating or making decisions? 0  Walking or climbing stairs? 0  Dressing or bathing? 0  Doing errands, shopping? 0  Preparing Food and eating ? N  Using the Toilet? N  In the past six months, have you accidently leaked urine? N  Do  you have problems with loss of bowel control? N  Managing your Medications? N  Managing your Finances? N  Housekeeping or managing your Housekeeping? N    Patient Care Team: [name], MD as PCP - General (Family Medicine) Hennie Duos, MD as Consulting Physician (Rheumatology) Juanita Craver, MD as Consulting Physician (Gastroenterology) Madelin Rear, [facility] (Inactive) as Pharmacist (Pharmacist)  Indicate any recent Medical Services you may have received from other than [facility] providers in the past year (date may be approximate).     Assessment:   This is a routine wellness examination for Yann.  Hearing/Vision screen Hearing Screening - Comments:: Pt stated HOH  Vision Screening - Comments:: Pt follows up with Dr Jorja Loa For annual eye exams   Dietary issues and exercise activities discussed: Current Exercise Habits: Home exercise routine, Type of exercise: walking;strength training/weights, Time (Minutes): 60, Frequency (Times/Week): 4, Weekly Exercise (Minutes/Week): 240   Goals Addressed             This Visit's Progress    Patient Stated       Stay active and healthy        Depression Screen    04/30/2022    7:57 AM 04/07/2022    9:02 AM 04/24/2021    3:19 PM 04/06/2021    8:36 AM 04/03/2020    8:30 AM 04/03/2019   10:21 AM 06/26/2018    8:39 AM  PHQ 2/9 Scores  PHQ - 2 Score 0 0 0 0 0 0 0  PHQ- 9 Score      1     Fall Risk    04/30/2022    8:00 AM 04/07/2022    9:02 AM 03/05/2022    9:37 AM 04/24/2021    3:23 PM 04/06/2021    8:36 AM  Fall Risk   Falls in the past year? 0 0 0 0 0  Number falls in past yr: 0 0 0 0 0  Injury with Fall? 0 0 0 0 0  Risk for fall due to :  Impaired vision No Fall Risks No Fall Risks Impaired vision No Fall Risks  Follow up Falls prevention discussed  Falls evaluation completed Falls prevention discussed     FALL RISK PREVENTION PERTAINING TO THE HOME:  Any stairs in or around the home? Yes  If so, are there any  without handrails? Yes  Home free of loose throw rugs in walkways, pet beds, electrical cords, etc? Yes  Adequate lighting in your home to reduce risk of falls? Yes   ASSISTIVE DEVICES UTILIZED TO PREVENT FALLS:  Life alert? No  Use of a cane, walker or w/c? No  Grab bars in the bathroom? No  Shower chair or bench in shower? No  Elevated toilet seat or a handicapped toilet? No   TIMED UP AND GO:  Was the test performed? No .   Cognitive Function:        04/30/2022    8:01 AM 04/24/2021    3:26 PM  6CIT Screen  What Year? 0 points 0 points  What month? 0 points 0 points  What time? 0 points 0 points  Count back from 20 0 points 0 points  Months in reverse 0 points 0 points  Repeat phrase 0 points 0 points  Total Score 0 points 0 points    Immunizations Immunization History  Administered Date(s) Administered   Fluad Quad(high Dose 65+) 02/11/2020, 02/09/2021, 01/29/2022   Influenza Split 02/22/2011, 03/14/2012, 03/16/2013, 02/08/2014, 02/22/2015   Influenza, High Dose Seasonal PF 02/21/2017, 03/02/2018, 01/17/2019   Influenza,inj,Quad PF,6+ Mos 01/21/2016   Influenza,inj,Quad PF,6-35 Mos 12/09/2020   PFIZER(Purple Top)SARS-COV-2 Vaccination 06/28/2019, 07/19/2019, 01/07/2020   Pfizer Covid-19 Vaccine Bivalent Booster 71yr & up 03/09/2021, 02/24/2022   Pneumococcal Conjugate-13 08/22/2017   Pneumococcal Polysaccharide-23 10/24/2008, 01/26/2019   RSV,unspecified 04/12/2022   Td 09/21/2001, 10/24/2008   Tdap 03/07/2014, 03/30/2018   Zoster Recombinat (Shingrix) 03/13/2020, 06/23/2021    TDAP status: Up to date  Flu Vaccine status: Up to date  Pneumococcal vaccine status: Up to date  Covid-19 vaccine status: Completed vaccines  Qualifies for Shingles Vaccine? Yes   Zostavax completed Yes   Shingrix Completed?: Yes  Screening Tests Health Maintenance  Topic Date Due   COVID-19 Vaccine (6 - 2023-24 season) 04/21/2022   Lung Cancer Screening  09/19/2022    Medicare Annual Wellness (AWV)  05/01/2023   DTaP/Tdap/Td (5 - Td or Tdap) 03/30/2028   COLONOSCOPY (Pts 45-446yrInsurance coverage will need to be confirmed)  07/23/2028   Pneumonia Vaccine 6573Years old  Completed   INFLUENZA VACCINE  Completed   Hepatitis C Screening  Completed   Zoster Vaccines- Shingrix  Completed   HPV VACCINES  Aged Out    Health Maintenance  Health Maintenance Due  Topic Date Due   COVID-19 Vaccine (6 - 2023-24 season) 04/21/2022    Colorectal cancer screening: Type of screening: Colonoscopy. Completed 07/23/21. Repeat every 7 years  Additional Screening:  Hepatitis C Screening:  Completed 04/03/19  Vision Screening: Recommended annual ophthalmology exams for early detection of glaucoma and other disorders of the eye. Is the patient up to date with their annual eye exam?  Yes  Who is the provider or what is the name of the office in which the patient attends annual eye exams? Dr CoJorja LoaIf pt is not established with a provider, would they like to be referred to a provider to establish care? No .   Dental Screening: Recommended annual dental exams for proper oral hygiene  Community Resource Referral /  Chronic Care Management: CRR required this visit?  No   CCM required this visit?  No      Plan:     I have personally reviewed and noted the following in the patient's chart:   Medical and social history Use of alcohol, tobacco or illicit drugs  Current medications and supplements including opioid prescriptions. Patient is not currently taking opioid prescriptions. Functional ability and status Nutritional status Physical activity Advanced directives List of other physicians Hospitalizations, surgeries, and ER visits in previous 12 months Vitals Screenings to include cognitive, depression, and falls Referrals and appointments  In addition, I have reviewed and discussed with patient certain preventive protocols, quality metrics, and best  practice recommendations. A written personalized care plan for preventive services as well as general preventive health recommendations were provided to patient.     Willette Brace, LPN   [id number]   Nurse Notes: none

## 2022-04-30 NOTE — Patient Instructions (Signed)
William West , Thank you for taking time to come for your Medicare Wellness Visit. I appreciate your ongoing commitment to your health goals. Please review the following plan we discussed and let me know if I can assist you in the future.   These are the goals we discussed:  Goals      Patient Stated     Continue to stay healthy and stay active      Patient Stated     Stay active and healthy         This is a list of the screening recommended for you and due dates:  Health Maintenance  Topic Date Due   COVID-19 Vaccine (6 - 2023-24 season) 04/21/2022   Screening for Lung Cancer  09/19/2022   Medicare Annual Wellness Visit  05/01/2023   DTaP/Tdap/Td vaccine (5 - Td or Tdap) 03/30/2028   Colon Cancer Screening  07/23/2028   Pneumonia Vaccine  Completed   Flu Shot  Completed   Hepatitis C Screening: USPSTF Recommendation to screen - Ages 18-79 yo.  Completed   Zoster (Shingles) Vaccine  Completed   HPV Vaccine  Aged Out    Advanced directives: Please bring a copy of your health care power of attorney and living will to the office at your convenience.  Conditions/risks identified: stay healthy and active   Next appointment: Follow up in one year for your annual wellness visit.   Preventive Care 70 Years and Older, Male  Preventive care refers to lifestyle choices and visits with your health care provider that can promote health and wellness. What does preventive care include? A yearly physical exam. This is also called an annual well check. Dental exams once or twice a year. Routine eye exams. Ask your health care provider how often you should have your eyes checked. Personal lifestyle choices, including: Daily care of your teeth and gums. Regular physical activity. Eating a healthy diet. Avoiding tobacco and drug use. Limiting alcohol use. Practicing safe sex. Taking low doses of aspirin every day. Taking vitamin and mineral supplements as recommended by your health  care provider. What happens during an annual well check? The services and screenings done by your health care provider during your annual well check will depend on your age, overall health, lifestyle risk factors, and family history of disease. Counseling  Your health care provider may ask you questions about your: Alcohol use. Tobacco use. Drug use. Emotional well-being. Home and relationship well-being. Sexual activity. Eating habits. History of falls. Memory and ability to understand (cognition). Work and work Statistician. Screening  You may have the following tests or measurements: Height, weight, and BMI. Blood pressure. Lipid and cholesterol levels. These may be checked every 5 years, or more frequently if you are over 62 years old. Skin check. Lung cancer screening. You may have this screening every year starting at age 51 if you have a 30-pack-year history of smoking and currently smoke or have quit within the past 15 years. Fecal occult blood test (FOBT) of the stool. You may have this test every year starting at age 83. Flexible sigmoidoscopy or colonoscopy. You may have a sigmoidoscopy every 5 years or a colonoscopy every 10 years starting at age 27. Prostate cancer screening. Recommendations will vary depending on your family history and other risks. Hepatitis C blood test. Hepatitis B blood test. Sexually transmitted disease (STD) testing. Diabetes screening. This is done by checking your blood sugar (glucose) after you have not eaten for a while (fasting). You  may have this done every 1-3 years. Abdominal aortic aneurysm (AAA) screening. You may need this if you are a current or former smoker. Osteoporosis. You may be screened starting at age 66 if you are at high risk. Talk with your health care provider about your test results, treatment options, and if necessary, the need for more tests. Vaccines  Your health care provider may recommend certain vaccines, such  as: Influenza vaccine. This is recommended every year. Tetanus, diphtheria, and acellular pertussis (Tdap, Td) vaccine. You may need a Td booster every 10 years. Zoster vaccine. You may need this after age 43. Pneumococcal 13-valent conjugate (PCV13) vaccine. One dose is recommended after age 61. Pneumococcal polysaccharide (PPSV23) vaccine. One dose is recommended after age 72. Talk to your health care provider about which screenings and vaccines you need and how often you need them. This information is not intended to replace advice given to you by your health care provider. Make sure you discuss any questions you have with your health care provider. Document Released: 06/06/2015 Document Revised: 01/28/2016 Document Reviewed: 03/11/2015 Elsevier Interactive Patient Education  2017 [place] Prevention in the Home Falls can cause injuries. They can happen to people of all ages. There are many things you can do to make your home safe and to help prevent falls. What can I do on the outside of my home? Regularly fix the edges of walkways and driveways and fix any cracks. Remove anything that might make you trip as you walk through a door, such as a raised step or threshold. Trim any bushes or trees on the path to your home. Use bright outdoor lighting. Clear any walking paths of anything that might make someone trip, such as rocks or tools. Regularly check to see if handrails are loose or broken. Make sure that both sides of any steps have handrails. Any raised decks and porches should have guardrails on the edges. Have any leaves, snow, or ice cleared regularly. Use sand or salt on walking paths during winter. Clean up any spills in your garage right away. This includes oil or grease spills. What can I do in the bathroom? Use night lights. Install grab bars by the toilet and in the tub and shower. Do not use towel bars as grab bars. Use non-skid mats or decals in the tub or  shower. If you need to sit down in the shower, use a plastic, non-slip stool. Keep the floor dry. Clean up any water that spills on the floor as soon as it happens. Remove soap buildup in the tub or shower regularly. Attach bath mats securely with double-sided non-slip rug tape. Do not have throw rugs and other things on the floor that can make you trip. What can I do in the bedroom? Use night lights. Make sure that you have a light by your bed that is easy to reach. Do not use any sheets or blankets that are too big for your bed. They should not hang down onto the floor. Have a firm chair that has side arms. You can use this for support while you get dressed. Do not have throw rugs and other things on the floor that can make you trip. What can I do in the kitchen? Clean up any spills right away. Avoid walking on wet floors. Keep items that you use a lot in easy-to-reach places. If you need to reach something above you, use a strong step stool that has a grab bar. Keep electrical  cords out of the way. Do not use floor polish or wax that makes floors slippery. If you must use wax, use non-skid floor wax. Do not have throw rugs and other things on the floor that can make you trip. What can I do with my stairs? Do not leave any items on the stairs. Make sure that there are handrails on both sides of the stairs and use them. Fix handrails that are broken or loose. Make sure that handrails are as long as the stairways. Check any carpeting to make sure that it is firmly attached to the stairs. Fix any carpet that is loose or worn. Avoid having throw rugs at the top or bottom of the stairs. If you do have throw rugs, attach them to the floor with carpet tape. Make sure that you have a light switch at the top of the stairs and the bottom of the stairs. If you do not have them, ask someone to add them for you. What else can I do to help prevent falls? Wear shoes that: Do not have high heels. Have  rubber bottoms. Are comfortable and fit you well. Are closed at the toe. Do not wear sandals. If you use a stepladder: Make sure that it is fully opened. Do not climb a closed stepladder. Make sure that both sides of the stepladder are locked into place. Ask someone to hold it for you, if possible. Clearly mark and make sure that you can see: Any grab bars or handrails. First and last steps. Where the edge of each step is. Use tools that help you move around (mobility aids) if they are needed. These include: Canes. Walkers. Scooters. Crutches. Turn on the lights when you go into a dark area. Replace any light bulbs as soon as they burn out. Set up your furniture so you have a clear path. Avoid moving your furniture around. If any of your floors are uneven, fix them. If there are any pets around you, be aware of where they are. Review your medicines with your doctor. Some medicines can make you feel dizzy. This can increase your chance of falling. Ask your doctor what other things that you can do to help prevent falls. This information is not intended to replace advice given to you by your health care provider. Make sure you discuss any questions you have with your health care provider. Document Released: 03/06/2009 Document Revised: 10/16/2015 Document Reviewed: 06/14/2014 Elsevier Interactive Patient Education  2017 [employer].

## 2022-05-26 ENCOUNTER — Other Ambulatory Visit: Payer: Self-pay | Admitting: Internal Medicine

## 2022-05-26 DIAGNOSIS — K219 Gastro-esophageal reflux disease without esophagitis: Secondary | ICD-10-CM

## 2022-08-19 ENCOUNTER — Other Ambulatory Visit: Payer: Self-pay | Admitting: Internal Medicine

## 2022-08-19 DIAGNOSIS — K219 Gastro-esophageal reflux disease without esophagitis: Secondary | ICD-10-CM

## 2022-08-25 ENCOUNTER — Telehealth: Payer: Self-pay | Admitting: Internal Medicine

## 2022-08-25 NOTE — Telephone Encounter (Signed)
Error

## 2022-09-29 ENCOUNTER — Encounter: Payer: Self-pay | Admitting: Family Medicine

## 2022-09-29 ENCOUNTER — Ambulatory Visit (INDEPENDENT_AMBULATORY_CARE_PROVIDER_SITE_OTHER): Payer: Medicare Other | Admitting: Family Medicine

## 2022-09-29 VITALS — BP 110/60 | HR 85 | Temp 98.7°F | Ht 72.0 in | Wt 207.8 lb

## 2022-09-29 DIAGNOSIS — F5101 Primary insomnia: Secondary | ICD-10-CM

## 2022-09-29 DIAGNOSIS — E538 Deficiency of other specified B group vitamins: Secondary | ICD-10-CM

## 2022-09-29 DIAGNOSIS — J329 Chronic sinusitis, unspecified: Secondary | ICD-10-CM

## 2022-09-29 DIAGNOSIS — R519 Headache, unspecified: Secondary | ICD-10-CM

## 2022-09-29 DIAGNOSIS — K219 Gastro-esophageal reflux disease without esophagitis: Secondary | ICD-10-CM

## 2022-09-29 DIAGNOSIS — J301 Allergic rhinitis due to pollen: Secondary | ICD-10-CM

## 2022-09-29 DIAGNOSIS — M069 Rheumatoid arthritis, unspecified: Secondary | ICD-10-CM

## 2022-09-29 LAB — CBC
HCT: 42.7 % (ref 39.0–52.0)
Hemoglobin: 14.8 g/dL (ref 13.0–17.0)
MCHC: 34.6 g/dL (ref 30.0–36.0)
MCV: 85.6 fl (ref 78.0–100.0)
Platelets: 213 10*3/uL (ref 150.0–400.0)
RBC: 4.99 Mil/uL (ref 4.22–5.81)
RDW: 14.2 % (ref 11.5–15.5)
WBC: 10.2 10*3/uL (ref 4.0–10.5)

## 2022-09-29 LAB — C-REACTIVE PROTEIN: CRP: <1 mg/dL (ref 0.5–20.0)

## 2022-09-29 LAB — SEDIMENTATION RATE: Sed Rate: 2 mm/hr (ref 0–20)

## 2022-09-29 LAB — VITAMIN B12: Vitamin B-12: 611 pg/mL (ref 211–911)

## 2022-09-29 MED ORDER — AZELASTINE HCL 0.1 % NA SOLN: 2.0000 | Freq: Two times a day (BID) | NASAL | 12 refills | Status: AC

## 2022-09-29 MED ORDER — TRAZODONE HCL 50 MG PO TABS: 25.0000 mg | ORAL_TABLET | Freq: Every evening | ORAL | 3 refills | Status: DC | PRN

## 2022-09-29 MED ORDER — AZITHROMYCIN 250 MG PO TABS: ORAL_TABLET | ORAL | 0 refills | Status: DC

## 2022-09-29 NOTE — Progress Notes (Signed)
William West is a 72 y.o. male who presents today for an office visit.  Assessment/Plan:  New/Acute Problems: Sinusitis  No red flags. Likely due to viral URI though may be developing a bacterial infection. Will start astelin nasal spray. He can continue OTC medications. Will send in a pocket prescription for azithromycin with instruction to not start unless symptoms worse or do not improve in the next several days. Encouraged hydration. We discussed reasons to return care.   Headache Likely related to above viral URI though due to location will check ESR and CRP to rule out temporal arteritis. He does have an history of rheumatoid arthritis and is on enbrel for this. His neuroexam today is reassuring - do not think we need to get any imaging at this point. He also does have some TMJ discomfort which is also contributing.  We discussed reasons to return to care and seek emergent care.  Would consider empiric trial of prednisone if symptoms persist despite above and his blood work is normal.  Would be cautious with extended course of NSAIDs due to history of GI bleed and cardiac history.  Chronic Problems Addressed Today: Low serum vitamin B12 Doing better on B12 supplementation. Will recheck B12 today. On daily.   GERD (gastroesophageal reflux disease), on Omeprazole On Prilosec 40 mg daily. Likely causing some of his B12 deficiencies.   Insomnia Not controlled. He has not had much benefit with melatonin. We discussed potential treatment options. He is having more sleep maintenance issues. Will start trazodone 25-50 mg nightly. We discussed potential side effects. He can follow up with me in a few weeks.   He also has clonazepam.  He uses this very sparingly as needed for issues with severe insomnia.  He uses this once or twice per month.  Does not need refill today.  Allergic rhinitis Likely contributing to above sinusitis. We will add on astelin. He can continue OTC Flonase  and antihistamine.   Rheumatoid arthritis (HCC), on Enbrel, followed by Dr. Dierdre Forth On Enbrel per rheumatology.     Subjective:  HPI:  See Assessment / plan for status of chronic conditions.  Patient here with cough and congestion. This started about a week ago. He has having some right sided headache as well. He is coughing up some green mucus.  Over the last few days his symptoms have stayed about the same. He is concerned about possible sinus infection. OTC medications only give temporary benefit. He is having a headache on the right side of his head around his ear for the last few days. Sometimes radiates into the top of his head.  No vision changes.  No weakness or numbness.  Pain comes and goes.  Describes a sharp shooting pain.  Still has ongoing issues with insomnia as well.  He has previously been trying melatonin without much benefit.  He has limited caffeine and alcohol intake though still wakes up in the melanite frequently.  Does occasionally use clonazepam as needed though only uses this a couple times per month.  Does not need any refills on this today.       Objective:  Physical Exam: BP 110/60   Pulse 85   Temp 98.7 F (37.1 C) (Temporal)   Ht 6' (1.829 m)   Wt 207 lb 12.8 oz (94.3 kg)   SpO2 98%   BMI 28.18 kg/m   Gen: No acute distress, resting comfortably HEENT: Bilateral TMs with clear effusion.  OP erythematous.  Nose mucosa  erythematous and boggy with clear discharge.  Bilateral TMJ tender to palpation.  Worse with opening closing of jaw.  Right temporal.  Nontender to palpation. CV: Regular rate and rhythm with no murmurs appreciated Pulm: Normal work of breathing, clear to auscultation bilaterally with no crackles, wheezes, or rhonchi Neuro: Cranial nerves II through XII intact.  Strength 5 out of 5 in upper and lower extremities.  Sensation to light touch intact throughout. Psych: Normal affect and thought content      William Palm M. Jimmey Ralph, MD 09/29/2022  10:29 AM

## 2022-09-29 NOTE — Assessment & Plan Note (Signed)
Doing better on B12 supplementation. Will recheck B12 today. On daily.

## 2022-09-29 NOTE — Assessment & Plan Note (Signed)
On Prilosec 40 mg daily. Likely causing some of his B12 deficiencies.

## 2022-09-29 NOTE — Patient Instructions (Signed)
It was very nice to see you today!  I think you probably have an infection in your sinuses. Please start the nasal spray. Started the antibiotic if not improving in a few days.  We will check blood work to make sure you do not have any other sources of inflammation.  We will recheck your B12 today.  Please try the trazodone to help with your sleep. Send me a message in a few weeks to let me know how this is working for you.   Return if symptoms worsen or fail to improve.   Take care, Dr Jimmey Ralph  PLEASE NOTE:  If you had any lab tests, please let us know if you have not heard back within a few days. You may see your results on mychart before we have a chance to review them but we will give you a call once they are reviewed by [place].   If we ordered any referrals today, please let us know if you have not heard from their office within the next week.   If you had any urgent prescriptions sent in today, please check with the pharmacy within an hour of our visit to make sure the prescription was transmitted appropriately.   Please try these tips to maintain a healthy lifestyle:  Eat at least 3 REAL meals and 1-2 snacks per day.  Aim for no more than 5 hours between eating.  If you eat breakfast, please do so within one hour of getting up.   Each meal should contain half fruits/vegetables, one quarter protein, and one quarter carbs (no bigger than a computer mouse)  Cut down on sweet beverages. This includes juice, soda, and sweet tea.   Drink at least 1 glass of water with each meal and aim for at least 8 glasses per day  Exercise at least 150 minutes every week.

## 2022-09-29 NOTE — Assessment & Plan Note (Signed)
Likely contributing to above sinusitis. We will add on astelin. He can continue OTC Flonase and antihistamine.

## 2022-09-29 NOTE — Assessment & Plan Note (Signed)
On Enbrel per rheumatology.  

## 2022-09-29 NOTE — Assessment & Plan Note (Addendum)
Not controlled. He has not had much benefit with melatonin. We discussed potential treatment options. He is having more sleep maintenance issues. Will start trazodone 25-50 mg nightly. We discussed potential side effects. He can follow up with me in a few weeks.   He also has clonazepam.  He uses this very sparingly as needed for issues with severe insomnia.  He uses this once or twice per month.  Does not need refill today.

## 2022-09-30 NOTE — Progress Notes (Signed)
Blood work is all normal.  B12 is better.  He should continue with 1000 mcg daily and we can recheck again in 6 to 12 months.  His inflammatory's are all negative.  I think his headache is probably coming from his sinus infection.  He should let us know if his headache is not improving.

## 2022-10-05 ENCOUNTER — Encounter: Payer: Self-pay | Admitting: Family Medicine

## 2022-10-05 NOTE — Telephone Encounter (Signed)
Please advise 

## 2022-10-05 NOTE — Telephone Encounter (Signed)
I appreciate the update. We can discus more at his upcoming appointment.  Katina Degree. Jimmey Ralph, MD 10/05/2022 1:01 PM

## 2022-10-06 ENCOUNTER — Encounter: Payer: Self-pay | Admitting: Emergency Medicine

## 2022-10-06 ENCOUNTER — Ambulatory Visit: Payer: Medicare Other | Admitting: Emergency Medicine

## 2022-10-06 ENCOUNTER — Ambulatory Visit (INDEPENDENT_AMBULATORY_CARE_PROVIDER_SITE_OTHER): Payer: Medicare Other

## 2022-10-06 VITALS — BP 136/72 | HR 62 | Temp 97.9°F | Ht 72.0 in | Wt 209.0 lb

## 2022-10-06 DIAGNOSIS — R911 Solitary pulmonary nodule: Secondary | ICD-10-CM | POA: Diagnosis not present

## 2022-10-06 DIAGNOSIS — R9389 Abnormal findings on diagnostic imaging of other specified body structures: Secondary | ICD-10-CM | POA: Diagnosis not present

## 2022-10-06 NOTE — Patient Instructions (Signed)
We will perform a chest x-ray today Continue stay active, work on your exercise and conditioning. We will hold off on any inhaled medication at this time.  If your breathing changes or you develop any new respiratory symptoms then we would perform pulmonary function testing and consider medication. Continue to follow with rheumatology as planned Follow Dr. Delton Coombes in 1 year, sooner if you have any problems.

## 2022-10-06 NOTE — Progress Notes (Signed)
   Subjective:    Patient ID: William West, male    DOB: [birth year], 72 y.o.   MRN: [id number]  HPI  ROV 10/06/22 --William West is 8 and has a history of mild COPD, rheumatoid arthritis on immunosuppression (currently on Enbrel), atrial fibrillation.  He has stable pulmonary nodular disease that has been ascribed to his rheumatoid arthritis.  He had some confusing results with negative, then positive then negative QuantiFERON gold in March 2023 (done as surveillance while on Enbrel).  After the most recent negative QuantiFERON gold 07/2021 we decided to defer treatment for possible latent TB. Hasn't been repeated.  He is not on any scheduled bronchodilator therapy. He reports that he had some sinus congestion and drainage, R ear ache. Was started on astelin, azithro. Somewhat better, but still R ear fullness. He has no breathing limitations, goes to the gym 3 days a week. Walks a lot, still plays golf. He does get some R hand stiffness  CT chest 09/18/2021 reviewed by me shows no mediastinal or hilar lymphadenopathy, a stable left lower lobe 8 x 7 mm pulmonary nodule now with some calcification consistent with benign partially calcified granuloma.  No other nodules noted.  Labs: QuantiFERON gold 07/21/2020 >> negative QuantiFERON gold 07/22/2021 >> positive QuantiFERON gold 08/05/2021 >> positive QuantiFERON gold 08/21/2021 >> negative   Review of Systems As per HPI     Objective:   Physical Exam Vitals:   10/06/22 1021  BP: 136/72  Pulse: 62  Temp: 97.9 F (36.6 C)  TempSrc: Oral  SpO2: 99%  Weight: 209 lb (94.8 kg)  Height: 6' (1.829 m)    Gen: Pleasant, well-nourished, in no distress,  normal affect  ENT: No lesions,  mouth clear,  oropharynx clear, no postnasal drip  Neck: No JVD, no stridor  Lungs: No use of accessory muscles, no crackles or wheezing on normal respiration, no wheeze on forced expiration  Cardiovascular: RRR, heart sounds normal, no murmur or gallops, no  peripheral edema  Musculoskeletal: No deformities, no cyanosis or clubbing  Neuro: alert, awake, non focal  Skin: Warm, no lesions or rash      Assessment & Plan:  Abnormal CT of the chest His pulmonary nodules have remained stable over several serial CT scans, likely due to his RA.  There are some calcification present.  No interstitial lung disease from either RA or his past history of methotrexate.  I believe his positive QuantiFERON gold testing in March 2023 was likely false positive.  He had a negative 08/21/2021 and will likely continue to follow-up these tests with rheumatology while he is on Enbrel.  Will check a chest x-ray today, defer further CT chest and this is a clinical change.   Levy Pupa, MD, PhD 10/06/2022, 10:36 AM [facility] [phone] or if no answer [phone]

## 2022-10-06 NOTE — Assessment & Plan Note (Signed)
His pulmonary nodules have remained stable over several serial CT scans, likely due to his RA.  There are some calcification present.  No interstitial lung disease from either RA or his past history of methotrexate.  I believe his positive QuantiFERON gold testing in March 2023 was likely false positive.  He had a negative 08/21/2021 and will likely continue to follow-up these tests with rheumatology while he is on Enbrel.  Will check a chest x-ray today, defer further CT chest and this is a clinical change.

## 2022-10-07 ENCOUNTER — Ambulatory Visit (INDEPENDENT_AMBULATORY_CARE_PROVIDER_SITE_OTHER): Payer: Medicare Other | Admitting: Family Medicine

## 2022-10-07 ENCOUNTER — Encounter: Payer: Self-pay | Admitting: Family Medicine

## 2022-10-07 VITALS — BP 107/74 | HR 98 | Temp 97.1°F | Ht 72.0 in | Wt 209.2 lb

## 2022-10-07 DIAGNOSIS — J301 Allergic rhinitis due to pollen: Secondary | ICD-10-CM | POA: Diagnosis not present

## 2022-10-07 DIAGNOSIS — J329 Chronic sinusitis, unspecified: Secondary | ICD-10-CM

## 2022-10-07 DIAGNOSIS — R519 Headache, unspecified: Secondary | ICD-10-CM

## 2022-10-07 DIAGNOSIS — F5101 Primary insomnia: Secondary | ICD-10-CM | POA: Diagnosis not present

## 2022-10-07 MED ORDER — PREDNISONE 20 MG PO TABS
20.0000 mg | ORAL_TABLET | Freq: Every day | ORAL | 0 refills | Status: DC
Start: 2022-10-07 — End: 2023-04-13

## 2022-10-07 NOTE — Assessment & Plan Note (Signed)
We started him on trazodone last week.  He is doing very well with this.  Taking 25 mg nightly.  No significant side effects.  We will continue this for now.  Discussed reasons to return to care.

## 2022-10-07 NOTE — Assessment & Plan Note (Signed)
Contributing to his sinusitis.  Continue Astelin.  This is working well.  He can continue his over-the-counter Flonase and antihistamine as well.

## 2022-10-07 NOTE — Patient Instructions (Addendum)
It was very nice to see you today!  Please start the prednisone.  Let me know if not improving.   Return if symptoms worsen or fail to improve.   Take care, Dr Jimmey Ralph  PLEASE NOTE:  If you had any lab tests, please let us know if you have not heard back within a few days. You may see your results on mychart before we have a chance to review them but we will give you a call once they are reviewed by [place].   If we ordered any referrals today, please let us know if you have not heard from their office within the next week.   If you had any urgent prescriptions sent in today, please check with the pharmacy within an hour of our visit to make sure the prescription was transmitted appropriately.   Please try these tips to maintain a healthy lifestyle:h  Eat at least 3 REAL meals and 1-2 snacks per day.  Aim for no more than 5 hours between eating.  If you eat breakfast, please do so within one hour of getting up.   Each meal should contain half fruits/vegetables, one quarter protein, and one quarter carbs (no bigger than a computer mouse)  Cut down on sweet beverages. This includes juice, soda, and sweet tea.   Drink at least 1 glass of water with each meal and aim for at least 8 glasses per day  Exercise at least 150 minutes every week.

## 2022-10-07 NOTE — Progress Notes (Signed)
   William West is a 72 y.o. male who presents today for an office visit.  Assessment/Plan:  New/Acute Problems: Sinusitis  Initially did not have much improvement with Astelin and azithromycin but does seem to be improving the last couple of days.  Still has little bit of middle ear effusion and there is mucus congestion as well.  We discussed treatment options.  Given that he is still having some headaches would be reasonable to treat at this point.  Will start prednisone 20 mg daily x 5 days.  He is aware of potential side effects.  He can continue taking Astelin as well.  He will let us know if not improving by next week and would consider referral to ENT versus head CT at that time.  Headache Reassuring exam today.  Labs last week were negative.  Likely related to his above sinusitis though he does have some TMJ discomfort as well.  Will be starting prednisone burst as above to clear out the remainder of his sinus infection.  If symptoms persist would consider CT scan versus referral to ENT as above.  We discussed reasons to return to care and seek emergent care.  Chronic Problems Addressed Today: Allergic rhinitis Contributing to his sinusitis.  Continue Astelin.  This is working well.  He can continue his over-the-counter Flonase and antihistamine as well.  Insomnia We started him on trazodone last week.  He is doing very well with this.  Taking 25 mg nightly.  No significant side effects.  We will continue this for now.  Discussed reasons to return to care.     Subjective:  HPI:  See A/P for status of chronic conditions. We saw him 8 days ago for URI symptoms and headache.  There was concern for possible sinusitis and he was started on Astelin and azithromycin.  Due to age and location of headache we also checked labs including CRP and sed rate to rule out temporal arteritis. These were both negative.  He was consistent with taking his antibiotics nasal spray.  Initially did not  notice much of a difference.  He is scheduled an appointment due to persistence of symptoms however over the last 2 days he has had improvement with both the congestion and the headache.  Still has a little bit of lingering ear pain.  This is still on the right side.  Seems to be a little bit better day by day over the last couple days.  No weakness or numbness.  No vision changes.  No hearing changes.        Objective:  Physical Exam: BP 107/74   Pulse 98   Temp (!) 97.1 F (36.2 C) (Temporal)   Ht 6' (1.829 m)   Wt 209 lb 3.2 oz (94.9 kg)   SpO2 97%   BMI 28.37 kg/m   Gen: No acute distress, resting comfortably HEENT: TMs with bilateral effusion.  Mild erythema.  Nasal mucosa erythematous and clear bilaterally CV: Regular rate and rhythm with no murmurs appreciated Pulm: Normal work of breathing, clear to auscultation bilaterally with no crackles, wheezes, or rhonchi Neuro: CN2-12 intact.  Sensation light touch intact throughout.  Moves all extremities spontaneously. Psych: Normal affect and thought content      Layson Bertsch M. Jimmey Ralph, MD 10/07/2022 8:51 AM

## 2022-11-04 ENCOUNTER — Ambulatory Visit: Payer: Medicare Other | Admitting: Internal Medicine

## 2022-11-04 ENCOUNTER — Encounter: Payer: Self-pay | Admitting: Internal Medicine

## 2022-11-04 VITALS — BP 122/70 | HR 91 | Ht 72.0 in | Wt 207.0 lb

## 2022-11-04 DIAGNOSIS — Z86010 Personal history of colonic polyps: Secondary | ICD-10-CM | POA: Diagnosis not present

## 2022-11-04 DIAGNOSIS — K449 Diaphragmatic hernia without obstruction or gangrene: Secondary | ICD-10-CM | POA: Diagnosis not present

## 2022-11-04 DIAGNOSIS — K219 Gastro-esophageal reflux disease without esophagitis: Secondary | ICD-10-CM | POA: Diagnosis not present

## 2022-11-04 MED ORDER — FAMOTIDINE 20 MG PO TABS: ORAL_TABLET | ORAL | 1 refills | Status: DC

## 2022-11-04 MED ORDER — OMEPRAZOLE 20 MG PO CPDR: 20.0000 mg | DELAYED_RELEASE_CAPSULE | Freq: Every day | ORAL | 3 refills | Status: DC

## 2022-11-04 MED ORDER — FAMOTIDINE 20 MG PO TABS: 20.0000 mg | ORAL_TABLET | Freq: Two times a day (BID) | ORAL | 1 refills | Status: DC | PRN

## 2022-11-04 NOTE — Addendum Note (Signed)
Addended by: Dennison Mascot on: 11/04/2022 02:01 PM   Modules accepted: Orders

## 2022-11-04 NOTE — Progress Notes (Signed)
Chief Complaint: GERD  HPI : 73 year old with history of RA on Enbrel, A-fib, and GERD presents for follow up of GERD  Interval History: He is doing well with the exception of a pulled hamstring when reaching down for a golf ball. He is slowly recovering from this pulled hamstring. His acid reflux is well controlled on omeprazole therapy. Bloating has been better controlled as well. He is having regular BMs. Endorses eating and drinking well. His wife is an excellent cook. Weight has been stable. He does think that he has been far less stressed since he retired.  Wt Readings from Last 3 Encounters:  11/04/22 207 lb (93.9 kg)  10/07/22 209 lb 3.2 oz (94.9 kg)  10/06/22 209 lb (94.8 kg)   Past Medical History:  Diagnosis Date   A-fib (HCC)    Acute blood loss anemia 01/26/2015   Allergic rhinitis    Diverticulosis of colon    Dysplastic nevus    GERD (gastroesophageal reflux disease)    GI bleeding 01/24/2015   Hypercholesterolemia    Postural dizziness with near syncope due to dehydration 12/09/2016   RA (rheumatoid arthritis) [facility])     Past Surgical History:  Procedure Laterality Date   COLONOSCOPY  01/24/2015   x 3   dental implant     ERCP N/A 02/18/2015   Procedure: ENDOSCOPIC RETROGRADE CHOLANGIOPANCREATOGRAPHY (ERCP);  Surgeon: Jeani Hawking, MD;  Location: [facility] ENDOSCOPY;  Service: Endoscopy;  Laterality: N/A;   GIVENS CAPSULE STUDY N/A 01/24/2015   Procedure: GIVENS CAPSULE STUDY;  Surgeon: Charna Elizabeth, MD;  Location: [facility] ENDOSCOPY;  Service: Endoscopy;  Laterality: N/A;   INGUINAL HERNIA REPAIR Right 07/27/2019   Procedure: LAPAROSCOPIC RIGHT INGUINAL HERNIA REPAIR WITH MESH;  Surgeon: Axel Filler, MD;  Location: [facility] OR;  Service: General;  Laterality: Right;   LAPAROSCOPIC CHOLECYSTECTOMY  2011   TOE AMPUTATION  1966   TONSILLECTOMY AND ADENOIDECTOMY     Family History  Problem Relation Age of Onset   Heart disease Mother    Rheum arthritis Mother    Prostate  cancer Father    Colon cancer Neg Hx    Esophageal cancer Neg Hx    Rectal cancer Neg Hx    Stomach cancer Neg Hx    Social History   Tobacco Use   Smoking status: Former    Packs/day: 0.50    Years: 45.00    Additional pack years: 0.00    Total pack years: 22.50    Types: Cigarettes    Quit date: 11/13/2009    Years since quitting: 12.9   Smokeless tobacco: Never  Vaping Use   Vaping Use: Never used  Substance Use Topics   Alcohol use: No    Alcohol/week: 0.0 standard drinks of alcohol   Drug use: No   Current Outpatient Medications  Medication Sig Dispense Refill   acetaminophen (TYLENOL) 160 MG/5ML liquid Take 325 mg by mouth every 4 (four) hours as needed for pain.     Ascorbic Acid (VITAMIN C) 100 MG tablet Take 100 mg by mouth daily.     aspirin EC 81 MG tablet Take 81 mg by mouth daily as needed (afib).     azelastine (ASTELIN) 0.1 % nasal spray Place 2 sprays into both nostrils 2 (two) times daily. 30 mL 12   clotrimazole (LOTRIMIN) 1 % cream Apply 1 application topically daily as needed (jock itch).     Cyanocobalamin (B-12 PO) Take by mouth.     diltiazem (CARDIZEM  CD) 120 MG 24 hr capsule Take 1 capsule by mouth once daily 90 capsule 3   docusate sodium (COLACE) 100 MG capsule Take 100 mg by mouth as needed.      etanercept (ENBREL) 50 MG/ML injection Inject 50 mg into the skin every Monday.      ibuprofen (ADVIL,MOTRIN) 200 MG tablet Take 600 mg by mouth every 8 (eight) hours as needed for moderate pain.      LORazepam (ATIVAN) 1 MG tablet TAKE 1 TABLET BY MOUTH AT BEDTIME (Patient taking differently: as needed.) 30 tablet 0   melatonin 5 MG TABS Take 5 mg by mouth.     Multiple Vitamin (MULTIVITAMIN) tablet Take 1 tablet by mouth daily.     neomycin-bacitracin-polymyxin (NEOSPORIN) ointment Apply 1 application topically as needed for wound care.     omeprazole (PRILOSEC) 40 MG capsule TAKE 1 CAPSULE BY MOUTH IN THE EVENING 90 capsule 0   predniSONE (DELTASONE) 20  MG tablet Take 1 tablet (20 mg total) by mouth daily with breakfast. 5 tablet 0   Probiotic Product (PROBIOTIC-10 ULTIMATE) CAPS Take by mouth as needed.     traZODone (DESYREL) 50 MG tablet Take 0.5-1 tablets (25-50 mg total) by mouth at bedtime as needed for sleep. 30 tablet 3   vitamin C (ASCORBIC ACID) 500 MG tablet Take 500 mg by mouth daily.  (Patient not taking: Reported on 11/04/2022)     No current facility-administered medications for this visit.   Allergies  Allergen Reactions   Caffeine Other (See Comments)    Causes him to go into afib   Flecainide Other (See Comments)    Caused heart to race too fast and had him in the ED   Methotrexate Derivatives Nausea And Vomiting    Increased liver enzymes   Pseudoephedrine-Ibuprofen Other (See Comments)    " afib"    Penicillins Rash    Did it involve swelling of the face/tongue/throat, SOB, or low BP? No Did it involve sudden or severe rash/hives, skin peeling, or any reaction on the inside of your mouth or nose? No Did you need to seek medical attention at a hospital or doctor's office? No When did it last happen?      40 + years If all above answers are "NO", may proceed with cephalosporin use.     Physical Exam: BP 122/70   Pulse 91   Ht 6' (1.829 m)   Wt 207 lb (93.9 kg)   BMI 28.07 kg/m  Constitutional: Pleasant,well-developed, male in no acute distress. HEENT: Normocephalic and atraumatic. Conjunctivae are normal. No scleral icterus. Cardiovascular: Normal rate Pulmonary/chest: Effort normal. No wheezing, rales or rhonchi. Abdominal: Soft, nondistended, nontender Extremities: No edema Neurological: Alert and oriented to person place and time. Skin: Skin is warm and dry. No rashes noted. Psychiatric: Normal mood and affect. Behavior is normal.  Labs 03/2021: CBC and CMP unremarkable.  Labs 03/2022: CBC and CMP nml. Vit D nml. Vit B12 nml. TSH nml. HbA1C 4.8%.   Labs 09/2022: CBC nml. CRP and ESR nml. Vit B12  nml.   CT A/P w/contrast 09/06/17: Abdomen / Pelvis Impression: 1. Pneumobilia within the LEFT hepatic lobe is presumed related to prior sphincterotomy. 2. Sigmoid diverticulosis without diverticulitis. 3. Moderate size fat filled LEFT inguinal hernia.  Colonoscopy 06/08/04: Small internal hemorrhoid. Small sessile polyp biopsied from rectosigmoid colon. Scattered sigmoid diverticulosis. Otherwise normal colonoscopy up to the cecum.  Path: Fragments of benign mucosa.  Colonoscopy 03/25/10:  Path: Fragments of hyperplastic  polyp  EGD 01/24/15:   Colonoscopy 01/24/15:   VCE 01/24/15: Gastric polyps. Normal small bowel mucosa for 1st 4 hours of the study. Very dark stool encountered in the distal small bowel from 4 hours until cecal images at 6 hour 41 minutes. Dark stool in the right colon totally obscuring colon images.   ERCP 02/18/15: IMPRESSIONS: 1) Choledocholithiasis s/p successful stone extraction.  EGD 07/23/21:  Path: 1. Surgical [P], duodenum biopsies - DUODENAL MUCOSA WITH NO SPECIFIC HISTOPATHOLOGIC CHANGES - NEGATIVE FOR INCREASED INTRAEPITHELIAL LYMPHOCYTES OR VILLOUS ARCHITECTURAL CHANGES 2. Surgical [P], gastric biopsies - GASTRIC ANTRAL MUCOSA WITH MILD NONSPECIFIC REACTIVE GASTROPATHY - GASTRIC OXYNTIC MUCOSA WITH PARIETAL CELL HYPERPLASIA AS CAN BE SEEN IN HYPERGASTRINEMIC STATES SUCH AS PPI THERAPY. - HELICOBACTER PYLORI-LIKE ORGANISMS ARE NOT IDENTIFIED ON ROUTINE H&E STAIN 3. Surgical [P], gastric polyp biopsies, polyp (1) - FUNDIC GLAND POLYP(S) - NEGATIVE FOR DYSPLASIA 4. Surgical [P], random esophagus - ESOPHAGEAL SQUAMOUS MUCOSA WITH NO SPECIFIC HISTOPATHOLOGIC CHANGES - NEGATIVE FOR INCREASED INTRAEPITHELIAL EOSINOPHILS  Colonoscopy 07/24/21:  Path: 5. Surgical [P], colon, ascending and cecum, polyp (2) - TUBULAR ADENOMA(S) - NEGATIVE FOR HIGH-GRADE DYSPLASIA OR MALIGNANCY  ASSESSMENT AND PLAN:  GERD Hiatal hernia History of colon polyps Patient's  GERD has been well controlled over time on PPI therapy, though he does bring up some concerns about potential side effects of PPI therapy over the long term. He has already been on PPI for several years. Will try to decrease him to the lowest effective dose of PPI. Asked him to use famotidine for breakthrough symptoms of acid reflux. - Previously gave low FODMAP diet handout - Decrease from omeprazole 40 mg to 20 mg QD - Start famotidine 20 mg BID PRN - Consider repeat colonoscopy 07/2028 for polyps surveillance depending on age and comorbidities - RTC 1 year   Eulah Pont, MD  I spent 36 minutes of time, including in depth chart review, independent review of results as outlined above, communicating results with the patient directly, face-to-face time with the patient, coordinating care, ordering studies and medications as appropriate, and documentation.

## 2022-11-04 NOTE — Patient Instructions (Addendum)
We have sent the following medications to your pharmacy for you to pick up at your convenience: Omeprazole,Famotidine   Follow up in 1 year  _______________________________________________________  If your blood pressure at your visit was 140/90 or greater, please contact your primary care physician to follow up on this.  _______________________________________________________  If you are age 71 or older, your body mass index should be between 23-30. Your Body mass index is 28.07 kg/m. If this is out of the aforementioned range listed, please consider follow up with your Primary Care Provider.  If you are age 38 or younger, your body mass index should be between 19-25. Your Body mass index is 28.07 kg/m. If this is out of the aformentioned range listed, please consider follow up with your Primary Care Provider.   ________________________________________________________  The [facility] providers would like to encourage you to use [facility] to communicate with providers for non-urgent requests or questions.  Due to long hold times on the telephone, sending your provider a message by [facility] may be a faster and more efficient way to get a response.  Please allow 48 business hours for a response.  Please remember that this is for non-urgent requests.  _______________________________________________________   Thank you for entrusting me with your care and for choosing [facility], Dr. Eulah Pont

## 2022-12-01 ENCOUNTER — Encounter: Payer: Self-pay | Admitting: Internal Medicine

## 2023-01-24 ENCOUNTER — Other Ambulatory Visit: Payer: Self-pay | Admitting: Family Medicine

## 2023-04-09 ENCOUNTER — Other Ambulatory Visit: Payer: Self-pay | Admitting: Internal Medicine

## 2023-04-13 ENCOUNTER — Ambulatory Visit (INDEPENDENT_AMBULATORY_CARE_PROVIDER_SITE_OTHER): Payer: Medicare Other | Admitting: Family Medicine

## 2023-04-13 ENCOUNTER — Encounter: Payer: Self-pay | Admitting: Family Medicine

## 2023-04-13 VITALS — BP 112/77 | HR 78 | Temp 97.5°F | Ht 72.0 in | Wt 203.4 lb

## 2023-04-13 DIAGNOSIS — Z8042 Family history of malignant neoplasm of prostate: Secondary | ICD-10-CM | POA: Diagnosis not present

## 2023-04-13 DIAGNOSIS — R351 Nocturia: Secondary | ICD-10-CM

## 2023-04-13 DIAGNOSIS — E785 Hyperlipidemia, unspecified: Secondary | ICD-10-CM

## 2023-04-13 DIAGNOSIS — Z0001 Encounter for general adult medical examination with abnormal findings: Secondary | ICD-10-CM | POA: Diagnosis not present

## 2023-04-13 DIAGNOSIS — E538 Deficiency of other specified B group vitamins: Secondary | ICD-10-CM

## 2023-04-13 DIAGNOSIS — F5101 Primary insomnia: Secondary | ICD-10-CM

## 2023-04-13 DIAGNOSIS — R399 Unspecified symptoms and signs involving the genitourinary system: Secondary | ICD-10-CM

## 2023-04-13 DIAGNOSIS — M069 Rheumatoid arthritis, unspecified: Secondary | ICD-10-CM

## 2023-04-13 DIAGNOSIS — R739 Hyperglycemia, unspecified: Secondary | ICD-10-CM | POA: Diagnosis not present

## 2023-04-13 DIAGNOSIS — I480 Paroxysmal atrial fibrillation: Secondary | ICD-10-CM

## 2023-04-13 LAB — LIPID PANEL
Cholesterol: 150 mg/dL (ref 0–200)
HDL: 45.6 mg/dL (ref >39.00)
LDL Cholesterol: 92 mg/dL (ref 0–99)
NonHDL: 104.74
Total CHOL/HDL Ratio: 3
Triglycerides: 66 mg/dL (ref 0.0–149.0)
VLDL: 13.2 mg/dL (ref 0.0–40.0)

## 2023-04-13 LAB — CBC
HCT: 44.7 % (ref 39.0–52.0)
Hemoglobin: 14.9 g/dL (ref 13.0–17.0)
MCHC: 33.3 g/dL (ref 30.0–36.0)
MCV: 88.7 fL (ref 78.0–100.0)
Platelets: 267 10*3/uL (ref 150.0–400.0)
RBC: 5.04 Mil/uL (ref 4.22–5.81)
RDW: 13.9 % (ref 11.5–15.5)
WBC: 7 10*3/uL (ref 4.0–10.5)

## 2023-04-13 LAB — HEMOGLOBIN A1C: Hgb A1c MFr Bld: 5 % (ref 4.6–6.5)

## 2023-04-13 LAB — PSA: PSA: 1.55 ng/mL (ref 0.10–4.00)

## 2023-04-13 LAB — COMPREHENSIVE METABOLIC PANEL
ALT: 12 U/L (ref 0–53)
AST: 19 U/L (ref 0–37)
Albumin: 4.5 g/dL (ref 3.5–5.2)
Alkaline Phosphatase: 65 U/L (ref 39–117)
BUN: 19 mg/dL (ref 6–23)
CO2: 30 meq/L (ref 19–32)
Calcium: 9.7 mg/dL (ref 8.4–10.5)
Chloride: 104 meq/L (ref 96–112)
Creatinine, Ser: 1.39 mg/dL (ref 0.40–1.50)
GFR: 50.91 mL/min — ABNORMAL LOW (ref >60.00)
Glucose, Bld: 99 mg/dL (ref 70–99)
Potassium: 4.5 meq/L (ref 3.5–5.1)
Sodium: 140 meq/L (ref 135–145)
Total Bilirubin: 1.1 mg/dL (ref 0.2–1.2)
Total Protein: 6.8 g/dL (ref 6.0–8.3)

## 2023-04-13 LAB — TSH: TSH: 2.52 u[IU]/mL (ref 0.35–5.50)

## 2023-04-13 LAB — VITAMIN B12: Vitamin B-12: 991 pg/mL — ABNORMAL HIGH (ref 211–911)

## 2023-04-13 MED ORDER — TRAZODONE HCL 50 MG PO TABS: 50.0000 mg | ORAL_TABLET | Freq: Every evening | ORAL | 3 refills | Status: DC | PRN

## 2023-04-13 MED ORDER — LORAZEPAM 1 MG PO TABS: 1.0000 mg | ORAL_TABLET | Freq: Every day | ORAL | 5 refills | Status: AC

## 2023-04-13 NOTE — Progress Notes (Signed)
Chief Complaint:  William West is a 72 y.o. male who presents today for his annual comprehensive physical exam.    Assessment/Plan:  Chronic Problems Addressed Today: Dyslipidemia Discussed lifestyle modifications. Check labs.   Hyperglycemia Check A1c.   Rheumatoid arthritis (HCC), on Enbrel, followed by William West On Enbrel per rheumatology.   Insomnia Still having intermittent issues with insomnia. HE has been tolerating trazodone as needed. This works reasonably well. He will continue this. He can take 50-100 mg nightly as needed.   Family history of prostate cancer Check PSA today. Normal prostate exam today.   Low serum vitamin B12 Check B12.   Atrial fibrillation (HCC), on Cardizem daily, with prn ASA and Diltiazem Follows with cardiology. RRR today. On aspirin and cardizem per cardiology.   Preventative Healthcare: Check labs. UpToDate on vaccines. UpToDate on colon cancer screening.   Patient Counseling(The following topics were reviewed and/or handout was given):  -Nutrition: Stressed importance of moderation in sodium/caffeine intake, saturated fat and cholesterol, caloric balance, sufficient intake of fresh fruits, vegetables, and fiber.  -Stressed the importance of regular exercise.   -Substance Abuse: Discussed cessation/primary prevention of tobacco, alcohol, or other drug use; driving or other dangerous activities under the influence; availability of treatment for abuse.   -Injury prevention: Discussed safety belts, safety helmets, smoke detector, smoking near bedding or upholstery.   -Sexuality: Discussed sexually transmitted diseases, partner selection, use of condoms, avoidance of unintended pregnancy and contraceptive alternatives.   -Dental health: Discussed importance of regular tooth brushing, flossing, and dental visits.  -Health maintenance and immunizations reviewed. Please refer to Health maintenance section.  Return to care in 1 year for next  preventative visit.     Subjective:  HPI:  He has no acute complaints today. See Assessment / plan for status of chronic conditions.   Lifestyle Diet: Balanced. Plenty of fruits and vegetables.  Exercise: Going to gym routinely.      04/13/2023    8:56 AM  Depression screen PHQ 2/9  Decreased Interest 0  Down, Depressed, Hopeless 0  PHQ - 2 Score 0    Health Maintenance Due  Topic Date Due   Lung Cancer Screening  09/19/2022   Medicare Annual Wellness (AWV)  05/01/2023     ROS: Per HPI, otherwise a complete review of systems was negative.   PMH:  The following were reviewed and entered/updated in epic: Past Medical History:  Diagnosis Date   A-fib (HCC)    Acute blood loss anemia 01/26/2015   Allergic rhinitis    Diverticulosis of colon    Dysplastic nevus    GERD (gastroesophageal reflux disease)    GI bleeding 01/24/2015   Hypercholesterolemia    Postural dizziness with near syncope due to dehydration 12/09/2016   RA (rheumatoid arthritis) [facility])    Patient Active Problem List   Diagnosis Date Noted   Low serum vitamin B12 04/09/2022   Lower urinary tract symptoms (LUTS) 04/07/2022   TB lung, latent 08/21/2021   Hyperglycemia 04/05/2019   Family history of prostate cancer 04/03/2019   GERD (gastroesophageal reflux disease), on Omeprazole 07/01/2018   History of GI bleed 07/01/2018   Insomnia 07/01/2018   Abnormal CT of the chest 03/03/2018   Sensorineural hearing loss (SNHL), bilateral 12/28/2017   Tinnitus, bilateral 12/28/2017   Pulmonary nodule, left 12/09/2016   Former smoker 01/16/2015   Atrial fibrillation (HCC), on Cardizem daily, with prn ASA and Diltiazem 04/25/2012   Rheumatoid arthritis (HCC), on Enbrel, followed by Dr.  Beekman 01/06/2011   Allergic rhinitis 04/22/2009   Dyslipidemia 10/24/2008   Diverticulosis of large intestine 06/21/2007   Past Surgical History:  Procedure Laterality Date   COLONOSCOPY  01/24/2015   x 3   dental  implant     ERCP N/A 02/18/2015   Procedure: ENDOSCOPIC RETROGRADE CHOLANGIOPANCREATOGRAPHY (ERCP);  Surgeon: Jeani Hawking, MD;  Location: [facility] ENDOSCOPY;  Service: Endoscopy;  Laterality: N/A;   GIVENS CAPSULE STUDY N/A 01/24/2015   Procedure: GIVENS CAPSULE STUDY;  Surgeon: Charna Elizabeth, MD;  Location: [facility] ENDOSCOPY;  Service: Endoscopy;  Laterality: N/A;   INGUINAL HERNIA REPAIR Right 07/27/2019   Procedure: LAPAROSCOPIC RIGHT INGUINAL HERNIA REPAIR WITH MESH;  Surgeon: Axel Filler, MD;  Location: [facility] OR;  Service: General;  Laterality: Right;   LAPAROSCOPIC CHOLECYSTECTOMY  2011   TOE AMPUTATION  1966   TONSILLECTOMY AND ADENOIDECTOMY      Family History  Problem Relation Age of Onset   Heart disease Mother    Rheum arthritis Mother    Prostate cancer Father    Colon cancer Neg Hx    Esophageal cancer Neg Hx    Rectal cancer Neg Hx    Stomach cancer Neg Hx     Medications- reviewed and updated Current Outpatient Medications  Medication Sig Dispense Refill   acetaminophen (TYLENOL) 160 MG/5ML liquid Take 325 mg by mouth every 4 (four) hours as needed for pain.     Ascorbic Acid (VITAMIN C) 100 MG tablet Take 100 mg by mouth daily.     aspirin EC 81 MG tablet Take 81 mg by mouth daily as needed (afib).     azelastine (ASTELIN) 0.1 % nasal spray Place 2 sprays into both nostrils 2 (two) times daily. 30 mL 12   clotrimazole (LOTRIMIN) 1 % cream Apply 1 application topically daily as needed (jock itch).     Cyanocobalamin (B-12 PO) Take by mouth.     diltiazem (CARDIZEM CD) 120 MG 24 hr capsule Take 1 capsule by mouth once daily 30 capsule 0   docusate sodium (COLACE) 100 MG capsule Take 100 mg by mouth as needed.      etanercept (ENBREL) 50 MG/ML injection Inject 50 mg into the skin every Monday.      famotidine (PEPCID) 20 MG tablet Take 1 tablet (20 mg total) by mouth 2 (two) times daily as needed for heartburn or indigestion. For break through of acid reflux 180 tablet 1    ibuprofen (ADVIL,MOTRIN) 200 MG tablet Take 600 mg by mouth every 8 (eight) hours as needed for moderate pain.      LORazepam (ATIVAN) 1 MG tablet TAKE 1 TABLET BY MOUTH AT BEDTIME (Patient taking differently: as needed.) 30 tablet 0   melatonin 5 MG TABS Take 5 mg by mouth.     Multiple Vitamin (MULTIVITAMIN) tablet Take 1 tablet by mouth daily.     neomycin-bacitracin-polymyxin (NEOSPORIN) ointment Apply 1 application topically as needed for wound care.     omeprazole (PRILOSEC) 20 MG capsule Take 1 capsule (20 mg total) by mouth daily. 90 capsule 3   Probiotic Product (PROBIOTIC-10 ULTIMATE) CAPS Take by mouth as needed.     traZODone (DESYREL) 50 MG tablet TAKE 1/2 TO 1 (ONE-HALF TO ONE) TABLET BY MOUTH AT BEDTIME AS NEEDED FOR SLEEP 30 tablet 0   vitamin C (ASCORBIC ACID) 500 MG tablet Take 500 mg by mouth daily.     No current facility-administered medications for this visit.    Allergies-reviewed and updated Allergies  Allergen Reactions   Caffeine Other (See Comments)    Causes him to go into afib   Flecainide Other (See Comments)    Caused heart to race too fast and had him in the ED   Methotrexate Derivatives Nausea And Vomiting    Increased liver enzymes   Pseudoephedrine-Ibuprofen Other (See Comments)    " afib"    Penicillins Rash    Did it involve swelling of the face/tongue/throat, SOB, or low BP? No Did it involve sudden or severe rash/hives, skin peeling, or any reaction on the inside of your mouth or nose? No Did you need to seek medical attention at a hospital or doctor's office? No When did it last happen?      40 + years If all above answers are "NO", may proceed with cephalosporin use.     Social History   Socioeconomic History   Marital status: Married    Spouse name: Larita Fife   Number of children: 2   Years of education: Not on file   Highest education level: Not on file  Occupational History   Occupation: [employer]    Comment: RETIRED  Tobacco Use    Smoking status: Former    Current packs/day: 0.00    Average packs/day: 0.5 packs/day for 45.0 years (22.5 ttl pk-yrs)    Types: Cigarettes    Start date: 11/13/1964    Quit date: 11/13/2009    Years since quitting: 13.4   Smokeless tobacco: Never  Vaping Use   Vaping status: Never Used  Substance and Sexual Activity   Alcohol use: No    Alcohol/week: 0.0 standard drinks of alcohol   Drug use: No   Sexual activity: Never  Other Topics Concern   Not on file  Social History Narrative   One son with HBP. One son with IDDM. Exercises regularly at the [facility]. Eats well. Down 30 pounds with the lifestyle change. Exercise keeps him from feeling so stiff. Weighs daily. Baseline 199-204.    Social Determinants of Health   Financial Resource Strain: Low Risk  (04/30/2022)   Overall Financial Resource Strain (CARDIA)    Difficulty of Paying Living Expenses: Not hard at all  Food Insecurity: No Food Insecurity (04/30/2022)   Hunger Vital Sign    Worried About Running Out of Food in the Last Year: Never true    Ran Out of Food in the Last Year: Never true  Transportation Needs: No Transportation Needs (04/30/2022)   PRAPARE - Administrator, Civil Service (Medical): No    Lack of Transportation (Non-Medical): No  Physical Activity: Sufficiently Active (04/30/2022)   Exercise Vital Sign    Days of Exercise per Week: 4 days    Minutes of Exercise per Session: 60 min  Stress: No Stress Concern Present (04/30/2022)   [employer] of Occupational Health - Occupational Stress Questionnaire    Feeling of Stress : Not at all  Social Connections: Socially Integrated (04/30/2022)   Social Connection and Isolation Panel [NHANES]    Frequency of Communication with Friends and Family: More than three times a week    Frequency of Social Gatherings with Friends and Family: More than three times a week    Attends Religious Services: More than 4 times per year    Active Member of [employer] or  Organizations: Yes    Attends Banker Meetings: 1 to 4 times per year    Marital Status: Married  Objective:  Physical Exam: BP 112/77   Pulse 78   Temp (!) 97.5 F (36.4 C) (Temporal)   Ht 6' (1.829 m)   Wt 203 lb 6.4 oz (92.3 kg)   SpO2 100%   BMI 27.59 kg/m   Body mass index is 27.59 kg/m. Wt Readings from Last 3 Encounters:  04/13/23 203 lb 6.4 oz (92.3 kg)  11/04/22 207 lb (93.9 kg)  10/07/22 209 lb 3.2 oz (94.9 kg)   Gen: NAD, resting comfortably HEENT: TMs normal bilaterally. OP clear. No thyromegaly noted.  CV: RRR with no murmurs appreciated Pulm: NWOB, CTAB with no crackles, wheezes, or rhonchi GI: Normal bowel sounds present. Soft, Nontender, Nondistended. MSK: no edema, cyanosis, or clubbing noted GU: Mild prostatic enlargement.  No nodules.   Skin: warm, dry Neuro: CN2-12 grossly intact. Strength 5/5 in upper and lower extremities. Reflexes symmetric and intact bilaterally.  Psych: Normal affect and thought content     Jamesha Ellsworth M. Jimmey Ralph, MD 04/13/2023 9:30 AM

## 2023-04-13 NOTE — Assessment & Plan Note (Signed)
Check B12 

## 2023-04-13 NOTE — Assessment & Plan Note (Signed)
Check A1c. 

## 2023-04-13 NOTE — Assessment & Plan Note (Signed)
Follows with cardiology. RRR today. On aspirin and cardizem per cardiology.

## 2023-04-13 NOTE — Patient Instructions (Addendum)
It was very nice to see you today!  We will check blood work today.   Please continue to work on diet and exercise.   Return in about 1 year (around 04/12/2024) for Annual Physical.   Take care, Dr Jimmey Ralph  PLEASE NOTE:  If you had any lab tests, please let us know if you have not heard back within a few days. You may see your results on mychart before we have a chance to review them but we will give you a call once they are reviewed by [place].   If we ordered any referrals today, please let us know if you have not heard from their office within the next week.   If you had any urgent prescriptions sent in today, please check with the pharmacy within an hour of our visit to make sure the prescription was transmitted appropriately.   Please try these tips to maintain a healthy lifestyle:  Eat at least 3 REAL meals and 1-2 snacks per day.  Aim for no more than 5 hours between eating.  If you eat breakfast, please do so within one hour of getting up.   Each meal should contain half fruits/vegetables, one quarter protein, and one quarter carbs (no bigger than a computer mouse)  Cut down on sweet beverages. This includes juice, soda, and sweet tea.   Drink at least 1 glass of water with each meal and aim for at least 8 glasses per day  Exercise at least 150 minutes every week.    Preventive Care 67 Years and Older, Male Preventive care refers to lifestyle choices and visits with your health care provider that can promote health and wellness. Preventive care visits are also called wellness exams. What can I expect for my preventive care visit? Counseling During your preventive care visit, your health care provider may ask about your: Medical history, including: Past medical problems. Family medical history. History of falls. Current health, including: Emotional well-being. Home life and relationship well-being. Sexual activity. Memory and ability to understand (cognition). Lifestyle,  including: Alcohol, nicotine or tobacco, and drug use. Access to firearms. Diet, exercise, and sleep habits. Work and work Astronomer. Sunscreen use. Safety issues such as seatbelt and bike helmet use. Physical exam Your health care provider will check your: Height and weight. These may be used to calculate your BMI (body mass index). BMI is a measurement that tells if you are at a healthy weight. Waist circumference. This measures the distance around your waistline. This measurement also tells if you are at a healthy weight and may help predict your risk of certain diseases, such as type 2 diabetes and high blood pressure. Heart rate and blood pressure. Body temperature. Skin for abnormal spots. What immunizations do I need?  Vaccines are usually given at various ages, according to a schedule. Your health care provider will recommend vaccines for you based on your age, medical history, and lifestyle or other factors, such as travel or where you work. What tests do I need? Screening Your health care provider may recommend screening tests for certain conditions. This may include: Lipid and cholesterol levels. Diabetes screening. This is done by checking your blood sugar (glucose) after you have not eaten for a while (fasting). Hepatitis C test. Hepatitis B test. HIV (human immunodeficiency virus) test. STI (sexually transmitted infection) testing, if you are at risk. Lung cancer screening. Colorectal cancer screening. Prostate cancer screening. Abdominal aortic aneurysm (AAA) screening. You may need this if you are a current or  former smoker. Talk with your health care provider about your test results, treatment options, and if necessary, the need for more tests. Follow these instructions at home: Eating and drinking  Eat a diet that includes fresh fruits and vegetables, whole grains, lean protein, and low-fat dairy products. Limit your intake of foods with high amounts of sugar,  saturated fats, and salt. Take vitamin and mineral supplements as recommended by your health care provider. Do not drink alcohol if your health care provider tells you not to drink. If you drink alcohol: Limit how much you have to 0-2 drinks a day. Know how much alcohol is in your drink. In the [place] one drink equals one 12 oz bottle of beer (355 mL), one 5 oz glass of wine (148 mL), or one 1 oz glass of hard liquor (44 mL). Lifestyle Brush your teeth every morning and night with fluoride toothpaste. Floss one time each day. Exercise for at least 30 minutes 5 or more days each week. Do not use any products that contain nicotine or tobacco. These products include cigarettes, chewing tobacco, and vaping devices, such as e-cigarettes. If you need help quitting, ask your health care provider. Do not use drugs. If you are sexually active, practice safe sex. Use a condom or other form of protection to prevent STIs. Take aspirin only as told by your health care provider. Make sure that you understand how much to take and what form to take. Work with your health care provider to find out whether it is safe and beneficial for you to take aspirin daily. Ask your health care provider if you need to take a cholesterol-lowering medicine (statin). Find healthy ways to manage stress, such as: Meditation, yoga, or listening to music. Journaling. Talking to a trusted person. Spending time with friends and family. Safety Always wear your seat belt while driving or riding in a vehicle. Do not drive: If you have been drinking alcohol. Do not ride with someone who has been drinking. When you are tired or distracted. While texting. If you have been using any mind-altering substances or drugs. Wear a helmet and other protective equipment during sports activities. If you have firearms in your house, make sure you follow all gun safety procedures. Minimize exposure to UV radiation to reduce your risk of skin  cancer. What's next? Visit your health care provider once a year for an annual wellness visit. Ask your health care provider how often you should have your eyes and teeth checked. Stay up to date on all vaccines. This information is not intended to replace advice given to you by your health care provider. Make sure you discuss any questions you have with your health care provider. Document Revised: 11/05/2020 Document Reviewed: 11/05/2020 Elsevier Patient Education  2024 [employer].

## 2023-04-13 NOTE — Assessment & Plan Note (Signed)
On Enbrel per rheumatology.  

## 2023-04-13 NOTE — Assessment & Plan Note (Signed)
Discussed lifestyle modifications.  Check labs. 

## 2023-04-13 NOTE — Assessment & Plan Note (Signed)
Check PSA today. Normal prostate exam today.

## 2023-04-13 NOTE — Assessment & Plan Note (Signed)
Still having intermittent issues with insomnia. HE has been tolerating trazodone as needed. This works reasonably well. He will continue this. He can take 50-100 mg nightly as needed.

## 2023-04-14 ENCOUNTER — Encounter: Payer: Self-pay | Admitting: Internal Medicine

## 2023-04-14 ENCOUNTER — Ambulatory Visit: Payer: Medicare Other | Attending: Internal Medicine | Admitting: Internal Medicine

## 2023-04-14 VITALS — BP 100/70 | HR 94 | Ht 72.0 in | Wt 202.0 lb

## 2023-04-14 DIAGNOSIS — I480 Paroxysmal atrial fibrillation: Secondary | ICD-10-CM | POA: Diagnosis not present

## 2023-04-14 MED ORDER — DILTIAZEM HCL ER COATED BEADS 120 MG PO CP24: 120.0000 mg | ORAL_CAPSULE | Freq: Every day | ORAL | 3 refills | Status: DC

## 2023-04-14 NOTE — Patient Instructions (Signed)

## 2023-04-14 NOTE — Progress Notes (Signed)
HPI William West returns today for follow-up and for evaluation of syncope. He is a pleasant 72 year old man with paroxysmal atrial fibrillation, and relative intolerance to flecainide. The patient has been well-controlled with regard to his atrial fibrillation in the past. He has had episodes of syncope which were thought to be mostly related to autonomic dysfunction.  He feels well. He has has not had any symptoms since I saw him a year ago.   Allergies  Allergen Reactions   Caffeine Other (See Comments)    Causes him to go into afib   Flecainide Other (See Comments)    Caused heart to race too fast and had him in the ED   Methotrexate Derivatives Nausea And Vomiting    Increased liver enzymes   Pseudoephedrine-Ibuprofen Other (See Comments)    " afib"    Penicillins Rash    Did it involve swelling of the face/tongue/throat, SOB, or low BP? No Did it involve sudden or severe rash/hives, skin peeling, or any reaction on the inside of your mouth or nose? No Did you need to seek medical attention at a hospital or doctor's office? No When did it last happen?      40 + years If all above answers are "NO", may proceed with cephalosporin use.      Current Outpatient Medications  Medication Sig Dispense Refill   acetaminophen (TYLENOL) 160 MG/5ML liquid Take 325 mg by mouth every 4 (four) hours as needed for pain.     Ascorbic Acid (VITAMIN C) 100 MG tablet Take 100 mg by mouth daily.     aspirin EC 81 MG tablet Take 81 mg by mouth daily as needed (afib).     augmented betamethasone dipropionate (DIPROLENE-AF) 0.05 % cream Apply topically 2 (two) times daily as needed.     azelastine (ASTELIN) 0.1 % nasal spray Place 2 sprays into both nostrils 2 (two) times daily. 30 mL 12   clotrimazole (LOTRIMIN) 1 % cream Apply 1 application topically daily as needed (jock itch).     Cyanocobalamin (B-12 PO) Take by mouth.     diltiazem (CARDIZEM CD) 120 MG 24 hr capsule Take 1 capsule by  mouth once daily 30 capsule 0   docusate sodium (COLACE) 100 MG capsule Take 100 mg by mouth as needed.      etanercept (ENBREL) 50 MG/ML injection Inject 50 mg into the skin every Monday.      famotidine (PEPCID) 20 MG tablet Take 1 tablet (20 mg total) by mouth 2 (two) times daily as needed for heartburn or indigestion. For break through of acid reflux 180 tablet 1   ibuprofen (ADVIL,MOTRIN) 200 MG tablet Take 600 mg by mouth every 8 (eight) hours as needed for moderate pain.      LORazepam (ATIVAN) 1 MG tablet Take 1 tablet (1 mg total) by mouth at bedtime. 30 tablet 5   melatonin 5 MG TABS Take 5 mg by mouth.     Multiple Vitamin (MULTIVITAMIN) tablet Take 1 tablet by mouth daily.     neomycin-bacitracin-polymyxin (NEOSPORIN) ointment Apply 1 application topically as needed for wound care.     omeprazole (PRILOSEC) 20 MG capsule Take 1 capsule (20 mg total) by mouth daily. 90 capsule 3   Probiotic Product (PROBIOTIC-10 ULTIMATE) CAPS Take by mouth as needed.     traZODone (DESYREL) 50 MG tablet Take 1-2 tablets (50-100 mg total) by mouth at bedtime as needed for sleep. 90 tablet 3   vitamin  C (ASCORBIC ACID) 500 MG tablet Take 500 mg by mouth daily.     No current facility-administered medications for this visit.     Past Medical History:  Diagnosis Date   A-fib (HCC)    Acute blood loss anemia 01/26/2015   Allergic rhinitis    Diverticulosis of colon    Dysplastic nevus    GERD (gastroesophageal reflux disease)    GI bleeding 01/24/2015   Hypercholesterolemia    Postural dizziness with near syncope due to dehydration 12/09/2016   RA (rheumatoid arthritis) (HCC)     ROS:   All systems reviewed and negative except as noted in the HPI.   Past Surgical History:  Procedure Laterality Date   COLONOSCOPY  01/24/2015   x 3   dental implant     ERCP N/A 02/18/2015   Procedure: ENDOSCOPIC RETROGRADE CHOLANGIOPANCREATOGRAPHY (ERCP);  Surgeon: Jeani Hawking, MD;  Location: [facility]  ENDOSCOPY;  Service: Endoscopy;  Laterality: N/A;   GIVENS CAPSULE STUDY N/A 01/24/2015   Procedure: GIVENS CAPSULE STUDY;  Surgeon: Charna Elizabeth, MD;  Location: [facility] ENDOSCOPY;  Service: Endoscopy;  Laterality: N/A;   INGUINAL HERNIA REPAIR Right 07/27/2019   Procedure: LAPAROSCOPIC RIGHT INGUINAL HERNIA REPAIR WITH MESH;  Surgeon: Axel Filler, MD;  Location: [facility] OR;  Service: General;  Laterality: Right;   LAPAROSCOPIC CHOLECYSTECTOMY  2011   TOE AMPUTATION  1966   TONSILLECTOMY AND ADENOIDECTOMY       Family History  Problem Relation Age of Onset   Heart disease Mother    Rheum arthritis Mother    Prostate cancer Father    Colon cancer Neg Hx    Esophageal cancer Neg Hx    Rectal cancer Neg Hx    Stomach cancer Neg Hx      Social History   Socioeconomic History   Marital status: Married    Spouse name: Larita Fife   Number of children: 2   Years of education: Not on file   Highest education level: Not on file  Occupational History   Occupation: [employer]    Comment: RETIRED  Tobacco Use   Smoking status: Former    Current packs/day: 0.00    Average packs/day: 0.5 packs/day for 45.0 years (22.5 ttl pk-yrs)    Types: Cigarettes    Start date: 11/13/1964    Quit date: 11/13/2009    Years since quitting: 13.4   Smokeless tobacco: Never  Vaping Use   Vaping status: Never Used  Substance and Sexual Activity   Alcohol use: No    Alcohol/week: 0.0 standard drinks of alcohol   Drug use: No   Sexual activity: Never  Other Topics Concern   Not on file  Social History Narrative   One son with HBP. One son with IDDM. Exercises regularly at the [facility]. Eats well. Down 30 pounds with the lifestyle change. Exercise keeps him from feeling so stiff. Weighs daily. Baseline 199-204.    Social Determinants of Health   Financial Resource Strain: Low Risk  (04/30/2022)   Overall Financial Resource Strain (CARDIA)    Difficulty of Paying Living Expenses: Not hard at all  Food Insecurity: No  Food Insecurity (04/30/2022)   Hunger Vital Sign    Worried About Running Out of Food in the Last Year: Never true    Ran Out of Food in the Last Year: Never true  Transportation Needs: No Transportation Needs (04/30/2022)   PRAPARE - Administrator, Civil Service (Medical): No    Lack  of Transportation (Non-Medical): No  Physical Activity: Sufficiently Active (04/30/2022)   Exercise Vital Sign    Days of Exercise per Week: 4 days    Minutes of Exercise per Session: 60 min  Stress: No Stress Concern Present (04/30/2022)   [employer] of Occupational Health - Occupational Stress Questionnaire    Feeling of Stress : Not at all  Social Connections: Socially Integrated (04/30/2022)   Social Connection and Isolation Panel [NHANES]    Frequency of Communication with Friends and Family: More than three times a week    Frequency of Social Gatherings with Friends and Family: More than three times a week    Attends Religious Services: More than 4 times per year    Active Member of [employer] or Organizations: Yes    Attends Banker Meetings: 1 to 4 times per year    Marital Status: Married  Catering manager Violence: Not At Risk (04/30/2022)   Humiliation, Afraid, Rape, and Kick questionnaire    Fear of Current or Ex-Partner: No    Emotionally Abused: No    Physically Abused: No    Sexually Abused: No     BP 100/70   Pulse 94   Ht 6' (1.829 m)   Wt 202 lb (91.6 kg)   SpO2 97%   BMI 27.40 kg/m   Physical Exam:  Well appearing NAD HEENT: Unremarkable Neck:  No JVD, no thyromegally Lymphatics:  No adenopathy Back:  No CVA tenderness Lungs:  Clear with no wheezes HEART:  IRegular rate rhythm, no murmurs, no rubs, no clicks Abd:  soft, positive bowel sounds, no organomegally, no rebound, no guarding Ext:  2 plus pulses, no edema, no cyanosis, no clubbing Skin:  No rashes no nodules Neuro:  CN II through XII intact, motor grossly intact  EKG Atrial fib with a  controlled VR  Assess/Plan: 1. PAF -  his atrial fib has been well controlled. He will continue cardizem. His stroke risk is low. Chadsvasc is 1. He did not know that he was out of rhythm today.  2. Autonomic dysfunction - he has been asymptomatic. No syncope.    Sharlot Gowda Taytem Ghattas,MD

## 2023-04-14 NOTE — Addendum Note (Signed)
Addended by: Kerney Elbe on: 04/14/2023 01:44 PM   Modules accepted: Orders

## 2023-04-14 NOTE — Progress Notes (Signed)
Great news! Labs are all at goal.  Do not need to add any changes to treatment plan.  He should continue to work on diet and exercise and we can recheck in a year or so.

## 2023-04-15 ENCOUNTER — Other Ambulatory Visit: Payer: Self-pay | Admitting: *Deleted

## 2023-04-15 DIAGNOSIS — R944 Abnormal results of kidney function studies: Secondary | ICD-10-CM

## 2023-04-15 NOTE — Progress Notes (Signed)
His GFR is a calculated value based off of his creatinine.  This can fluctuate quite a bit depending on diet, activity level, and especially hydration status.  He likely does have some age-related decline in kidney function however this is not a major concern at this point.    He can come back to recheck in a week or two if he wishes.  Recommend that he get plenty of fluids prior to his visit.  Please place future order for CMET.

## 2023-04-16 ENCOUNTER — Encounter: Payer: Self-pay | Admitting: Internal Medicine

## 2023-04-19 ENCOUNTER — Encounter: Payer: Self-pay | Admitting: Internal Medicine

## 2023-05-09 ENCOUNTER — Ambulatory Visit (INDEPENDENT_AMBULATORY_CARE_PROVIDER_SITE_OTHER): Payer: Medicare Other

## 2023-05-09 VITALS — Wt 200.0 lb

## 2023-05-09 DIAGNOSIS — Z0000 Encounter for general adult medical examination without abnormal findings: Secondary | ICD-10-CM

## 2023-05-09 NOTE — Progress Notes (Signed)
Subjective:   William West is a 72 y.o. male who presents for Medicare Annual/Subsequent preventive examination.  Visit Complete: Virtual I connected with  HRISTOPHER HOPMAN on 05/09/23 by a audio enabled telemedicine application and verified that I am speaking with the correct person using two identifiers.  Patient Location: Home  Provider Location: Office/Clinic  I discussed the limitations of evaluation and management by telemedicine. The patient expressed understanding and agreed to proceed.  Vital Signs: Because this visit was a virtual/telehealth visit, some criteria may be missing or patient reported. Any vitals not documented were not able to be obtained and vitals that have been documented are patient reported.   Cardiac Risk Factors include: advanced age (>56men, >56 women);dyslipidemia;male gender     Objective:    Today's Vitals   05/09/23 1150  Weight: 200 lb (90.7 kg)   Body mass index is 27.12 kg/m.     04/30/2022    7:59 AM 04/24/2021    3:21 PM 11/22/2020    8:01 PM 09/28/2019   11:11 AM 09/20/2019    5:31 PM 07/27/2019    7:44 AM 07/24/2019    1:37 PM  Advanced Directives  Does Patient Have a Medical Advance Directive? Yes Yes No No Yes Yes Yes  Type of Estate agent of [place];Living will Healthcare Power of [employer] Power of [employer] Power of [place];Living will Living will Living will  Does patient want to make changes to medical advance directive?      No - Patient declined No - Guardian declined  Copy of Healthcare Power of Attorney in Chart? No - copy requested No - copy requested  No - copy requested     Would patient like information on creating a medical advance directive?   No - Patient declined No - Patient declined       Current Medications (verified) Outpatient Encounter Medications as of 05/09/2023  Medication Sig   acetaminophen (TYLENOL) 160 MG/5ML liquid Take 325 mg by mouth every 4 (four) hours  as needed for pain.   Ascorbic Acid (VITAMIN C) 100 MG tablet Take 100 mg by mouth daily.   augmented betamethasone dipropionate (DIPROLENE-AF) 0.05 % cream Apply topically 2 (two) times daily as needed.   azelastine (ASTELIN) 0.1 % nasal spray Place 2 sprays into both nostrils 2 (two) times daily.   Cyanocobalamin (B-12 PO) Take by mouth.   diltiazem (CARDIZEM CD) 120 MG 24 hr capsule Take 1 capsule (120 mg total) by mouth daily.   docusate sodium (COLACE) 100 MG capsule Take 100 mg by mouth as needed.    etanercept (ENBREL) 50 MG/ML injection Inject 50 mg into the skin every Monday.    famotidine (PEPCID) 20 MG tablet Take 1 tablet (20 mg total) by mouth 2 (two) times daily as needed for heartburn or indigestion. For break through of acid reflux   LORazepam (ATIVAN) 1 MG tablet Take 1 tablet (1 mg total) by mouth at bedtime.   melatonin 5 MG TABS Take 5 mg by mouth.   Multiple Vitamin (MULTIVITAMIN) tablet Take 1 tablet by mouth daily.   neomycin-bacitracin-polymyxin (NEOSPORIN) ointment Apply 1 application topically as needed for wound care.   omeprazole (PRILOSEC) 20 MG capsule Take 1 capsule (20 mg total) by mouth daily.   Probiotic Product (PROBIOTIC-10 ULTIMATE) CAPS Take by mouth as needed.   traZODone (DESYREL) 50 MG tablet Take 1-2 tablets (50-100 mg total) by mouth at bedtime as needed for sleep.  vitamin C (ASCORBIC ACID) 500 MG tablet Take 500 mg by mouth daily.   clotrimazole (LOTRIMIN) 1 % cream Apply 1 application topically daily as needed (jock itch). (Patient not taking: Reported on 05/09/2023)   ibuprofen (ADVIL,MOTRIN) 200 MG tablet Take 600 mg by mouth every 8 (eight) hours as needed for moderate pain.  (Patient not taking: Reported on 05/09/2023)   [DISCONTINUED] aspirin EC 81 MG tablet Take 81 mg by mouth daily as needed (afib).   No facility-administered encounter medications on file as of 05/09/2023.    Allergies (verified) Caffeine, Flecainide, Methotrexate  derivatives, Pseudoephedrine-ibuprofen, and Penicillins   History: Past Medical History:  Diagnosis Date   A-fib (HCC)    Acute blood loss anemia 01/26/2015   Allergic rhinitis    Diverticulosis of colon    Dysplastic nevus    GERD (gastroesophageal reflux disease)    GI bleeding 01/24/2015   Hypercholesterolemia    Postural dizziness with near syncope due to dehydration 12/09/2016   RA (rheumatoid arthritis) [facility])    Past Surgical History:  Procedure Laterality Date   COLONOSCOPY  01/24/2015   x 3   dental implant     ERCP N/A 02/18/2015   Procedure: ENDOSCOPIC RETROGRADE CHOLANGIOPANCREATOGRAPHY (ERCP);  Surgeon: Jeani Hawking, MD;  Location: [facility] ENDOSCOPY;  Service: Endoscopy;  Laterality: N/A;   GIVENS CAPSULE STUDY N/A 01/24/2015   Procedure: GIVENS CAPSULE STUDY;  Surgeon: Charna Elizabeth, MD;  Location: [facility] ENDOSCOPY;  Service: Endoscopy;  Laterality: N/A;   INGUINAL HERNIA REPAIR Right 07/27/2019   Procedure: LAPAROSCOPIC RIGHT INGUINAL HERNIA REPAIR WITH MESH;  Surgeon: Axel Filler, MD;  Location: [facility] OR;  Service: General;  Laterality: Right;   LAPAROSCOPIC CHOLECYSTECTOMY  2011   TOE AMPUTATION  1966   TONSILLECTOMY AND ADENOIDECTOMY     Family History  Problem Relation Age of Onset   Heart disease Mother    Rheum arthritis Mother    Prostate cancer Father    Colon cancer Neg Hx    Esophageal cancer Neg Hx    Rectal cancer Neg Hx    Stomach cancer Neg Hx    Social History   Socioeconomic History   Marital status: Married    Spouse name: Larita Fife   Number of children: 2   Years of education: Not on file   Highest education level: Not on file  Occupational History   Occupation: [employer]    Comment: RETIRED  Tobacco Use   Smoking status: Former    Current packs/day: 0.00    Average packs/day: 0.5 packs/day for 45.0 years (22.5 ttl pk-yrs)    Types: Cigarettes    Start date: 11/13/1964    Quit date: 11/13/2009    Years since quitting: 13.4   Smokeless tobacco:  Never  Vaping Use   Vaping status: Never Used  Substance and Sexual Activity   Alcohol use: No    Alcohol/week: 0.0 standard drinks of alcohol   Drug use: No   Sexual activity: Never  Other Topics Concern   Not on file  Social History Narrative   One son with HBP. One son with IDDM. Exercises regularly at the [facility]. Eats well. Down 30 pounds with the lifestyle change. Exercise keeps him from feeling so stiff. Weighs daily. Baseline 199-204.    Social Drivers of Corporate investment banker Strain: Low Risk  (04/30/2022)   Overall Financial Resource Strain (CARDIA)    Difficulty of Paying Living Expenses: Not hard at all  Food Insecurity: No Food Insecurity (  04/30/2022)   Hunger Vital Sign    Worried About Running Out of Food in the Last Year: Never true    Ran Out of Food in the Last Year: Never true  Transportation Needs: No Transportation Needs (04/30/2022)   PRAPARE - Administrator, Civil Service (Medical): No    Lack of Transportation (Non-Medical): No  Physical Activity: Sufficiently Active (04/30/2022)   Exercise Vital Sign    Days of Exercise per Week: 4 days    Minutes of Exercise per Session: 60 min  Stress: No Stress Concern Present (04/30/2022)   [employer] of Occupational Health - Occupational Stress Questionnaire    Feeling of Stress : Not at all  Social Connections: Socially Integrated (04/30/2022)   Social Connection and Isolation Panel [NHANES]    Frequency of Communication with Friends and Family: More than three times a week    Frequency of Social Gatherings with Friends and Family: More than three times a week    Attends Religious Services: More than 4 times per year    Active Member of [employer] or Organizations: Yes    Attends Banker Meetings: 1 to 4 times per year    Marital Status: Married    Tobacco Counseling Counseling given: Not Answered   Clinical Intake:  Pre-visit preparation completed: Yes  Pain : No/denies  pain     BMI - recorded: 27.12 Nutritional Status: BMI 25 -29 Overweight Nutritional Risks: None Diabetes: No  How often do you need to have someone help you when you read instructions, pamphlets, or other written materials from your doctor or pharmacy?: 1 - Never  Interpreter Needed?: No  Information entered by :: Lanier Ensign, LPN   Activities of Daily Living    05/09/2023   11:51 AM  In your present state of health, do you have any difficulty performing the following activities:  Hearing? 1  Comment HOH  Vision? 0  Difficulty concentrating or making decisions? 0  Walking or climbing stairs? 0  Dressing or bathing? 0  Doing errands, shopping? 0  Preparing Food and eating ? N  Using the Toilet? N  In the past six months, have you accidently leaked urine? N  Do you have problems with loss of bowel control? N  Managing your Medications? N  Managing your Finances? N  Housekeeping or managing your Housekeeping? N    Patient Care Team: Ardith Dark, MD as PCP - General (Family Medicine) Donnetta Hail, MD as Consulting Physician (Rheumatology) Charna Elizabeth, MD as Consulting Physician (Gastroenterology) Dahlia Byes, [facility] (Inactive) as Pharmacist (Pharmacist)  Indicate any recent Medical Services you may have received from other than [facility] providers in the past year (date may be approximate).     Assessment:   This is a routine wellness examination for [place].  Hearing/Vision screen Hearing Screening - Comments:: Pt HOH  Vision Screening - Comments:: Pt follows up with Dr Charise Killian for annual eye ex    Goals Addressed   None    Depression Screen    04/13/2023    8:56 AM 10/07/2022    8:19 AM 09/29/2022    9:35 AM 04/30/2022    7:57 AM 04/07/2022    9:02 AM 04/24/2021    3:19 PM 04/06/2021    8:36 AM  PHQ 2/9 Scores  PHQ - 2 Score 0 0 0 0 0 0 0    Fall Risk    05/09/2023   11:58 AM 04/13/2023    8:56 AM  10/07/2022    8:19 AM 09/29/2022    9:36 AM  04/30/2022    8:00 AM  Fall Risk   Falls in the past year? 0 0 0 0 0  Number falls in past yr: 0 0 0 0 0  Injury with Fall? 0 0 0 0 0  Risk for fall due to : No Fall Risks No Fall Risks No Fall Risks  Impaired vision  Follow up Falls prevention discussed    Falls prevention discussed    MEDICARE RISK AT HOME: Medicare Risk at Home Any stairs in or around the home?: No If so, are there any without handrails?: No Home free of loose throw rugs in walkways, pet beds, electrical cords, etc?: Yes Adequate lighting in your home to reduce risk of falls?: Yes Life alert?: No Use of a cane, walker or w/c?: No Grab bars in the bathroom?: No Shower chair or bench in shower?: No Elevated toilet seat or a handicapped toilet?: No  TIMED UP AND GO:  Was the test performed?  No    Cognitive Function:        05/09/2023   11:59 AM 04/30/2022    8:01 AM 04/24/2021    3:26 PM  6CIT Screen  What Year? 0 points 0 points 0 points  What month? 0 points 0 points 0 points  What time? 0 points 0 points 0 points  Count back from 20 0 points 0 points 0 points  Months in reverse 0 points 0 points 0 points  Repeat phrase 0 points 0 points 0 points  Total Score 0 points 0 points 0 points    Immunizations Immunization History  Administered Date(s) Administered   Fluad Quad(high Dose 65+) 02/11/2020, 02/09/2021, 01/29/2022   Influenza Split 02/22/2011, 03/14/2012, 03/16/2013, 02/08/2014, 02/22/2015   Influenza, High Dose Seasonal PF 02/21/2017, 03/02/2018, 01/17/2019, 01/18/2023   Influenza,inj,Quad PF,6+ Mos 01/21/2016   Influenza,inj,Quad PF,6-35 Mos 12/09/2020   PFIZER(Purple Top)SARS-COV-2 Vaccination 06/28/2019, 07/19/2019, 01/07/2020, 06/20/2020   Pfizer Covid-19 Vaccine Bivalent Booster 4yrs & up 03/09/2021, 02/24/2022   Pneumococcal Conjugate-13 08/22/2017   Pneumococcal Polysaccharide-23 10/24/2008, 01/26/2019   RSV,unspecified 04/12/2022   Td 09/21/2001, 10/24/2008   Tdap 03/07/2014,  03/30/2018   Zoster Recombinant(Shingrix) 03/13/2020, 06/23/2021    TDAP status: Up to date  Flu Vaccine status: Up to date  Pneumococcal vaccine status: Up to date  Covid-19 vaccine status: Information provided on how to obtain vaccines.   Qualifies for Shingles Vaccine? Yes   Zostavax completed Yes   Shingrix Completed?: Yes  Screening Tests Health Maintenance  Topic Date Due   Lung Cancer Screening  09/19/2022   COVID-19 Vaccine (7 - 2024-25 season) 01/23/2023   Medicare Annual Wellness (AWV)  05/08/2024   DTaP/Tdap/Td (5 - Td or Tdap) 03/30/2028   Colonoscopy  07/23/2028   Pneumonia Vaccine 27+ Years old  Completed   INFLUENZA VACCINE  Completed   Hepatitis C Screening  Completed   Zoster Vaccines- Shingrix  Completed   HPV VACCINES  Aged Out    Health Maintenance  Health Maintenance Due  Topic Date Due   Lung Cancer Screening  09/19/2022   COVID-19 Vaccine (7 - 2024-25 season) 01/23/2023    Colorectal cancer screening: Type of screening: Colonoscopy. Completed 07/23/21. Repeat every 7 years  Lung Cancer Screening: (Low Dose CT Chest recommended if Age 39-80 years, 20 pack-year currently smoking OR have quit w/in 15years.) does qualify.   Lung Cancer Screening Referral: pt stated he will follow up with pulmonologist  Additional Screening:  Hepatitis C Screening:  Completed 03/03/19  Vision Screening: Recommended annual ophthalmology exams for early detection of glaucoma and other disorders of the eye. Is the patient up to date with their annual eye exam?  Yes  Who is the provider or what is the name of the office in which the patient attends annual eye exams? Dr Charise Killian  If pt is not established with a provider, would they like to be referred to a provider to establish care? No .   Dental Screening: Recommended annual dental exams for proper oral hygiene   Community Resource Referral / Chronic Care Management: CRR required this visit?  No   CCM required  this visit?  No     Plan:     I have personally reviewed and noted the following in the patient's chart:   Medical and social history Use of alcohol, tobacco or illicit drugs  Current medications and supplements including opioid prescriptions. Patient is not currently taking opioid prescriptions. Functional ability and status Nutritional status Physical activity Advanced directives List of other physicians Hospitalizations, surgeries, and ER visits in previous 12 months Vitals Screenings to include cognitive, depression, and falls Referrals and appointments  In addition, I have reviewed and discussed with patient certain preventive protocols, quality metrics, and best practice recommendations. A written personalized care plan for preventive services as well as general preventive health recommendations were provided to patient.     Marzella Schlein, LPN   [id number]   After Visit Summary: (MyChart) Due to this being a telephonic visit, the after visit summary with patients personalized plan was offered to patient via MyChart   Nurse Notes: none

## 2023-05-09 NOTE — Patient Instructions (Signed)
William West , Thank you for taking time to come for your Medicare Wellness Visit. I appreciate your ongoing commitment to your health goals. Please review the following plan we discussed and let me know if I can assist you in the future.   Referrals/Orders/Follow-Ups/Clinician Recommendations: maintain health and activity   This is a list of the screening recommended for you and due dates:  Health Maintenance  Topic Date Due   Screening for Lung Cancer  09/19/2022   COVID-19 Vaccine (7 - 2024-25 season) 01/23/2023   Medicare Annual Wellness Visit  05/01/2023   DTaP/Tdap/Td vaccine (5 - Td or Tdap) 03/30/2028   Colon Cancer Screening  07/23/2028   Pneumonia Vaccine  Completed   Flu Shot  Completed   Hepatitis C Screening  Completed   Zoster (Shingles) Vaccine  Completed   HPV Vaccine  Aged Out    Advanced directives: (Copy Requested) Please bring a copy of your health care power of attorney and living will to the office to be added to your chart at your convenience.  Next Medicare Annual Wellness Visit scheduled for next year: Yes

## 2023-05-11 NOTE — Progress Notes (Signed)
Subjective:   William West is a 72 y.o. male who presents for Medicare Annual/Subsequent preventive examination.  Visit Complete: Virtual I connected with  William West on 05/11/23 by a audio enabled telemedicine application and verified that I am speaking with the correct person using two identifiers.  Patient Location: Home  Provider Location: Office/Clinic  I discussed the limitations of evaluation and management by telemedicine. The patient expressed understanding and agreed to proceed.  Vital Signs: Because this visit was a virtual/telehealth visit, some criteria may be missing or patient reported. Any vitals not documented were not able to be obtained and vitals that have been documented are patient reported.   Cardiac Risk Factors include: advanced age (>25men, >29 women);dyslipidemia;male gender     Objective:    Today's Vitals   05/09/23 1150  Weight: 200 lb (90.7 kg)   Body mass index is 27.12 kg/m.     04/30/2022    7:59 AM 04/24/2021    3:21 PM 11/22/2020    8:01 PM 09/28/2019   11:11 AM 09/20/2019    5:31 PM 07/27/2019    7:44 AM 07/24/2019    1:37 PM  Advanced Directives  Does Patient Have a Medical Advance Directive? Yes Yes No No Yes Yes Yes  Type of Estate agent of [place];Living will Healthcare Power of [employer] Power of [employer] Power of [place];Living will Living will Living will  Does patient want to make changes to medical advance directive?      No - Patient declined No - Guardian declined  Copy of Healthcare Power of Attorney in Chart? No - copy requested No - copy requested  No - copy requested     Would patient like information on creating a medical advance directive?   No - Patient declined No - Patient declined       Current Medications (verified) Outpatient Encounter Medications as of 05/09/2023  Medication Sig   acetaminophen (TYLENOL) 160 MG/5ML liquid Take 325 mg by mouth every 4 (four) hours  as needed for pain.   Ascorbic Acid (VITAMIN C) 100 MG tablet Take 100 mg by mouth daily.   augmented betamethasone dipropionate (DIPROLENE-AF) 0.05 % cream Apply topically 2 (two) times daily as needed.   azelastine (ASTELIN) 0.1 % nasal spray Place 2 sprays into both nostrils 2 (two) times daily.   Cyanocobalamin (B-12 PO) Take by mouth.   diltiazem (CARDIZEM CD) 120 MG 24 hr capsule Take 1 capsule (120 mg total) by mouth daily.   docusate sodium (COLACE) 100 MG capsule Take 100 mg by mouth as needed.    etanercept (ENBREL) 50 MG/ML injection Inject 50 mg into the skin every Monday.    famotidine (PEPCID) 20 MG tablet Take 1 tablet (20 mg total) by mouth 2 (two) times daily as needed for heartburn or indigestion. For break through of acid reflux   LORazepam (ATIVAN) 1 MG tablet Take 1 tablet (1 mg total) by mouth at bedtime.   melatonin 5 MG TABS Take 5 mg by mouth.   Multiple Vitamin (MULTIVITAMIN) tablet Take 1 tablet by mouth daily.   neomycin-bacitracin-polymyxin (NEOSPORIN) ointment Apply 1 application topically as needed for wound care.   omeprazole (PRILOSEC) 20 MG capsule Take 1 capsule (20 mg total) by mouth daily.   Probiotic Product (PROBIOTIC-10 ULTIMATE) CAPS Take by mouth as needed.   traZODone (DESYREL) 50 MG tablet Take 1-2 tablets (50-100 mg total) by mouth at bedtime as needed for sleep.  vitamin C (ASCORBIC ACID) 500 MG tablet Take 500 mg by mouth daily.   clotrimazole (LOTRIMIN) 1 % cream Apply 1 application topically daily as needed (jock itch). (Patient not taking: Reported on 05/09/2023)   ibuprofen (ADVIL,MOTRIN) 200 MG tablet Take 600 mg by mouth every 8 (eight) hours as needed for moderate pain.  (Patient not taking: Reported on 05/09/2023)   [DISCONTINUED] aspirin EC 81 MG tablet Take 81 mg by mouth daily as needed (afib).   No facility-administered encounter medications on file as of 05/09/2023.    Allergies (verified) Caffeine, Flecainide, Methotrexate  derivatives, Pseudoephedrine-ibuprofen, and Penicillins   History: Past Medical History:  Diagnosis Date   A-fib (HCC)    Acute blood loss anemia 01/26/2015   Allergic rhinitis    Diverticulosis of colon    Dysplastic nevus    GERD (gastroesophageal reflux disease)    GI bleeding 01/24/2015   Hypercholesterolemia    Postural dizziness with near syncope due to dehydration 12/09/2016   RA (rheumatoid arthritis) [facility])    Past Surgical History:  Procedure Laterality Date   COLONOSCOPY  01/24/2015   x 3   dental implant     ERCP N/A 02/18/2015   Procedure: ENDOSCOPIC RETROGRADE CHOLANGIOPANCREATOGRAPHY (ERCP);  Surgeon: Jeani Hawking, MD;  Location: [facility] ENDOSCOPY;  Service: Endoscopy;  Laterality: N/A;   GIVENS CAPSULE STUDY N/A 01/24/2015   Procedure: GIVENS CAPSULE STUDY;  Surgeon: Charna Elizabeth, MD;  Location: [facility] ENDOSCOPY;  Service: Endoscopy;  Laterality: N/A;   INGUINAL HERNIA REPAIR Right 07/27/2019   Procedure: LAPAROSCOPIC RIGHT INGUINAL HERNIA REPAIR WITH MESH;  Surgeon: Axel Filler, MD;  Location: [facility] OR;  Service: General;  Laterality: Right;   LAPAROSCOPIC CHOLECYSTECTOMY  2011   TOE AMPUTATION  1966   TONSILLECTOMY AND ADENOIDECTOMY     Family History  Problem Relation Age of Onset   Heart disease Mother    Rheum arthritis Mother    Prostate cancer Father    Colon cancer Neg Hx    Esophageal cancer Neg Hx    Rectal cancer Neg Hx    Stomach cancer Neg Hx    Social History   Socioeconomic History   Marital status: Married    Spouse name: Larita Fife   Number of children: 2   Years of education: Not on file   Highest education level: Not on file  Occupational History   Occupation: [employer]    Comment: RETIRED  Tobacco Use   Smoking status: Former    Current packs/day: 0.00    Average packs/day: 0.5 packs/day for 45.0 years (22.5 ttl pk-yrs)    Types: Cigarettes    Start date: 11/13/1964    Quit date: 11/13/2009    Years since quitting: 13.4   Smokeless tobacco:  Never  Vaping Use   Vaping status: Never Used  Substance and Sexual Activity   Alcohol use: No    Alcohol/week: 0.0 standard drinks of alcohol   Drug use: No   Sexual activity: Never  Other Topics Concern   Not on file  Social History Narrative   One son with HBP. One son with IDDM. Exercises regularly at the [facility]. Eats well. Down 30 pounds with the lifestyle change. Exercise keeps him from feeling so stiff. Weighs daily. Baseline 199-204.    Social Drivers of Corporate investment banker Strain: Low Risk  (04/30/2022)   Overall Financial Resource Strain (CARDIA)    Difficulty of Paying Living Expenses: Not hard at all  Food Insecurity: No Food Insecurity (  04/30/2022)   Hunger Vital Sign    Worried About Running Out of Food in the Last Year: Never true    Ran Out of Food in the Last Year: Never true  Transportation Needs: No Transportation Needs (04/30/2022)   PRAPARE - Administrator, Civil Service (Medical): No    Lack of Transportation (Non-Medical): No  Physical Activity: Sufficiently Active (04/30/2022)   Exercise Vital Sign    Days of Exercise per Week: 4 days    Minutes of Exercise per Session: 60 min  Stress: No Stress Concern Present (05/09/2023)   [employer] of Occupational Health - Occupational Stress Questionnaire    Feeling of Stress : Not at all  Social Connections: Socially Integrated (04/30/2022)   Social Connection and Isolation Panel [NHANES]    Frequency of Communication with Friends and Family: More than three times a week    Frequency of Social Gatherings with Friends and Family: More than three times a week    Attends Religious Services: More than 4 times per year    Active Member of [employer] or Organizations: Yes    Attends Banker Meetings: 1 to 4 times per year    Marital Status: Married    Tobacco Counseling Counseling given: Not Answered   Clinical Intake:  Pre-visit preparation completed: Yes  Pain : No/denies  pain     BMI - recorded: 27.12 Nutritional Status: BMI 25 -29 Overweight Nutritional Risks: None Diabetes: No  How often do you need to have someone help you when you read instructions, pamphlets, or other written materials from your doctor or pharmacy?: 1 - Never  Interpreter Needed?: No  Information entered by :: Lanier Ensign, LPN   Activities of Daily Living    05/09/2023   11:51 AM  In your present state of health, do you have any difficulty performing the following activities:  Hearing? 1  Comment HOH  Vision? 0  Difficulty concentrating or making decisions? 0  Walking or climbing stairs? 0  Dressing or bathing? 0  Doing errands, shopping? 0  Preparing Food and eating ? N  Using the Toilet? N  In the past six months, have you accidently leaked urine? N  Do you have problems with loss of bowel control? N  Managing your Medications? N  Managing your Finances? N  Housekeeping or managing your Housekeeping? N    Patient Care Team: Ardith Dark, MD as PCP - General (Family Medicine) Donnetta Hail, MD as Consulting Physician (Rheumatology) Charna Elizabeth, MD as Consulting Physician (Gastroenterology) Dahlia Byes, [facility] (Inactive) as Pharmacist (Pharmacist)  Indicate any recent Medical Services you may have received from other than [facility] providers in the past year (date may be approximate).     Assessment:   This is a routine wellness examination for Ridge.  Hearing/Vision screen Hearing Screening - Comments:: Pt HOH  Vision Screening - Comments:: Pt follows up with Dr Charise Killian for annual eye ex    Goals Addressed   None    Depression Screen    05/09/2023    7:52 AM 04/13/2023    8:56 AM 10/07/2022    8:19 AM 09/29/2022    9:35 AM 04/30/2022    7:57 AM 04/07/2022    9:02 AM 04/24/2021    3:19 PM  PHQ 2/9 Scores  PHQ - 2 Score 0 0 0 0 0 0 0    Fall Risk    05/09/2023   11:58 AM 04/13/2023    8:56 AM  10/07/2022    8:19 AM 09/29/2022    9:36 AM  04/30/2022    8:00 AM  Fall Risk   Falls in the past year? 0 0 0 0 0  Number falls in past yr: 0 0 0 0 0  Injury with Fall? 0 0 0 0 0  Risk for fall due to : No Fall Risks No Fall Risks No Fall Risks  Impaired vision  Follow up Falls prevention discussed    Falls prevention discussed    MEDICARE RISK AT HOME: Medicare Risk at Home Any stairs in or around the home?: No If so, are there any without handrails?: No Home free of loose throw rugs in walkways, pet beds, electrical cords, etc?: Yes Adequate lighting in your home to reduce risk of falls?: Yes Life alert?: No Use of a cane, walker or w/c?: No Grab bars in the bathroom?: No Shower chair or bench in shower?: No Elevated toilet seat or a handicapped toilet?: No  TIMED UP AND GO:  Was the test performed?  No    Cognitive Function:        05/09/2023   11:59 AM 04/30/2022    8:01 AM 04/24/2021    3:26 PM  6CIT Screen  What Year? 0 points 0 points 0 points  What month? 0 points 0 points 0 points  What time? 0 points 0 points 0 points  Count back from 20 0 points 0 points 0 points  Months in reverse 0 points 0 points 0 points  Repeat phrase 0 points 0 points 0 points  Total Score 0 points 0 points 0 points    Immunizations Immunization History  Administered Date(s) Administered   Fluad Quad(high Dose 65+) 02/11/2020, 02/09/2021, 01/29/2022   Influenza Split 02/22/2011, 03/14/2012, 03/16/2013, 02/08/2014, 02/22/2015   Influenza, High Dose Seasonal PF 02/21/2017, 03/02/2018, 01/17/2019, 01/18/2023   Influenza,inj,Quad PF,6+ Mos 01/21/2016   Influenza,inj,Quad PF,6-35 Mos 12/09/2020   PFIZER(Purple Top)SARS-COV-2 Vaccination 06/28/2019, 07/19/2019, 01/07/2020, 06/20/2020   Pfizer Covid-19 Vaccine Bivalent Booster 34yrs & up 03/09/2021, 02/24/2022   Pneumococcal Conjugate-13 08/22/2017   Pneumococcal Polysaccharide-23 10/24/2008, 01/26/2019   RSV,unspecified 04/12/2022   Td 09/21/2001, 10/24/2008   Tdap 03/07/2014,  03/30/2018   Zoster Recombinant(Shingrix) 03/13/2020, 06/23/2021    TDAP status: Up to date  Flu Vaccine status: Up to date  Pneumococcal vaccine status: Up to date  Covid-19 vaccine status: Information provided on how to obtain vaccines.   Qualifies for Shingles Vaccine? Yes   Zostavax completed Yes   Shingrix Completed?: Yes  Screening Tests Health Maintenance  Topic Date Due   Lung Cancer Screening  09/19/2022   COVID-19 Vaccine (7 - 2024-25 season) 01/23/2023   Medicare Annual Wellness (AWV)  05/08/2024   DTaP/Tdap/Td (5 - Td or Tdap) 03/30/2028   Colonoscopy  07/23/2028   Pneumonia Vaccine 77+ Years old  Completed   INFLUENZA VACCINE  Completed   Hepatitis C Screening  Completed   Zoster Vaccines- Shingrix  Completed   HPV VACCINES  Aged Out    Health Maintenance  Health Maintenance Due  Topic Date Due   Lung Cancer Screening  09/19/2022   COVID-19 Vaccine (7 - 2024-25 season) 01/23/2023    Colorectal cancer screening: Type of screening: Colonoscopy. Completed 07/23/21. Repeat every 7 years  Lung Cancer Screening: (Low Dose CT Chest recommended if Age 25-80 years, 20 pack-year currently smoking OR have quit w/in 15years.) does qualify.   Lung Cancer Screening Referral: pt stated he will follow up with pulmonologist  Additional Screening:  Hepatitis C Screening:  Completed 03/03/19  Vision Screening: Recommended annual ophthalmology exams for early detection of glaucoma and other disorders of the eye. Is the patient up to date with their annual eye exam?  Yes  Who is the provider or what is the name of the office in which the patient attends annual eye exams? Dr Charise Killian  If pt is not established with a provider, would they like to be referred to a provider to establish care? No .   Dental Screening: Recommended annual dental exams for proper oral hygiene   Community Resource Referral / Chronic Care Management: CRR required this visit?  No   CCM required  this visit?  No     Plan:     I have personally reviewed and noted the following in the patient's chart:   Medical and social history Use of alcohol, tobacco or illicit drugs  Current medications and supplements including opioid prescriptions. Patient is not currently taking opioid prescriptions. Functional ability and status Nutritional status Physical activity Advanced directives List of other physicians Hospitalizations, surgeries, and ER visits in previous 12 months Vitals Screenings to include cognitive, depression, and falls Referrals and appointments  In addition, I have reviewed and discussed with patient certain preventive protocols, quality metrics, and best practice recommendations. A written personalized care plan for preventive services as well as general preventive health recommendations were provided to patient.     Marzella Schlein, LPN   09/81/1914   After Visit Summary: (MyChart) Due to this being a telephonic visit, the after visit summary with patients personalized plan was offered to patient via MyChart   Nurse Notes: none

## 2023-08-09 ENCOUNTER — Encounter: Payer: Self-pay | Admitting: Internal Medicine

## 2023-11-09 ENCOUNTER — Encounter: Payer: Self-pay | Admitting: Internal Medicine

## 2023-11-09 ENCOUNTER — Ambulatory Visit: Admitting: Internal Medicine

## 2023-11-09 VITALS — BP 106/70 | HR 82 | Ht 71.5 in | Wt 200.5 lb

## 2023-11-09 DIAGNOSIS — Z860100 Personal history of colon polyps, unspecified: Secondary | ICD-10-CM

## 2023-11-09 DIAGNOSIS — K449 Diaphragmatic hernia without obstruction or gangrene: Secondary | ICD-10-CM

## 2023-11-09 DIAGNOSIS — K219 Gastro-esophageal reflux disease without esophagitis: Secondary | ICD-10-CM

## 2023-11-09 MED ORDER — OMEPRAZOLE 20 MG PO CPDR: 20.0000 mg | DELAYED_RELEASE_CAPSULE | Freq: Every day | ORAL | 3 refills | Status: AC

## 2023-11-09 NOTE — Progress Notes (Signed)
 Chief Complaint: GERD  HPI : 73 year old with history of RA on Enbrel, A-fib, and GERD presents for follow up of GERD  Interval History:  His acid reflux has been well controlled on omeprazole  20 mg daily. He will take Pepcid  PRN, which he usually needs once a week. He is eating and drinking well. He did became dehydrated this past Saturday while playing golf outside for 5 hours in the hot weather. When he got home, he drank Gatorade. His bowel habits took a while to normalize after this episode of dehydration, but he now currently has one BM per day. Denies chest pain, SOB, and palpitations  Wt Readings from Last 3 Encounters:  11/09/23 200 lb 8 oz (90.9 kg)  05/09/23 200 lb (90.7 kg)  04/14/23 202 lb (91.6 kg)    Past Medical History:  Diagnosis Date   A-fib (HCC)    Acute blood loss anemia 01/26/2015   Allergic rhinitis    Diverticulosis of colon    Dysplastic nevus    GERD (gastroesophageal reflux disease)    GI bleeding 01/24/2015   Hypercholesterolemia    Postural dizziness with near syncope due to dehydration 12/09/2016   RA (rheumatoid arthritis) [facility])     Past Surgical History:  Procedure Laterality Date   COLONOSCOPY  01/24/2015   x 3   dental implant     ERCP N/A 02/18/2015   Procedure: ENDOSCOPIC RETROGRADE CHOLANGIOPANCREATOGRAPHY (ERCP);  Surgeon: Alvis Jourdain, MD;  Location: [facility] ENDOSCOPY;  Service: Endoscopy;  Laterality: N/A;   GIVENS CAPSULE STUDY N/A 01/24/2015   Procedure: GIVENS CAPSULE STUDY;  Surgeon: Tami Falcon, MD;  Location: [facility] ENDOSCOPY;  Service: Endoscopy;  Laterality: N/A;   INGUINAL HERNIA REPAIR Right 07/27/2019   Procedure: LAPAROSCOPIC RIGHT INGUINAL HERNIA REPAIR WITH MESH;  Surgeon: Shela Derby, MD;  Location: [facility] OR;  Service: General;  Laterality: Right;   LAPAROSCOPIC CHOLECYSTECTOMY  2011   TOE AMPUTATION  1966   TONSILLECTOMY AND ADENOIDECTOMY     Family History  Problem Relation Age of Onset   Heart disease Mother    Rheum  arthritis Mother    Prostate cancer Father    Diabetes Son    Colon cancer Neg Hx    Esophageal cancer Neg Hx    Rectal cancer Neg Hx    Stomach cancer Neg Hx    Social History   Tobacco Use   Smoking status: Former    Current packs/day: 0.00    Average packs/day: 0.5 packs/day for 45.0 years (22.5 ttl pk-yrs)    Types: Cigarettes    Start date: 11/13/1964    Quit date: 11/13/2009    Years since quitting: 13.9   Smokeless tobacco: Never  Vaping Use   Vaping status: Never Used  Substance Use Topics   Alcohol use: No    Alcohol/week: 0.0 standard drinks of alcohol   Drug use: No   Current Outpatient Medications  Medication Sig Dispense Refill   acetaminophen  (TYLENOL ) 160 MG/5ML liquid Take 325 mg by mouth every 4 (four) hours as needed for pain.     Ascorbic Acid (VITAMIN C) 100 MG tablet Take 100 mg by mouth daily.     augmented betamethasone dipropionate (DIPROLENE-AF) 0.05 % cream Apply topically 2 (two) times daily as needed.     azelastine  (ASTELIN ) 0.1 % nasal spray Place 2 sprays into both nostrils 2 (two) times daily. 30 mL 12   clotrimazole (LOTRIMIN) 1 % cream Apply 1 application topically daily as needed (jock  itch).     Cyanocobalamin  (B-12 PO) Take by mouth.     diltiazem  (CARDIZEM  CD) 120 MG 24 hr capsule Take 1 capsule (120 mg total) by mouth daily. 90 capsule 3   docusate sodium (COLACE) 100 MG capsule Take 100 mg by mouth as needed.      etanercept (ENBREL) 50 MG/ML injection Inject 50 mg into the skin every Monday.      famotidine  (PEPCID ) 20 MG tablet Take 1 tablet (20 mg total) by mouth 2 (two) times daily as needed for heartburn or indigestion. For break through of acid reflux 180 tablet 1   ibuprofen (ADVIL,MOTRIN) 200 MG tablet Take 600 mg by mouth every 8 (eight) hours as needed for moderate pain.      LORazepam  (ATIVAN ) 1 MG tablet Take 1 tablet (1 mg total) by mouth at bedtime. 30 tablet 5   melatonin 5 MG TABS Take 5 mg by mouth.     Multiple Vitamin  (MULTIVITAMIN) tablet Take 1 tablet by mouth daily.     neomycin -bacitracin-polymyxin (NEOSPORIN) ointment Apply 1 application topically as needed for wound care.     omeprazole  (PRILOSEC) 20 MG capsule Take 1 capsule (20 mg total) by mouth daily. 90 capsule 3   Probiotic Product (PROBIOTIC-10 ULTIMATE) CAPS Take by mouth as needed.     traZODone  (DESYREL ) 50 MG tablet Take 1-2 tablets (50-100 mg total) by mouth at bedtime as needed for sleep. 90 tablet 3   No current facility-administered medications for this visit.   Allergies  Allergen Reactions   Caffeine Other (See Comments)    Causes him to go into afib   Flecainide  Other (See Comments)    Caused heart to race too fast and had him in the ED   Methotrexate Derivatives Nausea And Vomiting    Increased liver enzymes   Pseudoephedrine-Ibuprofen Other (See Comments)     afib    Penicillins Rash    Did it involve swelling of the face/tongue/throat, SOB, or low BP? No Did it involve sudden or severe rash/hives, skin peeling, or any reaction on the inside of your mouth or nose? No Did you need to seek medical attention at a hospital or doctor's office? No When did it last happen?      40 + years If all above answers are "NO", may proceed with cephalosporin use.     Physical Exam: BP 106/70   Pulse 82   Ht 5' 11.5 (1.816 m)   Wt 200 lb 8 oz (90.9 kg)   SpO2 98%   BMI 27.57 kg/m  Constitutional: Pleasant,well-developed, male in no acute distress. HEENT: Normocephalic and atraumatic. Conjunctivae are normal. No scleral icterus. Cardiovascular: Irregularly irregular Pulmonary/chest: Effort normal. No wheezing, rales or rhonchi. Abdominal: Soft, nondistended, nontender Extremities: No edema Neurological: Alert and oriented to person place and time. Skin: Skin is warm and dry. No rashes noted. Psychiatric: Normal mood and affect. Behavior is normal.  Labs 03/2021: CBC and CMP unremarkable.  Labs 03/2022: CBC and CMP nml.  Vit D nml. Vit B12 nml. TSH nml. HbA1C 4.8%.   Labs 09/2022: CBC nml. CRP and ESR nml. Vit B12 nml.   Labs 03/2023: CMP with Cr of 1.39. CBC nml. HbA1C 5%. TSH nml.   CT A/P w/contrast 09/06/17: Abdomen / Pelvis Impression: 1. Pneumobilia within the LEFT hepatic lobe is presumed related to prior sphincterotomy. 2. Sigmoid diverticulosis without diverticulitis. 3. Moderate size fat filled LEFT inguinal hernia.  Colonoscopy 06/08/04: Small internal hemorrhoid. Small sessile  polyp biopsied from rectosigmoid colon. Scattered sigmoid diverticulosis. Otherwise normal colonoscopy up to the cecum.  Path: Fragments of benign mucosa.  Colonoscopy 03/25/10:  Path: Fragments of hyperplastic polyp  EGD 01/24/15:   Colonoscopy 01/24/15:   VCE 01/24/15: Gastric polyps. Normal small bowel mucosa for 1st 4 hours of the study. Very dark stool encountered in the distal small bowel from 4 hours until cecal images at 6 hour 41 minutes. Dark stool in the right colon totally obscuring colon images.   ERCP 02/18/15: IMPRESSIONS: 1) Choledocholithiasis s/p successful stone extraction.  EGD 07/23/21:  Path: 1. Surgical [P], duodenum biopsies - DUODENAL MUCOSA WITH NO SPECIFIC HISTOPATHOLOGIC CHANGES - NEGATIVE FOR INCREASED INTRAEPITHELIAL LYMPHOCYTES OR VILLOUS ARCHITECTURAL CHANGES 2. Surgical [P], gastric biopsies - GASTRIC ANTRAL MUCOSA WITH MILD NONSPECIFIC REACTIVE GASTROPATHY - GASTRIC OXYNTIC MUCOSA WITH PARIETAL CELL HYPERPLASIA AS CAN BE SEEN IN HYPERGASTRINEMIC STATES SUCH AS PPI THERAPY. - HELICOBACTER PYLORI-LIKE ORGANISMS ARE NOT IDENTIFIED ON ROUTINE H&E STAIN 3. Surgical [P], gastric polyp biopsies, polyp (1) - FUNDIC GLAND POLYP(S) - NEGATIVE FOR DYSPLASIA 4. Surgical [P], random esophagus - ESOPHAGEAL SQUAMOUS MUCOSA WITH NO SPECIFIC HISTOPATHOLOGIC CHANGES - NEGATIVE FOR INCREASED INTRAEPITHELIAL EOSINOPHILS  Colonoscopy 07/24/21:  Path: 5. Surgical [P], colon, ascending and cecum,  polyp (2) - TUBULAR ADENOMA(S) - NEGATIVE FOR HIGH-GRADE DYSPLASIA OR MALIGNANCY  ASSESSMENT AND PLAN:  GERD Hiatal hernia History of colon polyps Patient was able to tolerate dropping down to the omeprazole  20 mg daily and now uses famotidine  PRN for breakthrough acid reflux once a week. He has otherwise been doing well.  - Previously gave low FODMAP diet handout - Continue omeprazole  20 mg QD - Continue famotidine  20 mg BID PRN - Consider repeat colonoscopy 07/2028 for polyps surveillance depending on age and comorbidities - RTC 1 year   Regino Caprio, MD  I spent 25 minutes of time, including in depth chart review, independent review of results as outlined above, communicating results with the patient directly, face-to-face time with the patient, coordinating care, ordering studies and medications as appropriate, and documentation.

## 2023-11-09 NOTE — Patient Instructions (Signed)
 We have sent the following medications to your pharmacy for you to pick up at your convenience: Omeprazole    Follow up in 1 year    If your blood pressure at your visit was 140/90 or greater, please contact your primary care physician to follow up on this.  _______________________________________________________  If you are age 72 or older, your body mass index should be between 23-30. Your Body mass index is 27.57 kg/m. If this is out of the aforementioned range listed, please consider follow up with your Primary Care Provider.  If you are age 93 or younger, your body mass index should be between 19-25. Your Body mass index is 27.57 kg/m. If this is out of the aformentioned range listed, please consider follow up with your Primary Care Provider.   ________________________________________________________  The [facility] providers would like to encourage you to use MYCHART to communicate with providers for non-urgent requests or questions.  Due to long hold times on the telephone, sending your provider a message by [facility] may be a faster and more efficient way to get a response.  Please allow 48 business hours for a response.  Please remember that this is for non-urgent requests.  _______________________________________________________  Thank you for entrusting me with your care and for choosing [facility], Dr. Regino Caprio

## 2023-11-17 ENCOUNTER — Ambulatory Visit: Admitting: Family Medicine

## 2023-11-17 ENCOUNTER — Encounter: Payer: Self-pay | Admitting: Family Medicine

## 2023-11-17 VITALS — BP 109/70 | HR 91 | Temp 97.9°F | Ht 71.5 in | Wt 200.8 lb

## 2023-11-17 DIAGNOSIS — R059 Cough, unspecified: Secondary | ICD-10-CM | POA: Diagnosis not present

## 2023-11-17 DIAGNOSIS — I480 Paroxysmal atrial fibrillation: Secondary | ICD-10-CM

## 2023-11-17 DIAGNOSIS — J301 Allergic rhinitis due to pollen: Secondary | ICD-10-CM | POA: Diagnosis not present

## 2023-11-17 LAB — POC COVID19 BINAXNOW: SARS Coronavirus 2 Ag: NEGATIVE

## 2023-11-17 MED ORDER — AZITHROMYCIN 250 MG PO TABS: ORAL_TABLET | ORAL | 0 refills | Status: DC

## 2023-11-17 MED ORDER — MEASLES, MUMPS & RUBELLA VAC IJ SOLR: 0.5000 mL | Freq: Once | INTRAMUSCULAR | 0 refills | Status: AC

## 2023-11-17 MED ORDER — PREDNISONE 20 MG PO TABS: 20.0000 mg | ORAL_TABLET | Freq: Every day | ORAL | 0 refills | Status: DC

## 2023-11-17 NOTE — Assessment & Plan Note (Signed)
 Follows with cardiology.  Irregular today.  On Cardizem  per cardiology.  CHA2DS2-VASc or is 1-not on anticoagulation.

## 2023-11-17 NOTE — Assessment & Plan Note (Signed)
 May be contributing to above sinusitis.  He will continue the Astelin  and Flonase .

## 2023-11-17 NOTE — Progress Notes (Signed)
   William West is a 73 y.o. male who presents today for an office visit.  Assessment/Plan:  New/Acute Problems: Sinusitis  Covid test negative. Likely viral URI. He is having some improvement the last day or so with conservative measures. It is ok for him to continue with over-the-counter medications.  Will send in pocket prescription for azithromycin  and prednisone .  Likely does have mild underlying COPD due to prior history of smoker though overall reassuring exam today.  We encouraged hydration.  He will let us  know if not improving in the next several days.  Chronic Problems Addressed Today: Afib (HCC) Follows with cardiology.  Irregular today.  On Cardizem  per cardiology.  CHA2DS2-VASc or is 1-not on anticoagulation.  Allergic rhinitis May be contributing to above sinusitis.  He will continue the Astelin  and Flonase .    Preventative Healthcare Patient had measles as a child but has never received measles vaccine.  He is interested in getting measles vaccine today.  Discussed with him not sure if insurance will pay for this however I will send a prescription to his pharmacy to see if they can run it through his insurance there.    Subjective:  HPI:  See A/P for status of chronic conditions.  Patient is here today with cough and wheeze for the last few days.  He has been using astelin  with some improvement. He has been trying to stay well hydrated. No fevers or chills. No shortness of breath. No chest pain.        Objective:  Physical Exam: BP 109/70   Pulse 91   Temp 97.9 F (36.6 C) (Temporal)   Ht 5' 11.5 (1.816 m)   Wt 200 lb 12.8 oz (91.1 kg)   SpO2 97%   BMI 27.62 kg/m   Gen: No acute distress, resting comfortably HEENT: TMs clear.  OP clear. CV: Irregular rate and rhythm with no murmurs appreciated Pulm: Normal work of breathing, clear to auscultation bilaterally with no crackles, wheezes, or rhonchi Neuro: Grossly normal, moves all extremities Psych: Normal  affect and thought content      Delonda Coley M. Kennyth, MD 11/17/2023 1:50 PM

## 2023-11-17 NOTE — Patient Instructions (Signed)
 It was very nice to see you today!  You have an upper respiratory infection.  Please continue the medications that you are taking.  Start the azithromycin  and prednisone  if not improving.  I will send the measles vaccine to the pharmacy to see if I can get this to you there.  Return if symptoms worsen or fail to improve.   Take care, Dr Kennyth  PLEASE NOTE:  If you had any lab tests, please let us  know if you have not heard back within a few days. You may see your results on mychart before we have a chance to review them but we will give you a call once they are reviewed by us .   If we ordered any referrals today, please let us  know if you have not heard from their office within the next week.   If you had any urgent prescriptions sent in today, please check with the pharmacy within an hour of our visit to make sure the prescription was transmitted appropriately.   Please try these tips to maintain a healthy lifestyle:  Eat at least 3 REAL meals and 1-2 snacks per day.  Aim for no more than 5 hours between eating.  If you eat breakfast, please do so within one hour of getting up.   Each meal should contain half fruits/vegetables, one quarter protein, and one quarter carbs (no bigger than a computer mouse)  Cut down on sweet beverages. This includes juice, soda, and sweet tea.   Drink at least 1 glass of water with each meal and aim for at least 8 glasses per day  Exercise at least 150 minutes every week.

## 2023-11-23 ENCOUNTER — Other Ambulatory Visit: Payer: Self-pay | Admitting: Family Medicine

## 2023-11-23 MED ORDER — NIRMATRELVIR/RITONAVIR (PAXLOVID)TABLET: 2.0000 | ORAL_TABLET | Freq: Two times a day (BID) | ORAL | 0 refills | Status: AC

## 2023-11-23 NOTE — Progress Notes (Signed)
 Patient was seen last week for URI symptoms.  COVID test at that time was negative.  We saw his wife in clinic today and had positive COVID test.  He is currently feeling much improved and has not had any worsening of symptoms.  We will send in a pocket prescription for paxlovid for him with instruction to not start unless he develops any worsening signs or symptoms of COVID infection.

## 2024-03-31 ENCOUNTER — Other Ambulatory Visit: Payer: Self-pay | Admitting: Family Medicine

## 2024-03-31 ENCOUNTER — Other Ambulatory Visit: Payer: Self-pay | Admitting: Internal Medicine

## 2024-04-12 ENCOUNTER — Ambulatory Visit: Admitting: Emergency Medicine

## 2024-04-12 ENCOUNTER — Encounter: Payer: Self-pay | Admitting: Emergency Medicine

## 2024-04-12 VITALS — BP 107/73 | HR 79 | Temp 97.8°F | Ht 71.5 in | Wt 203.0 lb

## 2024-04-12 DIAGNOSIS — R911 Solitary pulmonary nodule: Secondary | ICD-10-CM | POA: Diagnosis not present

## 2024-04-12 NOTE — Patient Instructions (Signed)
 I am glad that you have been doing well. Continue to follow with rheumatology as planned. We will repeat your CT scan of the chest in April 2026 for surveillance of your pulmonary nodules. Follow Dr. Shelah in April 2026 after that CT so we can review the results together.

## 2024-04-12 NOTE — Progress Notes (Signed)
 Subjective:    Patient ID: William West, male    DOB: 1951/01/16, 73 y.o.   MRN: [id number]  HPI  ROV 10/06/22 --William West is 73 and has a history of mild COPD, rheumatoid arthritis on immunosuppression (currently on Enbrel), atrial fibrillation.  He has stable pulmonary nodular disease that has been ascribed to his rheumatoid arthritis.  He had some confusing results with negative, then positive then negative QuantiFERON gold in March 2023 (done as surveillance while on Enbrel).  After the most recent negative QuantiFERON gold 07/2021 we decided to defer treatment for possible latent TB. Hasn't been repeated.  He is not on any scheduled bronchodilator therapy. He reports that he had some sinus congestion and drainage, R ear ache. Was started on astelin , azithro. Somewhat better, but still R ear fullness. He has no breathing limitations, goes to the gym 3 days a week. Walks a lot, still plays golf. He does get some R hand stiffness  CT chest 09/18/2021 reviewed by me shows no mediastinal or hilar lymphadenopathy, a stable left lower lobe 8 x 7 mm pulmonary nodule now with some calcification consistent with benign partially calcified granuloma.  No other nodules noted.  Labs: QuantiFERON gold 07/21/2020 >> negative QuantiFERON gold 07/22/2021 >> positive QuantiFERON gold 08/05/2021 >> positive QuantiFERON gold 08/21/2021 >> negative   ROV 04/12/2024 --follow-up visit 73 year old gentleman with history of rheumatoid arthritis on Imuran suppression (Enbrel), mild COPD, atrial fibrillation.  I have followed him for stable pulmonary nodular disease that we have suspected was related to his RA.  He has had some confusing QuantiFERON gold results since have known him (as surveillance while on immunosuppression), most recently negative.  We deferred any treatment for possible latent TB. He follows with Dr Mai and Rosaline Neysa Stark PA.  He has some intermittent foot and ankle pain, has to take some  NSAIDS although he tries to avoid as he has had GIB before. He still goes to the gym. No dyspnea. No real cough currently.     Review of Systems As per HPI     Objective:   Physical Exam Vitals:   04/12/24 0938  BP: 107/73  Pulse: 79  Temp: 97.8 F (36.6 C)  TempSrc: Oral  SpO2: 96%  Weight: 203 lb (92.1 kg)  Height: 5' 11.5 (1.816 m)    Gen: Pleasant, well-nourished, in no distress,  normal affect  ENT: No lesions,  mouth clear,  oropharynx clear, no postnasal drip  Neck: No JVD, no stridor  Lungs: No use of accessory muscles, no crackles or wheezing on normal respiration, no wheeze on forced expiration  Cardiovascular: RRR, heart sounds normal, no murmur or gallops, no peripheral edema  Musculoskeletal: No deformities, no cyanosis or clubbing  Neuro: alert, awake, non focal  Skin: Warm, no lesions or rash      Assessment & Plan:  Pulmonary nodule, left Has overall been doing very well.  He is managed by rheumatology for his rheumatoid arthritis.  He has not had a QuantiFERON gold test anytime recently, note his disparate results in the past.  He has not had a repeat CT since April 2023.  We will plan to do a surveillance CT in April 2026 which would be a 3-year interval.  Suspect that there will not be a significant change since his RA has been well controlled.  Doubt any evidence for opportunistic infection given his overall clinical stability.    Lamar Chris, MD, PhD 04/12/2024, 10:05 AM [facility] [phone] or if no answer [phone]

## 2024-04-12 NOTE — Assessment & Plan Note (Signed)
 Has overall been doing very well.  He is managed by rheumatology for his rheumatoid arthritis.  He has not had a QuantiFERON gold test anytime recently, note his disparate results in the past.  He has not had a repeat CT since April 2023.  We will plan to do a surveillance CT in April 2026 which would be a 3-year interval.  Suspect that there will not be a significant change since his RA has been well controlled.  Doubt any evidence for opportunistic infection given his overall clinical stability.

## 2024-04-16 ENCOUNTER — Ambulatory Visit: Payer: Medicare Other | Admitting: Family Medicine

## 2024-04-16 ENCOUNTER — Encounter: Payer: Self-pay | Admitting: Family Medicine

## 2024-04-16 VITALS — BP 128/88 | HR 76 | Temp 97.5°F | Ht 71.5 in | Wt 200.6 lb

## 2024-04-16 DIAGNOSIS — R399 Unspecified symptoms and signs involving the genitourinary system: Secondary | ICD-10-CM | POA: Diagnosis not present

## 2024-04-16 DIAGNOSIS — E785 Hyperlipidemia, unspecified: Secondary | ICD-10-CM | POA: Diagnosis not present

## 2024-04-16 DIAGNOSIS — M7740 Metatarsalgia, unspecified foot: Secondary | ICD-10-CM

## 2024-04-16 DIAGNOSIS — Z125 Encounter for screening for malignant neoplasm of prostate: Secondary | ICD-10-CM | POA: Diagnosis not present

## 2024-04-16 DIAGNOSIS — F5101 Primary insomnia: Secondary | ICD-10-CM

## 2024-04-16 DIAGNOSIS — E538 Deficiency of other specified B group vitamins: Secondary | ICD-10-CM | POA: Diagnosis not present

## 2024-04-16 DIAGNOSIS — M069 Rheumatoid arthritis, unspecified: Secondary | ICD-10-CM | POA: Diagnosis not present

## 2024-04-16 DIAGNOSIS — R739 Hyperglycemia, unspecified: Secondary | ICD-10-CM

## 2024-04-16 DIAGNOSIS — Z0001 Encounter for general adult medical examination with abnormal findings: Secondary | ICD-10-CM

## 2024-04-16 LAB — CBC
HCT: 43.4 % (ref 39.0–52.0)
Hemoglobin: 14.9 g/dL (ref 13.0–17.0)
MCHC: 34.4 g/dL (ref 30.0–36.0)
MCV: 87.5 fl (ref 78.0–100.0)
Platelets: 237 K/uL (ref 150.0–400.0)
RBC: 4.96 Mil/uL (ref 4.22–5.81)
RDW: 14.2 % (ref 11.5–15.5)
WBC: 5.9 K/uL (ref 4.0–10.5)

## 2024-04-16 LAB — COMPREHENSIVE METABOLIC PANEL WITH GFR
ALT: 13 U/L (ref 0–53)
AST: 18 U/L (ref 0–37)
Albumin: 4.6 g/dL (ref 3.5–5.2)
Alkaline Phosphatase: 56 U/L (ref 39–117)
BUN: 13 mg/dL (ref 6–23)
CO2: 30 meq/L (ref 19–32)
Calcium: 9.5 mg/dL (ref 8.4–10.5)
Chloride: 106 meq/L (ref 96–112)
Creatinine, Ser: 1.23 mg/dL (ref 0.40–1.50)
GFR: 58.54 mL/min — ABNORMAL LOW (ref >60.00)
Glucose, Bld: 100 mg/dL — ABNORMAL HIGH (ref 70–99)
Potassium: 3.9 meq/L (ref 3.5–5.1)
Sodium: 143 meq/L (ref 135–145)
Total Bilirubin: 1 mg/dL (ref 0.2–1.2)
Total Protein: 6.7 g/dL (ref 6.0–8.3)

## 2024-04-16 LAB — LIPID PANEL
Cholesterol: 141 mg/dL (ref 0–200)
HDL: 50 mg/dL (ref >39.00)
LDL Cholesterol: 78 mg/dL (ref 0–99)
NonHDL: 91.43
Total CHOL/HDL Ratio: 3
Triglycerides: 68 mg/dL (ref 0.0–149.0)
VLDL: 13.6 mg/dL (ref 0.0–40.0)

## 2024-04-16 LAB — VITAMIN B12: Vitamin B-12: 832 pg/mL (ref 211–911)

## 2024-04-16 LAB — HEMOGLOBIN A1C: Hgb A1c MFr Bld: 4.8 % (ref 4.6–6.5)

## 2024-04-16 LAB — PSA: PSA: 1.29 ng/mL (ref 0.10–4.00)

## 2024-04-16 LAB — TSH: TSH: 2.31 u[IU]/mL (ref 0.35–5.50)

## 2024-04-16 NOTE — Assessment & Plan Note (Signed)
Check A1C with labs

## 2024-04-16 NOTE — Assessment & Plan Note (Signed)
Follows with rheumatology.  On Enbrel.

## 2024-04-16 NOTE — Assessment & Plan Note (Signed)
 No red flags.  History exam consistent with metatarsalgia.  He has had improved with modifying footwear.  He cannot take NSAIDs due to his medical history however it is okay for him to take Tylenol  as needed.   we did discuss imaging vs referral sports medicine however given improvement in symptoms do not think this is necessary at this point.  He will let us  know if he has any return of symptoms.

## 2024-04-16 NOTE — Assessment & Plan Note (Signed)
 Check labs.  Discussed lifestyle modifications.

## 2024-04-16 NOTE — Progress Notes (Signed)
 Chief Complaint:  William West is a 73 y.o. male who presents today for his annual comprehensive physical exam.    Assessment/Plan:  Chronic Problems Addressed Today: Metatarsalgia No red flags.  History exam consistent with metatarsalgia.  He has had improved with modifying footwear.  He cannot take NSAIDs due to his medical history however it is okay for him to take Tylenol  as needed.   we did discuss imaging vs referral sports medicine however given improvement in symptoms do not think this is necessary at this point.  He will let us  know if he has any return of symptoms.  Dyslipidemia Check labs.  Discussed lifestyle modifications.  Rheumatoid disease (HCC) Follows with rheumatology.  On Enbrel.  Insomnia On trazodone  as needed.  Does not need refill today.  Hyperglycemia Check A 1C with labs.  Lower urinary tract symptoms (LUTS) Mild BPH.  Check PSA.  DRE today without significant abnormalities.  Symptoms are overall manageable.  Low serum vitamin B12 Check B12 with labs.  Preventative Healthcare: Check labs.  Up-to-date on colon cancer screening.  Follows with pulmonology for lung cancer screening.  Patient Counseling(The following topics were reviewed and/or handout was given):  -Nutrition: Stressed importance of moderation in sodium/caffeine intake, saturated fat and cholesterol, caloric balance, sufficient intake of fresh fruits, vegetables, and fiber.  -Stressed the importance of regular exercise.   -Substance Abuse: Discussed cessation/primary prevention of tobacco, alcohol, or other drug use; driving or other dangerous activities under the influence; availability of treatment for abuse.   -Injury prevention: Discussed safety belts, safety helmets, smoke detector, smoking near bedding or upholstery.   -Sexuality: Discussed sexually transmitted diseases, partner selection, use of condoms, avoidance of unintended pregnancy and contraceptive alternatives.   -Dental  health: Discussed importance of regular tooth brushing, flossing, and dental visits.  -Health maintenance and immunizations reviewed. Please refer to Health maintenance section.  Return to care in 1 year for next preventative visit.     Subjective:  HPI:  Patient is here today for his annual physical.  See assessment / plan for status of chronic conditions.  Discussed the use of AI scribe software for clinical note transcription with the patient, who gave verbal consent to proceed.  History of Present Illness William West is a 73 year old male who presents for an annual physical exam.  He experiences pain in his right foot, particularly in the toes, which worsens with driving due to pressure on the gas pedal. He uses inexpensive shoes but recently added inserts, providing some relief. The pain is not present currently but has been severe enough to impact activities like golf, where both feet hurt after a short period. The pain in the ball of his foot occurs on both feet and can be severe, as experienced during a golf outing a month ago. Dr. Heriberto inserts have been used for about a week, significantly alleviating the pain.  He has a history of rheumatoid arthritis and notes significant shoulder pain when off Enbrel for a month due to hernia surgery. He describes general stiffness and pain throughout his body, particularly after long car rides, attributing it to age and arthritis. Exercise, such as walking on a treadmill and light weightlifting, provides relief, which he does regularly at a gym.  He recalls a past episode of wheezing during the summer when his wife tested positive for COVID-19, although he never tested positive himself. He has no current respiratory symptoms and denies shortness of breath.  He is currently  taking trazodone  for sleep and lorazepam  as needed, though he rarely uses the latter. He does not require refills at this time.       04/16/2024    8:18 AM   Depression screen PHQ 2/9  Decreased Interest 0  Down, Depressed, Hopeless 0  PHQ - 2 Score 0    Health Maintenance Due  Topic Date Due   Lung Cancer Screening  09/19/2022   Medicare Annual Wellness (AWV)  05/08/2024     ROS: Per HPI, otherwise a complete review of systems was negative.   PMH:  The following were reviewed and entered/updated in epic: Past Medical History:  Diagnosis Date   A-fib (HCC)    Acute blood loss anemia 01/26/2015   Allergic rhinitis    Diverticulosis of colon    Dysplastic nevus    GERD (gastroesophageal reflux disease)    GI bleeding 01/24/2015   Hypercholesterolemia    Postural dizziness with near syncope due to dehydration 12/09/2016   RA (rheumatoid arthritis) [facility])    Patient Active Problem List   Diagnosis Date Noted   Metatarsalgia 04/16/2024   Low serum vitamin B12 04/09/2022   Lower urinary tract symptoms (LUTS) 04/07/2022   TB lung, latent 08/21/2021   Hyperglycemia 04/05/2019   Family history of prostate cancer 04/03/2019   GERD (gastroesophageal reflux disease), on Omeprazole  07/01/2018   History of GI bleed 07/01/2018   Insomnia 07/01/2018   Abnormal CT of the chest 03/03/2018   Sensorineural hearing loss (SNHL), bilateral 12/28/2017   Tinnitus, bilateral 12/28/2017   Pulmonary nodule, left 12/09/2016   Former smoker 01/16/2015   Afib (HCC) 04/25/2012   Rheumatoid disease (HCC) 01/06/2011   Allergic rhinitis 04/22/2009   Dyslipidemia 10/24/2008   Diverticulosis of large intestine 06/21/2007   Past Surgical History:  Procedure Laterality Date   COLONOSCOPY  01/24/2015   x 3   dental implant     ERCP N/A 02/18/2015   Procedure: ENDOSCOPIC RETROGRADE CHOLANGIOPANCREATOGRAPHY (ERCP);  Surgeon: Belvie Just, MD;  Location: [facility];  Service: Endoscopy;  Laterality: N/A;   GIVENS CAPSULE STUDY N/A 01/24/2015   Procedure: GIVENS CAPSULE STUDY;  Surgeon: Renaye Sous, MD;  Location: [facility];  Service: Endoscopy;   Laterality: N/A;   INGUINAL HERNIA REPAIR Right 07/27/2019   Procedure: LAPAROSCOPIC RIGHT INGUINAL HERNIA REPAIR WITH MESH;  Surgeon: Rubin Calamity, MD;  Location: [facility] OR;  Service: General;  Laterality: Right;   LAPAROSCOPIC CHOLECYSTECTOMY  2011   TOE AMPUTATION  1966   TONSILLECTOMY AND ADENOIDECTOMY      Family History  Problem Relation Age of Onset   Heart disease Mother    Rheum arthritis Mother    Prostate cancer Father    Diabetes Son    Colon cancer Neg Hx    Esophageal cancer Neg Hx    Rectal cancer Neg Hx    Stomach cancer Neg Hx     Medications- reviewed and updated Current Outpatient Medications  Medication Sig Dispense Refill   acetaminophen  (TYLENOL ) 160 MG/5ML liquid Take 325 mg by mouth every 4 (four) hours as needed for pain.     azelastine  (ASTELIN ) 0.1 % nasal spray Place 2 sprays into both nostrils 2 (two) times daily. 30 mL 12   Cyanocobalamin  (B-12 PO) Take by mouth.     diltiazem  (CARDIZEM  CD) 120 MG 24 hr capsule Take 1 capsule (120 mg total) by mouth daily. 90 capsule 3   docusate sodium (COLACE) 100 MG capsule Take 100 mg by mouth as needed.  etanercept (ENBREL) 50 MG/ML injection Inject 50 mg into the skin every Monday.      famotidine  (PEPCID ) 20 MG tablet TAKE 1 TABLET BY MOUTH TWICE DAILY AS NEEDED FOR  HEARTBURN  OR  INDIGESTION.  FOR  BREAK  THROUGH  OF  ACID  REFLUX 180 tablet 0   ibuprofen (ADVIL,MOTRIN) 200 MG tablet Take 600 mg by mouth every 8 (eight) hours as needed for moderate pain.      LORazepam  (ATIVAN ) 1 MG tablet Take 1 tablet (1 mg total) by mouth at bedtime. 30 tablet 5   magnesium  gluconate (MAGONATE) 500 (27 Mg) MG TABS tablet Take 250 mg by mouth daily.     melatonin 5 MG TABS Take 5 mg by mouth.     Multiple Vitamin (MULTIVITAMIN) tablet Take 1 tablet by mouth daily.     neomycin -bacitracin-polymyxin (NEOSPORIN) ointment Apply 1 application topically as needed for wound care.     omeprazole  (PRILOSEC) 20 MG capsule Take 1  capsule (20 mg total) by mouth daily. 90 capsule 3   Probiotic Product (PROBIOTIC-10 ULTIMATE) CAPS Take by mouth as needed.     traZODone  (DESYREL ) 50 MG tablet TAKE 1 TO 2 TABLETS BY MOUTH AT BEDTIME AS NEEDED FOR SLEEP 90 tablet 0   No current facility-administered medications for this visit.    Allergies-reviewed and updated Allergies  Allergen Reactions   Caffeine Other (See Comments)    Causes him to go into afib   Flecainide  Other (See Comments)    Caused heart to race too fast and had him in the ED   Methotrexate And Trimetrexate Nausea And Vomiting    Increased liver enzymes   Pseudoephedrine-Ibuprofen Other (See Comments)     afib    Penicillins Rash    Did it involve swelling of the face/tongue/throat, SOB, or low BP? No Did it involve sudden or severe rash/hives, skin peeling, or any reaction on the inside of your mouth or nose? No Did you need to seek medical attention at a hospital or doctor's office? No When did it last happen?      40 + years If all above answers are "NO", may proceed with cephalosporin use.     Social History   Socioeconomic History   Marital status: Married    Spouse name: Macario   Number of children: 2   Years of education: Not on file   Highest education level: Not on file  Occupational History   Occupation: [employer]    Comment: RETIRED  Tobacco Use   Smoking status: Former    Current packs/day: 0.00    Average packs/day: 0.5 packs/day for 45.0 years (22.5 ttl pk-yrs)    Types: Cigarettes    Start date: 11/13/1964    Quit date: 11/13/2009    Years since quitting: 14.4   Smokeless tobacco: Never  Vaping Use   Vaping status: Never Used  Substance and Sexual Activity   Alcohol use: No    Alcohol/week: 0.0 standard drinks of alcohol   Drug use: No   Sexual activity: Never  Other Topics Concern   Not on file  Social History Narrative   One son with HBP. One son with IDDM. Exercises regularly at the [facility]. Eats well. Down 30 pounds  with the lifestyle change. Exercise keeps him from feeling so stiff. Weighs daily. Baseline 199-204.    Social Drivers of Health   Financial Resource Strain: Low Risk  (04/30/2022)   Overall Financial Resource Strain (CARDIA)  Difficulty of Paying Living Expenses: Not hard at all  Food Insecurity: No Food Insecurity (04/30/2022)   Hunger Vital Sign    Worried About Running Out of Food in the Last Year: Never true    Ran Out of Food in the Last Year: Never true  Transportation Needs: No Transportation Needs (04/30/2022)   PRAPARE - Administrator, Civil Service (Medical): No    Lack of Transportation (Non-Medical): No  Physical Activity: Sufficiently Active (04/30/2022)   Exercise Vital Sign    Days of Exercise per Week: 4 days    Minutes of Exercise per Session: 60 min  Stress: No Stress Concern Present (05/09/2023)   [employer] of Occupational Health - Occupational Stress Questionnaire    Feeling of Stress : Not at all  Social Connections: Socially Integrated (04/30/2022)   Social Connection and Isolation Panel    Frequency of Communication with Friends and Family: More than three times a week    Frequency of Social Gatherings with Friends and Family: More than three times a week    Attends Religious Services: More than 4 times per year    Active Member of [employer] or Organizations: Yes    Attends Banker Meetings: 1 to 4 times per year    Marital Status: Married        Objective:  Physical Exam: BP 128/88   Pulse 76   Temp (!) 97.5 F (36.4 C) (Temporal)   Ht 5' 11.5 (1.816 m)   Wt 200 lb 9.6 oz (91 kg)   SpO2 98%   BMI 27.59 kg/m   Body mass index is 27.59 kg/m. Wt Readings from Last 3 Encounters:  04/16/24 200 lb 9.6 oz (91 kg)  04/12/24 203 lb (92.1 kg)  11/17/23 200 lb 12.8 oz (91.1 kg)   Gen: NAD, resting comfortably HEENT: TMs normal bilaterally. OP clear. No thyromegaly noted.  CV: Irregular with no murmurs appreciated Pulm:  NWOB, CTAB with no crackles, wheezes, or rhonchi GI: Normal bowel sounds present. Soft, Nontender, Nondistended. GU: No abnormalities. Mild prostatic enlargment. No prostate nodules noted on DRE MSK:  - Feet: Loss of transverse arches noted bilaterally.  Hammertoe deformity noted bilaterally.  Tenderness with patient at head of 2nd and 3rd metatarsal and right. Skin: warm, dry Neuro: CN2-12 grossly intact. Strength 5/5 in upper and lower extremities. Reflexes symmetric and intact bilaterally.  Psych: Normal affect and thought content     Lawrance Wiedemann M. Kennyth, MD 04/16/2024 8:58 AM

## 2024-04-16 NOTE — Assessment & Plan Note (Addendum)
 Check B12 with labs.

## 2024-04-16 NOTE — Patient Instructions (Addendum)
 It was very nice to see you today!  VISIT SUMMARY: You came in for your annual physical exam. We discussed your foot pain, rheumatoid arthritis, and overall wellness. Blood work was ordered, and your vaccinations and colonoscopy are up to date.  YOUR PLAN: METATARSALGIA, RIGHT FOOT: You have intermittent pain in your right foot, which has been improving with the use of arch support inserts. -Continue using the arch support inserts. -Consider upgrading your footwear for better support. -No imaging or referral is needed at this time. -A handout on metatarsalgia was provided.  RHEUMATOID ARTHRITIS: You have rheumatoid arthritis, primarily affecting your shoulders. -Continue your current management with Enbrel.  GENERAL HEALTH MAINTENANCE: Routine wellness visit with vaccinations and colonoscopy up to date. -Blood work was ordered, including blood counts, A1c, cholesterol, B12, and PSA. -Your prostate exam was normal. -Continue your current exercise routine and weight management.  Return in about 1 year (around 04/16/2025) for Annual Physical.   Take care, Dr Kennyth  PLEASE NOTE:  If you had any lab tests, please let us  know if you have not heard back within a few days. You may see your results on mychart before we have a chance to review them but we will give you a call once they are reviewed by us .   If we ordered any referrals today, please let us  know if you have not heard from their office within the next week.   If you had any urgent prescriptions sent in today, please check with the pharmacy within an hour of our visit to make sure the prescription was transmitted appropriately.   Please try these tips to maintain a healthy lifestyle:  Eat at least 3 REAL meals and 1-2 snacks per day.  Aim for no more than 5 hours between eating.  If you eat breakfast, please do so within one hour of getting up.   Each meal should contain half fruits/vegetables, one quarter protein, and one  quarter carbs (no bigger than a computer mouse)  Cut down on sweet beverages. This includes juice, soda, and sweet tea.   Drink at least 1 glass of water with each meal and aim for at least 8 glasses per day  Exercise at least 150 minutes every week.    Preventive Care 80 Years and Older, Male Preventive care refers to lifestyle choices and visits with your health care provider that can promote health and wellness. Preventive care visits are also called wellness exams. What can I expect for my preventive care visit? Counseling During your preventive care visit, your health care provider may ask about your: Medical history, including: Past medical problems. Family medical history. History of falls. Current health, including: Emotional well-being. Home life and relationship well-being. Sexual activity. Memory and ability to understand (cognition). Lifestyle, including: Alcohol, nicotine or tobacco, and drug use. Access to firearms. Diet, exercise, and sleep habits. Work and work astronomer. Sunscreen use. Safety issues such as seatbelt and bike helmet use. Physical exam Your health care provider will check your: Height and weight. These may be used to calculate your BMI (body mass index). BMI is a measurement that tells if you are at a healthy weight. Waist circumference. This measures the distance around your waistline. This measurement also tells if you are at a healthy weight and may help predict your risk of certain diseases, such as type 2 diabetes and high blood pressure. Heart rate and blood pressure. Body temperature. Skin for abnormal spots. What immunizations do I need?  Vaccines are  usually given at various ages, according to a schedule. Your health care provider will recommend vaccines for you based on your age, medical history, and lifestyle or other factors, such as travel or where you work. What tests do I need? Screening Your health care provider may recommend  screening tests for certain conditions. This may include: Lipid and cholesterol levels. Diabetes screening. This is done by checking your blood sugar (glucose) after you have not eaten for a while (fasting). Hepatitis C test. Hepatitis B test. HIV (human immunodeficiency virus) test. STI (sexually transmitted infection) testing, if you are at risk. Lung cancer screening. Colorectal cancer screening. Prostate cancer screening. Abdominal aortic aneurysm (AAA) screening. You may need this if you are a current or former smoker. Talk with your health care provider about your test results, treatment options, and if necessary, the need for more tests. Follow these instructions at home: Eating and drinking  Eat a diet that includes fresh fruits and vegetables, whole grains, lean protein, and low-fat dairy products. Limit your intake of foods with high amounts of sugar, saturated fats, and salt. Take vitamin and mineral supplements as recommended by your health care provider. Do not drink alcohol if your health care provider tells you not to drink. If you drink alcohol: Limit how much you have to 0-2 drinks a day. Know how much alcohol is in your drink. In the [place] one drink equals one 12 oz bottle of beer (355 mL), one 5 oz glass of wine (148 mL), or one 1 oz glass of hard liquor (44 mL). Lifestyle Brush your teeth every morning and night with fluoride toothpaste. Floss one time each day. Exercise for at least 30 minutes 5 or more days each week. Do not use any products that contain nicotine or tobacco. These products include cigarettes, chewing tobacco, and vaping devices, such as e-cigarettes. If you need help quitting, ask your health care provider. Do not use drugs. If you are sexually active, practice safe sex. Use a condom or other form of protection to prevent STIs. Take aspirin  only as told by your health care provider. Make sure that you understand how much to take and what form to  take. Work with your health care provider to find out whether it is safe and beneficial for you to take aspirin  daily. Ask your health care provider if you need to take a cholesterol-lowering medicine (statin). Find healthy ways to manage stress, such as: Meditation, yoga, or listening to music. Journaling. Talking to a trusted person. Spending time with friends and family. Safety Always wear your seat belt while driving or riding in a vehicle. Do not drive: If you have been drinking alcohol. Do not ride with someone who has been drinking. When you are tired or distracted. While texting. If you have been using any mind-altering substances or drugs. Wear a helmet and other protective equipment during sports activities. If you have firearms in your house, make sure you follow all gun safety procedures. Minimize exposure to UV radiation to reduce your risk of skin cancer. What's next? Visit your health care provider once a year for an annual wellness visit. Ask your health care provider how often you should have your eyes and teeth checked. Stay up to date on all vaccines. This information is not intended to replace advice given to you by your health care provider. Make sure you discuss any questions you have with your health care provider. Document Revised: 11/05/2020 Document Reviewed: 11/05/2020 Elsevier Patient Education  2024  [employer].

## 2024-04-16 NOTE — Assessment & Plan Note (Signed)
 On trazodone  as needed.  Does not need refill today.

## 2024-04-16 NOTE — Assessment & Plan Note (Signed)
 Mild BPH.  Check PSA.  DRE today without significant abnormalities.  Symptoms are overall manageable.

## 2024-04-18 ENCOUNTER — Ambulatory Visit: Payer: Self-pay | Admitting: Family Medicine

## 2024-04-18 NOTE — Progress Notes (Signed)
 Labs are all stable.  Do not need to make any adjustments to treatment plan at this time.  He should continue to work on diet and exercise and we can recheck everything in a year or so.

## 2024-04-20 ENCOUNTER — Other Ambulatory Visit: Payer: Self-pay | Admitting: Internal Medicine

## 2024-05-04 ENCOUNTER — Ambulatory Visit: Attending: Internal Medicine | Admitting: Internal Medicine

## 2024-05-04 ENCOUNTER — Ambulatory Visit: Attending: Internal Medicine

## 2024-05-04 ENCOUNTER — Encounter: Payer: Self-pay | Admitting: Internal Medicine

## 2024-05-04 ENCOUNTER — Telehealth: Payer: Self-pay | Admitting: Internal Medicine

## 2024-05-04 VITALS — BP 122/82 | HR 55 | Ht 71.5 in | Wt 207.0 lb

## 2024-05-04 DIAGNOSIS — I480 Paroxysmal atrial fibrillation: Secondary | ICD-10-CM | POA: Diagnosis not present

## 2024-05-04 NOTE — Telephone Encounter (Signed)
 Patient will return monitor on 05/18/24

## 2024-05-04 NOTE — Patient Instructions (Signed)
 Medication Instructions:   Your physician recommends that you continue on your current medications as directed. Please refer to the Current Medication list given to you today.   Labwork:  None today  Testing/Procedures: ZIO XT- Long Term Monitor Instructions   Your physician has requested you wear your ZIO patch monitor___14____days.   This is a single patch monitor.  [employer] supplies one patch monitor per enrollment.  Additional stickers are not available.       Do not shower for the first 24 hours.  You may shower after the first 24 hours.   Press button if you feel a symptom. You will hear a small click.  Record Date, Time and Symptom in the Patient Log Book.   When you are ready to remove patch, follow instructions on last 2 pages of Patient Log Book.  Stick patch monitor onto last page of Patient Log Book.   Place Patient Log Book in [place] box.  Use locking tab on box and tape box closed securely.  The [employer] and [employer] has [employer] on it.  Please place in mailbox as soon as possible.  Your physician should have your test results approximately 7 days after the monitor has been mailed back to [facility].   Call [facility] Customer Care at [phone] if you have questions regarding your ZIO XT patch monitor.  Call them immediately if you see an orange light blinking on your monitor.   If your monitor falls off in less than 4 days contact our Monitor department at [phone].  If your monitor becomes loose or falls off after 4 days call [employer] at [phone] for suggestions on securing your monitor.    Follow-Up: February 2026 Dr.Carlisle  Any Other Special Instructions Will Be Listed Below (If Applicable).  If you need a refill on your cardiac medications before your next appointment, please call your pharmacy.

## 2024-05-04 NOTE — Telephone Encounter (Signed)
 Pt calling because he got his monitor on today and the nurse put 12/14 as the start date. Please advise.

## 2024-05-04 NOTE — Progress Notes (Signed)
 HPI William West returns today for followup. He has a h/o PAF, for which he is minimally symptomatic. He also has a h/o unknown GI bleeding presenting as melena and a negative workup. He also had gallstones and removal. He has not been on anti-coag as his CHADSVASC is one. He denies chest pain or sob.  Allergies[1]   Current Outpatient Medications  Medication Sig Dispense Refill   acetaminophen  (TYLENOL ) 160 MG/5ML liquid Take 325 mg by mouth every 4 (four) hours as needed for pain.     azelastine  (ASTELIN ) 0.1 % nasal spray Place 2 sprays into both nostrils 2 (two) times daily. 30 mL 12   Cyanocobalamin  (B-12 PO) Take by mouth.     diltiazem  (CARDIZEM  CD) 120 MG 24 hr capsule Take 1 capsule by mouth once daily 90 capsule 0   docusate sodium (COLACE) 100 MG capsule Take 100 mg by mouth as needed.      etanercept (ENBREL) 50 MG/ML injection Inject 50 mg into the skin every Monday.      famotidine  (PEPCID ) 20 MG tablet TAKE 1 TABLET BY MOUTH TWICE DAILY AS NEEDED FOR  HEARTBURN  OR  INDIGESTION.  FOR  BREAK  THROUGH  OF  ACID  REFLUX 180 tablet 0   ibuprofen (ADVIL,MOTRIN) 200 MG tablet Take 600 mg by mouth every 8 (eight) hours as needed for moderate pain.      LORazepam  (ATIVAN ) 1 MG tablet Take 1 tablet (1 mg total) by mouth at bedtime. 30 tablet 5   magnesium  gluconate (MAGONATE) 500 (27 Mg) MG TABS tablet Take 250 mg by mouth daily.     melatonin 5 MG TABS Take 5 mg by mouth.     Multiple Vitamin (MULTIVITAMIN) tablet Take 1 tablet by mouth daily.     neomycin -bacitracin-polymyxin (NEOSPORIN) ointment Apply 1 application topically as needed for wound care.     omeprazole  (PRILOSEC) 20 MG capsule Take 1 capsule (20 mg total) by mouth daily. 90 capsule 3   Probiotic Product (PROBIOTIC-10 ULTIMATE) CAPS Take by mouth as needed.     traZODone  (DESYREL ) 50 MG tablet TAKE 1 TO 2 TABLETS BY MOUTH AT BEDTIME AS NEEDED FOR SLEEP 90 tablet 0   No current facility-administered medications  for this visit.     Past Medical History:  Diagnosis Date   A-fib (HCC)    Acute blood loss anemia 01/26/2015   Allergic rhinitis    Diverticulosis of colon    Dysplastic nevus    GERD (gastroesophageal reflux disease)    GI bleeding 01/24/2015   Hypercholesterolemia    Postural dizziness with near syncope due to dehydration 12/09/2016   RA (rheumatoid arthritis) (HCC)     ROS:   All systems reviewed and negative except as noted in the HPI.   Past Surgical History:  Procedure Laterality Date   COLONOSCOPY  01/24/2015   x 3   dental implant     ERCP N/A 02/18/2015   Procedure: ENDOSCOPIC RETROGRADE CHOLANGIOPANCREATOGRAPHY (ERCP);  Surgeon: Belvie Just, MD;  Location: [facility];  Service: Endoscopy;  Laterality: N/A;   GIVENS CAPSULE STUDY N/A 01/24/2015   Procedure: GIVENS CAPSULE STUDY;  Surgeon: Renaye Sous, MD;  Location: [facility];  Service: Endoscopy;  Laterality: N/A;   INGUINAL HERNIA REPAIR Right 07/27/2019   Procedure: LAPAROSCOPIC RIGHT INGUINAL HERNIA REPAIR WITH MESH;  Surgeon: Rubin Calamity, MD;  Location: [facility] OR;  Service: General;  Laterality: Right;   LAPAROSCOPIC CHOLECYSTECTOMY  2011   TOE  AMPUTATION  1966   TONSILLECTOMY AND ADENOIDECTOMY       Family History  Problem Relation Age of Onset   Heart disease Mother    Rheum arthritis Mother    Prostate cancer Father    Diabetes Son    Colon cancer Neg Hx    Esophageal cancer Neg Hx    Rectal cancer Neg Hx    Stomach cancer Neg Hx      Social History   Socioeconomic History   Marital status: Married    Spouse name: Macario   Number of children: 2   Years of education: Not on file   Highest education level: Not on file  Occupational History   Occupation: [employer]    Comment: RETIRED  Tobacco Use   Smoking status: Former    Current packs/day: 0.00    Average packs/day: 0.5 packs/day for 45.0 years (22.5 ttl pk-yrs)    Types: Cigarettes    Start date: 11/13/1964    Quit date: 11/13/2009     Years since quitting: 14.4   Smokeless tobacco: Never  Vaping Use   Vaping status: Never Used  Substance and Sexual Activity   Alcohol use: No    Alcohol/week: 0.0 standard drinks of alcohol   Drug use: No   Sexual activity: Never  Other Topics Concern   Not on file  Social History Narrative   One son with HBP. One son with IDDM. Exercises regularly at the [facility]. Eats well. Down 30 pounds with the lifestyle change. Exercise keeps him from feeling so stiff. Weighs daily. Baseline 199-204.    Social Drivers of Health   Tobacco Use: Medium Risk (05/04/2024)   Patient History    Smoking Tobacco Use: Former    Smokeless Tobacco Use: Never    Passive Exposure: Not on file  Financial Resource Strain: Low Risk (04/30/2022)   Overall Financial Resource Strain (CARDIA)    Difficulty of Paying Living Expenses: Not hard at all  Food Insecurity: No Food Insecurity (04/30/2022)   Hunger Vital Sign    Worried About Running Out of Food in the Last Year: Never true    Ran Out of Food in the Last Year: Never true  Transportation Needs: No Transportation Needs (04/30/2022)   PRAPARE - Administrator, Civil Service (Medical): No    Lack of Transportation (Non-Medical): No  Physical Activity: Sufficiently Active (04/30/2022)   Exercise Vital Sign    Days of Exercise per Week: 4 days    Minutes of Exercise per Session: 60 min  Stress: No Stress Concern Present (05/09/2023)   [employer] of Occupational Health - Occupational Stress Questionnaire    Feeling of Stress : Not at all  Social Connections: Socially Integrated (04/30/2022)   Social Connection and Isolation Panel    Frequency of Communication with Friends and Family: More than three times a week    Frequency of Social Gatherings with Friends and Family: More than three times a week    Attends Religious Services: More than 4 times per year    Active Member of [employer] or Organizations: Yes    Attends Banker  Meetings: 1 to 4 times per year    Marital Status: Married  Catering Manager Violence: Not At Risk (04/30/2022)   Humiliation, Afraid, Rape, and Kick questionnaire    Fear of Current or Ex-Partner: No    Emotionally Abused: No    Physically Abused: No    Sexually Abused: No  Depression (  PHQ2-9): Low Risk (04/16/2024)   Depression (PHQ2-9)    PHQ-2 Score: 0  Alcohol Screen: Not on file  Housing: Low Risk (04/30/2022)   Housing    Last Housing Risk Score: 0  Utilities: Not on file  Health Literacy: Not on file     BP 122/82 (BP Location: Right Arm)   Pulse (!) 55   Ht 5' 11.5 (1.816 m)   Wt 207 lb (93.9 kg)   SpO2 100%   BMI 28.47 kg/m   Physical Exam:  Well appearing NAD HEENT: Unremarkable Neck:  No JVD, no thyromegally Lymphatics:  No adenopathy Back:  No CVA tenderness Lungs:  Clear with no wheezes HEART:  Regular rate rhythm, no murmurs, no rubs, no clicks Abd:  soft, positive bowel sounds, no organomegally, no rebound, no guarding Ext:  2 plus pulses, no edema, no cyanosis, no clubbing Skin:  No rashes no nodules Neuro:  CN II through XII intact, motor grossly intact   Assess/Plan: PAF - as he is minimally symptomatic, I'll have him wear a 14 day zio to assess his burden and rates.  Coags - no indication for anti-coag. CHADSVASC is one. However he may ultimately need to consider a Watchman at 76.   Danelle Emmit Oriley,MD    [1]  Allergies Allergen Reactions   Caffeine Other (See Comments)    Causes him to go into afib   Flecainide  Other (See Comments)    Caused heart to race too fast and had him in the ED   Methotrexate And Trimetrexate Nausea And Vomiting    Increased liver enzymes   Pseudoephedrine-Ibuprofen Other (See Comments)     afib    Penicillins Rash    Did it involve swelling of the face/tongue/throat, SOB, or low BP? No Did it involve sudden or severe rash/hives, skin peeling, or any reaction on the inside of your mouth or nose? No Did you  need to seek medical attention at a hospital or doctor's office? No When did it last happen?      40 + years If all above answers are NO, may proceed with cephalosporin use.

## 2024-05-16 ENCOUNTER — Ambulatory Visit

## 2024-05-16 VITALS — Ht 71.5 in | Wt 199.5 lb

## 2024-05-16 DIAGNOSIS — Z0000 Encounter for general adult medical examination without abnormal findings: Secondary | ICD-10-CM | POA: Diagnosis not present

## 2024-05-16 NOTE — Patient Instructions (Signed)
 [name],  Thank you for taking the time for your Medicare Wellness Visit. I appreciate your continued commitment to your health goals. Please review the care plan we discussed, and feel free to reach out if I can assist you further.  Please note that Annual Wellness Visits do not include a physical exam. Some assessments may be limited, especially if the visit was conducted virtually. If needed, we may recommend an in-person follow-up with your provider.  Ongoing Care Seeing your primary care provider every 3 to 6 months helps us  monitor your health and provide consistent, personalized care.   Referrals If a referral was made during today's visit and you haven't received any updates within two weeks, please contact the referred provider directly to check on the status.  Recommended Screenings:  Health Maintenance  Topic Date Due   Screening for Lung Cancer  09/19/2022   COVID-19 Vaccine (8 - Pfizer risk 2025-26 season) 08/12/2024   Medicare Annual Wellness Visit  05/16/2025   DTaP/Tdap/Td vaccine (5 - Td or Tdap) 03/30/2028   Colon Cancer Screening  07/23/2028   Pneumococcal Vaccine for age over 58  Completed   Flu Shot  Completed   Hepatitis C Screening  Completed   Zoster (Shingles) Vaccine  Completed   Meningitis B Vaccine  Aged Out       05/16/2024    7:54 AM  Advanced Directives  Does Patient Have a Medical Advance Directive? Yes  Type of Advance Directive Healthcare Power of Attorney  Copy of Healthcare Power of Attorney in Chart? No - copy requested    Vision: Annual vision screenings are recommended for early detection of glaucoma, cataracts, and diabetic retinopathy. These exams can also reveal signs of chronic conditions such as diabetes and high blood pressure.  Dental: Annual dental screenings help detect early signs of oral cancer, gum disease, and other conditions linked to overall health, including heart disease and diabetes.  Please see the attached  documents for additional preventive care recommendations.

## 2024-05-16 NOTE — Progress Notes (Signed)
 "  Chief Complaint  Patient presents with   Medicare Wellness     Subjective:   William West is a 73 y.o. male who presents for a Medicare Annual Wellness Visit.  Visit info / Clinical Intake: Medicare Wellness Visit Type:: Subsequent Annual Wellness Visit Persons participating in visit and providing information:: patient Medicare Wellness Visit Mode:: Telephone If telephone:: video declined Since this visit was completed virtually, some vitals may be partially provided or unavailable. Missing vitals are due to the limitations of the virtual format.: Unable to obtain vitals - no equipment If Telephone or Video please confirm:: I connected with patient using audio/video enable telemedicine. I verified patient identity with two identifiers, discussed telehealth limitations, and patient agreed to proceed. Patient Location:: home Provider Location:: home office Interpreter Needed?: No Pre-visit prep was completed: yes AWV questionnaire completed by patient prior to visit?: no Living arrangements:: lives with spouse/significant other Patient's Overall Health Status Rating: very good Typical amount of pain: some (feet pain) Does pain affect daily life?: no Are you currently prescribed opioids?: no  Dietary Habits and Nutritional Risks How many meals a day?: 3 Eats fruit and vegetables daily?: yes Most meals are obtained by: preparing own meals In the last 2 weeks, have you had any of the following?: none Diabetic:: no  Functional Status Activities of Daily Living (to include ambulation/medication): Independent Ambulation: Independent with device- listed below Home Assistive Devices/Equipment: Eyeglasses Medication Administration: Independent Home Management (perform basic housework or laundry): Independent Manage your own finances?: yes Primary transportation is: driving Concerns about vision?: no *vision screening is required for WTM* Concerns about hearing?: no  Fall  Screening Falls in the past year?: 0 Number of falls in past year: 0 Was there an injury with Fall?: 0 Fall Risk Category Calculator: 0 Patient Fall Risk Level: Low Fall Risk  Fall Risk Patient at Risk for Falls Due to: No Fall Risks Fall risk Follow up: Falls evaluation completed  Home and Transportation Safety: All rugs have non-skid backing?: N/A, no rugs All stairs or steps have railings?: N/A, no stairs Grab bars in the bathtub or shower?: (!) no Have non-skid surface in bathtub or shower?: yes Good home lighting?: yes Regular seat belt use?: yes Hospital stays in the last year:: no  Cognitive Assessment Difficulty concentrating, remembering, or making decisions? : no Will 6CIT or Mini Cog be Completed: yes What year is it?: 0 points What month is it?: 0 points Give patient an address phrase to remember (5 components): [address] [place]  About what time is it?: 0 points Count backwards from 20 to 1: 0 points Say the months of the year in reverse: 0 points Repeat the address phrase from earlier: 0 points 6 CIT Score: 0 points  Advance Directives (For Healthcare) Does Patient Have a Medical Advance Directive?: Yes Type of Advance Directive: Healthcare Power of Attorney Copy of Healthcare Power of Attorney in Chart?: No - copy requested  Reviewed/Updated  Reviewed/Updated: Reviewed All (Medical, Surgical, Family, Medications, Allergies, Care Teams, Patient Goals)    Allergies (verified) Caffeine, Flecainide , Methotrexate and trimetrexate, Pseudoephedrine-ibuprofen, and Penicillins   Current Medications (verified) Outpatient Encounter Medications as of 05/16/2024  Medication Sig   acetaminophen  (TYLENOL ) 160 MG/5ML liquid Take 325 mg by mouth every 4 (four) hours as needed for pain.   azelastine  (ASTELIN ) 0.1 % nasal spray Place 2 sprays into both nostrils 2 (two) times daily.   Cyanocobalamin  (B-12 PO) Take by mouth.   diltiazem  (CARDIZEM  CD) 120  MG 24 hr  capsule Take 1 capsule by mouth once daily   docusate sodium (COLACE) 100 MG capsule Take 100 mg by mouth as needed.    etanercept (ENBREL) 50 MG/ML injection Inject 50 mg into the skin every Monday.    famotidine  (PEPCID ) 20 MG tablet TAKE 1 TABLET BY MOUTH TWICE DAILY AS NEEDED FOR  HEARTBURN  OR  INDIGESTION.  FOR  BREAK  THROUGH  OF  ACID  REFLUX   ibuprofen (ADVIL,MOTRIN) 200 MG tablet Take 600 mg by mouth every 8 (eight) hours as needed for moderate pain.    LORazepam  (ATIVAN ) 1 MG tablet Take 1 tablet (1 mg total) by mouth at bedtime.   magnesium  gluconate (MAGONATE) 500 (27 Mg) MG TABS tablet Take 250 mg by mouth daily.   melatonin 5 MG TABS Take 5 mg by mouth.   Multiple Vitamin (MULTIVITAMIN) tablet Take 1 tablet by mouth daily.   neomycin -bacitracin-polymyxin (NEOSPORIN) ointment Apply 1 application topically as needed for wound care.   omeprazole  (PRILOSEC) 20 MG capsule Take 1 capsule (20 mg total) by mouth daily.   Probiotic Product (PROBIOTIC-10 ULTIMATE) CAPS Take by mouth as needed.   traZODone  (DESYREL ) 50 MG tablet TAKE 1 TO 2 TABLETS BY MOUTH AT BEDTIME AS NEEDED FOR SLEEP   No facility-administered encounter medications on file as of 05/16/2024.    History: Past Medical History:  Diagnosis Date   A-fib (HCC)    Acute blood loss anemia 01/26/2015   Allergic rhinitis    Diverticulosis of colon    Dysplastic nevus    GERD (gastroesophageal reflux disease)    GI bleeding 01/24/2015   Hypercholesterolemia    Postural dizziness with near syncope due to dehydration 12/09/2016   RA (rheumatoid arthritis) [facility])    Past Surgical History:  Procedure Laterality Date   COLONOSCOPY  01/24/2015   x 3   dental implant     ERCP N/A 02/18/2015   Procedure: ENDOSCOPIC RETROGRADE CHOLANGIOPANCREATOGRAPHY (ERCP);  Surgeon: Belvie Just, MD;  Location: [facility];  Service: Endoscopy;  Laterality: N/A;   GIVENS CAPSULE STUDY N/A 01/24/2015   Procedure: GIVENS CAPSULE STUDY;   Surgeon: Renaye Sous, MD;  Location[facility] ENDOSCOPY;  Service: Endoscopy;  Laterality: N/A;   INGUINAL HERNIA REPAIR Right 07/27/2019   Procedure: LAPAROSCOPIC RIGHT INGUINAL HERNIA REPAIR WITH MESH;  Surgeon: Rubin Calamity, MD;  Location: [facility] OR;  Service: General;  Laterality: Right;   LAPAROSCOPIC CHOLECYSTECTOMY  2011   TOE AMPUTATION  1966   TONSILLECTOMY AND ADENOIDECTOMY     Family History  Problem Relation Age of Onset   Heart disease Mother    Rheum arthritis Mother    Prostate cancer Father    Diabetes Son    Colon cancer Neg Hx    Esophageal cancer Neg Hx    Rectal cancer Neg Hx    Stomach cancer Neg Hx    Social History   Occupational History   Occupation: [employer]    Comment: RETIRED  Tobacco Use   Smoking status: Former    Current packs/day: 0.00    Average packs/day: 0.5 packs/day for 45.0 years (22.5 ttl pk-yrs)    Types: Cigarettes    Start date: 11/13/1964    Quit date: 11/13/2009    Years since quitting: 14.5   Smokeless tobacco: Never  Vaping Use   Vaping status: Never Used  Substance and Sexual Activity   Alcohol use: No    Alcohol/week: 0.0 standard drinks of alcohol  Drug use: No   Sexual activity: Never   Tobacco Counseling Counseling given: Not Answered  SDOH Screenings   Food Insecurity: No Food Insecurity (05/16/2024)  Housing: Low Risk (05/16/2024)  Transportation Needs: No Transportation Needs (05/16/2024)  Utilities: Not At Risk (05/16/2024)  Alcohol Screen: Low Risk (05/16/2024)  Depression (PHQ2-9): Low Risk (05/16/2024)  Financial Resource Strain: Low Risk (04/30/2022)  Physical Activity: Sufficiently Active (05/16/2024)  Social Connections: Socially Integrated (05/16/2024)  Stress: No Stress Concern Present (05/16/2024)  Tobacco Use: Medium Risk (05/16/2024)  Health Literacy: Adequate Health Literacy (05/16/2024)   See flowsheets for full screening details  Depression Screen PHQ 2 & 9 Depression Scale- Over the past 2 weeks,  how often have you been bothered by any of the following problems? Little interest or pleasure in doing things: 0 Feeling down, depressed, or hopeless (PHQ Adolescent also includes...irritable): 0 PHQ-2 Total Score: 0     Goals Addressed               This Visit's Progress     maintain health and activity (pt-stated)        Maintain health and activity              Objective:    Today's Vitals   05/16/24 0752  Weight: 199 lb 8 oz (90.5 kg)  Height: 5' 11.5 (1.816 m)   Body mass index is 27.44 kg/m.  Hearing/Vision screen Hearing Screening - Comments:: Pt denies hearing issues  Vision Screening - Comments:: Wears rx glasses - up to date with routine eye exams with Dr Darroll  Immunizations and Health Maintenance Health Maintenance  Topic Date Due   Lung Cancer Screening  09/19/2022   COVID-19 Vaccine (8 - Pfizer risk 2025-26 season) 08/12/2024   Medicare Annual Wellness (AWV)  05/16/2025   DTaP/Tdap/Td (5 - Td or Tdap) 03/30/2028   Colonoscopy  07/23/2028   Pneumococcal Vaccine: 50+ Years  Completed   Influenza Vaccine  Completed   Hepatitis C Screening  Completed   Zoster Vaccines- Shingrix  Completed   Meningococcal B Vaccine  Aged Out        Assessment/Plan:  This is a routine wellness examination for Mert.  Patient Care Team: Kennyth Worth HERO, MD as PCP - General (Family Medicine) Mai Lynwood FALCON, MD as Consulting Physician (Rheumatology) Kristie Lamprey, MD as Consulting Physician (Gastroenterology) Pandora Cadet, [facility] as Pharmacist (Pharmacist)  I have personally reviewed and noted the following in the patients chart:   Medical and social history Use of alcohol, tobacco or illicit drugs  Current medications and supplements including opioid prescriptions. Functional ability and status Nutritional status Physical activity Advanced directives List of other physicians Hospitalizations, surgeries, and ER visits in previous 12  months Vitals Screenings to include cognitive, depression, and falls Referrals and appointments  No orders of the defined types were placed in this encounter.  In addition, I have reviewed and discussed with patient certain preventive protocols, quality metrics, and best practice recommendations. A written personalized care plan for preventive services as well as general preventive health recommendations were provided to patient.   Ellouise VEAR Haws, LPN   [id number]   Return in about 1 year (around 05/20/2025).  After Visit Summary: (MyChart) Due to this being a telephonic visit, the after visit summary with patients personalized plan was offered to patient via MyChart   Nurse Notes: No voiced or noted concerns at this time  "

## 2024-05-22 DIAGNOSIS — I480 Paroxysmal atrial fibrillation: Secondary | ICD-10-CM

## 2024-05-23 ENCOUNTER — Ambulatory Visit: Payer: Self-pay | Admitting: Internal Medicine

## 2024-05-25 NOTE — Telephone Encounter (Signed)
 Patient walked in needs results from his monitor

## 2024-05-25 NOTE — Telephone Encounter (Signed)
"  Patient is returning call  "

## 2024-05-27 ENCOUNTER — Encounter: Payer: Self-pay | Admitting: Internal Medicine

## 2024-05-28 ENCOUNTER — Other Ambulatory Visit: Payer: Self-pay | Admitting: Family Medicine

## 2024-07-20 ENCOUNTER — Ambulatory Visit: Admitting: Student in an Organized Health Care Education/Training Program

## 2024-09-03 ENCOUNTER — Other Ambulatory Visit

## 2025-04-22 ENCOUNTER — Ambulatory Visit: Admitting: Family Medicine

## 2025-05-21 ENCOUNTER — Ambulatory Visit
